# Patient Record
Sex: Male | Born: 1950 | Race: White | Hispanic: No | Marital: Married | Smoking: Former smoker
Health system: Southern US, Community
[De-identification: ages and names within clinical notes are randomized; demographics above are authoritative.]

## PROBLEM LIST (undated history)

## (undated) DIAGNOSIS — Z8739 Personal history of other diseases of the musculoskeletal system and connective tissue: Secondary | ICD-10-CM

## (undated) DIAGNOSIS — I739 Peripheral vascular disease, unspecified: Secondary | ICD-10-CM

## (undated) DIAGNOSIS — E785 Hyperlipidemia, unspecified: Secondary | ICD-10-CM

## (undated) DIAGNOSIS — I779 Disorder of arteries and arterioles, unspecified: Secondary | ICD-10-CM

## (undated) DIAGNOSIS — M4035 Flatback syndrome, thoracolumbar region: Secondary | ICD-10-CM

## (undated) DIAGNOSIS — I2510 Atherosclerotic heart disease of native coronary artery without angina pectoris: Secondary | ICD-10-CM

## (undated) DIAGNOSIS — Z8673 Personal history of transient ischemic attack (TIA), and cerebral infarction without residual deficits: Secondary | ICD-10-CM

## (undated) DIAGNOSIS — M48061 Spinal stenosis, lumbar region without neurogenic claudication: Secondary | ICD-10-CM

## (undated) DIAGNOSIS — I4892 Unspecified atrial flutter: Secondary | ICD-10-CM

## (undated) DIAGNOSIS — I480 Paroxysmal atrial fibrillation: Secondary | ICD-10-CM

## (undated) HISTORY — DX: Hyperlipidemia, unspecified: E78.5

## (undated) HISTORY — PX: CARDIAC CATHETERIZATION: SHX172

## (undated) HISTORY — DX: Peripheral vascular disease, unspecified: I73.9

## (undated) HISTORY — PX: BILATERAL CARPAL TUNNEL RELEASE: SHX6508

## (undated) HISTORY — DX: Atherosclerotic heart disease of native coronary artery without angina pectoris: I25.10

## (undated) HISTORY — DX: Unspecified atrial flutter: I48.92

## (undated) HISTORY — DX: Flatback syndrome, thoracolumbar region: M40.35

## (undated) HISTORY — DX: Spinal stenosis, lumbar region without neurogenic claudication: M48.061

## (undated) HISTORY — DX: Paroxysmal atrial fibrillation: I48.0

## (undated) HISTORY — DX: Personal history of other diseases of the musculoskeletal system and connective tissue: Z87.39

## (undated) HISTORY — DX: Personal history of transient ischemic attack (TIA), and cerebral infarction without residual deficits: Z86.73

## (undated) HISTORY — DX: Disorder of arteries and arterioles, unspecified: I77.9

## (undated) NOTE — *Deleted (*Deleted)
Patient case delayed due to emergent bring back per Dr. Zenaida Niece Trigt's order.

---

## 2008-06-11 ENCOUNTER — Ambulatory Visit: Payer: Self-pay | Admitting: Rheumatology

## 2009-01-19 ENCOUNTER — Inpatient Hospital Stay: Payer: Self-pay | Admitting: Internal Medicine

## 2009-11-29 DIAGNOSIS — I2510 Atherosclerotic heart disease of native coronary artery without angina pectoris: Secondary | ICD-10-CM

## 2009-11-29 HISTORY — PX: CORONARY ANGIOPLASTY WITH STENT PLACEMENT: SHX49

## 2009-11-29 HISTORY — DX: Atherosclerotic heart disease of native coronary artery without angina pectoris: I25.10

## 2010-08-07 ENCOUNTER — Inpatient Hospital Stay: Payer: Self-pay | Admitting: Internal Medicine

## 2010-08-11 ENCOUNTER — Inpatient Hospital Stay: Payer: Self-pay | Admitting: Internal Medicine

## 2010-09-21 ENCOUNTER — Ambulatory Visit: Payer: Self-pay | Admitting: Internal Medicine

## 2011-02-22 ENCOUNTER — Other Ambulatory Visit: Payer: Self-pay | Admitting: Rheumatology

## 2014-01-28 ENCOUNTER — Ambulatory Visit: Payer: Self-pay

## 2014-06-05 DIAGNOSIS — M48061 Spinal stenosis, lumbar region without neurogenic claudication: Secondary | ICD-10-CM | POA: Diagnosis present

## 2014-06-05 DIAGNOSIS — M4035 Flatback syndrome, thoracolumbar region: Secondary | ICD-10-CM | POA: Insufficient documentation

## 2015-02-25 ENCOUNTER — Ambulatory Visit: Payer: Self-pay | Admitting: Internal Medicine

## 2015-03-27 DIAGNOSIS — M1990 Unspecified osteoarthritis, unspecified site: Secondary | ICD-10-CM | POA: Diagnosis present

## 2015-03-27 DIAGNOSIS — Z8739 Personal history of other diseases of the musculoskeletal system and connective tissue: Secondary | ICD-10-CM | POA: Insufficient documentation

## 2015-03-27 DIAGNOSIS — I10 Essential (primary) hypertension: Secondary | ICD-10-CM | POA: Diagnosis present

## 2015-03-27 DIAGNOSIS — Z8673 Personal history of transient ischemic attack (TIA), and cerebral infarction without residual deficits: Secondary | ICD-10-CM | POA: Insufficient documentation

## 2015-04-01 ENCOUNTER — Ambulatory Visit (INDEPENDENT_AMBULATORY_CARE_PROVIDER_SITE_OTHER): Payer: BLUE CROSS/BLUE SHIELD | Admitting: Cardiovascular Disease

## 2015-04-01 ENCOUNTER — Encounter: Payer: Self-pay | Admitting: Cardiovascular Disease

## 2015-04-01 VITALS — BP 142/88 | HR 115 | Ht 71.0 in | Wt 244.0 lb

## 2015-04-01 DIAGNOSIS — I6523 Occlusion and stenosis of bilateral carotid arteries: Secondary | ICD-10-CM

## 2015-04-01 DIAGNOSIS — M5441 Lumbago with sciatica, right side: Secondary | ICD-10-CM | POA: Diagnosis not present

## 2015-04-01 DIAGNOSIS — M5442 Lumbago with sciatica, left side: Secondary | ICD-10-CM

## 2015-04-01 DIAGNOSIS — I6529 Occlusion and stenosis of unspecified carotid artery: Secondary | ICD-10-CM | POA: Insufficient documentation

## 2015-04-01 DIAGNOSIS — I482 Chronic atrial fibrillation, unspecified: Secondary | ICD-10-CM | POA: Insufficient documentation

## 2015-04-01 DIAGNOSIS — I2510 Atherosclerotic heart disease of native coronary artery without angina pectoris: Secondary | ICD-10-CM

## 2015-04-01 DIAGNOSIS — Z125 Encounter for screening for malignant neoplasm of prostate: Secondary | ICD-10-CM | POA: Diagnosis not present

## 2015-04-01 DIAGNOSIS — M545 Low back pain, unspecified: Secondary | ICD-10-CM | POA: Insufficient documentation

## 2015-04-01 DIAGNOSIS — I4891 Unspecified atrial fibrillation: Secondary | ICD-10-CM

## 2015-04-01 DIAGNOSIS — E785 Hyperlipidemia, unspecified: Secondary | ICD-10-CM | POA: Diagnosis not present

## 2015-04-01 MED ORDER — DILTIAZEM HCL 30 MG PO TABS: 30.0000 mg | ORAL_TABLET | Freq: Three times a day (TID) | ORAL | Status: DC

## 2015-04-01 NOTE — Assessment & Plan Note (Addendum)
Currently is relatively asymptomatic despite being in atrial fibrillation with rate over 100 bpm. Denies heart failure symptoms and appears relatively euvolemic on today's visit. We have suggested he try other medications for rate control before he consider ablation. He would likely be high risk of recurrent atrial fibrillation given dilated left atrium, moderate MR. Unable to exclude sleep apnea. We have recommended he start diltiazem 30 mg 3 times a day if tolerated. If tolerated, could change to extended release Cardizem 120 mg daily If unable to tolerate calcium channel blocker, could try digoxin. Recommended he stay on anticoagulation given prior history of TIA

## 2015-04-01 NOTE — Assessment & Plan Note (Signed)
He has follow-up at [facility] back clinic. Unclear. Would be a surgical candidate given general anesthesia could potentially lead to hypotension and given his bilateral carotid disease, increased risk of stroke or TIA. Unclear if epidural could be considered if surgery possible.

## 2015-04-01 NOTE — Progress Notes (Addendum)
Patient ID: Jeremy Reese, male    DOB: 02-06-1951, 64 y.o.   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 Lucky Cowboy, atrial fibrillation dating back to 2011 who presents for new patient evaluation for his atrial fibrillation. He reports having severe back pain and is not a surgical candidate per the surgeons given his severe bilateral carotid disease. He has a history of coronary artery disease, cardiac catheterization 01/20/2009 with 95% proximal circumflex, 60% mid LAD. PCI of the left circumflex vessel  In terms of his atrial fibrillation history, briefly, he reports having initial onset of atrial fibrillation in 2011.  He had cardioversion at that time which was relatively unsuccessful in maintaining normal sinus rhythm. He was started on tiksoyn and per his report, converted to normal sinus rhythm. He was maintained on this medication for many years but was found to be in atrial fibrillation in early 2016. He underwent cardioversion but converted back to atrial fibrillation shortly after. He was recommended he start metoprolol 25 mg twice a day.  He reports having significant fatigue, weakness, and shortness of breath and has since stopped all of his cardiac medications except for his anticoagulation and cholesterol medication.  In follow-up today, he reports that he feels well with no significant shortness of breath. In fact he feels the best that he has felt in a long time on little medication.  In the past he has tried higher dose pravastatin 80 mg and had side effects including fatigue, weakness, muscle ache. He is able to tolerate 40 mg daily.  In terms of his other medical issues, he has a "flatback" and per his report has spinal stenosis, scoliosis and chronic pain. He is scheduled to see [facility] back clinic. He reports tramadol helps to some  degree but still has significant breakthrough pain. He is very limited in his ability to exert himself or ambulate secondary to his back.  Carotid ultrasound reviewed with him from 01/14/2015 showing bilateral internal carotid occlusion, no significant change from ultrasound dated June 2015  Prior studies show cardioversion 02/25/2015 Transesophageal echo 08/11/2010 with PFO noted, ejection fraction 50%, left atrium moderately dilated, moderate MR, mild to moderate TR  EKG on today's visit shows atrial fibrillation with ventricular rate 115 bpm, no significant ST or T-wave changes  Allergies  Allergen Reactions  . Propafenone Anxiety  . Codeine Nausea Only  . Penicillins Rash     Current outpatient prescriptions:  .  apixaban (ELIQUIS) 5 MG TABS tablet, Take 5 mg by mouth 2 (two) times daily., Disp: , Rfl:  .  calcium gluconate 500 MG tablet, Take 1 tablet by mouth daily., Disp: , Rfl:  .  colchicine 0.6 MG tablet, Take 0.6 mg by mouth 2 (two) times daily as needed., Disp: , Rfl:  .  Flaxseed, Linseed, (FLAXSEED OIL) 1200 MG CAPS, Take 1,200 mg by mouth 3 (three) times daily., Disp: , Rfl:  .  gabapentin (NEURONTIN) 800 MG tablet, Take 800 mg by mouth 3 (three) times daily., Disp: , Rfl:  .  Magnesium Gluconate 250 MG TABS, Take 250 mg by mouth daily., Disp: , Rfl:  .  Melatonin 5 MG TABS, Take 5 mg by mouth daily., Disp: , Rfl:  .  pravastatin (PRAVACHOL) 40 MG tablet, Take 40 mg by mouth daily., Disp: , Rfl:  .  traMADol (ULTRAM) 50 MG tablet, Take 100  mg by mouth every 6 (six) hours as needed., Disp: , Rfl:  .  diltiazem (CARDIZEM) 30 MG tablet, Take 1 tablet (30 mg total) by mouth 3 (three) times daily., Disp: 90 tablet, Rfl: 6   Past Medical History  Diagnosis Date  . Paroxysmal a-fib   . Carotid artery disease without cerebral infarction   . Coronary artery disease 2011  . History of TIA (transient ischemic attack)   . Flatback syndrome of thoracolumbar region   . H/O  calcium pyrophosphate deposition disease (CPPD)   . Spinal stenosis of lumbar region without neurogenic claudication   . Hyperlipidemia   . Atrial flutter, paroxysmal     Past Surgical History  Procedure Laterality Date  . Cardiac catheterization    . Coronary angioplasty with stent placement  2011  . Bilateral carpal tunnel release    . Circumcision, non-newborn      Social History  reports that he has quit smoking. His smoking use included Cigarettes. He has a 10 pack-year smoking history. He does not have any smokeless tobacco history on file. He reports that he drinks alcohol. He reports that he does not use illicit drugs.  Family History family history includes Heart disease (age of onset: 45) in his mother; Hypertension in his father.  Review of Systems  Constitutional: Negative.   Respiratory: Negative.   Cardiovascular: Negative.   Gastrointestinal: Negative.   Musculoskeletal: Positive for back pain, arthralgias and gait problem.  Skin: Negative.   Neurological: Negative.   Hematological: Negative.   Psychiatric/Behavioral: Negative.   All other systems reviewed and are negative.   BP 142/88 mmHg  Pulse 115  Ht 5\' 11"  (1.803 m)  Wt 244 lb (110.678 kg)  BMI 34.05 kg/m2  Physical Exam  Constitutional: He is oriented to person, place, and time. He appears well-developed and well-nourished.  Difficulty ambulating chair to table secondary to back pain Obese  HENT:  Head: Normocephalic.  Nose: Nose normal.  Mouth/Throat: Oropharynx is clear and moist.  Eyes: Conjunctivae are normal. Pupils are equal, round, and reactive to light.  Neck: Normal range of motion. Neck supple. No JVD present.  Cardiovascular: Normal rate, regular rhythm, S1 normal, S2 normal, normal heart sounds and intact distal pulses.  Exam reveals no gallop and no friction rub.   No murmur heard. Pulmonary/Chest: Effort normal and breath sounds normal. No respiratory distress. He has no wheezes.  He has no rales. He exhibits no tenderness.  Abdominal: Soft. Bowel sounds are normal. He exhibits no distension. There is no tenderness.  Musculoskeletal: Normal range of motion. He exhibits no edema or tenderness.  Lymphadenopathy:    He has no cervical adenopathy.  Neurological: He is alert and oriented to person, place, and time. Coordination normal.  Skin: Skin is warm and dry. No rash noted. No erythema.  Psychiatric: He has a normal mood and affect. His behavior is normal. Judgment and thought content normal.      Assessment and Plan   Nursing note and vitals reviewed.

## 2015-04-01 NOTE — Assessment & Plan Note (Signed)
Currently with no symptoms of angina. No further workup at this time. Continue current medication regimen. 

## 2015-04-01 NOTE — Assessment & Plan Note (Addendum)
We will arrange cholesterol check at his convenience, fasting Goal LDL less than 70 If above goal, would add zetia were changed to alternate statin. Crestor will be going generic next month

## 2015-04-01 NOTE — Patient Instructions (Addendum)
You are doing well. Heart rate is mildly elevated  Please start diltiazem one pill three times a day for heart rate control  If tolerated, please call the office, We would start the extended release pill  If not tolerated, We could do a trial of digoxin  Please come in for lipids and PSA  (Fasting)  Please call us if you have new issues that need to be addressed before your next appt.  Your physician wants you to follow-up in: 3 months.  You will receive a reminder letter in the mail two months in advance. If you don't receive a letter, please call our office to schedule the follow-up appointment.

## 2015-04-01 NOTE — Assessment & Plan Note (Signed)
Followed by Dr. Lucky Cowboy. 100% occlusion of bilateral internal carotids

## 2015-04-02 ENCOUNTER — Other Ambulatory Visit (INDEPENDENT_AMBULATORY_CARE_PROVIDER_SITE_OTHER): Payer: BLUE CROSS/BLUE SHIELD | Admitting: *Deleted

## 2015-04-02 DIAGNOSIS — E785 Hyperlipidemia, unspecified: Secondary | ICD-10-CM | POA: Diagnosis not present

## 2015-04-02 DIAGNOSIS — Z125 Encounter for screening for malignant neoplasm of prostate: Secondary | ICD-10-CM

## 2015-04-03 LAB — PSA: PROSTATE SPECIFIC AG, SERUM: 1.1 ng/mL (ref 0.0–4.0)

## 2015-04-03 LAB — LIPID PANEL
CHOL/HDL RATIO: 3.9 ratio (ref 0.0–5.0)
Cholesterol, Total: 147 mg/dL (ref 100–199)
HDL: 38 mg/dL — ABNORMAL LOW (ref >39)
LDL Calculated: 95 mg/dL (ref 0–99)
Triglycerides: 70 mg/dL (ref 0–149)
VLDL Cholesterol Cal: 14 mg/dL (ref 5–40)

## 2015-04-14 ENCOUNTER — Telehealth: Payer: Self-pay | Admitting: *Deleted

## 2015-04-14 NOTE — Telephone Encounter (Signed)
Pt c/o medication issue:  1. Name of Medication: Diltiazem   2. How are you currently taking this medication (dosage and times per day)? 3 times a day 30 mg   3. Are you having a reaction (difficulty breathing--STAT)? Just tired and "SOB nothing really major it bothers him right when he goes to bed." " once in a while, when he watches tv" comes and goes.   4. What is your medication issue? Dr Rockey Situ suggested that pt may need to try another medication if this one does not work and or if he doesn't feel well.  He mention Digoxin. He would like to be able to get samples. If doctor says this is okay to try another medication.   Please advise

## 2015-04-16 NOTE — Telephone Encounter (Signed)
Would change diltiazem 30 mg pills to Cardizem ER 120 mg daily Digoxin is only needed if heart rate continues to run fast on the Cardizem We do not have samples and would likely not be able to control his heart rate by itself

## 2015-04-16 NOTE — Telephone Encounter (Signed)
Left message for pt to call back  °

## 2015-04-17 MED ORDER — DILTIAZEM HCL ER COATED BEADS 120 MG PO CP24: 120.0000 mg | ORAL_CAPSULE | Freq: Every day | ORAL | Status: DC

## 2015-04-17 NOTE — Telephone Encounter (Signed)
Spoke w/ pt.  Advised him of Dr. Gollan's recommendation.  He verbalizes understanding and will call back w/ any questions or concerns.  

## 2015-04-29 ENCOUNTER — Telehealth: Payer: Self-pay | Admitting: *Deleted

## 2015-04-29 ENCOUNTER — Encounter: Payer: Self-pay | Admitting: Cardiovascular Disease

## 2015-04-29 ENCOUNTER — Ambulatory Visit (INDEPENDENT_AMBULATORY_CARE_PROVIDER_SITE_OTHER): Payer: BLUE CROSS/BLUE SHIELD | Admitting: Cardiovascular Disease

## 2015-04-29 VITALS — BP 140/80 | HR 107 | Ht 70.5 in | Wt 246.5 lb

## 2015-04-29 DIAGNOSIS — I2510 Atherosclerotic heart disease of native coronary artery without angina pectoris: Secondary | ICD-10-CM

## 2015-04-29 DIAGNOSIS — R0602 Shortness of breath: Secondary | ICD-10-CM | POA: Diagnosis not present

## 2015-04-29 DIAGNOSIS — R5383 Other fatigue: Secondary | ICD-10-CM | POA: Diagnosis not present

## 2015-04-29 DIAGNOSIS — I5031 Acute diastolic (congestive) heart failure: Secondary | ICD-10-CM

## 2015-04-29 DIAGNOSIS — I4891 Unspecified atrial fibrillation: Secondary | ICD-10-CM

## 2015-04-29 DIAGNOSIS — I5032 Chronic diastolic (congestive) heart failure: Secondary | ICD-10-CM | POA: Insufficient documentation

## 2015-04-29 DIAGNOSIS — E785 Hyperlipidemia, unspecified: Secondary | ICD-10-CM

## 2015-04-29 MED ORDER — NITROGLYCERIN 0.4 MG SL SUBL: 0.4000 mg | SUBLINGUAL_TABLET | SUBLINGUAL | Status: DC | PRN

## 2015-04-29 MED ORDER — FUROSEMIDE 20 MG PO TABS: 20.0000 mg | ORAL_TABLET | Freq: Every day | ORAL | Status: DC | PRN

## 2015-04-29 MED ORDER — POTASSIUM CHLORIDE ER 10 MEQ PO TBCR: 10.0000 meq | EXTENDED_RELEASE_TABLET | Freq: Every day | ORAL | Status: DC | PRN

## 2015-04-29 NOTE — Assessment & Plan Note (Signed)
Heart rate continues to be elevated. He has side effect on the diltiazem with leg swelling We will stop the diltiazem, restart metoprolol 50 mg twice a day. He denies having fatigue on metoprolol If needed, metoprolol dose could be increased or digoxin added for rate control

## 2015-04-29 NOTE — Telephone Encounter (Signed)
Pt c/o medication issue:  1. Name of Medication: Diltiezem   2. How are you currently taking this medication (dosage and times per day)? Once a day  3. Are you having a reaction (difficulty breathing--STAT)?   4. What is your medication issue? Pt still having or fatigue and little bit of SOB and it is now giving pt skin rash Would like to know what can he take now to slow his heart rate

## 2015-04-29 NOTE — Progress Notes (Signed)
Patient ID: Jeremy Reese, male    DOB: [birth year], 64 y.o.   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 Lucky Cowboy, atrial fibrillation dating back to 2011 who presents for follow-up of his atrial fibrillation  severe back pain and is not a surgical candidate per the surgeons given his severe bilateral carotid disease. He has a history of coronary artery disease, cardiac catheterization 01/20/2009 with 95% proximal circumflex, 60% mid LAD. PCI of the left circumflex vessel  In follow-up today, he reports having worsening lower extremity edema Also waking up at nighttime with shortness of breath, has to sit in the recliner to sleep. Breathless after walking to the bathroom in the middle of the night. He is not monitoring his heart rate at home. Denies having any side effects of the diltiazem but does have new leg swelling bilaterally. Drinks lots of fluids including water, beer  He attributes previous fatigue to taking tikosyn and metoprolol together. No fatigue on metoprolol along  EKG on today's visit shows atrial fibrillation with ventricular rate 107 bpm Recent LDL 95, total cholesterol 148. This was discussed with him  In the past he has tried higher dose pravastatin 80 mg and had side effects including fatigue, weakness, muscle ache. He is able to tolerate 40 mg daily.  In terms of his other medical issues, he has a "flatback" and per his report has spinal stenosis, scoliosis and chronic pain. He is scheduled to see [facility] back clinic. He reports tramadol helps to some degree but still has significant breakthrough pain. He is very limited in his ability to exert himself or ambulate secondary to his back.  Carotid ultrasound reviewed with him from 01/14/2015 showing bilateral internal carotid occlusion, no significant change from ultrasound dated  June 2015  Prior studies show cardioversion 02/25/2015 Transesophageal echo 08/11/2010 with PFO noted, ejection fraction 50%, left atrium moderately dilated, moderate MR, mild to moderate TR   Allergies  Allergen Reactions  . Propafenone Anxiety  . Codeine Nausea Only  . Penicillins Rash     Current outpatient prescriptions:  .  apixaban (ELIQUIS) 5 MG TABS tablet, Take 5 mg by mouth 2 (two) times daily., Disp: , Rfl:  .  calcium gluconate 500 MG tablet, Take 1 tablet by mouth daily., Disp: , Rfl:  .  colchicine 0.6 MG tablet, Take 0.6 mg by mouth 2 (two) times daily as needed., Disp: , Rfl:  .  Flaxseed, Linseed, (FLAXSEED OIL) 1200 MG CAPS, Take 1,200 mg by mouth 3 (three) times daily., Disp: , Rfl:  .  gabapentin (NEURONTIN) 800 MG tablet, Take 800 mg by mouth 3 (three) times daily., Disp: , Rfl:  .  Magnesium Gluconate 250 MG TABS, Take 250 mg by mouth daily., Disp: , Rfl:  .  Melatonin 5 MG TABS, Take 5 mg by mouth daily., Disp: , Rfl:  .  pravastatin (PRAVACHOL) 40 MG tablet, Take 40 mg by mouth daily., Disp: , Rfl:  .  traMADol (ULTRAM) 50 MG tablet, Take 100 mg by mouth every 6 (six) hours as needed., Disp: , Rfl:  .  furosemide (LASIX) 20 MG tablet, Take 1 tablet (20 mg total) by mouth daily as needed., Disp: 90 tablet, Rfl: 3 .  metoprolol (LOPRESSOR) 50 MG tablet, Take 1 tablet (50 mg total) by mouth 2 (two) times daily., Disp: 180 tablet, Rfl: 3 .  nitroGLYCERIN (NITROSTAT) 0.4 MG SL tablet, Place 1 tablet (0.4 mg total) under the tongue every 5 (five) minutes as needed for chest pain., Disp: 25 tablet, Rfl: 3 .  potassium chloride (K-DUR) 10 MEQ tablet, Take 1 tablet (10 mEq total) by mouth daily as needed., Disp: 90 tablet, Rfl: 6   Past Medical History  Diagnosis Date  . Paroxysmal a-fib   . Carotid artery disease without cerebral infarction   . Coronary artery disease 2011  . History of TIA (transient ischemic attack)   . Flatback syndrome of thoracolumbar region    . H/O calcium pyrophosphate deposition disease (CPPD)   . Spinal stenosis of lumbar region without neurogenic claudication   . Hyperlipidemia   . Atrial flutter, paroxysmal     Past Surgical History  Procedure Laterality Date  . Cardiac catheterization    . Coronary angioplasty with stent placement  2011  . Bilateral carpal tunnel release    . Circumcision, non-newborn      Social History  reports that he has quit smoking. His smoking use included Cigarettes. He has a 10 pack-year smoking history. He does not have any smokeless tobacco history on file. He reports that he drinks alcohol. He reports that he does not use illicit drugs.  Family History family history includes Heart disease (age of onset: 88) in his mother; Hypertension in his father.  Review of Systems  Constitutional: Negative.   Respiratory: Positive for shortness of breath.   Cardiovascular: Positive for leg swelling.  Musculoskeletal: Positive for back pain, arthralgias and gait problem.  Skin: Negative.   Neurological: Negative.   Hematological: Negative.   Psychiatric/Behavioral: Negative.   All other systems reviewed and are negative.   BP 140/80 mmHg  Pulse 107  Ht 5' 10.5" (1.791 m)  Wt 246 lb 8 oz (111.812 kg)  BMI 34.86 kg/m2  Physical Exam  Constitutional: He is oriented to person, place, and time. He appears well-developed and well-nourished.  Difficulty ambulating chair to table secondary to back pain Obese  HENT:  Head: Normocephalic.  Nose: Nose normal.  Mouth/Throat: Oropharynx is clear and moist.  Eyes: Conjunctivae are normal. Pupils are equal, round, and reactive to light.  Neck: Normal range of motion. Neck supple. No JVD present.  Cardiovascular: Normal rate, regular rhythm, S1 normal, S2 normal, normal heart sounds and intact distal pulses.  Exam reveals no gallop and no friction rub.   No murmur heard. Trace edema to the mid shins, pitting  Pulmonary/Chest: Effort normal and  breath sounds normal. No respiratory distress. He has no wheezes. He has no rales. He exhibits no tenderness.  Abdominal: Soft. Bowel sounds are normal. He exhibits no distension. There is no tenderness.  Musculoskeletal: Normal range of motion. He exhibits edema. He exhibits no tenderness.  Lymphadenopathy:    He has no cervical adenopathy.  Neurological: He is alert and oriented to person, place, and time. Coordination normal.  Skin: Skin is warm and dry. No rash noted. No erythema.  Psychiatric: He has a normal mood and affect. His behavior is normal. Judgment and thought content normal.      Assessment and Plan   Nursing note and vitals reviewed.

## 2015-04-29 NOTE — Telephone Encounter (Signed)
Spoke w/ pt.  He reports continued weakness since med change and now has a rash on his legs.  He reports that no one has looked at his rash.  Pt sched to come in this afternoon @ 2:30.

## 2015-04-29 NOTE — Patient Instructions (Addendum)
You have leg swelling for the diltiazem Please stop the diltiazem  Restart metoprolol 50 mg twice a day Monitor your heart rate at home  Please take lasix as needed for shortness of breath Take with potassium  Call for any heart blockage symptoms Take nitro as needed If you take nitro frequently, Call the office  Please call us if you have new issues that need to be addressed before your next appt.  Your physician wants you to follow-up in: 3 months.  You will receive a reminder letter in the mail two months in advance. If you don't receive a letter, please call our office to schedule the follow-up appointment.

## 2015-04-29 NOTE — Assessment & Plan Note (Signed)
We will add Lasix with potassium as needed for leg edema, abdominal bloating, shortness of breath at nighttime.  Recommended he cut back on his fluid intake

## 2015-04-29 NOTE — Assessment & Plan Note (Signed)
LDL above goal. This was discussed with him. He does not want to change his statin. Will consider zetia when this goes generic rate of this year

## 2015-04-29 NOTE — Assessment & Plan Note (Signed)
Currently with symptoms of shortness of breath at nighttime concerning for acute diastolic CHF. If symptoms do not improve, should consider ischemia

## 2015-05-05 ENCOUNTER — Telehealth: Payer: Self-pay | Admitting: Cardiovascular Disease

## 2015-05-05 NOTE — Telephone Encounter (Signed)
Reviewed recommendation with patient. Pt states he will take 25mg  metoprolol BID with food and report to [place] if he still continues to feel sick

## 2015-05-05 NOTE — Telephone Encounter (Signed)
Decreased the dose of metoprolol to 25 mg twice daily and see if he is able to tolerate this dose. He takes few days to adjust a new medication.

## 2015-05-05 NOTE — Telephone Encounter (Signed)
°  Pt c/o medication issue:  1. Name of Medication: Metoprolol   2. How are you currently taking this medication (dosage and times per day)? 50 mg x twice dayily  3. Are you having a reaction (difficulty breathing--STAT)? SOB sick on stomach last Tuesday (d/c taking since then)  4. What is your medication issue? Patient thinks he had a reaction and cannot tolerate the metoprolol  Per Patient Rockey Situ said he may try Digoxin trial instead if Metoprolol not effective

## 2015-05-05 NOTE — Telephone Encounter (Signed)
S/w pt who states he started taking metoprolol last Tuesday per Dr. Rockey Situ. He took one dose and felt sick and short of breath.  Stated he was up all Tuesday night with symptoms Has not take metoprolol since 04/29/15 Would like to know if he can take something else as he feels he can't tolerate 50mg  metoprolol BID.  Forward to Dr. Fletcher Anon. Please advise.

## 2015-05-13 ENCOUNTER — Telehealth: Payer: Self-pay

## 2015-05-13 NOTE — Telephone Encounter (Signed)
Pt called states his metoprolol at 50 mg made him "violently ill", states he cannot tolerate the 25 mg. States it made him SOB. States last night. Pt also would like to check his BP machine with ours.  States last night his BP was 152/116, HR was 72. States his head felt like it had a lot of "pressure in his head". Please call.

## 2015-05-13 NOTE — Telephone Encounter (Signed)
Jeremy Reese at 05/13/2015 9:48 AM             Pt called states his metoprolol at 50 mg made him "violently ill", states he cannot tolerate the 25 mg. States it made him SOB. States last night. Pt also would like to check his BP machine with ours. States last night his BP was 152/116, HR was 72. States his head felt like it had a lot of "pressure in his head". Please call.

## 2015-05-13 NOTE — Telephone Encounter (Signed)
We could try an alternate b-blocker such as coreg 6.25 mg po BID Will slowly titrate upwards for heart rate control. Needs to track BP and heart rate at home Stop the metoprolol

## 2015-05-13 NOTE — Telephone Encounter (Signed)
Please see previous phone note.  

## 2015-05-14 ENCOUNTER — Ambulatory Visit (INDEPENDENT_AMBULATORY_CARE_PROVIDER_SITE_OTHER): Payer: BLUE CROSS/BLUE SHIELD

## 2015-05-14 VITALS — BP 182/104

## 2015-05-14 DIAGNOSIS — I10 Essential (primary) hypertension: Secondary | ICD-10-CM

## 2015-05-14 MED ORDER — CARVEDILOL 6.25 MG PO TABS: 6.2500 mg | ORAL_TABLET | Freq: Two times a day (BID) | ORAL | Status: DC

## 2015-05-14 NOTE — Telephone Encounter (Signed)
Spoke w/ pt.  Advised him of Dr. Donivan Scull recommendation.  He verbalizes understanding and is agreeable.  He reports that he has increased his lasix to BID on his own w/ good results. Reports that he slept well last night, which is the first night he has slept well in months.  Advised him to continue to monitor BP & HR and call w/ readings next week.

## 2015-05-14 NOTE — Patient Instructions (Signed)
Your BP is elevated today Please p/u new rx for Coreg and take as soon as you get home  Please try to get a new BP monitor, as your old one is inaccurate  Please monitor BP readings and call the office w/ readings

## 2015-05-14 NOTE — Progress Notes (Signed)
1.) Reason for visit: BP check  2.) Name of MD requesting visit: Gollan  3.) H&P: Pt's metoprolol was making him sick, has not taken any BP meds for the past week.  Coreg 6.25 mg sent in this am, but pt has not picked up yet.   4.) ROS related to problem: Manual BP 182/104, reading on pt's monitor 147/115.  Pt reports that his mother passed away in 2002-03-20 and the monitor belonged to her.  Advised him that the readings are drastically different and suggested he obtain a newer BP monitor and call the office w/ readings.  Advised him to p/u new rx for Coreg from his pharmacy and take as soon as he gets home, to weigh himself daily, monitor his BP & HR and call the office w/ readings.   5.) Assessment and plan per MD: Please see pt's previous phone note.

## 2015-06-17 ENCOUNTER — Other Ambulatory Visit: Payer: Self-pay | Admitting: Cardiovascular Disease

## 2015-06-17 ENCOUNTER — Other Ambulatory Visit: Payer: Self-pay | Admitting: *Deleted

## 2015-06-17 MED ORDER — FUROSEMIDE 20 MG PO TABS: ORAL_TABLET | ORAL | Status: DC

## 2015-07-30 ENCOUNTER — Encounter: Payer: Self-pay | Admitting: Cardiovascular Disease

## 2015-07-30 ENCOUNTER — Ambulatory Visit (INDEPENDENT_AMBULATORY_CARE_PROVIDER_SITE_OTHER): Payer: BLUE CROSS/BLUE SHIELD | Admitting: Cardiovascular Disease

## 2015-07-30 VITALS — BP 142/70 | HR 106 | Ht 70.5 in | Wt 223.5 lb

## 2015-07-30 DIAGNOSIS — I5031 Acute diastolic (congestive) heart failure: Secondary | ICD-10-CM | POA: Diagnosis not present

## 2015-07-30 DIAGNOSIS — E785 Hyperlipidemia, unspecified: Secondary | ICD-10-CM | POA: Diagnosis not present

## 2015-07-30 DIAGNOSIS — I4891 Unspecified atrial fibrillation: Secondary | ICD-10-CM

## 2015-07-30 DIAGNOSIS — I6523 Occlusion and stenosis of bilateral carotid arteries: Secondary | ICD-10-CM

## 2015-07-30 MED ORDER — DIGOXIN 250 MCG PO TABS: 0.2500 mg | ORAL_TABLET | Freq: Every day | ORAL | Status: DC

## 2015-07-30 MED ORDER — ROSUVASTATIN CALCIUM 40 MG PO TABS: 40.0000 mg | ORAL_TABLET | Freq: Every day | ORAL | Status: DC

## 2015-07-30 NOTE — Assessment & Plan Note (Signed)
He appears relatively euvolemic on today's visit He does report having fatigue on beta blocker Suggested he decrease the dose down to 6.25 mg twice a day

## 2015-07-30 NOTE — Patient Instructions (Signed)
You are doing well.  Please hold the pravastatin, Start 1/2 crestor titrating upwards to a full pill after one month  Start digoxin one a day for heart rate If heart rate ok, Ok to decrease coreg down to 1/2 pill twice a day  Consider decreasing the furosemide down to one a day But monitor your weight Higher weight, more lasix  LABS today, BMP  Please call us if you have new issues that need to be addressed before your next appt.  Your physician wants you to follow-up in: 6 months.  You will receive a reminder letter in the mail two months in advance. If you don't receive a letter, please call our office to schedule the follow-up appointment.

## 2015-07-30 NOTE — Assessment & Plan Note (Addendum)
Long discussion concerning his cholesterol. Recommended he change the pravastatin to Crestor 40 mg daily in an attempt to decrease the LDL to 70 or less

## 2015-07-30 NOTE — Assessment & Plan Note (Signed)
Bilateral occlusion of internal carotid arteries. Stressed the importance of aggressive cholesterol management

## 2015-07-30 NOTE — Progress Notes (Signed)
Patient ID: Jeremy Reese, male    DOB: Dec 10, 1950, 64 y.o.   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 He has a history of coronary artery disease, cardiac catheterization 01/20/2009 with 95% proximal circumflex, 60% mid LAD. PCI of the left circumflex vessel  In follow-up today, he reports that he has been taking carvedilol 12 .5 mg twice a day He did not tolerate metoprolol secondary to fatigue  Without diltiazem and on Lasix he has had a 20 pound weight loss since 2 months ago Overall feels better, no significant CHF symptoms, leg edema has resolved He does report continued fatigue on the beta blocker  EKG on today's visit shows atrial fibrillation with ventricular rate 10 6 bpm Recent LDL 95, total cholesterol 148. Results discussed with him, recommended LDL less than 70  Other past medical history In the past he has tried higher dose pravastatin 80 mg and had side effects including fatigue, weakness, muscle ache. He is able to tolerate 40 mg daily.  In terms of his other medical issues, he has a "flatback" and per his report has spinal stenosis, scoliosis and chronic pain. He is scheduled to see [facility] back clinic. He reports tramadol helps to some degree but still has significant breakthrough pain. He is very limited in his ability to exert himself or ambulate secondary to his back.  Carotid ultrasound reviewed with him from 01/14/2015 showing bilateral internal carotid occlusion, no significant change from ultrasound dated June 2015  Prior studies show cardioversion 02/25/2015 Transesophageal echo 08/11/2010 with PFO noted,  ejection fraction 50%, left atrium moderately dilated, moderate MR, mild to moderate TR   Allergies  Allergen Reactions  . Propafenone Anxiety  . Codeine Nausea Only  . Penicillins Rash     Current outpatient prescriptions:  .  apixaban (ELIQUIS) 5 MG TABS tablet, Take 5 mg by mouth 2 (two) times daily., Disp: , Rfl:  .  calcium gluconate 500 MG tablet, Take 1 tablet by mouth daily., Disp: , Rfl:  .  carvedilol (COREG) 6.25 MG tablet, Take 1 tablet (6.25 mg total) by mouth 2 (two) times daily. (Patient taking differently: Take 12.5 mg by mouth 2 (two) times daily. ), Disp: 60 tablet, Rfl: 6 .  colchicine 0.6 MG tablet, Take 0.6 mg by mouth 2 (two) times daily as needed., Disp: , Rfl:  .  Flaxseed, Linseed, (FLAXSEED OIL) 1200 MG CAPS, Take 1,200 mg by mouth 3 (three) times daily., Disp: , Rfl:  .  furosemide (LASIX) 20 MG tablet, TAKE TWO TABLET A DAY AS NEEDED, Disp: 180 tablet, Rfl: 3 .  gabapentin (NEURONTIN) 800 MG tablet, Take 800 mg by mouth 3 (three) times daily., Disp: , Rfl:  .  Magnesium Gluconate 250 MG TABS, Take 250 mg by mouth daily., Disp: , Rfl:  .  Melatonin 5 MG TABS, Take 5 mg by mouth daily., Disp: , Rfl:  .  nitroGLYCERIN (NITROSTAT) 0.4 MG SL tablet, Place 1 tablet (0.4 mg total) under the tongue every 5 (five) minutes as needed for chest pain., Disp: 25 tablet, Rfl: 3 .  potassium chloride (K-DUR) 10 MEQ tablet, Take 1 tablet (10 mEq total) by  mouth daily as needed., Disp: 90 tablet, Rfl: 6 .  traMADol (ULTRAM) 50 MG tablet, Take 100 mg by mouth every 6 (six) hours as needed., Disp: , Rfl:  .  digoxin (LANOXIN) 0.25 MG tablet, Take 1 tablet (0.25 mg total) by mouth daily., Disp: 30 tablet, Rfl: 11 .  rosuvastatin (CRESTOR) 40 MG tablet, Take 1 tablet (40 mg total) by mouth daily., Disp: 30 tablet, Rfl: 11   Past Medical History  Diagnosis Date  . Paroxysmal a-fib   . Carotid artery disease without cerebral infarction   . Coronary artery disease 2011  . History  of TIA (transient ischemic attack)   . Flatback syndrome of thoracolumbar region   . H/O calcium pyrophosphate deposition disease (CPPD)   . Spinal stenosis of lumbar region without neurogenic claudication   . Hyperlipidemia   . Atrial flutter, paroxysmal     Past Surgical History  Procedure Laterality Date  . Cardiac catheterization    . Coronary angioplasty with stent placement  2011  . Bilateral carpal tunnel release    . Circumcision, non-newborn      Social History  reports that he has quit smoking. His smoking use included Cigarettes. He has a 10 pack-year smoking history. He does not have any smokeless tobacco history on file. He reports that he drinks alcohol. He reports that he does not use illicit drugs.  Family History family history includes Heart disease (age of onset: 71) in his mother; Hypertension in his father.  Review of Systems  Constitutional: Negative.   Respiratory: Negative.   Cardiovascular: Negative.   Musculoskeletal: Positive for back pain, arthralgias and gait problem.  Skin: Negative.   Neurological: Negative.   Hematological: Negative.   Psychiatric/Behavioral: Negative.   All other systems reviewed and are negative.   BP 142/70 mmHg  Pulse 106  Ht 5' 10.5" (1.791 m)  Wt 223 lb 8 oz (101.379 kg)  BMI 31.61 kg/m2  Physical Exam  Constitutional: He is oriented to person, place, and time. He appears well-developed and well-nourished.  Difficulty ambulating chair to table secondary to back pain Obese  HENT:  Head: Normocephalic.  Nose: Nose normal.  Mouth/Throat: Oropharynx is clear and moist.  Eyes: Conjunctivae are normal. Pupils are equal, round, and reactive to light.  Neck: Normal range of motion. Neck supple. No JVD present.  Cardiovascular: S1 normal, S2 normal, normal heart sounds and intact distal pulses.  An irregularly irregular rhythm present. Tachycardia present.  Exam reveals no gallop and no friction rub.   No murmur  heard. Trace edema to the mid shins, pitting  Pulmonary/Chest: Effort normal and breath sounds normal. No respiratory distress. He has no wheezes. He has no rales. He exhibits no tenderness.  Abdominal: Soft. Bowel sounds are normal. He exhibits no distension. There is no tenderness.  Musculoskeletal: Normal range of motion. He exhibits no edema or tenderness.  Lymphadenopathy:    He has no cervical adenopathy.  Neurological: He is alert and oriented to person, place, and time. Coordination normal.  Skin: Skin is warm and dry. No rash noted. No erythema.  Psychiatric: He has a normal mood and affect. His behavior is normal. Judgment and thought content normal.      Assessment and Plan   Nursing note and vitals reviewed.

## 2015-07-30 NOTE — Assessment & Plan Note (Signed)
Heart rate continues to run high Recommended he start digoxin 0.25 mg daily, loading dose of 0.5 mg today Decreased beta blocker down to 6.25 mg twice a day only if heart rate stay within a reasonable range We did discuss retrying anti-rhythmic medications.

## 2015-07-31 LAB — BASIC METABOLIC PANEL
BUN / CREAT RATIO: 17 (ref 10–22)
BUN: 22 mg/dL (ref 8–27)
CHLORIDE: 99 mmol/L (ref 97–108)
CO2: 25 mmol/L (ref 18–29)
Calcium: 9.7 mg/dL (ref 8.6–10.2)
Creatinine, Ser: 1.3 mg/dL — ABNORMAL HIGH (ref 0.76–1.27)
GFR calc non Af Amer: 58 mL/min/{1.73_m2} — ABNORMAL LOW (ref >59)
GFR, EST AFRICAN AMERICAN: 67 mL/min/{1.73_m2} (ref >59)
GLUCOSE: 117 mg/dL — AB (ref 65–99)
POTASSIUM: 5.3 mmol/L — AB (ref 3.5–5.2)
Sodium: 140 mmol/L (ref 134–144)

## 2015-08-12 ENCOUNTER — Telehealth: Payer: Self-pay

## 2015-08-12 MED ORDER — PRAVASTATIN SODIUM 40 MG PO TABS: 40.0000 mg | ORAL_TABLET | Freq: Every evening | ORAL | Status: DC

## 2015-08-12 NOTE — Addendum Note (Signed)
Addended by: Dede Query R on: 08/12/2015 04:14 PM   Modules accepted: Orders, Medications

## 2015-08-12 NOTE — Telephone Encounter (Signed)
Significant carotid and coronary blockage Definitely needs to be on something for cholesterol Would for now go back on pravastatin dose he was on in the past, 40 mg

## 2015-08-12 NOTE — Telephone Encounter (Signed)
Spoke w/ pt. He reports that he tried to take Crestor, but developed such bad cramps that he thought he had the flu. He states that he stopped it and now feels better.  He would like to make Dr. Rockey Situ aware in case he would like to start him on something else, though he states he is "fine" not taking a statin.

## 2015-08-12 NOTE — Telephone Encounter (Signed)
Left message on pt's vm w/ Dr. Gollan's recommendation.  Asked him to call back w/ any questions or concerns.  

## 2016-01-28 ENCOUNTER — Encounter: Payer: Self-pay | Admitting: Cardiovascular Disease

## 2016-01-28 ENCOUNTER — Other Ambulatory Visit
Admission: RE | Admit: 2016-01-28 | Discharge: 2016-01-28 | Disposition: A | Discharge status: Home / Self Care | Payer: BLUE CROSS/BLUE SHIELD | Source: Ambulatory Visit | Attending: Cardiovascular Disease | Admitting: Cardiovascular Disease

## 2016-01-28 ENCOUNTER — Ambulatory Visit (INDEPENDENT_AMBULATORY_CARE_PROVIDER_SITE_OTHER): Payer: BLUE CROSS/BLUE SHIELD | Admitting: Cardiovascular Disease

## 2016-01-28 VITALS — BP 120/80 | HR 89 | Wt 245.5 lb

## 2016-01-28 DIAGNOSIS — Z79899 Other long term (current) drug therapy: Secondary | ICD-10-CM | POA: Insufficient documentation

## 2016-01-28 DIAGNOSIS — I4891 Unspecified atrial fibrillation: Secondary | ICD-10-CM

## 2016-01-28 DIAGNOSIS — I2510 Atherosclerotic heart disease of native coronary artery without angina pectoris: Secondary | ICD-10-CM | POA: Insufficient documentation

## 2016-01-28 DIAGNOSIS — E785 Hyperlipidemia, unspecified: Secondary | ICD-10-CM

## 2016-01-28 DIAGNOSIS — Z5181 Encounter for therapeutic drug level monitoring: Secondary | ICD-10-CM | POA: Diagnosis not present

## 2016-01-28 DIAGNOSIS — I5031 Acute diastolic (congestive) heart failure: Secondary | ICD-10-CM

## 2016-01-28 DIAGNOSIS — I6523 Occlusion and stenosis of bilateral carotid arteries: Secondary | ICD-10-CM

## 2016-01-28 LAB — DIGOXIN LEVEL: DIGOXIN LVL: 1 ng/mL (ref 0.8–2.0)

## 2016-01-28 MED ORDER — EZETIMIBE 10 MG PO TABS
10.0000 mg | ORAL_TABLET | Freq: Every day | ORAL | Status: DC
Start: 2016-01-28 — End: 2017-02-03

## 2016-01-28 MED ORDER — CARVEDILOL 6.25 MG PO TABS: 6.2500 mg | ORAL_TABLET | Freq: Two times a day (BID) | ORAL | Status: DC

## 2016-01-28 NOTE — Assessment & Plan Note (Signed)
Carotid occlusion bilaterally, monitored by Dr. Lucky Cowboy. We have stressed importance of aggressive cholesterol control

## 2016-01-28 NOTE — Patient Instructions (Signed)
You are doing well.  Please start zetia one a day for cholesterol when it goes generic  Take lasix as needed for shortness of breath, leg or stomach swelling  We will check a digoxin level today  Please call us if you have new issues that need to be addressed before your next appt.  Your physician wants you to follow-up in: 6 months.  You will receive a reminder letter in the mail two months in advance. If you don't receive a letter, please call our office to schedule the follow-up appointment.

## 2016-01-28 NOTE — Progress Notes (Signed)
Patient ID: Jeremy Reese, male    DOB: [birth year], 65 y.o.   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 Lucky Cowboy, atrial fibrillation dating back to 2011 who presents for follow-up of his atrial fibrillation  severe back pain and is not a surgical candidate per the surgeons given his severe bilateral carotid disease. He has a history of coronary artery disease, cardiac catheterization 01/20/2009 with 95% proximal circumflex, 60% mid LAD. PCI of the left circumflex vessel  In follow-up today, he reports that he is taking carvedilol 6.25 mg twice a day He continues to have mild fatigue No regular exercise program Stopped his Crestor, this was causing muscle ache  no significant CHF symptoms, leg edema has resolved Tolerating digoxin, denies any GI or visual side effects  EKG on today's visit shows atrial fibrillation with ventricular rate 90 bpm  Previous LDL 95, total cholesterol 148.   Other past medical history In the past he has tried higher dose pravastatin 80 mg and had side effects including fatigue, weakness, muscle ache. He is able to tolerate 40 mg daily.  In terms of his other medical issues, he has a "flatback" and per his report has spinal stenosis, scoliosis and chronic pain. He is scheduled to see [facility] back clinic. He reports tramadol helps to some degree but still has significant breakthrough pain. He is very limited in his ability to exert himself or ambulate secondary to his back.  Carotid ultrasound reviewed with him from 01/14/2015 showing bilateral internal carotid occlusion, no significant change from ultrasound dated June 2015  Prior studies show cardioversion 02/25/2015 Transesophageal echo 08/11/2010 with PFO noted, ejection fraction 50%, left atrium moderately dilated, moderate MR, mild to moderate TR   Allergies   Allergen Reactions  . Propafenone Anxiety  . Codeine Nausea Only  . Penicillins Rash     Current outpatient prescriptions:  .  apixaban (ELIQUIS) 5 MG TABS tablet, Take 5 mg by mouth 2 (two) times daily., Disp: , Rfl:  .  calcium gluconate 500 MG tablet, Take 1 tablet by mouth daily., Disp: , Rfl:  .  carvedilol (COREG) 6.25 MG tablet, Take 1 tablet (6.25 mg total) by mouth 2 (two) times daily., Disp: 180 tablet, Rfl: 4 .  colchicine 0.6 MG tablet, Take 0.6 mg by mouth 2 (two) times daily as needed., Disp: , Rfl:  .  digoxin (LANOXIN) 0.25 MG tablet, Take 1 tablet (0.25 mg total) by mouth daily., Disp: 30 tablet, Rfl: 11 .  Flaxseed, Linseed, (FLAXSEED OIL) 1200 MG CAPS, Take 1,200 mg by mouth 3 (three) times daily., Disp: , Rfl:  .  furosemide (LASIX) 20 MG tablet, TAKE TWO TABLET A DAY AS NEEDED, Disp: 180 tablet, Rfl: 3 .  gabapentin (NEURONTIN) 800 MG tablet, Take 800 mg by mouth 3 (three) times daily., Disp: , Rfl:  .  Magnesium Gluconate 250 MG TABS, Take 250 mg by mouth daily., Disp: , Rfl:  .  Melatonin 5 MG TABS, Take 5 mg by mouth daily., Disp: , Rfl:  .  nitroGLYCERIN (NITROSTAT) 0.4 MG SL tablet, Place 1 tablet (0.4 mg total) under the tongue every 5 (five) minutes as needed for chest pain., Disp: 25 tablet, Rfl: 3 .  potassium chloride (K-DUR) 10 MEQ tablet, Take 1 tablet (10 mEq total) by mouth daily as needed., Disp: 90 tablet, Rfl: 6 .  pravastatin (  PRAVACHOL) 40 MG tablet, Take 1 tablet (40 mg total) by mouth every evening., Disp: 90 tablet, Rfl: 3 .  traMADol (ULTRAM) 50 MG tablet, Take 100 mg by mouth every 6 (six) hours as needed., Disp: , Rfl:  .  ezetimibe (ZETIA) 10 MG tablet, Take 1 tablet (10 mg total) by mouth daily., Disp: 30 tablet, Rfl: 11   Past Medical History  Diagnosis Date  . Paroxysmal a-fib ([place])   . Carotid artery disease without cerebral infarction ([place])   . Coronary artery disease 2011  . History of TIA (transient ischemic attack)   . Flatback  syndrome of thoracolumbar region   . H/O calcium pyrophosphate deposition disease (CPPD)   . Spinal stenosis of lumbar region without neurogenic claudication   . Hyperlipidemia   . Atrial flutter, paroxysmal [facility])     Past Surgical History  Procedure Laterality Date  . Cardiac catheterization    . Coronary angioplasty with stent placement  2011  . Bilateral carpal tunnel release    . Circumcision, non-newborn      Social History  reports that he has quit smoking. His smoking use included Cigarettes. He has a 10 pack-year smoking history. He does not have any smokeless tobacco history on file. He reports that he drinks alcohol. He reports that he does not use illicit drugs.  Family History family history includes Heart disease (age of onset: 48) in his mother; Hypertension in his father.  Review of Systems  Constitutional: Negative.   Respiratory: Negative.   Cardiovascular: Negative.   Musculoskeletal: Positive for back pain, arthralgias and gait problem.  Skin: Negative.   Neurological: Negative.   Hematological: Negative.   Psychiatric/Behavioral: Negative.   All other systems reviewed and are negative.   BP 120/80 mmHg  Pulse 89  Wt 245 lb 8 oz (111.358 kg)  Physical Exam  Constitutional: He is oriented to person, place, and time. He appears well-developed and well-nourished.  Difficulty ambulating chair to table secondary to back pain Obese  HENT:  Head: Normocephalic.  Nose: Nose normal.  Mouth/Throat: Oropharynx is clear and moist.  Eyes: Conjunctivae are normal. Pupils are equal, round, and reactive to light.  Neck: Normal range of motion. Neck supple. No JVD present.  Cardiovascular: S1 normal, S2 normal, normal heart sounds and intact distal pulses.  An irregularly irregular rhythm present. Tachycardia present.  Exam reveals no gallop and no friction rub.   No murmur heard. Trace edema to the mid shins, pitting  Pulmonary/Chest: Effort normal and breath  sounds normal. No respiratory distress. He has no wheezes. He has no rales. He exhibits no tenderness.  Abdominal: Soft. Bowel sounds are normal. He exhibits no distension. There is no tenderness.  Musculoskeletal: Normal range of motion. He exhibits no edema or tenderness.  Lymphadenopathy:    He has no cervical adenopathy.  Neurological: He is alert and oriented to person, place, and time. Coordination normal.  Skin: Skin is warm and dry. No rash noted. No erythema.  Psychiatric: He has a normal mood and affect. His behavior is normal. Judgment and thought content normal.      Assessment and Plan   Nursing note and vitals reviewed.

## 2016-01-28 NOTE — Assessment & Plan Note (Signed)
Chronic atrial fibrillation, tolerating eliquis, able to purchase this for $10 Rate well controlled No medication changes made

## 2016-01-28 NOTE — Assessment & Plan Note (Signed)
He is taking Lasix as needed for leg swelling, not on a regular basis

## 2016-01-28 NOTE — Assessment & Plan Note (Signed)
Currently taking pravastatin Recommended that he add zetia 10 mg daily to achieve goal LDL less than 70

## 2016-02-11 ENCOUNTER — Other Ambulatory Visit: Payer: Self-pay | Admitting: *Deleted

## 2016-02-11 MED ORDER — APIXABAN 5 MG PO TABS: 5.0000 mg | ORAL_TABLET | Freq: Two times a day (BID) | ORAL | Status: DC

## 2016-02-11 NOTE — Telephone Encounter (Signed)
Requested Prescriptions   Signed Prescriptions Disp Refills  . apixaban (ELIQUIS) 5 MG TABS tablet 60 tablet 3    Sig: Take 1 tablet (5 mg total) by mouth 2 (two) times daily.    Authorizing Provider: GOLLAN, TIMOTHY J    Ordering User: LOPEZ, MARINA C    

## 2016-05-05 ENCOUNTER — Telehealth: Payer: Self-pay | Admitting: *Deleted

## 2016-05-05 NOTE — Telephone Encounter (Signed)
Asa Lente called requesting that Dena return @ [phone]. thanks

## 2016-05-05 NOTE — Telephone Encounter (Signed)
Spoke with Asa Lente from referring MD office.  States patient was referred in March and wanted to know if he had been here.  Spoke with Angie who states that the patient was left a message and never called to schedule appointment.  Nisha notified.

## 2016-05-18 ENCOUNTER — Other Ambulatory Visit: Payer: Self-pay | Admitting: *Deleted

## 2016-05-18 MED ORDER — APIXABAN 5 MG PO TABS
5.0000 mg | ORAL_TABLET | Freq: Two times a day (BID) | ORAL | Status: DC
Start: 2016-05-18 — End: 2016-11-01

## 2016-07-13 ENCOUNTER — Encounter: Payer: Self-pay | Admitting: *Deleted

## 2016-07-13 ENCOUNTER — Ambulatory Visit: Payer: BLUE CROSS/BLUE SHIELD | Admitting: Cardiovascular Disease

## 2016-07-13 DIAGNOSIS — R0989 Other specified symptoms and signs involving the circulatory and respiratory systems: Secondary | ICD-10-CM

## 2016-07-26 ENCOUNTER — Encounter (INDEPENDENT_AMBULATORY_CARE_PROVIDER_SITE_OTHER): Payer: Self-pay

## 2016-07-26 ENCOUNTER — Encounter: Payer: Self-pay | Admitting: Cardiovascular Disease

## 2016-07-26 ENCOUNTER — Ambulatory Visit (INDEPENDENT_AMBULATORY_CARE_PROVIDER_SITE_OTHER): Payer: BLUE CROSS/BLUE SHIELD | Admitting: Cardiovascular Disease

## 2016-07-26 ENCOUNTER — Ambulatory Visit: Payer: BLUE CROSS/BLUE SHIELD | Admitting: Cardiovascular Disease

## 2016-07-26 VITALS — BP 164/94 | HR 80 | Ht 71.0 in | Wt 239.2 lb

## 2016-07-26 DIAGNOSIS — I4891 Unspecified atrial fibrillation: Secondary | ICD-10-CM

## 2016-07-26 DIAGNOSIS — I5031 Acute diastolic (congestive) heart failure: Secondary | ICD-10-CM

## 2016-07-26 DIAGNOSIS — I6523 Occlusion and stenosis of bilateral carotid arteries: Secondary | ICD-10-CM

## 2016-07-26 DIAGNOSIS — I2510 Atherosclerotic heart disease of native coronary artery without angina pectoris: Secondary | ICD-10-CM

## 2016-07-26 DIAGNOSIS — E785 Hyperlipidemia, unspecified: Secondary | ICD-10-CM

## 2016-07-26 NOTE — Patient Instructions (Signed)
Medication Instructions:   Monitor blood pressure If it runs high, Try some lasix with potassium  If it continues to run high, We could increase the coreg up to 12.5 twice a day  Labwork:  No new labs needed  Testing/Procedures:  No further testing at this time   Follow-Up: It was a pleasure seeing you in the office today. Please call us if you have new issues that need to be addressed before your next appt.  [phone]  Your physician wants you to follow-up in: 6 months.  You will receive a reminder letter in the mail two months in advance. If you don't receive a letter, please call our office to schedule the follow-up appointment.  If you need a refill on your cardiac medications before your next appointment, please call your pharmacy.

## 2016-07-26 NOTE — Progress Notes (Signed)
Cardiology Office Note  Date:  07/26/2016   ID:  Jeremy Reese, DOB December 26, 1950, MRN [id number]  PCP:  Jeremy Ramsay, MD   Chief Complaint  Patient presents with  . Other    6 month f/u. Meds reviewed verbally with pt.    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 He has a history of coronary artery disease, cardiac catheterization 01/20/2009 with 95% proximal circumflex, 60% mid LAD. PCI of the left circumflex vessel Old CVA on CT scan head in 2015  In follow-up today, he reports that he is feeling well Denies any symptoms of chest pain No TIA or stroke symptoms No regular exercise program, weight continues to be an issue Blood pressure elevated today, he is not checking this at home Mild improvement on recheck down to [id number] systolic Tolerating pravastatin 40 mg daily with Zetia No recent lab work available, they have been requested from primary care  Does not take Lasix daily, only lasix PRN Denies having significant leg edema  EKG on today's visit shows atrial fibrillation with ventricular rate 80 bpm  Previous LDL 95, total cholesterol 148.   Other past medical history In the past he has tried higher dose pravastatin 80 mg and had side effects including fatigue, weakness, muscle ache. He is able to tolerate 40 mg daily.  In terms of his other medical issues, he has a "flatback" and per his report has spinal stenosis, scoliosis and chronic pain. He is scheduled to see [facility] back clinic. He reports tramadol helps to some degree but still has significant breakthrough pain. He is very limited in his ability to exert himself or ambulate  secondary to his back.  Carotid ultrasound reviewed with him from 01/14/2015 showing bilateral internal carotid occlusion, no significant change from ultrasound dated June 2015  Prior studies show cardioversion 02/25/2015 Transesophageal echo 08/11/2010 with PFO noted, ejection fraction 50%, left atrium moderately dilated, moderate MR, mild to moderate TR   PMH:   has a past medical history of Atrial flutter, paroxysmal ([place]); Carotid artery disease without cerebral infarction ([place]); Coronary artery disease (2011); Flatback syndrome of thoracolumbar region; H/O calcium pyrophosphate deposition disease (CPPD); History of TIA (transient ischemic attack); Hyperlipidemia; Paroxysmal a-fib ([place]); and Spinal stenosis of lumbar region without neurogenic claudication.  PSH:    Past Surgical History:  Procedure Laterality Date  . BILATERAL CARPAL TUNNEL RELEASE    . CARDIAC CATHETERIZATION    . CIRCUMCISION, NON-NEWBORN    . CORONARY ANGIOPLASTY WITH STENT PLACEMENT  2011    Current Outpatient Prescriptions  Medication Sig Dispense Refill  . apixaban (ELIQUIS) 5 MG TABS tablet Take 1 tablet (5 mg total) by mouth 2 (two) times daily. 60 tablet 3  . calcium gluconate 500 MG tablet Take 1 tablet by mouth daily.    . carvedilol (COREG) 6.25 MG tablet Take 1 tablet (6.25 mg total) by mouth 2 (two) times daily. 180 tablet 4  . colchicine 0.6 MG tablet Take 0.6 mg by mouth 2 (two) times daily as needed.    . digoxin (LANOXIN) 0.25 MG tablet Take 1 tablet (0.25 mg total) by mouth daily. 30 tablet 11  . ezetimibe (ZETIA) 10  MG tablet Take 1 tablet (10 mg total) by mouth daily. 30 tablet 11  . Flaxseed, Linseed, (FLAXSEED OIL) 1200 MG CAPS Take 1,200 mg by mouth 3 (three) times daily.    . furosemide (LASIX) 20 MG tablet TAKE TWO TABLET A DAY AS NEEDED 180 tablet 3  . gabapentin (NEURONTIN) 800 MG tablet Take 800 mg by mouth 3 (three) times daily.    . Magnesium Gluconate 250 MG TABS Take 250 mg by  mouth daily.    . Melatonin 5 MG TABS Take 5 mg by mouth daily.    . nitroGLYCERIN (NITROSTAT) 0.4 MG SL tablet Place 1 tablet (0.4 mg total) under the tongue every 5 (five) minutes as needed for chest pain. 25 tablet 3  . potassium chloride (K-DUR) 10 MEQ tablet Take 1 tablet (10 mEq total) by mouth daily as needed. 90 tablet 6  . pravastatin (PRAVACHOL) 40 MG tablet Take 1 tablet (40 mg total) by mouth every evening. 90 tablet 3  . traMADol (ULTRAM) 50 MG tablet Take 100 mg by mouth every 6 (six) hours as needed.     No current facility-administered medications for this visit.      Allergies:   Propafenone; Codeine; and Penicillins   Social History:  The patient  reports that he has quit smoking. His smoking use included Cigarettes. He has a 10.00 pack-year smoking history. He has never used smokeless tobacco. He reports that he drinks alcohol. He reports that he does not use drugs.   Family History:   family history includes Heart disease (age of onset: 85) in his mother; Hypertension in his father.    Review of Systems: Review of Systems  Constitutional: Negative.   Respiratory: Negative.   Cardiovascular: Negative.   Gastrointestinal: Negative.   Musculoskeletal: Negative.   Neurological: Negative.   Psychiatric/Behavioral: Negative.   All other systems reviewed and are negative.    PHYSICAL EXAM: VS:  BP (!) 164/94 (BP Location: Left Arm, Patient Position: Sitting, Cuff Size: Normal)   Pulse 80   Ht 5\' 11"  (1.803 m)   Wt 239 lb 4 oz (108.5 kg)   BMI 33.37 kg/m  , BMI Body mass index is 33.37 kg/m. GEN: Well nourished, well developed, in no acute distress, obese  HEENT: normal  Neck: no JVD, carotid bruits, or masses Cardiac: RRR; no murmurs, rubs, or gallops,no edema  Respiratory:  clear to auscultation bilaterally, normal work of breathing GI: soft, nontender, nondistended, + BS MS: no deformity or atrophy  Skin: warm and dry, no rash Neuro:  Strength and  sensation are intact Psych: euthymic mood, full affect    Recent Labs: 07/30/2015: BUN 22; Creatinine, Ser 1.30; Potassium 5.3; Sodium 140    Lipid Panel Lab Results  Component Value Date   CHOL 147 04/02/2015   HDL 38 (L) 04/02/2015   LDLCALC 95 04/02/2015   TRIG 70 04/02/2015      Wt Readings from Last 3 Encounters:  07/26/16 239 lb 4 oz (108.5 kg)  01/28/16 245 lb 8 oz (111.4 kg)  07/30/15 223 lb 8 oz (101.4 kg)       ASSESSMENT AND PLAN:  Atrial fibrillation, unspecified - Plan: EKG 12-Lead Rate controlled atrial fibrillation, tolerating anticoagulation No medication changes made  Hyperlipidemia Cholesterol is at goal on the current lipid regimen. No changes to the medications were made.  Carotid stenosis, bilateral Bilateral occluded carotid arteries Followed by Jeremy Reese Stressed importance of aggressive lipid control  Coronary artery disease involving native  coronary artery of native heart without angina pectoris Currently with no symptoms of angina. No further workup at this time. Continue current medication regimen. Long discussion concerning anginal symptoms  Acute diastolic CHF (congestive heart failure) ([place]) Appears relatively euvolemic on today's visit, Recommended Lasix for any ankle swelling or shortness of breath, weight gain   Total encounter time more than 25 minutes  Greater than 50% was spent in counseling and coordination of care with the patient   Disposition:   F/U  6 months   Orders Placed This Encounter  Procedures  . EKG 12-Lead     Signed, Esmond Plants, M.D., Ph.D. 07/26/2016  [place], [place]

## 2016-08-09 ENCOUNTER — Other Ambulatory Visit: Payer: Self-pay | Admitting: *Deleted

## 2016-08-09 MED ORDER — DIGOXIN 250 MCG PO TABS: 0.2500 mg | ORAL_TABLET | Freq: Every day | ORAL | 3 refills | Status: DC

## 2016-10-27 ENCOUNTER — Other Ambulatory Visit: Payer: Self-pay

## 2016-10-27 MED ORDER — PRAVASTATIN SODIUM 40 MG PO TABS: 40.0000 mg | ORAL_TABLET | Freq: Every evening | ORAL | 3 refills | Status: DC

## 2016-11-01 ENCOUNTER — Other Ambulatory Visit: Payer: Self-pay | Admitting: Cardiovascular Disease

## 2016-11-01 MED ORDER — APIXABAN 5 MG PO TABS: 5.0000 mg | ORAL_TABLET | Freq: Two times a day (BID) | ORAL | 3 refills | Status: DC

## 2016-12-08 ENCOUNTER — Ambulatory Visit: Payer: Self-pay | Admitting: Urology

## 2016-12-13 ENCOUNTER — Other Ambulatory Visit: Payer: Self-pay | Admitting: *Deleted

## 2016-12-13 MED ORDER — DIGOXIN 250 MCG PO TABS: 0.2500 mg | ORAL_TABLET | Freq: Every day | ORAL | 3 refills | Status: DC

## 2016-12-20 ENCOUNTER — Encounter: Payer: Self-pay | Admitting: Urology

## 2016-12-20 ENCOUNTER — Ambulatory Visit (INDEPENDENT_AMBULATORY_CARE_PROVIDER_SITE_OTHER): Payer: BLUE CROSS/BLUE SHIELD | Admitting: Urology

## 2016-12-20 VITALS — BP 159/94 | HR 64 | Ht 71.0 in | Wt 250.2 lb

## 2016-12-20 DIAGNOSIS — E291 Testicular hypofunction: Secondary | ICD-10-CM | POA: Diagnosis not present

## 2016-12-20 NOTE — Progress Notes (Signed)
12/20/2016 10:50 AM   [name] 1951-04-30 [id number]  Referring provider: Lorelee Market, MD [place], [place] [address]  Chief Complaint  Patient presents with  . Hypogonadism    HPI: 66 year old male who is seen today for further management and evaluation of low testosterone. The patient's testosterone drawn on 08/26/16 was 236. The patient states that his symptoms include decreased energy level and a decrease in libido. Surprisingly, the patient does obtain erections without the use of any medications. The patient's symptoms of present for some time. The patient states that he gets up between 1 and 6 times at night. He has relatively few voiding complaints otherwise. He snores, but does not have sleep apnea according to his wife.  The patient has a history significant for severe carotid artery stenosis bilaterally. He also has a history of coronary artery disease and atrial fibrillation. He also has a history of debilitating back pain.  The patient is married, retired, and also carries a diagnosis of seasonal affective disorder.     PMH: Past Medical History:  Diagnosis Date  . Atrial flutter, paroxysmal ([place])   . Carotid artery disease without cerebral infarction ([place])   . Coronary artery disease 2011  . Flatback syndrome of thoracolumbar region   . H/O calcium pyrophosphate deposition disease (CPPD)   . History of TIA (transient ischemic attack)   . Hyperlipidemia   . Paroxysmal a-fib ([place])   . Spinal stenosis of lumbar region without neurogenic claudication     Surgical History: Past Surgical History:  Procedure Laterality Date  . BILATERAL CARPAL TUNNEL RELEASE    . CARDIAC CATHETERIZATION    . CIRCUMCISION, NON-NEWBORN    . CORONARY ANGIOPLASTY WITH STENT PLACEMENT  2011    Home Medications:  Allergies as of 12/20/2016      Reactions   Propafenone Anxiety   Codeine Nausea Only   Penicillins Rash      Medication List       Accurate  as of 12/20/16 10:50 AM. Always use your most recent med list.          apixaban 5 MG Tabs tablet Commonly known as:  ELIQUIS Take 1 tablet (5 mg total) by mouth 2 (two) times daily.   calcium gluconate 500 MG tablet Take 1 tablet by mouth daily.   carvedilol 6.25 MG tablet Commonly known as:  COREG Take 1 tablet (6.25 mg total) by mouth 2 (two) times daily.   colchicine 0.6 MG tablet Take 0.6 mg by mouth 2 (two) times daily as needed.   digoxin 0.25 MG tablet Commonly known as:  LANOXIN Take 1 tablet (0.25 mg total) by mouth daily.   DOCOSAHEXAENOIC ACID PO Take by mouth.   ezetimibe 10 MG tablet Commonly known as:  ZETIA Take 1 tablet (10 mg total) by mouth daily.   Flaxseed Oil 1200 MG Caps Take 1,200 mg by mouth 3 (three) times daily.   furosemide 20 MG tablet Commonly known as:  LASIX TAKE TWO TABLET A DAY AS NEEDED   gabapentin 800 MG tablet Commonly known as:  NEURONTIN Take 800 mg by mouth 3 (three) times daily.   Magnesium Gluconate 250 MG Tabs Take 250 mg by mouth daily.   Melatonin 5 MG Tabs Take 5 mg by mouth daily.   nitroGLYCERIN 0.4 MG SL tablet Commonly known as:  NITROSTAT Place 1 tablet (0.4 mg total) under the tongue every 5 (five) minutes as needed for chest pain.   potassium chloride 10  MEQ tablet Commonly known as:  K-DUR Take 1 tablet (10 mEq total) by mouth daily as needed.   pravastatin 40 MG tablet Commonly known as:  PRAVACHOL Take 1 tablet (40 mg total) by mouth every evening.   traMADol 50 MG tablet Commonly known as:  ULTRAM Take 100 mg by mouth every 6 (six) hours as needed.       Allergies:  Allergies  Allergen Reactions  . Propafenone Anxiety  . Codeine Nausea Only  . Penicillins Rash    Family History: Family History  Problem Relation Age of Onset  . Heart disease Mother 34    CABG x 3   . Hypertension Father     Social History:  reports that he has quit smoking. His smoking use included Cigarettes. He  has a 10.00 pack-year smoking history. He has never used smokeless tobacco. He reports that he drinks alcohol. He reports that he does not use drugs.  ROS: UROLOGY Frequent Urination?: No Hard to postpone urination?: No Burning/pain with urination?: No Get up at night to urinate?: Yes Leakage of urine?: Yes Urine stream starts and stops?: No Trouble starting stream?: No Do you have to strain to urinate?: No Blood in urine?: No Urinary tract infection?: No Sexually transmitted disease?: No Injury to kidneys or bladder?: No Painful intercourse?: No Weak stream?: No Erection problems?: No Penile pain?: No  Gastrointestinal Nausea?: No Vomiting?: No Indigestion/heartburn?: No Diarrhea?: No Constipation?: No  Constitutional Fever: No Night sweats?: No Weight loss?: No Fatigue?: Yes  Skin Skin rash/lesions?: No Itching?: No  Eyes Blurred vision?: No Double vision?: No  Ears/Nose/Throat Sore throat?: No Sinus problems?: Yes  Hematologic/Lymphatic Swollen glands?: No Easy bruising?: No  Cardiovascular Leg swelling?: Yes Chest pain?: Yes  Respiratory Cough?: No Shortness of breath?: No  Endocrine Excessive thirst?: No  Musculoskeletal Back pain?: Yes Joint pain?: Yes  Neurological Headaches?: No Dizziness?: No  Psychologic Depression?: No Anxiety?: No  Physical Exam: BP (!) 159/94 (BP Location: Left Arm, Patient Position: Sitting, Cuff Size: Normal)   Pulse 64   Ht 5\' 11"  (1.803 m)   Wt 113.5 kg (250 lb 3.2 oz)   BMI 34.90 kg/m   Constitutional:  Alert and oriented, No acute distress. HEENT: [place] AT, moist mucus membranes.  Trachea midline, no masses. Cardiovascular: No clubbing, cyanosis, or edema. Respiratory: Normal respiratory effort, no increased work of breathing. GI: Abdomen is soft, nontender, nondistended, no abdominal masses GU: No CVA tenderness.  Skin: No rashes, bruises or suspicious lesions. Lymph: No cervical or inguinal  adenopathy. Neurologic: Grossly intact, no focal deficits, moving all 4 extremities. Psychiatric: Normal mood and affect.  Laboratory Data: No results found for: WBC, HGB, HCT, MCV, PLT  Lab Results  Component Value Date   CREATININE 1.30 (H) 07/30/2015    No results found for: PSA  No results found for: TESTOSTERONE  No results found for: HGBA1C  Urinalysis No results found for: COLORURINE, APPEARANCEUR, LABSPEC, PHURINE, GLUCOSEU, HGBUR, BILIRUBINUR, KETONESUR, PROTEINUR, UROBILINOGEN, NITRITE, LEUKOCYTESUR  Pertinent Imaging: None  Assessment & Plan:  The patient has low testosterone with symptoms including decreased libido and energy level. We discussed the potential etiology of his symptoms which may be from his low testosterone but could also be from his depression as well as his poor sleep hygiene and a natural function of aging. We talked about testosterone supplementation in significant detail and I explained to him the risks of it including increased risk of heart attack and stroke. We also discussed  the expense associated with testosterone replacement therapy as well as the intense monitoring and associated blood work required. Given the risks of increased stroke and heart attack along with the patient's associated comorbidities including severe carotids stenosis and coronary artery disease I have recommended that we avoid increasing the risks of stroke and heart attack by placing him on testosterone replacement therapy. We discussed alternative measures which would include weight loss, improved sleep hygiene, and refocusing his priorities and interests. We had a lengthy discussion and the patient was appreciative of the information that he received. Ultimately, the patient declined any additional therapy and will follow up with [place] on an as-needed basis.   CC: Lorriane Shire, NP, [facility]  There are no diagnoses linked to this encounter.  No Follow-up on  file.  Ardis Hughs, [facility] [address], [place], [place] [address] [phone]

## 2017-01-25 ENCOUNTER — Encounter: Payer: Self-pay | Admitting: Cardiovascular Disease

## 2017-01-25 ENCOUNTER — Ambulatory Visit (INDEPENDENT_AMBULATORY_CARE_PROVIDER_SITE_OTHER): Payer: BLUE CROSS/BLUE SHIELD | Admitting: Cardiovascular Disease

## 2017-01-25 VITALS — BP 140/82 | HR 75 | Ht 71.0 in | Wt 249.0 lb

## 2017-01-25 DIAGNOSIS — I2510 Atherosclerotic heart disease of native coronary artery without angina pectoris: Secondary | ICD-10-CM

## 2017-01-25 DIAGNOSIS — I6523 Occlusion and stenosis of bilateral carotid arteries: Secondary | ICD-10-CM

## 2017-01-25 DIAGNOSIS — I5031 Acute diastolic (congestive) heart failure: Secondary | ICD-10-CM | POA: Diagnosis not present

## 2017-01-25 DIAGNOSIS — I482 Chronic atrial fibrillation, unspecified: Secondary | ICD-10-CM

## 2017-01-25 DIAGNOSIS — E782 Mixed hyperlipidemia: Secondary | ICD-10-CM

## 2017-01-25 NOTE — Progress Notes (Signed)
Cardiology Office Note  Date:  01/25/2017   ID:  Jeremy Reese, DOB 1951-05-28, MRN [id number]  PCP:  Vista Mink, FNP   Chief Complaint  Patient presents with  . other    6 month follow up. "doing well." Meds reviewed by the pt. verbally.     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 Lucky Cowboy, atrial fibrillation dating back to 2011 who presents for follow-up of his atrial fibrillation History of severe back pain and is not a surgical candidate per the surgeons given his severe bilateral carotid disease.  history of coronary artery disease, cardiac catheterization 01/20/2009 with 95% proximal circumflex, 60% mid LAD. PCI of the left circumflex vessel Old CVA on CT scan head in 2015  In follow-up today, he reports that he is feeling well Denies any symptoms of chest pain, no TIA or stroke type symptoms  Weight is up 10 pounds over the past several months   No regular exercise program,  No recent lab work available, we have requested lipid panel from primary care (Labs arrived after he left the office, total cholesterol 195, LDL 121, hemoglobin A1c 5.8)  Tolerating pravastatin 40 mg daily with Zetia Had difficulty tolerating high-dose pravastatin, unable to tolerate other statins such as Lipitor secondary to myalgias  Does not take Lasix daily, only lasix PRN Denies having significant leg edema  EKG on today's visit shows atrial fibrillation with ventricular rate 80 bpm  Previous LDL 95, total cholesterol 148.   Other past medical history In the past he has tried higher dose pravastatin 80 mg and had side effects including fatigue, weakness, muscle ache. He is able to tolerate 40 mg daily.  In terms of his other medical issues, he has a "flatback" and per his report has spinal stenosis, scoliosis and chronic pain. He is scheduled to see  [facility] back clinic. He reports tramadol helps to some degree but still has significant breakthrough pain. He is very limited in his ability to exert himself or ambulate secondary to his back.  Carotid ultrasound reviewed with him from 01/14/2015 showing bilateral internal carotid occlusion, no significant change from ultrasound dated June 2015  Prior studies show cardioversion 02/25/2015 Transesophageal echo 08/11/2010 with PFO noted, ejection fraction 50%, left atrium moderately dilated, moderate MR, mild to moderate TR   PMH:   has a past medical history of Atrial flutter, paroxysmal ([place]); Carotid artery disease without cerebral infarction ([place]); Coronary artery disease (2011); Flatback syndrome of thoracolumbar region; H/O calcium pyrophosphate deposition disease (CPPD); History of TIA (transient ischemic attack); Hyperlipidemia; Paroxysmal a-fib ([place]); and Spinal stenosis of lumbar region without neurogenic claudication.  PSH:    Past Surgical History:  Procedure Laterality Date  . BILATERAL CARPAL TUNNEL RELEASE    . CARDIAC CATHETERIZATION    . CIRCUMCISION, NON-NEWBORN    . CORONARY ANGIOPLASTY WITH STENT PLACEMENT  2011    Current Outpatient Prescriptions  Medication Sig Dispense Refill  . apixaban (ELIQUIS) 5 MG TABS tablet Take 1 tablet (5 mg total) by mouth 2 (two) times daily. 60 tablet 3  . calcium gluconate 500 MG tablet Take 1 tablet by mouth daily.    . carvedilol (COREG) 6.25 MG tablet Take 1 tablet (6.25 mg total) by mouth 2 (two) times daily. 180 tablet 4  . colchicine 0.6 MG tablet Take 0.6 mg by mouth 2 (two) times daily as needed.    Marland Kitchen  digoxin (LANOXIN) 0.25 MG tablet Take 1 tablet (0.25 mg total) by mouth daily. 30 tablet 3  . DOCOSAHEXAENOIC ACID PO Take by mouth.    . ezetimibe (ZETIA) 10 MG tablet Take 1 tablet (10 mg total) by mouth daily. 30 tablet 11  . Flaxseed, Linseed, (FLAXSEED OIL) 1200 MG CAPS Take 1,200 mg by mouth 3 (three) times daily.    .  furosemide (LASIX) 20 MG tablet TAKE TWO TABLET A DAY AS NEEDED 180 tablet 3  . gabapentin (NEURONTIN) 800 MG tablet Take 800 mg by mouth 3 (three) times daily.    . Magnesium Gluconate 250 MG TABS Take 250 mg by mouth daily.    . Melatonin 5 MG TABS Take 5 mg by mouth daily.    . nitroGLYCERIN (NITROSTAT) 0.4 MG SL tablet Place 1 tablet (0.4 mg total) under the tongue every 5 (five) minutes as needed for chest pain. 25 tablet 3  . potassium chloride (K-DUR) 10 MEQ tablet Take 1 tablet (10 mEq total) by mouth daily as needed. 90 tablet 6  . pravastatin (PRAVACHOL) 40 MG tablet Take 1 tablet (40 mg total) by mouth every evening. 90 tablet 3  . traMADol (ULTRAM) 50 MG tablet Take 100 mg by mouth every 6 (six) hours as needed.     No current facility-administered medications for this visit.      Allergies:   Propafenone; Codeine; and Penicillins   Social History:  The patient  reports that he has quit smoking. His smoking use included Cigarettes. He has a 10.00 pack-year smoking history. He has never used smokeless tobacco. He reports that he drinks alcohol. He reports that he does not use drugs.   Family History:   family history includes Heart disease (age of onset: 85) in his mother; Hypertension in his father.    Review of Systems: Review of Systems  Constitutional: Negative.   Respiratory: Negative.   Cardiovascular: Negative.   Gastrointestinal: Negative.   Musculoskeletal: Positive for back pain.  Neurological: Negative.   Psychiatric/Behavioral: Negative.   All other systems reviewed and are negative.    PHYSICAL EXAM: VS:  BP 140/82 (BP Location: Left Arm, Patient Position: Sitting, Cuff Size: Normal)   Pulse 75   Ht 5\' 11"  (1.803 m)   Wt 249 lb (112.9 kg)   BMI 34.73 kg/m  , BMI Body mass index is 34.73 kg/m. GEN: Well nourished, well developed, in no acute distress, Walks in a kyphotic position secondary to back pain  HEENT: normal  Neck: no JVD, carotid bruits, or  masses Cardiac: RRR; no murmurs, rubs, or gallops,no edema  Respiratory:  clear to auscultation bilaterally, normal work of breathing GI: soft, nontender, nondistended, + BS MS:   Atrophy, kyphosis noted of his back  Skin: warm and dry, no rash Neuro:  Strength and sensation are intact Psych: euthymic mood, full affect    Recent Labs: No results found for requested labs within last 8760 hours.    Lipid Panel Lab Results  Component Value Date   CHOL 147 04/02/2015   HDL 38 (L) 04/02/2015   LDLCALC 95 04/02/2015   TRIG 70 04/02/2015      Wt Readings from Last 3 Encounters:  01/25/17 249 lb (112.9 kg)  12/20/16 250 lb 3.2 oz (113.5 kg)  07/26/16 239 lb 4 oz (108.5 kg)       ASSESSMENT AND PLAN:  Acute diastolic CHF (congestive heart failure) ([place]) - Plan: EKG 12-Lead Appears relatively euvolemic on today's visit, takes  Lasix as needed for leg swelling, weight gain, shortness of breath. Recent climb in weight suspect secondary to poor diet  Bilateral carotid artery stenosis - Plan: EKG 12-Lead Occluded carotids bilaterally Followed by Dr. Lucky Cowboy. Asymptomatic  Coronary artery disease involving native coronary artery of native heart without angina pectoris - Plan: EKG 12-Lead Currently with no symptoms of angina. No further workup at this time.  We will work on more aggressively managing his cholesterol  Chronic atrial fibrillation ([place]) - Plan: EKG 12-Lead Tolerating anticoagulation,  Rate well controlled With carvedilol, digoxin  Mixed hyperlipidemia - Plan: EKG 12-Lead Lab work reviewed after he has left the clinic LDL 121, goal LDL less than 70 preferably 60 or 50 We'll recommend he change to Crestor 40 mg daily with Zetia If unable to tolerate this, would recommend praluent or repatha  Morbid obesity We have encouraged continued exercise, careful diet management in an effort to lose weight.   Total encounter time more than 25 minutes  Greater than 50% was  spent in counseling and coordination of care with the patient   Disposition:   F/U  6 months   Orders Placed This Encounter  Procedures  . EKG 12-Lead     Signed, Esmond Plants, M.D., Ph.D. 01/25/2017  [place], [place]

## 2017-01-25 NOTE — Patient Instructions (Signed)
We will obtain your labs from primary care   Medication Instructions:   No medication changes made  Labwork:  No new labs needed  Testing/Procedures:  No further testing at this time   I recommend watching educational videos on topics of interest to you at:       [email or web address]  Enter code: [email or web address]    Follow-Up: It was a pleasure seeing you in the office today. Please call us if you have new issues that need to be addressed before your next appt.  [phone]  Your physician wants you to follow-up in: 6 months.  You will receive a reminder letter in the mail two months in advance. If you don't receive a letter, please call our office to schedule the follow-up appointment.  If you need a refill on your cardiac medications before your next appointment, please call your pharmacy.

## 2017-01-26 ENCOUNTER — Ambulatory Visit: Payer: BLUE CROSS/BLUE SHIELD | Admitting: Cardiovascular Disease

## 2017-01-27 ENCOUNTER — Telehealth: Payer: Self-pay

## 2017-01-27 NOTE — Telephone Encounter (Signed)
Left message for pt to call back  °

## 2017-01-27 NOTE — Telephone Encounter (Signed)
-----   Message from Minna Merritts, MD sent at 01/25/2017  7:12 PM EST ----- Cholesterol is well but goal, LDL 121 We need LDL less than 70 preferably 60 or 50 Would recommend he change pravastatin to Crestor 40 mg daily with Zetia Recheck lipids in 3 months thx TG

## 2017-01-31 ENCOUNTER — Other Ambulatory Visit: Payer: Self-pay | Admitting: Cardiovascular Disease

## 2017-02-03 ENCOUNTER — Other Ambulatory Visit: Payer: Self-pay | Admitting: *Deleted

## 2017-02-03 ENCOUNTER — Other Ambulatory Visit: Payer: Self-pay | Admitting: Cardiovascular Disease

## 2017-02-03 MED ORDER — EZETIMIBE 10 MG PO TABS: ORAL_TABLET | ORAL | 3 refills | Status: DC

## 2017-02-17 NOTE — Telephone Encounter (Signed)
Left message for pt to call back  °

## 2017-03-16 ENCOUNTER — Other Ambulatory Visit: Payer: Self-pay | Admitting: Cardiovascular Disease

## 2017-04-12 ENCOUNTER — Telehealth: Payer: Self-pay | Admitting: Cardiovascular Disease

## 2017-04-12 NOTE — Telephone Encounter (Signed)
Pt states he read in Consumer Reports ,June 2018 issue, that Digoxin increases the rate of fatality to 66% in 3 years. Pt states he is taking this medication and asks if he should be switched to something different. Please call and advise.

## 2017-04-13 NOTE — Telephone Encounter (Signed)
Spoke with patient and let him know that we have sent this to Dr. Rockey Situ for his review and will be in touch with his recommendations. Made him aware that it may be tomorrow since he is not in the office today. He verbalized understanding and had no further questions at this time.

## 2017-04-13 NOTE — Telephone Encounter (Signed)
Pt called back to see the status of his question yesterday. I did inform him that we have sent the question to Dr. Rockey Situ and are awaiting a response.

## 2017-04-14 NOTE — Telephone Encounter (Signed)
Reviewed Dr. Donivan Scull recommendations and he verbalized understanding. He is agreeable to stay on the digoxin and continue monitoring symptoms. Instructed him to call back if he should change his mind and he verbalized understanding with no further questions.

## 2017-04-14 NOTE — Telephone Encounter (Signed)
Digoxin for him used to slow his heart rate He is welcome to stop the medication if he would like We have many people on digoxin typically without complications Without digoxin he should probably increase his carvedilol up to 12.5 mg twice a day for heart rate control

## 2017-05-11 ENCOUNTER — Other Ambulatory Visit: Payer: Self-pay | Admitting: Cardiovascular Disease

## 2017-05-11 ENCOUNTER — Other Ambulatory Visit: Payer: Self-pay | Admitting: *Deleted

## 2017-05-11 ENCOUNTER — Other Ambulatory Visit: Payer: Self-pay

## 2017-05-11 MED ORDER — FUROSEMIDE 20 MG PO TABS: ORAL_TABLET | ORAL | 3 refills | Status: DC

## 2017-05-11 MED ORDER — CARVEDILOL 6.25 MG PO TABS: 6.2500 mg | ORAL_TABLET | Freq: Two times a day (BID) | ORAL | 3 refills | Status: DC

## 2017-06-07 ENCOUNTER — Ambulatory Visit: Payer: BLUE CROSS/BLUE SHIELD | Admitting: Family Medicine

## 2017-07-21 ENCOUNTER — Other Ambulatory Visit: Payer: Self-pay | Admitting: Cardiovascular Disease

## 2017-07-24 NOTE — Progress Notes (Signed)
Cardiology Office Note  Date:  07/25/2017   ID:  Jeremy Reese, Jeremy Reese 04/14/1951, MRN [id number]  PCP:  Remi Haggard, FNP   Chief Complaint  Patient presents with  . other    6 month f/u no complaints today. Meds reviewed verbally with pt.    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 Lucky Cowboy,  atrial fibrillation dating back to 2011  severe back pain not a surgical candidate per the surgeons given his severe bilateral carotid disease who presents for follow-up of his atrial fibrillation  history of coronary artery disease, cardiac catheterization 01/20/2009 with 95% proximal circumflex, 60% mid LAD. PCI of the left circumflex vessel Old CVA on CT scan head in 2015  In follow-up he reports doing okay, chronic pain Takes tramadol twice a day, would like to take this 3 times a day for breakthrough pain Weight continues to trend upwards Denies any symptoms of chest pain, no TIA or stroke type symptoms  Review of lab work from end of 2017 shows cholesterol continues to run high 195 LDL 120s  Reports having poor diet No regular exercise program,  Tolerating pravastatin 40 mg daily with Zetia Had difficulty tolerating high-dose pravastatin, unable to tolerate other statins such as Lipitor secondary to myalgias Uncertain if he has taken Crestor in the past  Does not take Lasix daily, only lasix PRN when he gets leg swelling currentlyDenies having significant leg edema  EKG on today's visit personally reviewed by myself shows atrial fibrillation with ventricular rate 77 bpm, PVC, nonspecific ST abnormality anterolateral leads V4 through V6, 1 and aVL  Other past medical history In the past he has tried higher dose pravastatin 80 mg and had side effects including fatigue, weakness, muscle ache. He is able to tolerate 40 mg  daily.  In terms of his other medical issues, he has a "flatback" and per his report has spinal stenosis, scoliosis and chronic pain. He is scheduled to see [facility] back clinic. He reports tramadol helps to some degree but still has significant breakthrough pain. He is very limited in his ability to exert himself or ambulate secondary to his back.  Carotid ultrasound reviewed with him from 01/14/2015 showing bilateral internal carotid occlusion, no significant change from ultrasound dated June 2015  Prior studies show cardioversion 02/25/2015 Transesophageal echo 08/11/2010 with PFO noted, ejection fraction 50%, left atrium moderately dilated, moderate MR, mild to moderate TR   PMH:   has a past medical history of Atrial flutter, paroxysmal ([place]); Carotid artery disease without cerebral infarction ([place]); Coronary artery disease (2011); Flatback syndrome of thoracolumbar region; H/O calcium pyrophosphate deposition disease (CPPD); History of TIA (transient ischemic attack); Hyperlipidemia; Paroxysmal A-fib ([place]); and Spinal stenosis of lumbar region without neurogenic claudication.  PSH:    Past Surgical History:  Procedure Laterality Date  . BILATERAL CARPAL TUNNEL RELEASE    . CARDIAC CATHETERIZATION    . CIRCUMCISION, NON-NEWBORN    . CORONARY ANGIOPLASTY WITH STENT PLACEMENT  2011    Current Outpatient Prescriptions  Medication Sig Dispense Refill  . calcium gluconate 500 MG tablet Take 1 tablet by mouth daily.    . carvedilol (COREG) 6.25 MG tablet Take 1 tablet (6.25 mg total) by mouth 2 (two) times daily. 180 tablet 3  . Coenzyme Q10 (CO Q 10) 100 MG CAPS Take by mouth daily.    Marland Kitchen  colchicine 0.6 MG tablet Take 0.6 mg by mouth 2 (two) times daily as needed.    . digoxin (LANOXIN) 0.25 MG tablet TAKE ONE (1) TABLET BY MOUTH EVERY DAY 30 tablet 3  . DOCOSAHEXAENOIC ACID PO Take by mouth.    [name] 5 MG TABS tablet TAKE ONE TABLET TWICE DAILY 60 tablet 0  . ezetimibe (ZETIA) 10  MG tablet TAKE ONE (1) TABLET EACH DAY 30 tablet 3  . furosemide (LASIX) 20 MG tablet TAKE TWO TABLET A DAY AS NEEDED (Patient taking differently: 20 mg. TAKE TWO TABLET A DAY AS NEEDED) 180 tablet 3  . gabapentin (NEURONTIN) 800 MG tablet Take 800 mg by mouth 3 (three) times daily.    . Magnesium Gluconate 250 MG TABS Take 250 mg by mouth daily.    . Melatonin 5 MG TABS Take 5 mg by mouth daily.    . nitroGLYCERIN (NITROSTAT) 0.4 MG SL tablet Place 1 tablet (0.4 mg total) under the tongue every 5 (five) minutes as needed for chest pain. 25 tablet 3  . potassium chloride (K-DUR) 10 MEQ tablet Take 1 tablet (10 mEq total) by mouth daily as needed. 90 tablet 6  . traMADol (ULTRAM) 50 MG tablet Take 100 mg by mouth every 6 (six) hours as needed.    . rosuvastatin (CRESTOR) 40 MG tablet Take 1 tablet (40 mg total) by mouth daily. 30 tablet 11   No current facility-administered medications for this visit.      Allergies:   Propafenone; Codeine; and Penicillins   Social History:  The patient  reports that he has quit smoking. His smoking use included Cigarettes. He has a 10.00 pack-year smoking history. He has never used smokeless tobacco. He reports that he drinks alcohol. He reports that he does not use drugs.   Family History:   family history includes Heart disease (age of onset: 48) in his mother; Hypertension in his father.    Review of Systems: Review of Systems  Constitutional: Negative.   Respiratory: Negative.   Cardiovascular: Negative.   Gastrointestinal: Negative.   Musculoskeletal: Positive for back pain.  Neurological: Negative.   Psychiatric/Behavioral: Negative.   All other systems reviewed and are negative.    PHYSICAL EXAM: VS:  BP 136/80 (BP Location: Left Arm, Patient Position: Sitting, Cuff Size: Normal)   Pulse 77   Ht 5\' 11"  (1.803 m)   Wt 249 lb 12 oz (113.3 kg)   BMI 34.83 kg/m  , BMI Body mass index is 34.83 kg/m. GEN: Well nourished, well developed, in  no acute distress, Walks in a kyphotic position secondary to back pain , obese HEENT: normal  Neck: no JVD, carotid bruits, or masses Cardiac: RRR; no murmurs, rubs, or gallops,no edema  Respiratory:  clear to auscultation bilaterally, normal work of breathing GI: soft, nontender, nondistended, + BS MS:   Atrophy, kyphosis noted of his back  Skin: warm and dry, no rash Neuro:  Strength and sensation are intact Psych: euthymic mood, full affect    Recent Labs: No results found for requested labs within last 8760 hours.    Lipid Panel Lab Results  Component Value Date   CHOL 147 04/02/2015   HDL 38 (L) 04/02/2015   LDLCALC 95 04/02/2015   TRIG 70 04/02/2015      Wt Readings from Last 3 Encounters:  07/25/17 249 lb 12 oz (113.3 kg)  01/25/17 249 lb (112.9 kg)  12/20/16 250 lb 3.2 oz (113.5 kg)  ASSESSMENT AND PLAN:   Acute diastolic CHF (congestive heart failure) ([place]) - Plan: EKG 12-Lead Appears relatively euvolemic on today's visit, takes Lasix as needed for leg swelling, weight gain, shortness of breath.   Bilateral carotid artery stenosis - Plan: EKG 12-Lead Occluded carotids bilaterally Followed by Dr. Lucky Cowboy. Asymptomatic  Coronary artery disease involving native coronary artery of native heart without angina pectoris - Plan: EKG 12-Lead Currently with no symptoms of angina. No further workup at this time.  We will work on more aggressively managing his cholesterol Cholesterol management as below  Chronic atrial fibrillation ([place]) - Plan: EKG 12-Lead On anticoagulation, rate well controlled  Mixed hyperlipidemia - Plan: EKG 12-Lead recommend he change to Crestor 40 mg daily with Zetia If unable to tolerate this, would recommend praluent or repatha  Morbid obesity We have encouraged continued exercise, careful diet management in an effort to lose weight.   Total encounter time more than 25 minutes  Greater than 50% was spent in counseling and  coordination of care with the patient   Disposition:   F/U  12 months   Orders Placed This Encounter  Procedures  . EKG 12-Lead     Signed, Esmond Plants, M.D., Ph.D. 07/25/2017  [place], [place]

## 2017-07-25 ENCOUNTER — Encounter: Payer: Self-pay | Admitting: Cardiovascular Disease

## 2017-07-25 ENCOUNTER — Ambulatory Visit (INDEPENDENT_AMBULATORY_CARE_PROVIDER_SITE_OTHER): Payer: Medicare HMO | Admitting: Cardiovascular Disease

## 2017-07-25 VITALS — BP 136/80 | HR 77 | Ht 71.0 in | Wt 249.8 lb

## 2017-07-25 DIAGNOSIS — E782 Mixed hyperlipidemia: Secondary | ICD-10-CM

## 2017-07-25 DIAGNOSIS — I5031 Acute diastolic (congestive) heart failure: Secondary | ICD-10-CM

## 2017-07-25 DIAGNOSIS — I6523 Occlusion and stenosis of bilateral carotid arteries: Secondary | ICD-10-CM

## 2017-07-25 DIAGNOSIS — I2510 Atherosclerotic heart disease of native coronary artery without angina pectoris: Secondary | ICD-10-CM | POA: Insufficient documentation

## 2017-07-25 DIAGNOSIS — I25118 Atherosclerotic heart disease of native coronary artery with other forms of angina pectoris: Secondary | ICD-10-CM | POA: Diagnosis not present

## 2017-07-25 DIAGNOSIS — I482 Chronic atrial fibrillation, unspecified: Secondary | ICD-10-CM

## 2017-07-25 MED ORDER — ROSUVASTATIN CALCIUM 40 MG PO TABS: 40.0000 mg | ORAL_TABLET | Freq: Every day | ORAL | 11 refills | Status: DC

## 2017-07-25 NOTE — Patient Instructions (Addendum)
Medication Instructions:   Please stop the pravstatin Start crestor 40 mg daily  Labwork:  No new labs needed  Testing/Procedures:  No further testing at this time   Follow-Up: It was a pleasure seeing you in the office today. Please call us if you have new issues that need to be addressed before your next appt.  [phone]  Your physician wants you to follow-up in: 12 months.  You will receive a reminder letter in the mail two months in advance. If you don't receive a letter, please call our office to schedule the follow-up appointment.  If you need a refill on your cardiac medications before your next appointment, please call your pharmacy.

## 2017-07-26 DIAGNOSIS — M150 Primary generalized (osteo)arthritis: Secondary | ICD-10-CM | POA: Diagnosis not present

## 2017-07-26 DIAGNOSIS — G8929 Other chronic pain: Secondary | ICD-10-CM | POA: Diagnosis not present

## 2017-07-26 DIAGNOSIS — M25512 Pain in left shoulder: Secondary | ICD-10-CM | POA: Diagnosis not present

## 2017-07-26 DIAGNOSIS — M65312 Trigger thumb, left thumb: Secondary | ICD-10-CM | POA: Diagnosis not present

## 2017-07-26 DIAGNOSIS — M48061 Spinal stenosis, lumbar region without neurogenic claudication: Secondary | ICD-10-CM | POA: Diagnosis not present

## 2017-08-11 ENCOUNTER — Other Ambulatory Visit: Payer: Self-pay | Admitting: Cardiovascular Disease

## 2017-08-19 ENCOUNTER — Other Ambulatory Visit: Payer: Self-pay | Admitting: Cardiovascular Disease

## 2017-08-24 DIAGNOSIS — E559 Vitamin D deficiency, unspecified: Secondary | ICD-10-CM | POA: Diagnosis not present

## 2017-08-24 DIAGNOSIS — E7800 Pure hypercholesterolemia, unspecified: Secondary | ICD-10-CM | POA: Diagnosis not present

## 2017-08-24 DIAGNOSIS — N400 Enlarged prostate without lower urinary tract symptoms: Secondary | ICD-10-CM | POA: Diagnosis not present

## 2017-08-29 DIAGNOSIS — R7301 Impaired fasting glucose: Secondary | ICD-10-CM | POA: Diagnosis not present

## 2017-08-29 DIAGNOSIS — Z79899 Other long term (current) drug therapy: Secondary | ICD-10-CM | POA: Diagnosis not present

## 2017-08-29 DIAGNOSIS — M519 Unspecified thoracic, thoracolumbar and lumbosacral intervertebral disc disorder: Secondary | ICD-10-CM | POA: Diagnosis not present

## 2017-08-29 DIAGNOSIS — Z2820 Immunization not carried out because of patient decision for unspecified reason: Secondary | ICD-10-CM | POA: Diagnosis not present

## 2017-08-29 DIAGNOSIS — M419 Scoliosis, unspecified: Secondary | ICD-10-CM | POA: Diagnosis not present

## 2017-08-29 DIAGNOSIS — I480 Paroxysmal atrial fibrillation: Secondary | ICD-10-CM | POA: Diagnosis not present

## 2017-08-29 DIAGNOSIS — M4800 Spinal stenosis, site unspecified: Secondary | ICD-10-CM | POA: Diagnosis not present

## 2017-09-06 DIAGNOSIS — H5213 Myopia, bilateral: Secondary | ICD-10-CM | POA: Diagnosis not present

## 2017-09-06 DIAGNOSIS — Z0100 Encounter for examination of eyes and vision without abnormal findings: Secondary | ICD-10-CM | POA: Diagnosis not present

## 2017-09-21 DIAGNOSIS — R000 Tachycardia, unspecified: Secondary | ICD-10-CM | POA: Diagnosis not present

## 2017-09-21 DIAGNOSIS — I739 Peripheral vascular disease, unspecified: Secondary | ICD-10-CM | POA: Diagnosis not present

## 2017-09-21 DIAGNOSIS — E1159 Type 2 diabetes mellitus with other circulatory complications: Secondary | ICD-10-CM | POA: Diagnosis not present

## 2017-09-21 DIAGNOSIS — Z13220 Encounter for screening for lipoid disorders: Secondary | ICD-10-CM | POA: Diagnosis not present

## 2017-09-21 DIAGNOSIS — E1142 Type 2 diabetes mellitus with diabetic polyneuropathy: Secondary | ICD-10-CM | POA: Diagnosis not present

## 2017-09-22 ENCOUNTER — Emergency Department
Admission: EM | Admit: 2017-09-22 | Discharge: 2017-09-22 | Discharge status: Against Medical Advice | Payer: Medicare HMO | Attending: Emergency Medicine | Admitting: Emergency Medicine

## 2017-09-22 ENCOUNTER — Encounter: Payer: Self-pay | Admitting: Emergency Medicine

## 2017-09-22 ENCOUNTER — Emergency Department: Payer: Medicare HMO

## 2017-09-22 ENCOUNTER — Other Ambulatory Visit: Payer: Self-pay

## 2017-09-22 DIAGNOSIS — Z5320 Procedure and treatment not carried out because of patient's decision for unspecified reasons: Secondary | ICD-10-CM | POA: Insufficient documentation

## 2017-09-22 DIAGNOSIS — Z87891 Personal history of nicotine dependence: Secondary | ICD-10-CM | POA: Diagnosis not present

## 2017-09-22 DIAGNOSIS — Y939 Activity, unspecified: Secondary | ICD-10-CM | POA: Insufficient documentation

## 2017-09-22 DIAGNOSIS — M542 Cervicalgia: Secondary | ICD-10-CM | POA: Diagnosis not present

## 2017-09-22 DIAGNOSIS — S0990XA Unspecified injury of head, initial encounter: Secondary | ICD-10-CM

## 2017-09-22 DIAGNOSIS — Z7901 Long term (current) use of anticoagulants: Secondary | ICD-10-CM | POA: Insufficient documentation

## 2017-09-22 DIAGNOSIS — Z79899 Other long term (current) drug therapy: Secondary | ICD-10-CM | POA: Insufficient documentation

## 2017-09-22 DIAGNOSIS — S06310A Contusion and laceration of right cerebrum without loss of consciousness, initial encounter: Secondary | ICD-10-CM

## 2017-09-22 DIAGNOSIS — Y929 Unspecified place or not applicable: Secondary | ICD-10-CM | POA: Diagnosis not present

## 2017-09-22 DIAGNOSIS — W1789XA Other fall from one level to another, initial encounter: Secondary | ICD-10-CM | POA: Diagnosis not present

## 2017-09-22 DIAGNOSIS — S0101XA Laceration without foreign body of scalp, initial encounter: Secondary | ICD-10-CM | POA: Diagnosis not present

## 2017-09-22 DIAGNOSIS — S199XXA Unspecified injury of neck, initial encounter: Secondary | ICD-10-CM | POA: Diagnosis not present

## 2017-09-22 DIAGNOSIS — I5032 Chronic diastolic (congestive) heart failure: Secondary | ICD-10-CM | POA: Insufficient documentation

## 2017-09-22 DIAGNOSIS — Y999 Unspecified external cause status: Secondary | ICD-10-CM | POA: Diagnosis not present

## 2017-09-22 DIAGNOSIS — I2510 Atherosclerotic heart disease of native coronary artery without angina pectoris: Secondary | ICD-10-CM | POA: Insufficient documentation

## 2017-09-22 MED ORDER — LIDOCAINE HCL (PF) 1 % IJ SOLN
INTRAMUSCULAR | Status: AC
  Administered 2017-09-22: 5 mL via INTRADERMAL
  Filled 2017-09-22: qty 5

## 2017-09-22 MED ORDER — LIDOCAINE HCL (PF) 1 % IJ SOLN
5.0000 mL | Freq: Once | INTRAMUSCULAR | Status: AC
  Administered 2017-09-22: 5 mL via INTRADERMAL

## 2017-09-22 NOTE — ED Notes (Signed)
Pt doesn't want blood work until the doctor sees him. Will hold off on IV stick until Dr. Joni Fears talks to pt.

## 2017-09-22 NOTE — ED Notes (Signed)
C-collar removed by Dr. Joni Fears at this time.

## 2017-09-22 NOTE — ED Provider Notes (Signed)
[facility] Emergency Department Provider Note  ____________________________________________  Time seen: Approximately 7:48 PM  I have reviewed the triage vital signs and the nursing notes.   HISTORY  Chief Complaint Head Injury    HPI Jeremy Reese is a 66 y.o. male brought to the ED from work after a fall. Patient states he was on a step stool, fell backward and hit the back of his head on a sharp edge of a machine. Denies loss of consciousness. The patient is on Eliquis for stroke prevention of A. fib.also has some left-sided neck pain worse with movement. No paresthesias or peripheral weakness per no vision changes. He has occipital scalp pain at the area of the laceration, nonradiating, mild, no aggravating or alleviating factors.     Past Medical History:  Diagnosis Date  . Atrial flutter, paroxysmal ([place])   . Carotid artery disease without cerebral infarction ([place])   . Coronary artery disease 2011  . Flatback syndrome of thoracolumbar region   . H/O calcium pyrophosphate deposition disease (CPPD)   . History of TIA (transient ischemic attack)   . Hyperlipidemia   . Paroxysmal A-fib ([place])   . Spinal stenosis of lumbar region without neurogenic claudication      Patient Active Problem List   Diagnosis Date Noted  . Coronary artery disease of native artery of native heart with stable angina pectoris ([place]) 07/25/2017  . Acute diastolic CHF (congestive heart failure) ([place]) 04/29/2015  . Hyperlipidemia 04/01/2015  . Lumbago 04/01/2015  . Carotid stenosis 04/01/2015  . Chronic atrial fibrillation ([place]) 04/01/2015     Past Surgical History:  Procedure Laterality Date  . BILATERAL CARPAL TUNNEL RELEASE    . CARDIAC CATHETERIZATION    . CIRCUMCISION, NON-NEWBORN    . CORONARY ANGIOPLASTY WITH STENT PLACEMENT  2011     Prior to Admission medications   Medication Sig Start Date End Date Taking? Authorizing Provider  calcium gluconate 500  MG tablet Take 1 tablet by mouth daily.    [provider]  carvedilol (COREG) 6.25 MG tablet Take 1 tablet (6.25 mg total) by mouth 2 (two) times daily. 05/11/17   Minna Merritts, MD  Coenzyme Q10 (CO Q 10) 100 MG CAPS Take by mouth daily.    [provider]  colchicine 0.6 MG tablet Take 0.6 mg by mouth 2 (two) times daily as needed.    [provider]  digoxin (LANOXIN) 0.25 MG tablet TAKE ONE (1) TABLET BY MOUTH EVERY DAY 08/11/17   Minna Merritts, MD  DOCOSAHEXAENOIC ACID PO Take by mouth.    [provider]  ELIQUIS 5 MG TABS tablet TAKE ONE TABLET TWICE DAILY 08/19/17   Minna Merritts, MD  ezetimibe (ZETIA) 10 MG tablet TAKE ONE (1) TABLET EACH DAY 02/03/17   Minna Merritts, MD  furosemide (LASIX) 20 MG tablet TAKE TWO TABLET A DAY AS NEEDED Patient taking differently: 20 mg. TAKE TWO TABLET A DAY AS NEEDED 05/11/17   Minna Merritts, MD  gabapentin (NEURONTIN) 800 MG tablet Take 800 mg by mouth 3 (three) times daily.    [provider]  Magnesium Gluconate 250 MG TABS Take 250 mg by mouth daily.    [provider]  Melatonin 5 MG TABS Take 5 mg by mouth daily.    [provider]  nitroGLYCERIN (NITROSTAT) 0.4 MG SL tablet Place 1 tablet (0.4 mg total) under the tongue every 5 (five) minutes as needed for chest pain. 04/29/15  Minna Merritts, MD  potassium chloride (K-DUR) 10 MEQ tablet Take 1 tablet (10 mEq total) by mouth daily as needed. 04/29/15   Minna Merritts, MD  rosuvastatin (CRESTOR) 40 MG tablet Take 1 tablet (40 mg total) by mouth daily. 07/25/17   Minna Merritts, MD  traMADol (ULTRAM) 50 MG tablet Take 100 mg by mouth every 6 (six) hours as needed.    [provider]     Allergies Propafenone; Codeine; and Penicillins   Family History  Problem Relation Age of Onset  . Heart disease Mother 26       CABG x 3   . Hypertension Father     Social History Social History  Substance Use  Topics  . Smoking status: Former Smoker    Packs/day: 0.25    Years: 40.00    Types: Cigarettes  . Smokeless tobacco: Never Used  . Alcohol use Yes     Comment: rare     Review of Systems  Constitutional:   No fever or chills.  ENT:   No sore throat. No rhinorrhea. Cardiovascular:   No chest pain or syncope. Respiratory:   No dyspnea or cough. Gastrointestinal:   Negative for abdominal pain, vomiting and diarrhea.  Musculoskeletal:   Negative for focal pain or swelling All other systems reviewed and are negative except as documented above in ROS and HPI.  ____________________________________________   PHYSICAL EXAM:  VITAL SIGNS: ED Triage Vitals  Enc Vitals Group     BP 09/22/17 1605 (!) 157/120     Pulse Rate 09/22/17 1605 79     Resp 09/22/17 1605 18     Temp 09/22/17 1605 98.2 F (36.8 C)     Temp Source 09/22/17 1605 Oral     SpO2 09/22/17 1605 97 %     Weight 09/22/17 1605 250 lb (113.4 kg)     Height 09/22/17 1605 5\' 10"  (1.778 m)     Head Circumference --      Peak Flow --      Pain Score 09/22/17 1610 5     Pain Loc --      Pain Edu? --      Excl. in [place]? --     Vital signs reviewed, nursing assessments reviewed.   Constitutional:   Alert and oriented. Well appearing and in no distress. Eyes:   No scleral icterus.  EOMI. No nystagmus. No conjunctival pallor. PERRL. ENT   Head:   Normocephalicwith 7 cm linear scalp laceration at the occiput.   Nose:   No congestion/rhinnorhea.    Mouth/Throat:   MMM, no pharyngeal erythema. No peritonsillar mass.    Neck:  .no midline tenderness. C-collar in place Hematological/Lymphatic/Immunilogical:   No cervical lymphadenopathy. Cardiovascular:   irregularly irregular rhythm, rate of 90. Symmetric bilateral radial and DP pulses.  No murmurs.  Respiratory:   Normal respiratory effort without tachypnea/retractions. Breath sounds are clear and equal bilaterally. No wheezes/rales/rhonchi. Gastrointestinal:    Soft and nontender. Non distended. There is no CVA tenderness.  No rebound, rigidity, or guarding. Genitourinary:   deferred Musculoskeletal:   Normal range of motion in all extremities. No joint effusions.  No lower extremity tenderness.  No edema. Neurologic:   Normal speech and language.  Motor grossly intact. normal gait  and cranial nerves III through XII intact Normal finger to nose No pronator drift Good conversation, pleasant interactions, linear and coherent thought process No gross focal neurologic deficits are appreciated.  Skin:  Skin is warm, dry and intact. No rash noted.  No petechiae, purpura, or bullae.  ____________________________________________    LABS (pertinent positives/negatives) (all labs ordered are listed, but only abnormal results are displayed) Labs Reviewed - No data to display ____________________________________________   EKG  interpreted by me Atrial fibrillation rate of 92, normal axis, normal QRS and ST segments. T wave inversions in inferior and lateral leads, unchanged from 07/25/2017.  ____________________________________________    RADIOLOGY  Ct Head Wo Contrast  Result Date: 09/22/2017 CLINICAL DATA:  Head trauma, fell and hit head. No loss of consciousness. Neck pain. Patient anticoagulation. EXAM: CT HEAD WITHOUT CONTRAST CT CERVICAL SPINE WITHOUT CONTRAST TECHNIQUE: Multidetector CT imaging of the head and cervical spine was performed following the standard protocol without intravenous contrast. Multiplanar CT image reconstructions of the cervical spine were also generated. COMPARISON:  01/28/2014. FINDINGS: CT HEAD FINDINGS Brain: There are two small cortical contusions, superior and anterior RIGHT frontal lobe, consistent with an acute closed contrecoup head injury. These are subcentimeter in size, largest dimension up to 6 mm. Reference axial series 2, image 20, coronal series 4, image 23, and sagittal series 5, image 20. No  significant mass effect, shift, or edema. No evidence for acute stroke, mass lesion, or hydrocephalus. Prominence of the extracerebral CSF spaces over the frontal and temporal lobes represent atrophy, stable from 2015. No evidence for subdural hematoma or hygroma. Vascular: Calcification of the cavernous internal carotid arteries consistent with cerebrovascular atherosclerotic disease. No signs of intracranial large vessel occlusion. Skull: Posterior scalp laceration and hematoma near the midline. No underlying skull fracture or diastasis. Sinuses/Orbits: Sinuses are clear.  No orbital findings. Other: Well-aerated mastoids. CT CERVICAL SPINE FINDINGS Alignment: Anatomic. Skull base and vertebrae: No acute fracture. No primary bone lesion or focal pathologic process. Soft tissues and spinal canal: No prevertebral fluid or swelling. No visible canal hematoma. Advanced atherosclerosis . Disc levels: Advanced disc space narrowing at C6-C7. Calcified annulus/OPLL in the midline, minor, at C4-C5. No traumatic disc herniation is evident. Upper chest: No pneumothorax or upper rib fracture. Other: None. IMPRESSION: Posterior scalp laceration with contrecoup injury, small shearing injuries to the RIGHT frontal lobe. No skull fracture. No cervical spine fracture or traumatic subluxation. Critical Value/emergent results were called by telephone at the time of interpretation on 09/22/2017 at 4:35 pm to Dr. Alfred Levins, who verbally acknowledged these results. Electronically Signed   By: Staci Righter M.D.   On: 09/22/2017 16:41   Ct Cervical Spine Wo Contrast  Result Date: 09/22/2017 CLINICAL DATA:  Head trauma, fell and hit head. No loss of consciousness. Neck pain. Patient anticoagulation. EXAM: CT HEAD WITHOUT CONTRAST CT CERVICAL SPINE WITHOUT CONTRAST TECHNIQUE: Multidetector CT imaging of the head and cervical spine was performed following the standard protocol without intravenous contrast. Multiplanar CT image  reconstructions of the cervical spine were also generated. COMPARISON:  01/28/2014. FINDINGS: CT HEAD FINDINGS Brain: There are two small cortical contusions, superior and anterior RIGHT frontal lobe, consistent with an acute closed contrecoup head injury. These are subcentimeter in size, largest dimension up to 6 mm. Reference axial series 2, image 20, coronal series 4, image 23, and sagittal series 5, image 20. No significant mass effect, shift, or edema. No evidence for acute stroke, mass lesion, or hydrocephalus. Prominence of the extracerebral CSF spaces over the frontal and temporal lobes represent atrophy, stable from 2015. No evidence for subdural hematoma or hygroma. Vascular: Calcification of the cavernous internal carotid arteries consistent with cerebrovascular atherosclerotic disease. No  signs of intracranial large vessel occlusion. Skull: Posterior scalp laceration and hematoma near the midline. No underlying skull fracture or diastasis. Sinuses/Orbits: Sinuses are clear.  No orbital findings. Other: Well-aerated mastoids. CT CERVICAL SPINE FINDINGS Alignment: Anatomic. Skull base and vertebrae: No acute fracture. No primary bone lesion or focal pathologic process. Soft tissues and spinal canal: No prevertebral fluid or swelling. No visible canal hematoma. Advanced atherosclerosis . Disc levels: Advanced disc space narrowing at C6-C7. Calcified annulus/OPLL in the midline, minor, at C4-C5. No traumatic disc herniation is evident. Upper chest: No pneumothorax or upper rib fracture. Other: None. IMPRESSION: Posterior scalp laceration with contrecoup injury, small shearing injuries to the RIGHT frontal lobe. No skull fracture. No cervical spine fracture or traumatic subluxation. Critical Value/emergent results were called by telephone at the time of interpretation on 09/22/2017 at 4:35 pm to Dr. Alfred Levins, who verbally acknowledged these results. Electronically Signed   By: Staci Righter M.D.   On:  09/22/2017 16:41    ____________________________________________   PROCEDURES Procedures LACERATION REPAIR Performed by: Carrie Mew Authorized by: Carrie Mew Consent: Verbal consent obtained. Risks and benefits: risks, benefits and alternatives were discussed Consent given by: patient Patient identity confirmed: provided demographic data Prepped and Draped in normal sterile fashion Wound explored  Laceration Location: Occipital scalp  Laceration Length: 7cm  No Foreign Bodies seen or palpated  Anesthesia: local infiltration  Local anesthetic: lidocaine 1% without epinephrine  Anesthetic total: 5 ml  Irrigation method: syringe Amount of cleaning: standard  Skin closure: 4-0 monocryl  Number of sutures: 9  Technique: running  Patient tolerance: Patient tolerated the procedure well with no immediate complications.  ____________________________________________   DIFFERENTIAL DIAGNOSIS  Cerebral contusion, TBI  CLINICAL IMPRESSION / ASSESSMENT AND PLAN / ED COURSE  Pertinent labs & imaging results that were available during my care of the patient were reviewed by me and considered in my medical decision making (see chart for details).   patient presents with occipital scalp laceration. He has a contrecoup injury with cerebral contusion in the right frontal lobe. Neurologically intact, no symptoms except for pain at the laceration site. We'll discuss with neurosurgery recommendations.after his negative CT, patient able to range the neck, C-spine is stable.  Clinical Course as of Sep 23 1947  Thu Sep 22, 2017  1842 D/w NSGY Dr. Izora Ribas, who recommends transfer and will coordinate care with [facility].  However, on discussing plan with patient, he refuses.  He has MDM capacity and is lucid, but refuses to be hospitalized overnight.   [PS]    Clinical Course User Index [PS] Carrie Mew, MD    ----------------------------------------- 7:50 PM on  09/22/2017 -----------------------------------------  Laceration repaired. Patient has a steady gait, still insists on going home [place]. Return precautions for any worsening of symptoms or condition. Advised to hold his Eliquis for 2 days.   ____________________________________________   FINAL CLINICAL IMPRESSION(S) / ED DIAGNOSES    Final diagnoses:  Contusion of right cerebral hemisphere, without loss of consciousness, initial encounter ([place])  Scalp laceration, initial encounter  Injury of head, initial encounter      New Prescriptions   No medications on file     Portions of this note were generated with dragon dictation software. Dictation errors may occur despite best attempts at proofreading.    Carrie Mew, MD 09/22/17 [phone]

## 2017-09-22 NOTE — ED Notes (Addendum)
Pt states he was on a step stool working on a machine. States he fell backward onto another piece of machine, it was metal. States tetanus within last 5 years. Pt is alert and oriented. Was ambulating. Arrives to room 7 in c-collar. States he landed on L side as well, denies injury to L side. 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 States L sided neck is sore. Has a 2 inch straight lac to back of head, has gauzed wrapped around head. Pt moving all extremities. States hx a fib, takes eliquis for it. Also has 1 stent placed. Denies LOC when he fell.   Pt was 85-88% on RA, placed on 2 L. Now at 99% on 2 L. Will continue to monitor oxygen.   Pt states no hx of HTN. Pt BP has increased since triage.

## 2017-09-22 NOTE — ED Triage Notes (Signed)
Patient presents to the ED with head trauma.  Patient states he was standing on a small ladder, approx. 2 feet off the ground and fell off and hit his head on a piece of machinery.  Patient states he did not lose consciousness.  Patient has bandage to the back of his head.  Patient is complaining of head and neck pain.  Patient takes eliquis.  Patient states he did not feel dizzy prior to fall but felt that his feet got caught up in the ladder.

## 2017-09-22 NOTE — ED Notes (Signed)
Pt ambulatory to toilet

## 2017-09-22 NOTE — ED Notes (Signed)
No blood work at this time per Dr. Stafford. 

## 2017-09-22 NOTE — ED Notes (Signed)
Dr. Joni Fears at bedside to suture up pt posterior head lac.

## 2017-09-22 NOTE — Discharge Instructions (Signed)
Your CT scan of the brain shows cerebral contusion, which is an area where the brain is bruised.  Follow up with your doctor tomorrow. The neurosurgeon recommended staying in the hospital overnight, but you chose to leave and go home tonight.  Return to the hospital immediately if you have any worsening of your symptoms or condition.

## 2017-09-22 NOTE — ED Notes (Signed)
CALLED  DR  Izora Ribas MD ([facility]) PER  DR  Joni Fears MD  INFORMED MD  OF  PT  LEAVING  AMA

## 2017-09-27 DIAGNOSIS — H5213 Myopia, bilateral: Secondary | ICD-10-CM | POA: Diagnosis not present

## 2017-09-27 DIAGNOSIS — H52223 Regular astigmatism, bilateral: Secondary | ICD-10-CM | POA: Diagnosis not present

## 2017-09-27 DIAGNOSIS — H43811 Vitreous degeneration, right eye: Secondary | ICD-10-CM | POA: Diagnosis not present

## 2017-10-18 DIAGNOSIS — M419 Scoliosis, unspecified: Secondary | ICD-10-CM | POA: Diagnosis not present

## 2017-10-18 DIAGNOSIS — M4800 Spinal stenosis, site unspecified: Secondary | ICD-10-CM | POA: Diagnosis not present

## 2017-10-18 DIAGNOSIS — R7303 Prediabetes: Secondary | ICD-10-CM | POA: Diagnosis not present

## 2017-10-18 DIAGNOSIS — M792 Neuralgia and neuritis, unspecified: Secondary | ICD-10-CM | POA: Diagnosis not present

## 2017-11-15 ENCOUNTER — Other Ambulatory Visit: Payer: Self-pay | Admitting: Cardiovascular Disease

## 2017-12-08 DIAGNOSIS — R7303 Prediabetes: Secondary | ICD-10-CM | POA: Diagnosis not present

## 2017-12-08 DIAGNOSIS — I749 Embolism and thrombosis of unspecified artery: Secondary | ICD-10-CM | POA: Diagnosis not present

## 2017-12-08 DIAGNOSIS — M419 Scoliosis, unspecified: Secondary | ICD-10-CM | POA: Diagnosis not present

## 2017-12-08 DIAGNOSIS — E559 Vitamin D deficiency, unspecified: Secondary | ICD-10-CM | POA: Diagnosis not present

## 2017-12-08 DIAGNOSIS — M109 Gout, unspecified: Secondary | ICD-10-CM | POA: Diagnosis not present

## 2017-12-08 DIAGNOSIS — E7800 Pure hypercholesterolemia, unspecified: Secondary | ICD-10-CM | POA: Diagnosis not present

## 2017-12-26 ENCOUNTER — Other Ambulatory Visit: Payer: Self-pay | Admitting: Cardiovascular Disease

## 2017-12-26 NOTE — Telephone Encounter (Signed)
Please review for refill, thanks ! 

## 2018-01-11 DIAGNOSIS — L570 Actinic keratosis: Secondary | ICD-10-CM | POA: Diagnosis not present

## 2018-01-11 DIAGNOSIS — L400 Psoriasis vulgaris: Secondary | ICD-10-CM | POA: Diagnosis not present

## 2018-01-11 DIAGNOSIS — D1800 Hemangioma unspecified site: Secondary | ICD-10-CM | POA: Diagnosis not present

## 2018-01-11 DIAGNOSIS — L578 Other skin changes due to chronic exposure to nonionizing radiation: Secondary | ICD-10-CM | POA: Diagnosis not present

## 2018-01-11 DIAGNOSIS — D692 Other nonthrombocytopenic purpura: Secondary | ICD-10-CM | POA: Diagnosis not present

## 2018-01-11 DIAGNOSIS — L820 Inflamed seborrheic keratosis: Secondary | ICD-10-CM | POA: Diagnosis not present

## 2018-01-11 DIAGNOSIS — D229 Melanocytic nevi, unspecified: Secondary | ICD-10-CM | POA: Diagnosis not present

## 2018-01-11 DIAGNOSIS — Z1283 Encounter for screening for malignant neoplasm of skin: Secondary | ICD-10-CM | POA: Diagnosis not present

## 2018-01-11 DIAGNOSIS — I8393 Asymptomatic varicose veins of bilateral lower extremities: Secondary | ICD-10-CM | POA: Diagnosis not present

## 2018-01-20 ENCOUNTER — Telehealth: Payer: Self-pay | Admitting: Cardiovascular Disease

## 2018-01-20 NOTE — Telephone Encounter (Signed)
Fax received from [employer] asking for [place] to do a prior authorization for Digoxin. I called the patient to confirm the dosage and asked if he has been getting his digoxin filled as brand or generic. The patient states it has been getting filled as brand name, but he was ok with generic. I advised him I will send his PA through as generic. He is agreeable. PA submitted- no longer required a PA. I called [facility] and inquired if they were filling this for the patient- they stated they filled this for him on 01/14/18 for generic. I advised I had spoken with the patient this morning and generic was ok with him and no longer required a PA.

## 2018-02-17 ENCOUNTER — Other Ambulatory Visit: Payer: Self-pay | Admitting: Cardiovascular Disease

## 2018-03-09 DIAGNOSIS — N390 Urinary tract infection, site not specified: Secondary | ICD-10-CM | POA: Diagnosis not present

## 2018-03-09 DIAGNOSIS — E559 Vitamin D deficiency, unspecified: Secondary | ICD-10-CM | POA: Diagnosis not present

## 2018-03-09 DIAGNOSIS — M419 Scoliosis, unspecified: Secondary | ICD-10-CM | POA: Diagnosis not present

## 2018-03-09 DIAGNOSIS — E1165 Type 2 diabetes mellitus with hyperglycemia: Secondary | ICD-10-CM | POA: Diagnosis not present

## 2018-03-09 DIAGNOSIS — R7989 Other specified abnormal findings of blood chemistry: Secondary | ICD-10-CM | POA: Diagnosis not present

## 2018-03-09 DIAGNOSIS — R300 Dysuria: Secondary | ICD-10-CM | POA: Diagnosis not present

## 2018-03-09 DIAGNOSIS — E785 Hyperlipidemia, unspecified: Secondary | ICD-10-CM | POA: Diagnosis not present

## 2018-03-09 DIAGNOSIS — Z79899 Other long term (current) drug therapy: Secondary | ICD-10-CM | POA: Diagnosis not present

## 2018-03-09 DIAGNOSIS — M109 Gout, unspecified: Secondary | ICD-10-CM | POA: Diagnosis not present

## 2018-03-09 DIAGNOSIS — E119 Type 2 diabetes mellitus without complications: Secondary | ICD-10-CM | POA: Diagnosis not present

## 2018-03-09 DIAGNOSIS — N400 Enlarged prostate without lower urinary tract symptoms: Secondary | ICD-10-CM | POA: Diagnosis not present

## 2018-03-09 DIAGNOSIS — R7303 Prediabetes: Secondary | ICD-10-CM | POA: Diagnosis not present

## 2018-03-09 DIAGNOSIS — I10 Essential (primary) hypertension: Secondary | ICD-10-CM | POA: Diagnosis not present

## 2018-03-09 DIAGNOSIS — R5382 Chronic fatigue, unspecified: Secondary | ICD-10-CM | POA: Diagnosis not present

## 2018-03-09 DIAGNOSIS — R7301 Impaired fasting glucose: Secondary | ICD-10-CM | POA: Diagnosis not present

## 2018-03-09 DIAGNOSIS — M519 Unspecified thoracic, thoracolumbar and lumbosacral intervertebral disc disorder: Secondary | ICD-10-CM | POA: Diagnosis not present

## 2018-03-09 DIAGNOSIS — Z0000 Encounter for general adult medical examination without abnormal findings: Secondary | ICD-10-CM | POA: Diagnosis not present

## 2018-03-09 DIAGNOSIS — G894 Chronic pain syndrome: Secondary | ICD-10-CM | POA: Diagnosis not present

## 2018-03-09 DIAGNOSIS — E7800 Pure hypercholesterolemia, unspecified: Secondary | ICD-10-CM | POA: Diagnosis not present

## 2018-04-12 ENCOUNTER — Other Ambulatory Visit: Payer: Self-pay | Admitting: Cardiovascular Disease

## 2018-04-29 ENCOUNTER — Other Ambulatory Visit: Payer: Self-pay | Admitting: Cardiovascular Disease

## 2018-05-01 NOTE — Telephone Encounter (Signed)
Refill Request.  

## 2018-05-25 ENCOUNTER — Other Ambulatory Visit: Payer: Self-pay | Admitting: Cardiovascular Disease

## 2018-06-12 DIAGNOSIS — M40209 Unspecified kyphosis, site unspecified: Secondary | ICD-10-CM | POA: Diagnosis not present

## 2018-06-12 DIAGNOSIS — R7989 Other specified abnormal findings of blood chemistry: Secondary | ICD-10-CM | POA: Diagnosis not present

## 2018-06-12 DIAGNOSIS — R739 Hyperglycemia, unspecified: Secondary | ICD-10-CM | POA: Diagnosis not present

## 2018-06-12 DIAGNOSIS — M4800 Spinal stenosis, site unspecified: Secondary | ICD-10-CM | POA: Diagnosis not present

## 2018-06-12 DIAGNOSIS — M419 Scoliosis, unspecified: Secondary | ICD-10-CM | POA: Diagnosis not present

## 2018-06-12 DIAGNOSIS — R5383 Other fatigue: Secondary | ICD-10-CM | POA: Diagnosis not present

## 2018-06-12 DIAGNOSIS — I749 Embolism and thrombosis of unspecified artery: Secondary | ICD-10-CM | POA: Diagnosis not present

## 2018-06-12 DIAGNOSIS — R7303 Prediabetes: Secondary | ICD-10-CM | POA: Diagnosis not present

## 2018-06-12 DIAGNOSIS — M519 Unspecified thoracic, thoracolumbar and lumbosacral intervertebral disc disorder: Secondary | ICD-10-CM | POA: Diagnosis not present

## 2018-06-12 DIAGNOSIS — Z79899 Other long term (current) drug therapy: Secondary | ICD-10-CM | POA: Diagnosis not present

## 2018-06-12 DIAGNOSIS — E7800 Pure hypercholesterolemia, unspecified: Secondary | ICD-10-CM | POA: Diagnosis not present

## 2018-06-12 DIAGNOSIS — E559 Vitamin D deficiency, unspecified: Secondary | ICD-10-CM | POA: Diagnosis not present

## 2018-06-12 DIAGNOSIS — I10 Essential (primary) hypertension: Secondary | ICD-10-CM | POA: Diagnosis not present

## 2018-06-21 DIAGNOSIS — M79661 Pain in right lower leg: Secondary | ICD-10-CM | POA: Diagnosis not present

## 2018-06-21 DIAGNOSIS — G603 Idiopathic progressive neuropathy: Secondary | ICD-10-CM | POA: Diagnosis not present

## 2018-06-21 DIAGNOSIS — G608 Other hereditary and idiopathic neuropathies: Secondary | ICD-10-CM | POA: Diagnosis not present

## 2018-07-13 DIAGNOSIS — M419 Scoliosis, unspecified: Secondary | ICD-10-CM | POA: Diagnosis not present

## 2018-07-13 DIAGNOSIS — G894 Chronic pain syndrome: Secondary | ICD-10-CM | POA: Diagnosis not present

## 2018-07-13 DIAGNOSIS — M4800 Spinal stenosis, site unspecified: Secondary | ICD-10-CM | POA: Diagnosis not present

## 2018-08-30 DIAGNOSIS — G253 Myoclonus: Secondary | ICD-10-CM | POA: Diagnosis not present

## 2018-08-30 DIAGNOSIS — Z6834 Body mass index (BMI) 34.0-34.9, adult: Secondary | ICD-10-CM | POA: Diagnosis not present

## 2018-08-30 DIAGNOSIS — D649 Anemia, unspecified: Secondary | ICD-10-CM | POA: Diagnosis not present

## 2018-09-20 DIAGNOSIS — R9401 Abnormal electroencephalogram [EEG]: Secondary | ICD-10-CM | POA: Diagnosis not present

## 2018-09-20 DIAGNOSIS — G253 Myoclonus: Secondary | ICD-10-CM | POA: Diagnosis not present

## 2018-09-20 DIAGNOSIS — R569 Unspecified convulsions: Secondary | ICD-10-CM | POA: Diagnosis not present

## 2018-10-19 ENCOUNTER — Other Ambulatory Visit: Payer: Self-pay | Admitting: Cardiovascular Disease

## 2018-12-03 NOTE — Progress Notes (Signed)
Cardiology Office Note  Date:  12/05/2018   ID:  Jeremy Reese, Jeremy Reese February 05, 1951, MRN [id number]  PCP:  Remi Haggard, FNP   Chief Complaint  Patient presents with  . other    12 mo follow up. Medication reviewed verbally.     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 Lucky Cowboy,  atrial fibrillation dating back to 2011  severe back pain not a surgical candidate per the surgeons given his severe bilateral carotid disease Old CVA on CT scan head in 2015 who presents for follow-up of his atrial fibrillation  No regular exercise program Weight continues to run high but he is watching his diet No chest pain on exertion Denies any significant shortness of breath "I have leaky gut", takes a supplement for this that he found Taking a keto pill  Lab work reviewed with him Total chol 120 LDL 54  Prior echocardiogram and carotid ultrasound done at outside facilities 2017, reviewed with him in detail  cardiac catheterization 01/20/2009 with 95% proximal circumflex, 60% mid LAD. PCI of the left circumflex vessel  continued chronic pain of his back Takes tramadol   Tolerating crestor 40 mg daily with Zetia Lipitor myalgias  currentlyDenies having significant leg edema Does not take Lasix  EKG personally reviewed by myself on todays visit  shows atrial fibrillation with ventricular rate 60 bpm, PVC, nonspecific ST abnormality anterolateral leads V3 through V6, 1 and aVL  Other past medical history In the past he has tried higher dose pravastatin 80 mg and had side effects including fatigue, weakness, muscle ache. He is able to tolerate 40 mg daily.  In terms of his other medical issues, he has a "flatback" and per his report has spinal stenosis, scoliosis and chronic pain. He is scheduled to see [facility]. He reports tramadol  helps to some degree but still has significant breakthrough pain. He is very limited in his ability to exert himself or ambulate secondary to his back.  Carotid ultrasound reviewed with him from 01/14/2015 showing bilateral internal carotid occlusion, no significant change from ultrasound dated June 2015  Prior studies show cardioversion 02/25/2015 Transesophageal echo 08/11/2010 with PFO noted, ejection fraction 50%, left atrium moderately dilated, moderate MR, mild to moderate TR   PMH:   has a past medical history of Atrial flutter, paroxysmal ([facility]remy Reese), Carotid artery disease without cerebral infarction [facility]), Coronary artery disease (2011), Flatback syndrome of thoracolumbar region, H/O calcium pyrophosphate deposition disease (CPPD), History of TIA (transient ischemic attack), Hyperlipidemia, Paroxysmal A-fib (Jeremy Reese), and Spinal stenosis of lumbar region without neurogenic claudication.  PSH:    Past Surgical History:  Procedure Laterality Date  . BILATERAL CARPAL TUNNEL RELEASE    . CARDIAC CATHETERIZATION    . CIRCUMCISION, NON-NEWBORN    . CORONARY ANGIOPLASTY WITH STENT PLACEMENT  2011    Current Outpatient Medications  Medication Sig Dispense Refill  . calcium gluconate 500 MG tablet Take 1 tablet by mouth daily.    . carvedilol (COREG) 6.25 MG tablet Take 1 tablet (6.25 mg total) by mouth 2 (two) times daily. 180 tablet 0  . Coenzyme Q10 (CO Q 10) 100 MG CAPS Take by mouth daily.    . colchicine 0.6 MG tablet Take 0.6 mg by mouth 2 (two) times daily as needed.    . digoxin (LANOXIN) 0.25 MG tablet TAKE  ONE (1) TABLET BY MOUTH EVERY DAY 30 tablet 0  . DOCOSAHEXAENOIC ACID PO Take by mouth.    [name] 5 MG TABS tablet TAKE ONE TABLET TWICE DAILY 60 tablet 1  . ezetimibe (ZETIA) 10 MG tablet TAKE ONE (1) TABLET EACH DAY 30 tablet 3  . furosemide (LASIX) 20 MG tablet TAKE TWO TABLET A DAY AS NEEDED (Patient taking differently: 20 mg. TAKE TWO TABLET A DAY AS NEEDED) 180 tablet 3   . gabapentin (NEURONTIN) 800 MG tablet Take 800 mg by mouth 3 (three) times daily.    . Magnesium Gluconate 250 MG TABS Take 250 mg by mouth daily.    . Melatonin 5 MG TABS Take 5 mg by mouth daily.    . nitroGLYCERIN (NITROSTAT) 0.4 MG SL tablet Place 1 tablet (0.4 mg total) under the tongue every 5 (five) minutes as needed for chest pain. 25 tablet 3  . potassium chloride (K-DUR) 10 MEQ tablet Take 1 tablet (10 mEq total) by mouth daily as needed. 90 tablet 6  . rosuvastatin (CRESTOR) 40 MG tablet Take 1 tablet (40 mg total) by mouth daily. 30 tablet 11  . traMADol (ULTRAM) 50 MG tablet Take 100 mg by mouth every 6 (six) hours as needed.    Marland Kitchen aspirin EC 81 MG tablet Take 1 tablet (81 mg total) by mouth daily. 90 tablet 3  . Omega-3 1000 MG CAPS Take by mouth.     No current facility-administered medications for this visit.      Allergies:   Propafenone; Codeine; and Penicillins   Social History:  The patient  reports that he has quit smoking. His smoking use included cigarettes. He has a 10.00 pack-year smoking history. He has never used smokeless tobacco. He reports current alcohol use. He reports that he does not use drugs.   Family History:   family history includes Heart disease (age of onset: 57) in his mother; Hypertension in his father.    Review of Systems: Review of Systems  Constitutional: Negative.   Respiratory: Negative.   Cardiovascular: Negative.   Gastrointestinal: Negative.   Musculoskeletal: Positive for back pain.  Neurological: Negative.   All other systems reviewed and are negative.    PHYSICAL EXAM: VS:  BP (!) 144/84 (BP Location: Left Arm, Patient Position: Sitting, Cuff Size: Normal)   Pulse 60   Ht 5\' 6"  (1.676 m)   Wt 243 lb (110.2 kg)   BMI 39.22 kg/m  , BMI Body mass index is 39.22 kg/m. Constitutional:  oriented to person, place, and time. No distress.  HENT:  Head: Grossly normal Eyes:  no discharge. No scleral icterus.  Neck: No JVD, no  carotid bruits  Cardiovascular: Regular rate and rhythm, no murmurs appreciated Pulmonary/Chest: Clear to auscultation bilaterally, no wheezes or rails Abdominal: Soft.  no distension.  no tenderness.  Musculoskeletal: Normal range of motion Neurological:  normal muscle tone. Coordination normal. No atrophy Skin: Skin warm and dry Psychiatric: normal affect, pleasant  Recent Labs: No results found for requested labs within last 8760 hours.    Lipid Panel Lab Results  Component Value Date   CHOL 147 04/02/2015   HDL 38 (L) 04/02/2015   LDLCALC 95 04/02/2015   TRIG 70 04/02/2015      Wt Readings from Last 3 Encounters:  12/05/18 243 lb (110.2 kg)  09/22/17 250 lb (113.4 kg)  07/25/17 249 lb 12 oz (113.3 kg)     ASSESSMENT AND PLAN:  Acute diastolic CHF (congestive  heart failure) ([place]) - Plan: EKG 12-Lead Euvolemic on today's visit No leg swelling, lungs clear weight stable if not down  Bilateral carotid artery stenosis - Plan: EKG 12-Lead Occluded carotids bilaterally Previously seen by Dr. Lucky Cowboy, surgery was not offered  Coronary artery disease involving native coronary artery of native heart without angina pectoris - Plan: EKG 12-Lead Denies any anginal symptoms Given prior stent 10 years ago probably should be on antiplatelet therapy, recommended he start aspirin 81 mg daily  Chronic atrial fibrillation ([place]) - Plan: EKG 12-Lead On anticoagulation, rate well controlled Tolerating Eliquis  Mixed hyperlipidemia - Plan: EKG 12-Lead  Crestor 40 mg daily with Zetia Numbers at goal  Morbid obesity We have encouraged continued exercise, careful diet management in an effort to lose weight.  Recommended low carbohydrates   Total encounter time more than 25 minutes  Greater than 50% was spent in counseling and coordination of care with the patient   Disposition:   F/U  12 months   Orders Placed This Encounter  Procedures  . EKG 12-Lead     Signed, Esmond Plants,  M.D., Ph.D. 12/05/2018  [place], [place]

## 2018-12-05 ENCOUNTER — Ambulatory Visit: Payer: Medicare HMO | Admitting: Cardiovascular Disease

## 2018-12-05 ENCOUNTER — Encounter: Payer: Self-pay | Admitting: Cardiovascular Disease

## 2018-12-05 VITALS — BP 144/84 | HR 60 | Ht 66.0 in | Wt 243.0 lb

## 2018-12-05 DIAGNOSIS — I482 Chronic atrial fibrillation, unspecified: Secondary | ICD-10-CM

## 2018-12-05 DIAGNOSIS — Z23 Encounter for immunization: Secondary | ICD-10-CM | POA: Diagnosis not present

## 2018-12-05 DIAGNOSIS — E782 Mixed hyperlipidemia: Secondary | ICD-10-CM

## 2018-12-05 DIAGNOSIS — I6523 Occlusion and stenosis of bilateral carotid arteries: Secondary | ICD-10-CM | POA: Diagnosis not present

## 2018-12-05 DIAGNOSIS — I5031 Acute diastolic (congestive) heart failure: Secondary | ICD-10-CM | POA: Diagnosis not present

## 2018-12-05 DIAGNOSIS — I25118 Atherosclerotic heart disease of native coronary artery with other forms of angina pectoris: Secondary | ICD-10-CM | POA: Diagnosis not present

## 2018-12-05 MED ORDER — ASPIRIN EC 81 MG PO TBEC: 81.0000 mg | DELAYED_RELEASE_TABLET | Freq: Every day | ORAL | 3 refills | Status: AC

## 2018-12-05 NOTE — Patient Instructions (Addendum)
For back pain [place]  [place],  [id number]  Office: [phone]   Medication Instructions:  Start asa 81 mg daily given old stent 2010  If you need a refill on your cardiac medications before your next appointment, please call your pharmacy.   Lab work: No new labs needed   If you have labs (blood work) drawn today and your tests are completely normal, you will receive your results only by: Marland Kitchen MyChart Message (if you have MyChart) OR . A paper copy in the mail If you have any lab test that is abnormal or we need to change your treatment, we will call you to review the results.   Testing/Procedures: No new testing needed   Follow-Up: At [facility], you and your health needs are our priority.  As part of our continuing mission to provide you with exceptional heart care, we have created designated Provider Care Teams.  These Care Teams include your primary Cardiologist (physician) and Advanced Practice Providers (APPs -  Physician Assistants and Nurse Practitioners) who all work together to provide you with the care you need, when you need it.  . You will need a follow up appointment in 12 months .   Please call our office 2 months in advance to schedule this appointment.    . Providers on your designated Care Team:   . Murray Hodgkins, NP . Christell Faith, PA-C . Marrianne Mood, PA-C  Any Other Special Instructions Will Be Listed Below (If Applicable).  For educational health videos Log in to : [email or web address] Or : [email or web address], password : [email or web address]

## 2018-12-21 ENCOUNTER — Other Ambulatory Visit: Payer: Self-pay | Admitting: Cardiovascular Disease

## 2019-01-03 DIAGNOSIS — Z6834 Body mass index (BMI) 34.0-34.9, adult: Secondary | ICD-10-CM | POA: Diagnosis not present

## 2019-01-03 DIAGNOSIS — G253 Myoclonus: Secondary | ICD-10-CM | POA: Diagnosis not present

## 2019-01-03 DIAGNOSIS — R9431 Abnormal electrocardiogram [ECG] [EKG]: Secondary | ICD-10-CM | POA: Diagnosis not present

## 2019-01-04 DIAGNOSIS — M109 Gout, unspecified: Secondary | ICD-10-CM | POA: Diagnosis not present

## 2019-01-04 DIAGNOSIS — M4800 Spinal stenosis, site unspecified: Secondary | ICD-10-CM | POA: Diagnosis not present

## 2019-01-04 DIAGNOSIS — M792 Neuralgia and neuritis, unspecified: Secondary | ICD-10-CM | POA: Diagnosis not present

## 2019-01-04 DIAGNOSIS — Q898 Other specified congenital malformations: Secondary | ICD-10-CM | POA: Diagnosis not present

## 2019-01-04 DIAGNOSIS — M419 Scoliosis, unspecified: Secondary | ICD-10-CM | POA: Diagnosis not present

## 2019-01-04 DIAGNOSIS — Z79899 Other long term (current) drug therapy: Secondary | ICD-10-CM | POA: Diagnosis not present

## 2019-01-04 DIAGNOSIS — G894 Chronic pain syndrome: Secondary | ICD-10-CM | POA: Diagnosis not present

## 2019-01-04 DIAGNOSIS — M519 Unspecified thoracic, thoracolumbar and lumbosacral intervertebral disc disorder: Secondary | ICD-10-CM | POA: Diagnosis not present

## 2019-01-17 ENCOUNTER — Other Ambulatory Visit: Payer: Self-pay | Admitting: Family Medicine

## 2019-01-17 DIAGNOSIS — R251 Tremor, unspecified: Secondary | ICD-10-CM

## 2019-01-17 DIAGNOSIS — M542 Cervicalgia: Secondary | ICD-10-CM

## 2019-01-27 ENCOUNTER — Ambulatory Visit
Admission: RE | Admit: 2019-01-27 | Discharge: 2019-01-27 | Disposition: A | Discharge status: Home / Self Care | Payer: Medicare HMO | Source: Ambulatory Visit | Attending: Family Medicine | Admitting: Family Medicine

## 2019-01-27 DIAGNOSIS — R251 Tremor, unspecified: Secondary | ICD-10-CM

## 2019-01-27 DIAGNOSIS — M542 Cervicalgia: Secondary | ICD-10-CM

## 2019-02-05 ENCOUNTER — Telehealth: Payer: Self-pay | Admitting: Cardiovascular Disease

## 2019-02-05 NOTE — Telephone Encounter (Signed)
Pt c/o medication issue:  1. Name of Medication: carvedilol   2. How are you currently taking this medication (dosage and times per day)? 6.25 MG 1 tablet 2 times daily   3. Are you having a reaction (difficulty breathing--STAT)? Chest discomfort, high BP  4. What is your medication issue? Patient calling, has been experiencing chest discomfort and elevated BP.  Patient would like to know if a change in medication may help.  Please call to discuss

## 2019-02-05 NOTE — Telephone Encounter (Signed)
Returned the pt call. Pt sts that a couple of days while loading wood into his wood burning stove he developed an episode of chest pressure/tightness. The episode lasted a couple of minutes and resolved with rest. He denies any associated symptoms. Sob , n/v, palpitations, pre-syncope or syncope.  He has not had any reoccurrence of chest pressure. He does check his BP regularly and notes that his BP has been elevated the last 2-3 days. His BP yesterday 181/100 his is unable to provide his HR but sts that it was fine. His BP this morning 176/93 (after medication) I asked him to recheck his BP while I held on the phone, he reported 184/101 76bpm.  He is currently asymptomatic. He did resume Asa 81mg  as advised by Dr.Gollan. He is taking his Carvedilol 6.25mg  bid as prescribed.  Adv pt that I will fwd an update to [place] and we will give him a call back with his recommendation. Pt is to seek emergency care if there is a reoccurrence of chest pressure that is prolonged and doesn't resolved with rest. Or if other cardiac syptoms develop.  Pt is agreeable with the plan and verbalized understanding.

## 2019-02-06 NOTE — Telephone Encounter (Signed)
He is welcome to come in and see one of the APPS,  Chest pain from lifting wood, muscle/ligament strain?  Need more bp and heart rate numbers Any changes? Stress, illness. On last visit was 600 systolic

## 2019-02-07 NOTE — Telephone Encounter (Signed)
Returned the call to the patient.  He stated that he did have some stress last week concerning his dog who is having heart problems. The dog had to have a pacemaker placed and this cost the paitent a lot of money.  This morning his blood pressure was 156/96 and heart rate was 75. He has been advised to take his blood pressure daily and keep a log of this. He will call back with the recordings (on Friday or Monday)  He stated that he has not had any pain since the original call. He will call back if he becomes symptomatic or his blood pressures elevates again. If he does experience the pain again, he will go to the ED for further assessment if the pain is not relieved with rest.

## 2019-02-20 DIAGNOSIS — K5909 Other constipation: Secondary | ICD-10-CM | POA: Diagnosis not present

## 2019-04-02 ENCOUNTER — Other Ambulatory Visit: Payer: Self-pay | Admitting: Cardiovascular Disease

## 2019-08-13 ENCOUNTER — Other Ambulatory Visit: Payer: Self-pay | Admitting: Cardiovascular Disease

## 2019-08-14 ENCOUNTER — Encounter: Payer: Self-pay | Admitting: Cardiovascular Disease

## 2019-10-11 ENCOUNTER — Telehealth: Payer: Self-pay | Admitting: Cardiovascular Disease

## 2019-10-11 NOTE — Telephone Encounter (Signed)
Spoke with patient and he reports that he has been having some tightness in his chest and twinges in left arm. He reports that this pain is more noticeable in the evening when he is taking his dog for his walk. Patient has not been checking his blood pressures but last week it was roughly 130/76. He states that it does ease after rest. Scheduled him to come in and see Dr. Rockey Situ next week and reviewed signs and symptoms to monitor for that would require immediate evaluation in the ED. He verbalized understanding of our conversation and had no further questions at this time. Confirmed appointment for next week with instructions to call back if symptoms persist or worsen.

## 2019-10-11 NOTE — Telephone Encounter (Signed)
Pt c/o of Chest Pain: STAT if CP now or developed within 24 hours  1. Are you having CP right now?  No   2. Are you experiencing any other symptoms (ex. SOB, nausea, vomiting, sweating)? Tightness in chest twindging pain   3. How long have you been experiencing CP? 2-3 months   4. Is your CP continuous or coming and going? Comes and goes  Getting more noticeable in the evening when walking dog   5. Have you taken Nitroglycerin? No but takes tramadol gabapentin and Ibuprofen prn  ?

## 2019-10-15 NOTE — Progress Notes (Signed)
Cardiology Office Note  Date:  10/17/2019   ID:  Lorry, Izquierdo 1951/02/12, MRN [id number]  PCP:  Remi Haggard, FNP   Chief Complaint  Patient presents with  . other    Pt. c/o chest pressure just like when he had his first stent placed. Meds reviewed by the pt. verbally.     HPI:  Mr. 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 Lucky Cowboy,  atrial fibrillation dating back to 2011  severe back pain not a surgical candidate per the surgeons given his severe bilateral carotid disease Old CVA on CT scan head in 2015 who presents for follow-up of his atrial fibrillation  Having a little pressure in chest,  Comes on with exertion, not on a regular basis No sob  No regular exercise program Weight higher Poor balance  Lab work reviewed with him Total chol 120 LDL 54  Prior echocardiogram and carotid ultrasound done at outside facilities 2017, reviewed with him  Chronic pain, takes tramadol  Tolerating crestor 40 mg daily with Zetia He had myalgias on Lipitor   No significant leg edema, not on Lasix  EKG personally reviewed by myself on todays visit  shows atrial fibrillation with ventricular rate 68 bpm, PVC, nonspecific ST abnormality anterolateral leads V3 through V6, 1 and aVL No change from 11/2018  Other past medical history cardiac catheterization 01/20/2009 with 95% proximal circumflex, 60% mid LAD. PCI of the left circumflex vessel  In the past he has tried higher dose pravastatin 80 mg and had side effects including fatigue, weakness, muscle ache. He is able to tolerate 40 mg daily.  In terms of his other medical issues, he has a "flatback" and per his report has spinal stenosis, scoliosis and chronic pain. He is scheduled to see [facility] back clinic. He reports tramadol helps to some degree but still has significant  breakthrough pain. He is very limited in his ability to exert himself or ambulate secondary to his back.  Carotid ultrasound reviewed with him from 01/14/2015 showing bilateral internal carotid occlusion, no significant change from ultrasound dated June 2015  Prior studies show cardioversion 02/25/2015 Transesophageal echo 08/11/2010 with PFO noted, ejection fraction 50%, left atrium moderately dilated, moderate MR, mild to moderate TR   PMH:   has a past medical history of Atrial flutter, paroxysmal ([place]), Carotid artery disease without cerebral infarction [facility]), Coronary artery disease (2011), Flatback syndrome of thoracolumbar region, H/O calcium pyrophosphate deposition disease (CPPD), History of TIA (transient ischemic attack), Hyperlipidemia, Paroxysmal A-fib ([place]), and Spinal stenosis of lumbar region without neurogenic claudication.  PSH:    Past Surgical History:  Procedure Laterality Date  . BILATERAL CARPAL TUNNEL RELEASE    . CARDIAC CATHETERIZATION    . CIRCUMCISION, NON-NEWBORN    . CORONARY ANGIOPLASTY WITH STENT PLACEMENT  2011    Current Outpatient Medications  Medication Sig Dispense Refill  . aspirin EC 81 MG tablet Take 1 tablet (81 mg total) by mouth daily. 90 tablet 3  . calcium gluconate 500 MG tablet Take 1 tablet by mouth daily.    . carvedilol (COREG) 6.25 MG tablet Take 1 tablet (6.25 mg total) by mouth 2 (two) times daily. 180 tablet 0  . Coenzyme Q10 (CO Q 10) 100 MG CAPS Take by mouth daily.    . colchicine 0.6 MG tablet Take 0.6 mg by mouth 2 (  two) times daily as needed.    . digoxin (LANOXIN) 0.25 MG tablet TAKE ONE TABLET EVERY DAY 30 tablet 3  . DOCOSAHEXAENOIC ACID PO Take by mouth.    [name] 5 MG TABS tablet TAKE ONE TABLET TWICE DAILY 60 tablet 1  . ezetimibe (ZETIA) 10 MG tablet TAKE ONE (1) TABLET EACH DAY 30 tablet 3  . furosemide (LASIX) 20 MG tablet TAKE TWO TABLET A DAY AS NEEDED (Patient taking differently: 20 mg. TAKE TWO TABLET A DAY  AS NEEDED) 180 tablet 3  . gabapentin (NEURONTIN) 800 MG tablet Take 800 mg by mouth 3 (three) times daily.    . Magnesium Gluconate 250 MG TABS Take 250 mg by mouth daily.    . Melatonin 5 MG TABS Take 5 mg by mouth daily.    . Melatonin-Pyridoxine (MELATONIN TR WITH VITAMIN B6) 3-10 MG TBCR Take by mouth.    . metFORMIN (GLUCOPHAGE) 500 MG tablet Take 500 mg by mouth 2 (two) times daily.    . nitroGLYCERIN (NITROSTAT) 0.4 MG SL tablet Place 1 tablet (0.4 mg total) under the tongue every 5 (five) minutes as needed for chest pain. 25 tablet 3  . Omega-3 1000 MG CAPS Take by mouth.    . potassium chloride (K-DUR) 10 MEQ tablet Take 1 tablet (10 mEq total) by mouth daily as needed. 90 tablet 6  . rosuvastatin (CRESTOR) 40 MG tablet Take 1 tablet (40 mg total) by mouth daily. 30 tablet 11  . traMADol (ULTRAM) 50 MG tablet Take 100 mg by mouth every 6 (six) hours as needed.     No current facility-administered medications for this visit.      Allergies:   Propafenone, Codeine, and Penicillins   Social History:  The patient  reports that he has quit smoking. His smoking use included cigarettes. He has a 10.00 pack-year smoking history. He has never used smokeless tobacco. He reports current alcohol use. He reports that he does not use drugs.   Family History:   family history includes Heart disease (age of onset: 53) in his mother; Hypertension in his father.    Review of Systems: Review of Systems  Constitutional: Negative.   HENT: Negative.   Respiratory: Negative.   Cardiovascular: Positive for chest pain.  Gastrointestinal: Negative.   Musculoskeletal: Positive for back pain.  Neurological: Negative.   All other systems reviewed and are negative.    PHYSICAL EXAM: VS:  BP 134/70 (BP Location: Left Arm, Patient Position: Sitting, Cuff Size: Normal)   Pulse 68   Ht 5\' 10"  (1.778 m)   Wt 239 lb (108.4 kg)   BMI 34.29 kg/m  , BMI Body mass index is 34.29 kg/m. Constitutional:   oriented to person, place, and time. No distress.  HENT:  Head: Grossly normal Eyes:  no discharge. No scleral icterus.  Neck: No JVD, no carotid bruits  Cardiovascular: Regular rate and rhythm, no murmurs appreciated Pulmonary/Chest: Clear to auscultation bilaterally, no wheezes or rails Abdominal: Soft.  no distension.  no tenderness.  Musculoskeletal: Normal range of motion Neurological:  normal muscle tone. Coordination normal. No atrophy Skin: Skin warm and dry Psychiatric: normal affect, pleasant   Recent Labs: No results found for requested labs within last 8760 hours.    Lipid Panel Lab Results  Component Value Date   CHOL 147 04/02/2015   HDL 38 (L) 04/02/2015   LDLCALC 95 04/02/2015   TRIG 70 04/02/2015      Wt Readings from Last 3  Encounters:  10/17/19 239 lb (108.4 kg)  12/05/18 243 lb (110.2 kg)  09/22/17 250 lb (113.4 kg)     ASSESSMENT AND PLAN:  Acute diastolic CHF (congestive heart failure) (HCC) - Euvolemic on today's visit Lungs clear, no leg edema, No changes to his medications Recommend importance of weight loss  Bilateral carotid artery stenosis - Plan: EKG 12-Lead Occluded carotids bilaterally Previously seen by Dr. Lucky Cowboy, surgery was not offered Stressed importance of aggressive lipid control We will request labs from primary care  Coronary artery disease involving native coronary artery of native heart without angina pectoris - Long discussion concerning his chest discomfort He does not want work-up at this time prefers to treat it medically We have suggested he add Imdur 30 mg daily with close monitoring of his blood pressure If symptoms do not improve we would recommend stress testing with pharmacologic Myoview.  The above discussed with him, he will call us if symptoms do not resolve  Chronic atrial fibrillation ([place]) - Plan: EKG 12-Lead On anticoagulation, rate well controlled Tolerating Eliquis.  No recent falls  Mixed  hyperlipidemia - Plan: EKG 12-Lead  Crestor 40 mg daily with Zetia Labs have been requested  Morbid obesity Recommended a regular exercise program for weight loss Weight has been trending upward   Total encounter time more than 25 minutes  Greater than 50% was spent in counseling and coordination of care with the patient   Disposition:   F/U  6 months   No orders of the defined types were placed in this encounter.    Signed, Esmond Plants, M.D., Ph.D. 10/17/2019  [place], [place]

## 2019-10-17 ENCOUNTER — Ambulatory Visit (INDEPENDENT_AMBULATORY_CARE_PROVIDER_SITE_OTHER): Payer: Medicare HMO | Admitting: Cardiovascular Disease

## 2019-10-17 ENCOUNTER — Encounter: Payer: Self-pay | Admitting: Cardiovascular Disease

## 2019-10-17 ENCOUNTER — Other Ambulatory Visit: Payer: Self-pay

## 2019-10-17 VITALS — BP 134/70 | HR 68 | Ht 70.0 in | Wt 239.0 lb

## 2019-10-17 DIAGNOSIS — I739 Peripheral vascular disease, unspecified: Secondary | ICD-10-CM | POA: Diagnosis not present

## 2019-10-17 DIAGNOSIS — I5032 Chronic diastolic (congestive) heart failure: Secondary | ICD-10-CM

## 2019-10-17 DIAGNOSIS — I25118 Atherosclerotic heart disease of native coronary artery with other forms of angina pectoris: Secondary | ICD-10-CM

## 2019-10-17 DIAGNOSIS — I482 Chronic atrial fibrillation, unspecified: Secondary | ICD-10-CM

## 2019-10-17 DIAGNOSIS — I6523 Occlusion and stenosis of bilateral carotid arteries: Secondary | ICD-10-CM

## 2019-10-17 DIAGNOSIS — E782 Mixed hyperlipidemia: Secondary | ICD-10-CM

## 2019-10-17 MED ORDER — ISOSORBIDE MONONITRATE ER 30 MG PO TB24: 30.0000 mg | ORAL_TABLET | Freq: Every day | ORAL | 3 refills | Status: DC

## 2019-10-17 NOTE — Patient Instructions (Addendum)
Monitor heart rate with exertion  Labs from PMD for our system  If chest  Symptoms get worse, call the office We would order a stress test  Medication Instructions:  Your physician has recommended you make the following change in your medication:  1. START Isosorbide mononitrate 30 mg once daily  If you need a refill on your cardiac medications before your next appointment, please call your pharmacy.   Lab work: Digoxin level today  If you have labs (blood work) drawn today and your tests are completely normal, you will receive your results only by: Marland Kitchen MyChart Message (if you have MyChart) OR . A paper copy in the mail If you have any lab test that is abnormal or we need to change your treatment, we will call you to review the results.   Testing/Procedures: No new testing needed   Follow-Up: At [facility], you and your health needs are our priority.  As part of our continuing mission to provide you with exceptional heart care, we have created designated Provider Care Teams.  These Care Teams include your primary Cardiologist (physician) and Advanced Practice Providers (APPs -  Physician Assistants and Nurse Practitioners) who all work together to provide you with the care you need, when you need it.  . You will need a follow up appointment in 6 months  . Providers on your designated Care Team:   . Murray Hodgkins, NP . Christell Faith, PA-C . Marrianne Mood, PA-C  Any Other Special Instructions Will Be Listed Below (If Applicable).  For educational health videos Log in to : [email or web address] Or : [email or web address], password : [email or web address]

## 2019-10-18 LAB — DIGOXIN LEVEL: Digoxin, Serum: 1.4 ng/mL — ABNORMAL HIGH (ref 0.5–0.9)

## 2019-10-22 ENCOUNTER — Telehealth: Payer: Self-pay | Admitting: *Deleted

## 2019-10-22 MED ORDER — CARVEDILOL 12.5 MG PO TABS: 12.5000 mg | ORAL_TABLET | Freq: Two times a day (BID) | ORAL | 3 refills | Status: DC

## 2019-10-22 NOTE — Telephone Encounter (Signed)
-----   Message from Minna Merritts, MD sent at 10/21/2019  3:22 PM EST ----- Digoxin level elevated Would recommend we stop the digoxin,  Increase carvedilol to keep heart rate under control, New dose 12.5 mg twice daily Recommend he monitor blood pressure at home and heart rate

## 2019-10-22 NOTE — Telephone Encounter (Signed)
Reviewed results and recommendations with patient and he verbalized understanding. Sent in updated prescription for carvedilol and he verbalized understanding to hold digoxin. He was appreciative for the call with no further questions at this time.

## 2019-12-03 ENCOUNTER — Telehealth: Payer: Self-pay | Admitting: Cardiovascular Disease

## 2019-12-03 NOTE — Telephone Encounter (Signed)
Patient would like advice on whether or not to get on COVID vaccine list  Patient was told to check with primary care but says his PCP is not doing anything with the COVID vaccine so he wants to check with [place] to see if there is anywhere we can refer  Please call to discuss

## 2019-12-03 NOTE — Telephone Encounter (Signed)
Call to patient to make him aware that Dr. Rockey Situ is recommending the Covid vaccine for his patients. We reported that he would call the health dept to see when he will be able to take it.   Advised pt to call for any further questions or concerns.

## 2020-03-31 ENCOUNTER — Other Ambulatory Visit: Payer: Self-pay

## 2020-03-31 MED ORDER — FUROSEMIDE 20 MG PO TABS: ORAL_TABLET | ORAL | 0 refills | Status: DC

## 2020-03-31 NOTE — Telephone Encounter (Signed)
*  STAT* If patient is at the pharmacy, call can be transferred to refill team.   1. Which medications need to be refilled? (please list name of each medication and dose if known) Lasix  2. Which pharmacy/location (including street and city if local pharmacy) is medication to be sent to? [facility]  3. Do they need a 30 day or 90 day supply? [place]

## 2020-04-15 ENCOUNTER — Other Ambulatory Visit: Payer: Self-pay

## 2020-04-15 ENCOUNTER — Encounter: Payer: Self-pay | Admitting: Cardiovascular Disease

## 2020-04-15 ENCOUNTER — Ambulatory Visit (INDEPENDENT_AMBULATORY_CARE_PROVIDER_SITE_OTHER): Payer: Medicare HMO | Admitting: Cardiovascular Disease

## 2020-04-15 VITALS — BP 126/70 | HR 83 | Ht 70.0 in | Wt 250.0 lb

## 2020-04-15 DIAGNOSIS — I482 Chronic atrial fibrillation, unspecified: Secondary | ICD-10-CM | POA: Diagnosis not present

## 2020-04-15 DIAGNOSIS — I25118 Atherosclerotic heart disease of native coronary artery with other forms of angina pectoris: Secondary | ICD-10-CM

## 2020-04-15 DIAGNOSIS — I5032 Chronic diastolic (congestive) heart failure: Secondary | ICD-10-CM

## 2020-04-15 DIAGNOSIS — I739 Peripheral vascular disease, unspecified: Secondary | ICD-10-CM

## 2020-04-15 DIAGNOSIS — I6523 Occlusion and stenosis of bilateral carotid arteries: Secondary | ICD-10-CM

## 2020-04-15 MED ORDER — ISOSORBIDE MONONITRATE ER 30 MG PO TB24: 30.0000 mg | ORAL_TABLET | Freq: Two times a day (BID) | ORAL | 3 refills | Status: DC

## 2020-04-15 NOTE — Patient Instructions (Addendum)
Medication Instructions:  Ok to take imdur twice a day  If you need a refill on your cardiac medications before your next appointment, please call your pharmacy.    Lab work: [employer] today   If you have labs (blood work) drawn today and your tests are completely normal, you will receive your results only by: Marland Kitchen MyChart Message (if you have MyChart) OR . A paper copy in the mail If you have any lab test that is abnormal or we need to change your treatment, we will call you to review the results.   Testing/Procedures: No new testing needed   Follow-Up: At [facility], you and your health needs are our priority.  As part of our continuing mission to provide you with exceptional heart care, we have created designated Provider Care Teams.  These Care Teams include your primary Cardiologist (physician) and Advanced Practice Providers (APPs -  Physician Assistants and Nurse Practitioners) who all work together to provide you with the care you need, when you need it.  . You will need a follow up appointment in 12 months   . Providers on your designated Care Team:   . Murray Hodgkins, NP . Christell Faith, PA-C . Marrianne Mood, PA-C  Any Other Special Instructions Will Be Listed Below (If Applicable).  For educational health videos Log in to : [email or web address] Or : [email or web address], password : [email or web address]

## 2020-04-15 NOTE — Progress Notes (Signed)
Cardiology Office Note  Date:  04/15/2020   ID:  Jeremy Reese, DOB 1951-04-26, MRN [id number]  PCP:  Remi Haggard, FNP   Chief Complaint  Patient presents with  . OTher    6 month follow up. Patient c/o having symptoms like before his last stent was placed. meds reviewed verbally with patient.     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 Lucky Cowboy,  atrial fibrillation dating back to 2011  severe back pain not a surgical candidate per the surgeons given his severe bilateral carotid disease Old CVA on CT scan head in 2015 who presents for follow-up of his atrial fibrillation,, PAD  On lasix daily, put himself back on Not on potassium No edema  No walking, sedentary Wife walks Knee and back pain, has a scooter  Having a little pressure in chest,  Comes on with exertion, not on a regular basis No sob, "good"  Lab work reviewed with him Total chol 94  Chronic pain, takes tramadol  Tolerating crestor 40 mg daily with Zetia  EKG personally reviewed by myself on todays visit  shows atrial fibrillation with ventricular rate 83 bpm,   Other past medical history cardiac catheterization 01/20/2009 with 95% proximal circumflex, 60% mid LAD. PCI of the left circumflex vessel  In the past he has tried higher dose pravastatin 80 mg and had side effects including fatigue, weakness, muscle ache. He is able to tolerate 40 mg daily.  In terms of his other medical issues, he has a "flatback" and per his report has spinal stenosis, scoliosis and chronic pain. He is scheduled to see [facility] back clinic. He reports tramadol helps to some degree but still has significant breakthrough pain. He is very limited in his ability to exert himself or ambulate secondary to his back.  Carotid ultrasound reviewed with him from 01/14/2015 showing  bilateral internal carotid occlusion, no significant change from ultrasound dated June 2015  Prior studies show cardioversion 02/25/2015 Transesophageal echo 08/11/2010 with PFO noted, ejection fraction 50%, left atrium moderately dilated, moderate MR, mild to moderate TR   PMH:   has a past medical history of Atrial flutter, paroxysmal ([place]), Carotid artery disease without cerebral infarction [facility]), Coronary artery disease (2011), Flatback syndrome of thoracolumbar region, H/O calcium pyrophosphate deposition disease (CPPD), History of TIA (transient ischemic attack), Hyperlipidemia, Paroxysmal A-fib ([place]), and Spinal stenosis of lumbar region without neurogenic claudication.  PSH:    Past Surgical History:  Procedure Laterality Date  . BILATERAL CARPAL TUNNEL RELEASE    . CARDIAC CATHETERIZATION    . CIRCUMCISION, NON-NEWBORN    . CORONARY ANGIOPLASTY WITH STENT PLACEMENT  2011    Current Outpatient Medications  Medication Sig Dispense Refill  . aspirin EC 81 MG tablet Take 1 tablet (81 mg total) by mouth daily. 90 tablet 3  . calcium gluconate 500 MG tablet Take 1 tablet by mouth daily.    . carvedilol (COREG) 12.5 MG tablet Take 1 tablet (12.5 mg total) by mouth 2 (two) times daily. 180 tablet 3  . Coenzyme Q10 (CO Q 10) 100 MG CAPS Take by mouth daily.    . colchicine 0.6 MG tablet Take 0.6 mg by mouth 2 (two) times daily as needed.    . DOCOSAHEXAENOIC ACID PO Take by mouth.    [name] 5 MG TABS tablet TAKE  ONE TABLET TWICE DAILY 60 tablet 1  . ezetimibe (ZETIA) 10 MG tablet TAKE ONE (1) TABLET EACH DAY 30 tablet 3  . furosemide (LASIX) 20 MG tablet TAKE TWO TABLET A DAY AS NEEDED 180 tablet 0  . gabapentin (NEURONTIN) 800 MG tablet Take 800 mg by mouth 3 (three) times daily.    . Magnesium Gluconate 250 MG TABS Take 250 mg by mouth daily.    . Melatonin 5 MG TABS Take 5 mg by mouth daily.    . Melatonin-Pyridoxine (MELATONIN TR WITH VITAMIN B6) 3-10 MG TBCR Take by mouth.     . metFORMIN (GLUCOPHAGE) 500 MG tablet Take 500 mg by mouth 2 (two) times daily.    . nitroGLYCERIN (NITROSTAT) 0.4 MG SL tablet Place 1 tablet (0.4 mg total) under the tongue every 5 (five) minutes as needed for chest pain. 25 tablet 3  . Omega-3 1000 MG CAPS Take by mouth.    . potassium chloride (K-DUR) 10 MEQ tablet Take 1 tablet (10 mEq total) by mouth daily as needed. 90 tablet 6  . rosuvastatin (CRESTOR) 40 MG tablet Take 1 tablet (40 mg total) by mouth daily. 30 tablet 11  . traMADol (ULTRAM) 50 MG tablet Take 100 mg by mouth every 6 (six) hours as needed.    . isosorbide mononitrate (IMDUR) 30 MG 24 hr tablet Take 1 tablet (30 mg total) by mouth daily. 90 tablet 3   No current facility-administered medications for this visit.     Allergies:   Propafenone, Codeine, and Penicillins   Social History:  The patient  reports that he has quit smoking. His smoking use included cigarettes. He has a 10.00 pack-year smoking history. He has never used smokeless tobacco. He reports current alcohol use. He reports that he does not use drugs.   Family History:   family history includes Heart disease (age of onset: 71) in his mother; Hypertension in his father.    Review of Systems: Review of Systems  Constitutional: Negative.   HENT: Negative.   Respiratory: Negative.   Cardiovascular: Positive for chest pain.  Gastrointestinal: Negative.   Musculoskeletal: Positive for back pain.  Neurological: Negative.   All other systems reviewed and are negative.    PHYSICAL EXAM: VS:  BP 126/70 (BP Location: Left Arm, Patient Position: Sitting, Cuff Size: Normal)   Pulse 83   Ht 5\' 10"  (1.778 m)   Wt 250 lb (113.4 kg)   SpO2 96%   BMI 35.87 kg/m  , BMI Body mass index is 35.87 kg/m. Constitutional:  oriented to person, place, and time. No distress.  HENT:  Head: Grossly normal Eyes:  no discharge. No scleral icterus.  Neck: No JVD, no carotid bruits  Cardiovascular: Regular rate and  rhythm, no murmurs appreciated Pulmonary/Chest: Clear to auscultation bilaterally, no wheezes or rails Abdominal: Soft.  no distension.  no tenderness.  Musculoskeletal: Normal range of motion Neurological:  normal muscle tone. Coordination normal. No atrophy Skin: Skin warm and dry Psychiatric: normal affect, pleasant   Recent Labs: No results found for requested labs within last 8760 hours.    Lipid Panel Lab Results  Component Value Date   CHOL 147 04/02/2015   HDL 38 (L) 04/02/2015   LDLCALC 95 04/02/2015   TRIG 70 04/02/2015      Wt Readings from Last 3 Encounters:  04/15/20 250 lb (113.4 kg)  10/17/19 239 lb (108.4 kg)  12/05/18 243 lb (110.2 kg)     ASSESSMENT AND PLAN:  Acute diastolic CHF (congestive heart failure) (HCC) - Put himself back on lasix, not on potassium BMP today  Bilateral carotid artery stenosis - Plan: EKG 12-Lead Occluded carotids bilaterally Previously seen by Dr. Lucky Cowboy, surgery was not offered Lipids at goal  Coronary artery disease involving native coronary artery of native heart without angina pectoris - .chronic stable angina Wants to increase imdur Declining stress test, no cath  Chronic atrial fibrillation ([place]) - Plan: EKG 12-Lead On anticoagulation, rate well controlled Tolerating Eliquis.   stable  Mixed hyperlipidemia - Plan: EKG 12-Lead  Crestor 40 mg daily with Zetia Lipids at goal  Morbid obesity We have encouraged continued exercise, careful diet management in an effort to lose weight.    Total encounter time more than 25 minutes  Greater than 50% was spent in counseling and coordination of care with the patient   Disposition:   F/U  6 months   Orders Placed This Encounter  Procedures  . EKG 12-Lead     Signed, Esmond Plants, M.D., Ph.D. 04/15/2020  [place], [place]

## 2020-04-16 LAB — BASIC METABOLIC PANEL
BUN/Creatinine Ratio: 16 (ref 10–24)
BUN: 15 mg/dL (ref 8–27)
CO2: 25 mmol/L (ref 20–29)
Calcium: 9.7 mg/dL (ref 8.6–10.2)
Chloride: 100 mmol/L (ref 96–106)
Creatinine, Ser: 0.95 mg/dL (ref 0.76–1.27)
GFR calc Af Amer: 95 mL/min/{1.73_m2} (ref >59)
GFR calc non Af Amer: 82 mL/min/{1.73_m2} (ref >59)
Glucose: 82 mg/dL (ref 65–99)
Potassium: 5.1 mmol/L (ref 3.5–5.2)
Sodium: 140 mmol/L (ref 134–144)

## 2020-09-01 ENCOUNTER — Other Ambulatory Visit: Payer: Self-pay | Admitting: Cardiovascular Disease

## 2020-09-01 NOTE — Telephone Encounter (Signed)
Rx request sent to pharmacy.  

## 2020-09-02 ENCOUNTER — Telehealth: Payer: Self-pay | Admitting: Cardiovascular Disease

## 2020-09-02 NOTE — Telephone Encounter (Signed)
Spoke with patient and he states that these symptoms were similar to when he previously had his stent placed. He doesn't have any shortness of breath much. Inquired if he has been lifting anything daily. He states that he has been cutting firewood with chain saw. He also uses a saw cutter as well. He did have stent put in about 10 years ago based on his report. Offered appointment with provider later this week and he was agreeable with that plan. I did review signs and symptoms which would require him to go to ED and he verbalized understanding with no further questions at this time.

## 2020-09-02 NOTE — Telephone Encounter (Signed)
Pt c/o of Chest Pain: STAT if CP now or developed within 24 hours  1. Are you having CP right now? No  2. Are you experiencing any other symptoms (ex. SOB, nausea, vomiting, sweating)? Yesterday morning, nausea, left arm in biceps area was painful  3. How long have you been experiencing CP? 1 week  4. Is your CP continuous or coming and going? Comes and goes  5. Have you taken Nitroglycerin? No, states he did increase his Isosorbide?

## 2020-09-05 ENCOUNTER — Other Ambulatory Visit: Payer: Self-pay

## 2020-09-05 ENCOUNTER — Ambulatory Visit (INDEPENDENT_AMBULATORY_CARE_PROVIDER_SITE_OTHER): Payer: Medicare HMO | Admitting: Family

## 2020-09-05 ENCOUNTER — Encounter: Payer: Self-pay | Admitting: Family

## 2020-09-05 VITALS — BP 130/86 | HR 82 | Ht 71.0 in | Wt 256.0 lb

## 2020-09-05 DIAGNOSIS — I5032 Chronic diastolic (congestive) heart failure: Secondary | ICD-10-CM | POA: Diagnosis not present

## 2020-09-05 DIAGNOSIS — E785 Hyperlipidemia, unspecified: Secondary | ICD-10-CM | POA: Diagnosis not present

## 2020-09-05 DIAGNOSIS — I25110 Atherosclerotic heart disease of native coronary artery with unstable angina pectoris: Secondary | ICD-10-CM | POA: Diagnosis not present

## 2020-09-05 DIAGNOSIS — Z6835 Body mass index (BMI) 35.0-35.9, adult: Secondary | ICD-10-CM

## 2020-09-05 DIAGNOSIS — I482 Chronic atrial fibrillation, unspecified: Secondary | ICD-10-CM

## 2020-09-05 DIAGNOSIS — Z7901 Long term (current) use of anticoagulants: Secondary | ICD-10-CM

## 2020-09-05 DIAGNOSIS — I6523 Occlusion and stenosis of bilateral carotid arteries: Secondary | ICD-10-CM

## 2020-09-05 MED ORDER — ISOSORBIDE MONONITRATE ER 60 MG PO TB24: 60.0000 mg | ORAL_TABLET | Freq: Two times a day (BID) | ORAL | 1 refills | Status: DC

## 2020-09-05 MED ORDER — NITROGLYCERIN 0.4 MG SL SUBL
0.4000 mg | SUBLINGUAL_TABLET | SUBLINGUAL | 3 refills | Status: AC | PRN
Start: 2020-09-05 — End: ?

## 2020-09-05 MED ORDER — ROSUVASTATIN CALCIUM 40 MG PO TABS: 40.0000 mg | ORAL_TABLET | Freq: Every day | ORAL | 3 refills | Status: DC

## 2020-09-05 NOTE — Patient Instructions (Addendum)
Medication Instructions:  Your physician has recommended you make the following change in your medication:   CHANGE Isosorbide Mononitrate (Imdur) to 1 tablet (60mg ) twice daily.  *If you need a refill on your cardiac medications before your next appointment, please call your pharmacy*   Lab Work: Your physician recommends that you return for lab work today: BMP, CBC, lipid panel  If you have labs (blood work) drawn today and your tests are completely normal, you will receive your results only by: Marland Kitchen MyChart Message (if you have MyChart) OR . A paper copy in the mail If you have any lab test that is abnormal or we need to change your treatment, we will call you to review the results.  Testing/Procedures: Your physician has requested that you have a cardiac catheterization. Cardiac catheterization is used to diagnose and/or treat various heart conditions.   [facility] Cardiac Cath Instructions   You are scheduled for a Cardiac Cath on:__10/15/2021__________  Please arrive at __7:30AM_____am on the day of your procedure  Please expect a call from our [place] to pre-register you  Do not eat/drink anything after midnight  Someone will need to drive you home  It is recommended someone be with you for the first 24 hours after your procedure  Wear clothes that are easy to get on/off and wear slip on shoes if possible   Medications bring a current list of all medications with you  1. HOLD Eliquis 2 days prior to your procedure 2. HOLD Lasix the morning of the procedure   Day of your procedure: Arrive at the [place] entrance.  Free valet service is available.  After entering the [place] please check-in at the registration desk (1st desk on your right) to receive your armband. After receiving your armband someone will escort you to the cardiac cath/special procedures waiting area.  The usual length of stay after your procedure is about 2 to 3 hours.  This  can vary.  If you have any questions, please call our office at [phone], or you may call the cardiac cath lab at [facility] directly at [phone]  CV19 Pre admit testing DRIVE THRU  Please report to the PAT testing site (medical arts building) on ___10/13/2021______ date ______8:00 am - 1:00PM_____ time for your DRIVE THRU covid testing that is required prior to your procedure.  Following covid testing, please remain in quarantine. If you must be around others, please wash hands, avoid touching face and wear your mask.     Follow-Up: At [facility], you and your health needs are our priority.  As part of our continuing mission to provide you with exceptional heart care, we have created designated Provider Care Teams.  These Care Teams include your primary Cardiologist (physician) and Advanced Practice Providers (APPs -  Physician Assistants and Nurse Practitioners) who all work together to provide you with the care you need, when you need it.  We recommend signing up for the patient portal called "MyChart".  Sign up information is provided on this After Visit Summary.  MyChart is used to connect with patients for Virtual Visits (Telemedicine).  Patients are able to view lab/test results, encounter notes, upcoming appointments, etc.  Non-urgent messages can be sent to your provider as well.   To learn more about what you can do with MyChart, go to [email or web address].    Your next appointment:   1-2 weeks after cardiac catheterization  The format for your next appointment:   In Person  Provider:  You may see Ida Rogue, MD or one of the following Advanced Practice Providers on your designated Care Team:    Murray Hodgkins, NP  Christell Faith, PA-C  Marrianne Mood, PA-C  Cadence Reydon, [place]  Laurann Montana, NP  Other Instructions

## 2020-09-05 NOTE — H&P (View-Only) (Signed)
Office Visit    Patient Name: Jeremy Reese Date of Encounter: 09/05/2020  Primary Care Provider:  Cipriano Mile, NP Primary Cardiologist:  Ida Rogue, MD Electrophysiologist:  None   Chief Complaint    Jeremy Reese is a 69 y.o. male with a hx of  CAD s/p PCI LCx 2010, TIA (2012), bilateral occlusion of ICA monitored by Dr. Lucky Cowboy, permanent atrial fibrillation, HLD, chronic back pain presents today for chest pain  Past Medical History    Past Medical History:  Diagnosis Date  . Atrial flutter, paroxysmal ([place])   . Carotid artery disease without cerebral infarction ([place])   . Coronary artery disease 2011  . Flatback syndrome of thoracolumbar region   . H/O calcium pyrophosphate deposition disease (CPPD)   . History of TIA (transient ischemic attack)   . Hyperlipidemia   . Paroxysmal A-fib ([place])   . Spinal stenosis of lumbar region without neurogenic claudication    Past Surgical History:  Procedure Laterality Date  . BILATERAL CARPAL TUNNEL RELEASE    . CARDIAC CATHETERIZATION    . CIRCUMCISION, NON-NEWBORN    . CORONARY ANGIOPLASTY WITH STENT PLACEMENT  2011   Allergies  Allergies  Allergen Reactions  . Propafenone Anxiety  . Codeine Nausea Only  . Penicillins Rash    History of Present Illness    Jeremy Reese is a 69 y.o. male with a hx of CAD s/p PCI LCx 2010, TIA, bilateral occlusion of ICA monitored by Dr. Lucky Cowboy, permanent atrial fibrillation, HLD, chronic back pain last seen 04/15/2020 by Dr. Rockey Situ.  Cardiac catheteterization in 2010 with PCI to left circumflex.  Per previous documentation cardiac cath at that time with 95% proximal circumflex, 60% mid LAD.  Per previous documentation transesophageal echo 08/11/2010 with PFO, EF 50%, LA moderately dilated, moderate MR, mild to moderate TR.  CT angio head neck 01/2014 with occlusion of both internal carotid arteries at origin with reconstitution intracranially via ECA collaterals and patent left  posterior communicating artery.  He had previous cardioversion 01/2015.  Notes chest pressure/tightness for about a month. Feels similar to previous anginal equivalent. Occurs at rest and with activity but worse with activity. Not associated with dyspnea nor diaphoresis. He has increased Imdur to 2 in the morning, 2 at lunchtime, and 2 at bedtime for the last three weeks. Tells me his pharmacist was concerned he was taking that much and has been taking 1 tablet three times per day for one week. Has not taken nitroglycerin. Tells me he notices more pain since reduced dose of Imdur.  Reports no shortness of breath nor dyspnea on exertion. No edema, orthopnea, PND. Reports no palpitations.   He questions whether he should go back on Tikosyn.  We discussed that Tikosyn would be for rhythm control.  As his atrial fibrillation is overall asymptomatic no indication for rhythm control at this time.  We did discuss that coronary disease can be contributory to some of his fatigue.  Long discussion regarding bilateral occlusion of his carotid arteries.  He wonders if the need to be fixed.  We discussed that they reconstituted the other vessels.  No amaurosis fugax.  No lightheadedness, dizziness, near-syncope, syncope.  EKGs/Labs/Other Studies Reviewed:   The following studies were reviewed today:  EKG:  EKG is ordered today.  The ekg ordered today demonstrates rate controlled atrial fibrillation 82 bpm.  No acute ST/T wave changes.  Recent Labs: 04/15/2020: BUN 15; Creatinine, Ser 0.95; Potassium 5.1; Sodium 140  Recent  Lipid Panel    Component Value Date/Time   CHOL 147 04/02/2015 0816   TRIG 70 04/02/2015 0816   HDL 38 (L) 04/02/2015 0816   CHOLHDL 3.9 04/02/2015 0816   LDLCALC 95 04/02/2015 0816    Home Medications   Current Meds  Medication Sig  . aspirin EC 81 MG tablet Take 1 tablet (81 mg total) by mouth daily. (Patient taking differently: Take 81 mg by mouth 3 (three) times a week. )  .  calcium gluconate 500 MG tablet Take 500 mg by mouth at bedtime.   . carvedilol (COREG) 12.5 MG tablet Take 1 tablet (12.5 mg total) by mouth 2 (two) times daily.  . colchicine 0.6 MG tablet Take 0.6 mg by mouth 2 (two) times daily as needed (gout).   . DOCOSAHEXAENOIC ACID PO Take by mouth.  [name] 5 MG TABS tablet TAKE ONE TABLET TWICE DAILY  . ezetimibe (ZETIA) 10 MG tablet TAKE ONE (1) TABLET EACH DAY  . furosemide (LASIX) 20 MG tablet TAKE TWO TABLET A DAY AS NEEDED  . gabapentin (NEURONTIN) 400 MG capsule Take 400 mg by mouth 3 (three) times daily.   . isosorbide mononitrate (IMDUR) 60 MG 24 hr tablet Take 1 tablet (60 mg total) by mouth in the morning and at bedtime.  . Magnesium Gluconate 250 MG TABS Take 250 mg by mouth daily.  . Melatonin 5 MG TABS Take 5 mg by mouth daily.  . Melatonin-Pyridoxine (MELATONIN TR WITH VITAMIN B6) 3-10 MG TBCR Take by mouth.  . nitroGLYCERIN (NITROSTAT) 0.4 MG SL tablet Place 1 tablet (0.4 mg total) under the tongue every 5 (five) minutes as needed for chest pain.  . potassium chloride (K-DUR) 10 MEQ tablet Take 1 tablet (10 mEq total) by mouth daily as needed.  . rosuvastatin (CRESTOR) 40 MG tablet Take 1 tablet (40 mg total) by mouth daily.  . traMADol (ULTRAM) 50 MG tablet Take 100 mg by mouth every 6 (six) hours as needed.  . [DISCONTINUED] isosorbide mononitrate (IMDUR) 30 MG 24 hr tablet Take 1 tablet (30 mg total) by mouth in the morning and at bedtime. (Patient taking differently: Take 2 tablets in the AM, 2 tablets at lunch and 2 tablets in the PM.)  . [DISCONTINUED] nitroGLYCERIN (NITROSTAT) 0.4 MG SL tablet Place 1 tablet (0.4 mg total) under the tongue every 5 (five) minutes as needed for chest pain.  . [DISCONTINUED] rosuvastatin (CRESTOR) 40 MG tablet Take 1 tablet (40 mg total) by mouth daily.    Review of Systems     Review of Systems  Constitutional: Positive for malaise/fatigue. Negative for chills and fever.  Cardiovascular:  Positive for chest pain and dyspnea on exertion. Negative for irregular heartbeat, leg swelling, near-syncope, orthopnea, palpitations and syncope.  Respiratory: Negative for cough, shortness of breath and wheezing.   Gastrointestinal: Negative for melena, nausea and vomiting.  Genitourinary: Negative for hematuria.  Neurological: Negative for dizziness, light-headedness and weakness.   All other systems reviewed and are otherwise negative except as noted above.  Physical Exam    VS:  BP 130/86 (BP Location: Left Arm, Patient Position: Sitting, Cuff Size: Normal)   Pulse 82   Ht 5\' 11"  (1.803 m)   Wt 256 lb (116.1 kg)   SpO2 96%   BMI 35.70 kg/m  , BMI Body mass index is 35.7 kg/m. GEN: Well nourished, overweight, well developed, in no acute distress. HEENT: normal. Neck: Supple, no JVD or masses. Cardiac: Irregularly irregular, no murmurs, rubs,  or gallops. No clubbing, cyanosis, edema.  Radials/DP/PT 2+ and equal bilaterally.  Respiratory:  Respirations regular and unlabored, clear to auscultation bilaterally. GI: Soft, nontender, nondistended, BS + x 4. MS: No deformity or atrophy. Skin: Warm and dry, no rash. Neuro:  Strength and sensation are intact. Psych: Normal affect.  Assessment & Plan    1. Diastolic heart failure -euvolemic and well compensated on exam today.  Discussed low-sodium, heart healthy diet.  Continue Lasix 40 mg as needed.  Continue Coreg 12.5 mg twice per day.  2. Bilateral carotid artery stenosis -CT 2015 with occlusion of left ICA at origin with reconstitution intracranially via ECA collaterals and patent left posterior communicating artery.  No near syncope, syncope, no amaurosis fugax.  Continue to follow with vascular surgery.  Encouraged to schedule follow-up with Dr. Lucky Cowboy.  3. CAD -Reports worsening symptoms of angina over the last month consistent with his anginal equivalent.  EKG today shows rate controlled atrial fibrillation with no acute ST/T wave  changes.  Plan for cardiac catheterization.  BMP, CBC today.  Previously on Imdur 30 mg twice daily.  He had self increased his Imdur to 60 mg 3 times daily but was cautioned by his pharmacist.  Change Imdur to 60 mg twice daily.  GDMT includes aspirin, beta-blocker, statin, Zetia, long-acting nitrate,PRN nitroglycerin. Refills provided today.   4. Chronic atrial fibrillation -rate controlled by EKG today and asymptomatic with no palpitations.  We discussed plan for rate control and he was agreeable.  Continue Coreg 12.5 mg twice daily.  Anticoagulated appropriately with Eliquis.  5. Chronic anticoagulation -secondary to PAF.  Denies bleeding complications.  Continue Eliquis 5 mg twice daily.  He will hold Eliquis 2 days prior to his planned cardiac catheterization.  6. HLD, LDL goal <70 -lipid panel today.  Continue rosuvastatin 40 mg daily, Zetia 10 mg daily.  7. Morbid obesity -weight loss by diet and exercise encouraged.  We discussed implications on his coronary artery disease.  Would recommend referral to cardiac rehab at follow-up.  Disposition: Labs today.  Cardiac catheterization next week due to angina.  Follow up 1-2 weeks after cardiac cath with Dr. Rockey Situ or APP   Loel Dubonnet, NP 09/05/2020, 4:34 PM

## 2020-09-05 NOTE — Progress Notes (Signed)
Office Visit    Patient Name: Jeremy Reese Date of Encounter: 09/05/2020  Primary Care Provider:  Cipriano Mile, NP Primary Cardiologist:  Ida Rogue, MD Electrophysiologist:  None   Chief Complaint    Jeremy Reese is a 69 y.o. male with a hx of  CAD s/p PCI LCx 2010, TIA (2012), bilateral occlusion of ICA monitored by Dr. Lucky Cowboy, permanent atrial fibrillation, HLD, chronic back pain presents today for chest pain  Past Medical History    Past Medical History:  Diagnosis Date  . Atrial flutter, paroxysmal ([place])   . Carotid artery disease without cerebral infarction ([place])   . Coronary artery disease 2011  . Flatback syndrome of thoracolumbar region   . H/O calcium pyrophosphate deposition disease (CPPD)   . History of TIA (transient ischemic attack)   . Hyperlipidemia   . Paroxysmal A-fib ([place])   . Spinal stenosis of lumbar region without neurogenic claudication    Past Surgical History:  Procedure Laterality Date  . BILATERAL CARPAL TUNNEL RELEASE    . CARDIAC CATHETERIZATION    . CIRCUMCISION, NON-NEWBORN    . CORONARY ANGIOPLASTY WITH STENT PLACEMENT  2011   Allergies  Allergies  Allergen Reactions  . Propafenone Anxiety  . Codeine Nausea Only  . Penicillins Rash    History of Present Illness    Jeremy Reese is a 69 y.o. male with a hx of CAD s/p PCI LCx 2010, TIA, bilateral occlusion of ICA monitored by Dr. Lucky Cowboy, permanent atrial fibrillation, HLD, chronic back pain last seen 04/15/2020 by Dr. Rockey Situ.  Cardiac catheteterization in 2010 with PCI to left circumflex.  Per previous documentation cardiac cath at that time with 95% proximal circumflex, 60% mid LAD.  Per previous documentation transesophageal echo 08/11/2010 with PFO, EF 50%, LA moderately dilated, moderate MR, mild to moderate TR.  CT angio head neck 01/2014 with occlusion of both internal carotid arteries at origin with reconstitution intracranially via ECA collaterals and patent left  posterior communicating artery.  He had previous cardioversion 01/2015.  Notes chest pressure/tightness for about a month. Feels similar to previous anginal equivalent. Occurs at rest and with activity but worse with activity. Not associated with dyspnea nor diaphoresis. He has increased Imdur to 2 in the morning, 2 at lunchtime, and 2 at bedtime for the last three weeks. Tells me his pharmacist was concerned he was taking that much and has been taking 1 tablet three times per day for one week. Has not taken nitroglycerin. Tells me he notices more pain since reduced dose of Imdur.  Reports no shortness of breath nor dyspnea on exertion. No edema, orthopnea, PND. Reports no palpitations.   He questions whether he should go back on Tikosyn.  We discussed that Tikosyn would be for rhythm control.  As his atrial fibrillation is overall asymptomatic no indication for rhythm control at this time.  We did discuss that coronary disease can be contributory to some of his fatigue.  Long discussion regarding bilateral occlusion of his carotid arteries.  He wonders if the need to be fixed.  We discussed that they reconstituted the other vessels.  No amaurosis fugax.  No lightheadedness, dizziness, near-syncope, syncope.  EKGs/Labs/Other Studies Reviewed:   The following studies were reviewed today:  EKG:  EKG is ordered today.  The ekg ordered today demonstrates rate controlled atrial fibrillation 82 bpm.  No acute ST/T wave changes.  Recent Labs: 04/15/2020: BUN 15; Creatinine, Ser 0.95; Potassium 5.1; Sodium 140  Recent  Lipid Panel    Component Value Date/Time   CHOL 147 04/02/2015 0816   TRIG 70 04/02/2015 0816   HDL 38 (L) 04/02/2015 0816   CHOLHDL 3.9 04/02/2015 0816   LDLCALC 95 04/02/2015 0816    Home Medications   Current Meds  Medication Sig  . aspirin EC 81 MG tablet Take 1 tablet (81 mg total) by mouth daily. (Patient taking differently: Take 81 mg by mouth 3 (three) times a week. )  .  calcium gluconate 500 MG tablet Take 500 mg by mouth at bedtime.   . carvedilol (COREG) 12.5 MG tablet Take 1 tablet (12.5 mg total) by mouth 2 (two) times daily.  . colchicine 0.6 MG tablet Take 0.6 mg by mouth 2 (two) times daily as needed (gout).   . DOCOSAHEXAENOIC ACID PO Take by mouth.  [name] 5 MG TABS tablet TAKE ONE TABLET TWICE DAILY  . ezetimibe (ZETIA) 10 MG tablet TAKE ONE (1) TABLET EACH DAY  . furosemide (LASIX) 20 MG tablet TAKE TWO TABLET A DAY AS NEEDED  . gabapentin (NEURONTIN) 400 MG capsule Take 400 mg by mouth 3 (three) times daily.   . isosorbide mononitrate (IMDUR) 60 MG 24 hr tablet Take 1 tablet (60 mg total) by mouth in the morning and at bedtime.  . Magnesium Gluconate 250 MG TABS Take 250 mg by mouth daily.  . Melatonin 5 MG TABS Take 5 mg by mouth daily.  . Melatonin-Pyridoxine (MELATONIN TR WITH VITAMIN B6) 3-10 MG TBCR Take by mouth.  . nitroGLYCERIN (NITROSTAT) 0.4 MG SL tablet Place 1 tablet (0.4 mg total) under the tongue every 5 (five) minutes as needed for chest pain.  . potassium chloride (K-DUR) 10 MEQ tablet Take 1 tablet (10 mEq total) by mouth daily as needed.  . rosuvastatin (CRESTOR) 40 MG tablet Take 1 tablet (40 mg total) by mouth daily.  . traMADol (ULTRAM) 50 MG tablet Take 100 mg by mouth every 6 (six) hours as needed.  . [DISCONTINUED] isosorbide mononitrate (IMDUR) 30 MG 24 hr tablet Take 1 tablet (30 mg total) by mouth in the morning and at bedtime. (Patient taking differently: Take 2 tablets in the AM, 2 tablets at lunch and 2 tablets in the PM.)  . [DISCONTINUED] nitroGLYCERIN (NITROSTAT) 0.4 MG SL tablet Place 1 tablet (0.4 mg total) under the tongue every 5 (five) minutes as needed for chest pain.  . [DISCONTINUED] rosuvastatin (CRESTOR) 40 MG tablet Take 1 tablet (40 mg total) by mouth daily.    Review of Systems     Review of Systems  Constitutional: Positive for malaise/fatigue. Negative for chills and fever.  Cardiovascular:  Positive for chest pain and dyspnea on exertion. Negative for irregular heartbeat, leg swelling, near-syncope, orthopnea, palpitations and syncope.  Respiratory: Negative for cough, shortness of breath and wheezing.   Gastrointestinal: Negative for melena, nausea and vomiting.  Genitourinary: Negative for hematuria.  Neurological: Negative for dizziness, light-headedness and weakness.   All other systems reviewed and are otherwise negative except as noted above.  Physical Exam    VS:  BP 130/86 (BP Location: Left Arm, Patient Position: Sitting, Cuff Size: Normal)   Pulse 82   Ht 5\' 11"  (1.803 m)   Wt 256 lb (116.1 kg)   SpO2 96%   BMI 35.70 kg/m  , BMI Body mass index is 35.7 kg/m. GEN: Well nourished, overweight, well developed, in no acute distress. HEENT: normal. Neck: Supple, no JVD or masses. Cardiac: Irregularly irregular, no murmurs, rubs,  or gallops. No clubbing, cyanosis, edema.  Radials/DP/PT 2+ and equal bilaterally.  Respiratory:  Respirations regular and unlabored, clear to auscultation bilaterally. GI: Soft, nontender, nondistended, BS + x 4. MS: No deformity or atrophy. Skin: Warm and dry, no rash. Neuro:  Strength and sensation are intact. Psych: Normal affect.  Assessment & Plan    1. Diastolic heart failure -euvolemic and well compensated on exam today.  Discussed low-sodium, heart healthy diet.  Continue Lasix 40 mg as needed.  Continue Coreg 12.5 mg twice per day.  2. Bilateral carotid artery stenosis -CT 2015 with occlusion of left ICA at origin with reconstitution intracranially via ECA collaterals and patent left posterior communicating artery.  No near syncope, syncope, no amaurosis fugax.  Continue to follow with vascular surgery.  Encouraged to schedule follow-up with Dr. Lucky Cowboy.  3. CAD -Reports worsening symptoms of angina over the last month consistent with his anginal equivalent.  EKG today shows rate controlled atrial fibrillation with no acute ST/T wave  changes.  Plan for cardiac catheterization.  BMP, CBC today.  Previously on Imdur 30 mg twice daily.  He had self increased his Imdur to 60 mg 3 times daily but was cautioned by his pharmacist.  Change Imdur to 60 mg twice daily.  GDMT includes aspirin, beta-blocker, statin, Zetia, long-acting nitrate,PRN nitroglycerin. Refills provided today.   4. Chronic atrial fibrillation -rate controlled by EKG today and asymptomatic with no palpitations.  We discussed plan for rate control and he was agreeable.  Continue Coreg 12.5 mg twice daily.  Anticoagulated appropriately with Eliquis.  5. Chronic anticoagulation -secondary to PAF.  Denies bleeding complications.  Continue Eliquis 5 mg twice daily.  He will hold Eliquis 2 days prior to his planned cardiac catheterization.  6. HLD, LDL goal <70 -lipid panel today.  Continue rosuvastatin 40 mg daily, Zetia 10 mg daily.  7. Morbid obesity -weight loss by diet and exercise encouraged.  We discussed implications on his coronary artery disease.  Would recommend referral to cardiac rehab at follow-up.  Disposition: Labs today.  Cardiac catheterization next week due to angina.  Follow up 1-2 weeks after cardiac cath with Dr. Rockey Situ or APP   Loel Dubonnet, NP 09/05/2020, 4:34 PM

## 2020-09-06 LAB — BASIC METABOLIC PANEL
BUN/Creatinine Ratio: 14 (ref 10–24)
BUN: 13 mg/dL (ref 8–27)
CO2: 24 mmol/L (ref 20–29)
Calcium: 9.3 mg/dL (ref 8.6–10.2)
Chloride: 101 mmol/L (ref 96–106)
Creatinine, Ser: 0.91 mg/dL (ref 0.76–1.27)
GFR calc Af Amer: 100 mL/min/{1.73_m2} (ref >59)
GFR calc non Af Amer: 86 mL/min/{1.73_m2} (ref >59)
Glucose: 98 mg/dL (ref 65–99)
Potassium: 5.1 mmol/L (ref 3.5–5.2)
Sodium: 140 mmol/L (ref 134–144)

## 2020-09-06 LAB — CBC
Hematocrit: 42 % (ref 37.5–51.0)
Hemoglobin: 13.9 g/dL (ref 13.0–17.7)
MCH: 31.9 pg (ref 26.6–33.0)
MCHC: 33.1 g/dL (ref 31.5–35.7)
MCV: 96 fL (ref 79–97)
Platelets: 165 10*3/uL (ref 150–450)
RBC: 4.36 x10E6/uL (ref 4.14–5.80)
RDW: 11.6 % (ref 11.6–15.4)
WBC: 7.3 10*3/uL (ref 3.4–10.8)

## 2020-09-06 LAB — LIPID PANEL
Chol/HDL Ratio: 2.5 ratio (ref 0.0–5.0)
Cholesterol, Total: 108 mg/dL (ref 100–199)
HDL: 44 mg/dL (ref >39)
LDL Chol Calc (NIH): 48 mg/dL (ref 0–99)
Triglycerides: 76 mg/dL (ref 0–149)
VLDL Cholesterol Cal: 16 mg/dL (ref 5–40)

## 2020-09-10 ENCOUNTER — Other Ambulatory Visit
Admission: RE | Admit: 2020-09-10 | Discharge: 2020-09-10 | Disposition: A | Discharge status: Home / Self Care | Payer: Medicare HMO | Source: Ambulatory Visit | Attending: Family | Admitting: Family

## 2020-09-10 ENCOUNTER — Other Ambulatory Visit: Payer: Self-pay

## 2020-09-10 DIAGNOSIS — Z01812 Encounter for preprocedural laboratory examination: Secondary | ICD-10-CM | POA: Diagnosis present

## 2020-09-10 DIAGNOSIS — Z20822 Contact with and (suspected) exposure to covid-19: Secondary | ICD-10-CM | POA: Insufficient documentation

## 2020-09-11 LAB — SARS CORONAVIRUS 2 (TAT 6-24 HRS): SARS Coronavirus 2: NEGATIVE

## 2020-09-12 ENCOUNTER — Inpatient Hospital Stay (HOSPITAL_COMMUNITY): Payer: Medicare HMO

## 2020-09-12 ENCOUNTER — Encounter
Admission: RE | Disposition: A | Discharge status: Other Acute Inpatient Hospital | Payer: Self-pay | Source: Home / Self Care | Attending: Cardiovascular Disease

## 2020-09-12 ENCOUNTER — Ambulatory Visit
Admission: RE | Admit: 2020-09-12 | Discharge: 2020-09-12 | Disposition: A | Discharge status: Other Acute Inpatient Hospital | Payer: Medicare HMO | Attending: Cardiovascular Disease | Admitting: Cardiovascular Disease

## 2020-09-12 ENCOUNTER — Encounter: Payer: Self-pay | Admitting: Cardiovascular Disease

## 2020-09-12 ENCOUNTER — Inpatient Hospital Stay (HOSPITAL_COMMUNITY)
Admission: AD | Admit: 2020-09-12 | Discharge: 2020-09-23 | DRG: 236 | Disposition: A | Discharge status: Home Health | Payer: Medicare HMO | Source: Other Acute Inpatient Hospital | Attending: Cardiothoracic Surgery | Admitting: Cardiothoracic Surgery

## 2020-09-12 DIAGNOSIS — I4821 Permanent atrial fibrillation: Secondary | ICD-10-CM | POA: Diagnosis present

## 2020-09-12 DIAGNOSIS — Z885 Allergy status to narcotic agent status: Secondary | ICD-10-CM | POA: Diagnosis not present

## 2020-09-12 DIAGNOSIS — Z7901 Long term (current) use of anticoagulants: Secondary | ICD-10-CM | POA: Diagnosis not present

## 2020-09-12 DIAGNOSIS — E875 Hyperkalemia: Secondary | ICD-10-CM | POA: Diagnosis not present

## 2020-09-12 DIAGNOSIS — Z955 Presence of coronary angioplasty implant and graft: Secondary | ICD-10-CM | POA: Diagnosis not present

## 2020-09-12 DIAGNOSIS — Z87891 Personal history of nicotine dependence: Secondary | ICD-10-CM

## 2020-09-12 DIAGNOSIS — I739 Peripheral vascular disease, unspecified: Secondary | ICD-10-CM | POA: Diagnosis present

## 2020-09-12 DIAGNOSIS — M549 Dorsalgia, unspecified: Secondary | ICD-10-CM | POA: Diagnosis present

## 2020-09-12 DIAGNOSIS — I110 Hypertensive heart disease with heart failure: Secondary | ICD-10-CM | POA: Diagnosis present

## 2020-09-12 DIAGNOSIS — Z6835 Body mass index (BMI) 35.0-35.9, adult: Secondary | ICD-10-CM | POA: Insufficient documentation

## 2020-09-12 DIAGNOSIS — I25110 Atherosclerotic heart disease of native coronary artery with unstable angina pectoris: Secondary | ICD-10-CM

## 2020-09-12 DIAGNOSIS — I482 Chronic atrial fibrillation, unspecified: Secondary | ICD-10-CM | POA: Diagnosis present

## 2020-09-12 DIAGNOSIS — T82855A Stenosis of coronary artery stent, initial encounter: Secondary | ICD-10-CM | POA: Diagnosis present

## 2020-09-12 DIAGNOSIS — I200 Unstable angina: Secondary | ICD-10-CM

## 2020-09-12 DIAGNOSIS — I5032 Chronic diastolic (congestive) heart failure: Secondary | ICD-10-CM | POA: Diagnosis present

## 2020-09-12 DIAGNOSIS — I6523 Occlusion and stenosis of bilateral carotid arteries: Secondary | ICD-10-CM

## 2020-09-12 DIAGNOSIS — I208 Other forms of angina pectoris: Secondary | ICD-10-CM | POA: Diagnosis not present

## 2020-09-12 DIAGNOSIS — Z7982 Long term (current) use of aspirin: Secondary | ICD-10-CM | POA: Insufficient documentation

## 2020-09-12 DIAGNOSIS — Z8673 Personal history of transient ischemic attack (TIA), and cerebral infarction without residual deficits: Secondary | ICD-10-CM | POA: Insufficient documentation

## 2020-09-12 DIAGNOSIS — I6529 Occlusion and stenosis of unspecified carotid artery: Secondary | ICD-10-CM | POA: Diagnosis present

## 2020-09-12 DIAGNOSIS — R079 Chest pain, unspecified: Secondary | ICD-10-CM

## 2020-09-12 DIAGNOSIS — I358 Other nonrheumatic aortic valve disorders: Secondary | ICD-10-CM | POA: Diagnosis present

## 2020-09-12 DIAGNOSIS — Z9889 Other specified postprocedural states: Secondary | ICD-10-CM

## 2020-09-12 DIAGNOSIS — E782 Mixed hyperlipidemia: Secondary | ICD-10-CM | POA: Diagnosis not present

## 2020-09-12 DIAGNOSIS — R739 Hyperglycemia, unspecified: Secondary | ICD-10-CM | POA: Diagnosis not present

## 2020-09-12 DIAGNOSIS — Z8249 Family history of ischemic heart disease and other diseases of the circulatory system: Secondary | ICD-10-CM

## 2020-09-12 DIAGNOSIS — G8929 Other chronic pain: Secondary | ICD-10-CM | POA: Diagnosis present

## 2020-09-12 DIAGNOSIS — Z951 Presence of aortocoronary bypass graft: Secondary | ICD-10-CM

## 2020-09-12 DIAGNOSIS — Z6834 Body mass index (BMI) 34.0-34.9, adult: Secondary | ICD-10-CM

## 2020-09-12 DIAGNOSIS — Z79899 Other long term (current) drug therapy: Secondary | ICD-10-CM | POA: Diagnosis not present

## 2020-09-12 DIAGNOSIS — Z880 Allergy status to penicillin: Secondary | ICD-10-CM | POA: Diagnosis not present

## 2020-09-12 DIAGNOSIS — I2510 Atherosclerotic heart disease of native coronary artery without angina pectoris: Secondary | ICD-10-CM | POA: Diagnosis not present

## 2020-09-12 DIAGNOSIS — I5030 Unspecified diastolic (congestive) heart failure: Secondary | ICD-10-CM | POA: Diagnosis not present

## 2020-09-12 DIAGNOSIS — Z01810 Encounter for preprocedural cardiovascular examination: Secondary | ICD-10-CM | POA: Diagnosis not present

## 2020-09-12 DIAGNOSIS — I700 Atherosclerosis of aorta: Secondary | ICD-10-CM | POA: Diagnosis present

## 2020-09-12 DIAGNOSIS — E785 Hyperlipidemia, unspecified: Secondary | ICD-10-CM | POA: Diagnosis present

## 2020-09-12 DIAGNOSIS — Y831 Surgical operation with implant of artificial internal device as the cause of abnormal reaction of the patient, or of later complication, without mention of misadventure at the time of the procedure: Secondary | ICD-10-CM | POA: Diagnosis present

## 2020-09-12 DIAGNOSIS — I5031 Acute diastolic (congestive) heart failure: Secondary | ICD-10-CM | POA: Diagnosis not present

## 2020-09-12 HISTORY — PX: LEFT HEART CATH AND CORONARY ANGIOGRAPHY: CATH118249

## 2020-09-12 SURGERY — LEFT HEART CATH AND CORONARY ANGIOGRAPHY
Anesthesia: Moderate Sedation | Laterality: Left

## 2020-09-12 MED ORDER — ONDANSETRON HCL 4 MG/2ML IJ SOLN: 4.0000 mg | Freq: Four times a day (QID) | INTRAMUSCULAR | Status: DC | PRN

## 2020-09-12 MED ORDER — ROSUVASTATIN CALCIUM 20 MG PO TABS
40.0000 mg | ORAL_TABLET | Freq: Every day | ORAL | Status: DC
  Administered 2020-09-12 – 2020-09-17 (×6): 40 mg via ORAL
  Filled 2020-09-12 (×6): qty 2

## 2020-09-12 MED ORDER — SODIUM CHLORIDE 0.9 % IV SOLN: 250.0000 mL | INTRAVENOUS | Status: DC | PRN

## 2020-09-12 MED ORDER — ASPIRIN 81 MG PO CHEW
CHEWABLE_TABLET | ORAL | Status: AC
  Filled 2020-09-12: qty 1

## 2020-09-12 MED ORDER — HYDRALAZINE HCL 20 MG/ML IJ SOLN: 10.0000 mg | Freq: Once | INTRAMUSCULAR | Status: AC

## 2020-09-12 MED ORDER — ASPIRIN 81 MG PO CHEW: 324.0000 mg | CHEWABLE_TABLET | ORAL | Status: DC

## 2020-09-12 MED ORDER — POTASSIUM CHLORIDE CRYS ER 10 MEQ PO TBCR: 10.0000 meq | EXTENDED_RELEASE_TABLET | Freq: Two times a day (BID) | ORAL | Status: DC | PRN

## 2020-09-12 MED ORDER — SODIUM CHLORIDE 0.9 % WEIGHT BASED INFUSION
3.0000 mL/kg/h | INTRAVENOUS | Status: AC
  Administered 2020-09-12: 3 mL/kg/h via INTRAVENOUS

## 2020-09-12 MED ORDER — ASPIRIN 81 MG PO CHEW
81.0000 mg | CHEWABLE_TABLET | ORAL | Status: AC
  Administered 2020-09-12: 81 mg via ORAL

## 2020-09-12 MED ORDER — ASPIRIN EC 81 MG PO TBEC
81.0000 mg | DELAYED_RELEASE_TABLET | Freq: Every day | ORAL | Status: DC
  Administered 2020-09-13 – 2020-09-17 (×5): 81 mg via ORAL
  Filled 2020-09-12 (×6): qty 1

## 2020-09-12 MED ORDER — SODIUM CHLORIDE 0.9% FLUSH: 3.0000 mL | INTRAVENOUS | Status: DC | PRN

## 2020-09-12 MED ORDER — HYDRALAZINE HCL 20 MG/ML IJ SOLN
INTRAMUSCULAR | Status: AC
  Administered 2020-09-12: 10 mg via INTRAVENOUS
  Filled 2020-09-12: qty 1

## 2020-09-12 MED ORDER — ACETAMINOPHEN 325 MG PO TABS
650.0000 mg | ORAL_TABLET | ORAL | Status: DC | PRN
  Administered 2020-09-13: 650 mg via ORAL
  Filled 2020-09-12: qty 2

## 2020-09-12 MED ORDER — HEPARIN (PORCINE) IN NACL 1000-0.9 UT/500ML-% IV SOLN
INTRAVENOUS | Status: DC | PRN
  Administered 2020-09-12: 1000 mL

## 2020-09-12 MED ORDER — IOHEXOL 300 MG/ML  SOLN
INTRAMUSCULAR | Status: DC | PRN
  Administered 2020-09-12: 120 mL

## 2020-09-12 MED ORDER — EZETIMIBE 10 MG PO TABS
10.0000 mg | ORAL_TABLET | Freq: Every day | ORAL | Status: DC
  Administered 2020-09-12 – 2020-09-17 (×6): 10 mg via ORAL
  Filled 2020-09-12 (×6): qty 1

## 2020-09-12 MED ORDER — GABAPENTIN 400 MG PO CAPS
400.0000 mg | ORAL_CAPSULE | Freq: Three times a day (TID) | ORAL | Status: DC
  Administered 2020-09-12 – 2020-09-17 (×17): 400 mg via ORAL
  Filled 2020-09-12 (×17): qty 1

## 2020-09-12 MED ORDER — FUROSEMIDE 20 MG PO TABS: 20.0000 mg | ORAL_TABLET | Freq: Two times a day (BID) | ORAL | Status: DC | PRN

## 2020-09-12 MED ORDER — CARVEDILOL 12.5 MG PO TABS
12.5000 mg | ORAL_TABLET | Freq: Two times a day (BID) | ORAL | Status: DC
  Administered 2020-09-12 – 2020-09-17 (×11): 12.5 mg via ORAL
  Filled 2020-09-12 (×11): qty 1

## 2020-09-12 MED ORDER — ISOSORBIDE MONONITRATE ER 60 MG PO TB24
60.0000 mg | ORAL_TABLET | Freq: Every day | ORAL | Status: DC
  Administered 2020-09-12 – 2020-09-17 (×6): 60 mg via ORAL
  Filled 2020-09-12 (×6): qty 1

## 2020-09-12 MED ORDER — MIDAZOLAM HCL 2 MG/2ML IJ SOLN
INTRAMUSCULAR | Status: AC
  Filled 2020-09-12: qty 2

## 2020-09-12 MED ORDER — ASPIRIN 300 MG RE SUPP: 300.0000 mg | RECTAL | Status: DC

## 2020-09-12 MED ORDER — FENTANYL CITRATE (PF) 100 MCG/2ML IJ SOLN
INTRAMUSCULAR | Status: DC | PRN
Start: 2020-09-12 — End: 2020-09-12
  Administered 2020-09-12 (×2): 50 ug via INTRAVENOUS

## 2020-09-12 MED ORDER — SODIUM CHLORIDE 0.9% FLUSH: 3.0000 mL | Freq: Two times a day (BID) | INTRAVENOUS | Status: DC

## 2020-09-12 MED ORDER — MIDAZOLAM HCL 2 MG/2ML IJ SOLN
INTRAMUSCULAR | Status: DC | PRN
  Administered 2020-09-12: 1 mg via INTRAVENOUS
  Administered 2020-09-12: 2 mg via INTRAVENOUS

## 2020-09-12 MED ORDER — ALPRAZOLAM 0.25 MG PO TABS
0.2500 mg | ORAL_TABLET | Freq: Every evening | ORAL | Status: DC | PRN
  Administered 2020-09-12 – 2020-09-14 (×2): 0.25 mg via ORAL
  Filled 2020-09-12 (×2): qty 1

## 2020-09-12 MED ORDER — SODIUM CHLORIDE 0.9 % WEIGHT BASED INFUSION: 1.0000 mL/kg/h | INTRAVENOUS | Status: DC

## 2020-09-12 MED ORDER — TRAMADOL HCL 50 MG PO TABS
50.0000 mg | ORAL_TABLET | Freq: Two times a day (BID) | ORAL | Status: DC | PRN
  Administered 2020-09-12 – 2020-09-13 (×2): 50 mg via ORAL
  Filled 2020-09-12 (×2): qty 1

## 2020-09-12 MED ORDER — HEPARIN (PORCINE) IN NACL 1000-0.9 UT/500ML-% IV SOLN
INTRAVENOUS | Status: AC
  Filled 2020-09-12: qty 1000

## 2020-09-12 MED ORDER — NITROGLYCERIN 0.4 MG SL SUBL: 0.4000 mg | SUBLINGUAL_TABLET | SUBLINGUAL | Status: DC | PRN

## 2020-09-12 MED ORDER — FENTANYL CITRATE (PF) 100 MCG/2ML IJ SOLN
INTRAMUSCULAR | Status: AC
  Filled 2020-09-12: qty 2

## 2020-09-12 MED ORDER — HEPARIN (PORCINE) 25000 UT/250ML-% IV SOLN
1400.0000 [IU]/h | INTRAVENOUS | Status: DC
  Administered 2020-09-12: 1200 [IU]/h via INTRAVENOUS
  Administered 2020-09-13 – 2020-09-18 (×7): 1400 [IU]/h via INTRAVENOUS
  Filled 2020-09-12 (×8): qty 250

## 2020-09-12 MED ORDER — GABAPENTIN 800 MG PO TABS
400.0000 mg | ORAL_TABLET | Freq: Three times a day (TID) | ORAL | Status: DC
  Filled 2020-09-12 (×2): qty 0.5

## 2020-09-12 SURGICAL SUPPLY — 10 items
CATH INFINITI 5FR JL4 (CATHETERS) ×2 IMPLANT
CATH INFINITI 5FR JL5 (CATHETERS) ×2 IMPLANT
CATH INFINITI JR4 5F (CATHETERS) ×2 IMPLANT
DEVICE CLOSURE MYNXGRIP 5F (Vascular Products) ×2 IMPLANT
KIT PV (KITS) ×2 IMPLANT
KIT RIGHT HEART (MISCELLANEOUS) ×2 IMPLANT
NEEDLE PERC 18GX7CM (NEEDLE) ×2 IMPLANT
PACK CARDIAC CATH (CUSTOM PROCEDURE TRAY) ×2 IMPLANT
SHEATH AVANTI 5FR X 11CM (SHEATH) ×2 IMPLANT
WIRE GUIDERIGHT .035X150 (WIRE) ×2 IMPLANT

## 2020-09-12 NOTE — H&P (Addendum)
Cardiology Admission History and Physical:   Patient ID: NUCHEM GRATTAN MRN: [id number]; DOB: 1951-11-14   Admission date: 09/12/2020  Primary Care Provider: Cipriano Mile, NP [facility] Cardiologist: Ida Rogue, MD  [place] Electrophysiologist:  None   Chief Complaint: Multivessel CAD, evaluation by CVTS for CABG  Patient Profile:   Jeremy Reese is a 69 y.o. male with history of CAD s/p PCI LCx 2010 s/p recent 09/12/2020 catheterization with multivessel CAD, TIA (2012), bilateral occlusion of the ICA monitored by Dr. Lucky Cowboy, permanent atrial fibrillation, hyperlipidemia, chronic back pain, and seen at [facility] with [place] as outlined below with multivessel CAD and transferred to [facility] for CVTS evaluation for CABG.   History of Present Illness:   Jeremy Reese is a 69 year old male with PMH as above and including CAD with recent 09/12/2020 cardiac catheterization showing multivessel CAD.  He underwent cardiac catheterization 2010 with PCI to the left circumflex.  Per previous documentation, TEE 08/11/2010 with PFO, EF 50%, moderate LAE, moderate MR, mild to moderate TR.   CTA head and neck 01/2014 showed occlusion of both internal carotid arteries at the origin with reconstitution intracranially via ECA collaterals and patent left posterior communicating artery.  He underwent cardioversion 01/2015.  He has a history of CAD with previous 2010 LHC and PCI to the left circumflex as above. Per previous documentation, cath at that time showed 95% proximal circumflex, 60% mid LAD.  On 09/05/2020, he presented to [facility] clinic in [place] and reported chest pressure/tightness for approximately 1 month. He stated this CP felt similar to previous anginal equivalent. CP was exertional and nonexertional; however, worse with activity. No associated dyspnea or diaphoresis. He had self increased Imdur to 2 in the morning and 2 at lunchtime, as well as 2 at bedtime for 3 weeks  during the weeks leading up to his appointment. His pharmacist was reportedly concerned that he was taking too much Imdur, so he was taking 1 tablet 3 times per day for 1 week. He had not used his sublingual nitroglycerin. He reported more pain since his Imdur dose has been reduced. He denied any active anginal symptoms of chest pain or shortness of breath during his clinic visit. Due to worsening symptoms over the last month that were consistent with his anginal equivalent, cardiac catheterization was scheduled for 09/12/2020. He was cautioned against taking Imdur multiple times per day and instructed to take Imdur 60 mg twice daily. He is continued on ASA, beta-blocker, statin, Zetia, Imdur, and as needed sublingual nitro.   On 09/12/2020, he presented for [facility] and coronary angiography by Dr. Rockey Situ. This showed ostial circumflex to proximal circumflex 95% stenosis, ostial LAD to pLAD lesion 70% stenosed, and mid LAD lesion 60% stenosed. It was noted that the left mainstem was difficult to engage, as it was a large vessel that bifurcated into the LAD and left circumflex. RCA was a right nondominant vessel with PL and PDA and no significant disease noted. LV gram was not performed given high contrast use. Images were concerning for critical ostial/proximal left circumflex disease, estimated at 95% or greater. In addition, LAD showed moderate to severe ostial/proximal disease, though difficult to visualize, with estimated 60% mid disease noted. Case was discussed with Dr. Fletcher Anon. Given his worsening anginal symptoms over the past several weeks with clear escalation of his Imdur to relieve his CP, as well as critical ostial left circumflex disease of the large vessel (stent stenosis, heavily calcified) nondominant right, and disease of the  LAD, it was thought he would be best served by transfer to [facility] for consideration of CABG. Patient and wife were notified of this plan with agreement. [facility] was  contacted and cardiology as well as CVTS notified of plan. [facility] contacted. Transfer orders placed and pended for release.  Past Medical History:  Diagnosis Date  . Atrial flutter, paroxysmal ([place])   . Carotid artery disease without cerebral infarction ([place])   . Coronary artery disease 2011  . Flatback syndrome of thoracolumbar region   . H/O calcium pyrophosphate deposition disease (CPPD)   . History of TIA (transient ischemic attack)   . Hyperlipidemia   . Paroxysmal A-fib ([place])   . Spinal stenosis of lumbar region without neurogenic claudication     Past Surgical History:  Procedure Laterality Date  . BILATERAL CARPAL TUNNEL RELEASE    . CARDIAC CATHETERIZATION    . CIRCUMCISION, NON-NEWBORN    . CORONARY ANGIOPLASTY WITH STENT PLACEMENT  2011     Medications Prior to Admission: Prior to Admission medications   Medication Sig Start Date End Date Taking? Authorizing Provider  aspirin EC 81 MG tablet Take 1 tablet (81 mg total) by mouth daily. Patient taking differently: Take 81 mg by mouth 3 (three) times a week.  12/05/18   Minna Merritts, MD  calcium gluconate 500 MG tablet Take 500 mg by mouth at bedtime.     [provider]  carvedilol (COREG) 12.5 MG tablet Take 1 tablet (12.5 mg total) by mouth 2 (two) times daily. 09/01/20   Minna Merritts, MD  colchicine 0.6 MG tablet Take 0.6 mg by mouth 2 (two) times daily as needed (gout).     [provider]  DOCOSAHEXAENOIC ACID PO Take by mouth.    [provider]  ELIQUIS 5 MG TABS tablet TAKE ONE TABLET TWICE DAILY 05/01/18   Minna Merritts, MD  ezetimibe (ZETIA) 10 MG tablet TAKE ONE (1) TABLET EACH DAY 02/17/18   Minna Merritts, MD  furosemide (LASIX) 20 MG tablet TAKE TWO TABLET A DAY AS NEEDED 03/31/20   Minna Merritts, MD  gabapentin (NEURONTIN) 400 MG capsule Take 400 mg by mouth 3 (three) times daily.     [provider]  isosorbide mononitrate (IMDUR) 60 MG 24 hr tablet Take 1  tablet (60 mg total) by mouth in the morning and at bedtime. 09/05/20   Loel Dubonnet, NP  Magnesium Gluconate 250 MG TABS Take 250 mg by mouth daily.    [provider]  Melatonin 5 MG TABS Take 5 mg by mouth daily.    [provider]  Melatonin-Pyridoxine (MELATONIN TR WITH VITAMIN B6) 3-10 MG TBCR Take by mouth.    [provider]  nitroGLYCERIN (NITROSTAT) 0.4 MG SL tablet Place 1 tablet (0.4 mg total) under the tongue every 5 (five) minutes as needed for chest pain. 09/05/20   Loel Dubonnet, NP  potassium chloride (K-DUR) 10 MEQ tablet Take 1 tablet (10 mEq total) by mouth daily as needed. 04/29/15   Minna Merritts, MD  rosuvastatin (CRESTOR) 40 MG tablet Take 1 tablet (40 mg total) by mouth daily. 09/05/20   Loel Dubonnet, NP  traMADol (ULTRAM) 50 MG tablet Take 100 mg by mouth every 6 (six) hours as needed.    [provider]     Allergies:    Allergies  Allergen Reactions  . Propafenone Anxiety  . Codeine Nausea Only  . Penicillins Rash  Social History:   Social History   Socioeconomic History  . Marital status: Married    Spouse name: Not on file  . Number of children: Not on file  . Years of education: Not on file  . Highest education level: Not on file  Occupational History  . Not on file  Tobacco Use  . Smoking status: Former Smoker    Packs/day: 0.25    Years: 40.00    Pack years: 10.00    Types: Cigarettes  . Smokeless tobacco: Never Used  Vaping Use  . Vaping Use: Never used  Substance and Sexual Activity  . Alcohol use: Yes    Comment: rare   . Drug use: No  . Sexual activity: Not on file  Other Topics Concern  . Not on file  Social History Narrative  . Not on file   Social Determinants of Health   Financial Resource Strain:   . Difficulty of Paying Living Expenses: Not on file  Food Insecurity:   . Worried About Charity fundraiser in the Last Year: Not on file  . Ran Out of Food in the Last Year:  Not on file  Transportation Needs:   . Lack of Transportation (Medical): Not on file  . Lack of Transportation (Non-Medical): Not on file  Physical Activity:   . Days of Exercise per Week: Not on file  . Minutes of Exercise per Session: Not on file  Stress:   . Feeling of Stress : Not on file  Social Connections:   . Frequency of Communication with Friends and Family: Not on file  . Frequency of Social Gatherings with Friends and Family: Not on file  . Attends Religious Services: Not on file  . Active Member of Clubs or Organizations: Not on file  . Attends Archivist Meetings: Not on file  . Marital Status: Not on file  Intimate Partner Violence:   . Fear of Current or Ex-Partner: Not on file  . Emotionally Abused: Not on file  . Physically Abused: Not on file  . Sexually Abused: Not on file    Family History:   The patient's family history includes Heart disease (age of onset: 99) in his mother; Hypertension in his father.    ROS:  Please see the history of present illness. Over the last month, he notes malaise/fatigue, chest pain, and dyspnea on exertion. On 09/12/2020, it was noted that he experienced chest pressure if he moved; however, no chest pressure if he was ill and did not move around. All other ROS reviewed and negative.     Physical Exam/Data:  There were no vitals filed for this visit. No intake or output data in the 24 hours ending 09/12/20 1522 Last 3 Weights 09/12/2020 09/05/2020 04/15/2020  Weight (lbs) 256 lb 256 lb 250 lb  Weight (kg) 116.121 kg 116.121 kg 113.399 kg     There is no height or weight on file to calculate BMI.  Defer to MD attestation/exam.   EKG:  The ECG that was done 09/05/20 was personally reviewed and demonstrates atrial fibrillation with ventricular rate 82bpm.  Relevant CV Studies: Left heart catheterization and coronary angiography 09/12/2020 Coronary angiography:  Coronary dominance: Left --Left mainstem:   Difficult  to engage , large vessel that bifurcates into the LAD and left circumflex, no significant disease noted --Left anterior descending (LAD):   Large vessel that extends to the apical region, diagonal branch 2 of moderate size, moderate to severe disease 70% estimated  ostial/proximal region, difficult to visualize, also with 60% mid LAD disease --Left circumflex (LCx):  Large vessel with OM branch 2, stent in the ostial to proximal left circumflex region, ostial to proximal region with heavily calcified region, likely 95% or higher stenosis, likely in-stent restenosis --Right coronary artery (RCA):  Right nondominant vessel with PL and PDA, no significant disease noted --Left ventriculography: LV gram not performed , given high contrast use Final Conclusions:   --Difficult to engage left main, --Images very concerning for critical ostial/proximal left circumflex disease estimated 95% or greater --Nondominant right --LAD with moderate to severe ostial/proximal disease though difficult to visualize, and 60% mid disease Recommendations:  --Case discussed with Dr. Fletcher Anon,  --Given his worsening anginal symptoms over the past several weeks with clear escalation of his isosorbide to relieve his pressure --Critical ostial left circumflex disease of a large vessel (stent stenosis, heavily calcified), nondominant right, also with disease of the LAD --He would be best served by consideration of CABG.  --We will discuss with cone, possible transfer today Ida Rogue 09/12/2020, 10:07 AM Coronary Findings Diagnostic Dominance: Right Left Anterior Descending  Ost LAD to Prox LAD lesion is 70% stenosed.  Mid LAD lesion is 60% stenosed.  Left Circumflex  Ost Cx to Prox Cx lesion is 95% stenosed. The lesion was previously treated.  Right Coronary Artery  Vessel is small. There is mild diffuse disease throughout the vessel.  Intervention  No interventions have been documented. Coronary  Diagrams  Diagnostic Dominance: Right    Laboratory Data:  High Sensitivity Troponin:  No results for input(s): TROPONINIHS in the last 720 hours.    ChemistryNo results for input(s): NA, K, CL, CO2, GLUCOSE, BUN, CREATININE, CALCIUM, GFRNONAA, GFRAA, ANIONGAP in the last 168 hours.  No results for input(s): PROT, ALBUMIN, AST, ALT, ALKPHOS, BILITOT in the last 168 hours. HematologyNo results for input(s): WBC, RBC, HGB, HCT, MCV, MCH, MCHC, RDW, PLT in the last 168 hours. BNPNo results for input(s): BNP, PROBNP in the last 168 hours.  DDimer No results for input(s): DDIMER in the last 168 hours.   Radiology/Studies:  CARDIAC CATHETERIZATION  Result Date: 09/12/2020  Ost Cx to Prox Cx lesion is 95% stenosed.  Ost LAD to Prox LAD lesion is 70% stenosed.  Mid LAD lesion is 60% stenosed.      Assessment and Plan:   1. Unstable angina with multivessel CAD and s/p PCI to LCx (2010)  Current chest pain if moving. Chest pressure resolves with staying still. Reports worsening anginal symptoms over the last month with self escalation of Imdur to taking 6 pills daily. Past 2010 LHC as above with PCI to LCx.  10/8: Seen clinic and set up for outpatient LHC on 10/15 with Dr. Rockey Situ. 10/15: LHC showed ostial circumflex to proximal circumflex s95%, ostial LAD to proximal LAD s70%, mid LAD s60%.  Case discussed with interventional team with recommendation for transfer to [facility] for CVTS evaluation for possible CABG. [facility] notified of transfer. EMTALA form completed and [facility] contacted. Orders placed for [facility] and pending under the orders tab.  Daily BMET.10/8 hyperkalemia and K 5.1. Renal function stable with creatinine 0.91 and BUN 13.  Daily CBC. 10/8 Hgb 13.9 with hematocrit 42.0.  Continue to hold anticoagulation /Eliquis 5 mg BID in preparation for possible CABG.  Restart IV heparin approximately 6 PM in 6 to 8 hours s/p sheath pull.  Continue Imdur, SL nitro, BB,  ASA, and statin.   N.p.o. after midnight in  preparation for possible CABG the following day.  Order echo as LV gram not performed during catheterization due to contrast exposure.  Per rounding cardiologist at [facility], note that intervention is high risk due to bilateral occluded carotid arteries and critical disease as outlined above.  Further recommendations per rounding team at [facility].  2. Hyperkalemia  Per 10/8 labs. Recheck BMET upon arrival to [facility].  3. Diastolic heart failure  Euvolemic and well compensated on prior examination.   Repeat echo pending given LV gram unable to be performed as outlined above.   Continue p.o. diuresis and KCl supplementation PRN and as tolerated by Cr given recent exposure to contrast.    Check BMET before restart of diuresis.  Discussed with rounding cardiologist with recommendation to possibly hold diuresis until 10/16 to protect kidney function.   Close monitoring of renal function and electrolytes, particularly in the setting of hyperkalemia as noted above.   Continue BB with Coreg 12.5mg  BID as tolerated by BP.   Monitor I/os, daily standing weights.   Further recommendations if indicated pending echo.  4. Permanent atrial fibrillation  Rate controlled. Continue BB and titrate as needed for goal ventricular rate 110 or lower. No signs or symptoms of bleeding reported. Daily CBC. Continue to hold anticoagulation with home Eliquis 5 mg twice daily over the weekend and in preparation for possible CABG. CHA2DS2VASc score of at least  5 as below (CHF, age, vascular, strokex2).  Restart  per CVTS.  5. Bilateral carotid artery stenosis  01/2014 CTA showed occlusion of both internal carotid arteries at their origins with reconstitution intracranially via ECA collaterals and a patent left posterior communicating artery.  Consider that this may complicate any intervention as noted above.  Defer to CVTS.  Recommend ongoing control of  cholesterol.  Continue statin and ASA.  6. HLD  Continue statin with rosuvastatin 40 mg daily and Zetia 10 mg daily.  7. Obesity  Lifestyle changes, including diet and exercise recommended.  Limited by back pain as above.      TIMI Risk Score for Unstable Angina or Non-ST Elevation MI:   The patient's TIMI risk score is 5, which indicates a 26% risk of all cause mortality, new or recurrent myocardial infarction or need for urgent revascularization in the next 14 days.  New York Heart Association (NYHA) Functional Class No sx   CHA2DS2-VASc Score = 5  This indicates a 7.2% annual risk of stroke. The patient's score is based upon: CHF History: 1 HTN History: 0 Diabetes History: 0 Stroke History: 2 Vascular Disease History: 1 Age Score: 1 Gender Score: 0    Severity of Illness: The appropriate patient status for this patient is INPATIENT. Inpatient status is judged to be reasonable and necessary in order to provide the required intensity of service to ensure the patient's safety. The patient's presenting symptoms, physical exam findings, and initial radiographic and laboratory data in the context of their chronic comorbidities is felt to place them at high risk for further clinical deterioration. Furthermore, it is not anticipated that the patient will be medically stable for discharge from the hospital within 2 midnights of admission. The following factors support the patient status of inpatient.   " The patient's presenting symptoms include Multivessel CAD. " The worrisome physical exam findings include Multivessel CAD. " The initial radiographic and laboratory data are worrisome because of Multivessel CAD. " The chronic co-morbidities include Multivessel CAD..   * I certify that at the point of admission it  is my clinical judgment that the patient will require inpatient hospital care spanning beyond 2 midnights from the point of admission due to high intensity of service, high  risk for further deterioration and high frequency of surveillance required.*    For questions or updates, please contact [place] Please consult [email or web address] for contact info under     Signed, Arvil Chaco, PA-C  09/12/2020 3:22 PM

## 2020-09-12 NOTE — Progress Notes (Signed)
Dr/ Rockey Situ spoke at length at bedside to patient and patient's wife.  Patient needs CABG, will transfer non-emergent to [facility] today, for possible intervention Monday/Tuesday. Patient and wife verbalize understanding.

## 2020-09-12 NOTE — Interval H&P Note (Signed)
History and Physical Interval Note:  09/12/2020 7:28 PM  [name]  has presented today for surgery, with the diagnosis of LT Cath   Unstable angina , CAD,  Previous stent 2010.  The various methods of treatment have been discussed with the patient and family. After consideration of risks, benefits and other options for treatment, the patient has consented to  Procedure(s): LEFT HEART CATH AND CORONARY ANGIOGRAPHY (Left) as a surgical intervention.  The patient's history has been reviewed, patient examined, no change in status, stable for surgery.  I have reviewed the patient's chart and labs.  Questions were answered to the patient's satisfaction.     Jeremy Reese

## 2020-09-12 NOTE — Progress Notes (Signed)
   [facility] and [facility] contacted for transfer to [employer] [facility].   Signed, Arvil Chaco, PA-C 09/12/2020, 10:39 AM

## 2020-09-12 NOTE — H&P (Signed)
H&P Addendum, precardiac catheterization  Patient was seen and evaluated prior to Cardiac catheterization procedure Symptoms, prior testing details again confirmed with the patient Patient examined, no significant change from prior exam Lab work reviewed in detail personally by myself Patient understands risk and benefit of the procedure, willing to proceed  Signed, Tim Gabryella Murfin, MD, Ph.D [facility]    

## 2020-09-12 NOTE — OR Nursing (Signed)
Recieving nurse Ena Dawley on [place]. Report called By Jayme Cloud

## 2020-09-12 NOTE — Progress Notes (Signed)
   09/12/20 1720  Assess: MEWS Score  Temp 98 F (36.7 C)  BP (!) 152/80  Pulse Rate 80  ECG Heart Rate 80  Resp 19  Level of Consciousness Alert  SpO2 92 %  O2 Device Room Air  Patient Activity (if Appropriate) In bed  Assess: MEWS Score  MEWS Temp 0  MEWS Systolic 0  MEWS Pulse 0  MEWS RR 0  MEWS LOC 0  MEWS Score 0  MEWS Score Color Green  Assess: if the MEWS score is Yellow or Red  Were vital signs taken at a resting state? Yes  Focused Assessment No change from prior assessment  Early Detection of Sepsis Score *See Row Information* Low  MEWS guidelines implemented *See Row Information* Yes  Treat  Pain Scale 0-10  Pain Score 0  Take Vital Signs  Increase Vital Sign Frequency  Yellow: Q 2hr X 2 then Q 4hr X 2, if remains yellow, continue Q 4hrs  Document  Patient Outcome Stabilized after interventions  Progress note created (see row info) Yes

## 2020-09-12 NOTE — Progress Notes (Signed)
°   09/12/20 1529  Assess: MEWS Score  Temp 98 F (36.7 C)  BP (!) 202/95  Pulse Rate 79  Resp 20  Level of Consciousness Alert  SpO2 96 %  O2 Device Room Air  Assess: MEWS Score  MEWS Temp 0  MEWS Systolic 2  MEWS Pulse 0  MEWS RR 0  MEWS LOC 0  MEWS Score 2  MEWS Score Color Yellow  Assess: if the MEWS score is Yellow or Red  Were vital signs taken at a resting state? Yes  Focused Assessment No change from prior assessment  Early Detection of Sepsis Score *See Row Information* Low  MEWS guidelines implemented *See Row Information* Yes  Treat  MEWS Interventions Escalated (See documentation below);Administered scheduled meds/treatments  Pain Scale 0-10  Pain Score 0  Take Vital Signs  Increase Vital Sign Frequency  Yellow: Q 2hr X 2 then Q 4hr X 2, if remains yellow, continue Q 4hrs  Escalate  MEWS: Escalate Yellow: discuss with charge nurse/RN and consider discussing with provider and RRT  Notify: Charge Nurse/RN  Name of Charge Nurse/RN Notified Christy, RN  Date Charge Nurse/RN Notified 09/12/20  Time Charge Nurse/RN Notified 1526  Notify: Provider  Provider Name/Title Baghat, PA  Date Provider Notified 09/12/20  Time Provider Notified 1530  Notification Type Page  Notification Reason  (BP 202/95)  Response See new orders  Date of Provider Response 09/12/20  Time of Provider Response 1535  Document  Patient Outcome Other (Comment) (stable in department)  Progress note created (see row info) Yes

## 2020-09-12 NOTE — Progress Notes (Signed)
Paged by nursing for a 2.12 sec pause as reported by telemetry. Pt asymptomatic. Rates generally in the 70-80s. Will continue to monitor.

## 2020-09-12 NOTE — Progress Notes (Signed)
ANTICOAGULATION CONSULT NOTE - Initial Consult  Pharmacy Consult for heparin Indication: chest pain/ACS  Allergies  Allergen Reactions  . Propafenone Anxiety  . Codeine Nausea Only  . Penicillins Rash    Patient Measurements:   Heparin Dosing Weight: 100kg  Vital Signs: Temp: 98 F (36.7 C) (10/15 1529) Temp Source: Oral (10/15 1529) BP: 202/95 (10/15 1529) Pulse Rate: 79 (10/15 1529)  Labs: No results for input(s): HGB, HCT, PLT, APTT, LABPROT, INR, HEPARINUNFRC, HEPRLOWMOCWT, CREATININE, CKTOTAL, CKMB, TROPONINIHS in the last 72 hours.  Estimated Creatinine Clearance: 100.7 mL/min (by C-G formula based on SCr of 0.91 mg/dL).   Medical History: Past Medical History:  Diagnosis Date  . Atrial flutter, paroxysmal ([place])   . Carotid artery disease without cerebral infarction ([place])   . Coronary artery disease 2011  . Flatback syndrome of thoracolumbar region   . H/O calcium pyrophosphate deposition disease (CPPD)   . History of TIA (transient ischemic attack)   . Hyperlipidemia   . Paroxysmal A-fib ([place])   . Spinal stenosis of lumbar region without neurogenic claudication      Assessment: 89 yoM s/p LHC with mvCAD to restart IV heparin 8h after sheath pull while awaiting CABG evaluation. Sheath pulled ~1000. Pt takes apixaban PTA, last dose unknown at this time.  Goal of Therapy:  Heparin level 0.3-0.7 units/ml  APTT 66-102s Monitor platelets by anticoagulation protocol: Yes   Plan:  -Heparin 1200 units/h no bolus at 1800 -Check 6h heparin level and aPTT  Arrie Senate, PharmD, BCPS Clinical Pharmacist [phone] Please check AMION for all [place] numbers 09/12/2020

## 2020-09-12 NOTE — Procedures (Signed)
Came bedside for echo, but patient prefers to finish dinner at this time and wants echo tomorrow

## 2020-09-13 ENCOUNTER — Inpatient Hospital Stay (HOSPITAL_COMMUNITY): Payer: Medicare HMO

## 2020-09-13 DIAGNOSIS — I25110 Atherosclerotic heart disease of native coronary artery with unstable angina pectoris: Secondary | ICD-10-CM

## 2020-09-13 DIAGNOSIS — I2510 Atherosclerotic heart disease of native coronary artery without angina pectoris: Secondary | ICD-10-CM

## 2020-09-13 LAB — BASIC METABOLIC PANEL
Anion gap: 11 (ref 5–15)
BUN: 13 mg/dL (ref 8–23)
CO2: 25 mmol/L (ref 22–32)
Calcium: 9.4 mg/dL (ref 8.9–10.3)
Chloride: 102 mmol/L (ref 98–111)
Creatinine, Ser: 0.88 mg/dL (ref 0.61–1.24)
GFR, Estimated: >60 mL/min (ref >60)
Glucose, Bld: 96 mg/dL (ref 70–99)
Potassium: 3.8 mmol/L (ref 3.5–5.1)
Sodium: 138 mmol/L (ref 135–145)

## 2020-09-13 LAB — HIV ANTIBODY (ROUTINE TESTING W REFLEX): HIV Screen 4th Generation wRfx: NONREACTIVE

## 2020-09-13 LAB — ECHOCARDIOGRAM COMPLETE
AR max vel: 1.83 cm2
AV Area VTI: 1.79 cm2
AV Area mean vel: 1.86 cm2
AV Mean grad: 9 mmHg
AV Peak grad: 14.9 mmHg
Ao pk vel: 1.93 m/s
Height: 71 in
S' Lateral: 3.6 cm
Weight: 3987.2 oz

## 2020-09-13 LAB — APTT
aPTT: 57 seconds — ABNORMAL HIGH (ref 24–36)
aPTT: 75 seconds — ABNORMAL HIGH (ref 24–36)
aPTT: 70 seconds — ABNORMAL HIGH (ref 24–36)
aPTT: 64 seconds — ABNORMAL HIGH (ref 24–36)

## 2020-09-13 LAB — HEPARIN LEVEL (UNFRACTIONATED)
Heparin Unfractionated: 0.37 IU/mL (ref 0.30–0.70)
Heparin Unfractionated: 0.48 IU/mL (ref 0.30–0.70)
Heparin Unfractionated: 0.46 IU/mL (ref 0.30–0.70)

## 2020-09-13 LAB — CBC
HCT: 43.9 % (ref 39.0–52.0)
Hemoglobin: 14.1 g/dL (ref 13.0–17.0)
MCH: 31.4 pg (ref 26.0–34.0)
MCHC: 32.1 g/dL (ref 30.0–36.0)
MCV: 97.8 fL (ref 80.0–100.0)
Platelets: 158 10*3/uL (ref 150–400)
RBC: 4.49 MIL/uL (ref 4.22–5.81)
RDW: 11.9 % (ref 11.5–15.5)
WBC: 8 10*3/uL (ref 4.0–10.5)
nRBC: 0 % (ref 0.0–0.2)

## 2020-09-13 LAB — PROTIME-INR
INR: 1.1 (ref 0.8–1.2)
Prothrombin Time: 13.5 seconds (ref 11.4–15.2)

## 2020-09-13 MED ORDER — TRAMADOL HCL 50 MG PO TABS
50.0000 mg | ORAL_TABLET | Freq: Once | ORAL | Status: AC
  Administered 2020-09-13: 50 mg via ORAL
  Filled 2020-09-13: qty 1

## 2020-09-13 MED ORDER — TRAMADOL HCL 50 MG PO TABS
100.0000 mg | ORAL_TABLET | Freq: Four times a day (QID) | ORAL | Status: DC | PRN
  Administered 2020-09-13 – 2020-09-17 (×11): 100 mg via ORAL
  Filled 2020-09-13 (×11): qty 2

## 2020-09-13 MED ORDER — LOSARTAN POTASSIUM 25 MG PO TABS
25.0000 mg | ORAL_TABLET | Freq: Every day | ORAL | Status: DC
  Filled 2020-09-13: qty 1

## 2020-09-13 MED ORDER — INFLUENZA VAC A&B SA ADJ QUAD 0.5 ML IM PRSY
0.5000 mL | PREFILLED_SYRINGE | INTRAMUSCULAR | Status: DC
  Filled 2020-09-13: qty 0.5

## 2020-09-13 MED ORDER — DOCUSATE SODIUM 100 MG PO CAPS
100.0000 mg | ORAL_CAPSULE | Freq: Every day | ORAL | Status: DC
  Administered 2020-09-13 – 2020-09-17 (×5): 100 mg via ORAL
  Filled 2020-09-13 (×5): qty 1

## 2020-09-13 MED ORDER — LOSARTAN POTASSIUM 50 MG PO TABS: 50.0000 mg | ORAL_TABLET | Freq: Every day | ORAL | Status: DC

## 2020-09-13 MED ORDER — PERFLUTREN LIPID MICROSPHERE
1.0000 mL | INTRAVENOUS | Status: AC | PRN
  Administered 2020-09-13: 3 mL via INTRAVENOUS
  Filled 2020-09-13: qty 10

## 2020-09-13 MED ORDER — LOSARTAN POTASSIUM 25 MG PO TABS
25.0000 mg | ORAL_TABLET | Freq: Every day | ORAL | Status: DC
  Administered 2020-09-13 – 2020-09-15 (×3): 25 mg via ORAL
  Filled 2020-09-13 (×2): qty 1

## 2020-09-13 NOTE — Consult Note (Signed)
[address]       [place] [address]             [id number]        Kadir W Scrivner [place] Medical Record #[id number] Date of Birth: [birth year]  Referring: No ref. provider found Primary Care: Cipriano Mile, NP Primary Cardiologist:Timothy Rockey Situ, MD  Chief Complaint:   No chief complaint on file.   History of Present Illness:     69 year old male transferred from [facility] after undergoing a left heart cath which showed two-vessel coronary artery disease.  He has a history of PCI over 10 years ago and has not had any issues until the last several weeks where he developed progressive exertional dyspnea.  He also admits to some anginal symptoms.  He also has a long history of atrial fibrillation and was treated with Tikosyn with good success but has been off of that for several years as well.     Past Medical and Surgical History: Previous Chest Surgery: No Previous Chest Radiation: No Diabetes Mellitus: No.  HbA1C pending Creatinine: 0.88  Past Medical History:  Diagnosis Date  . Atrial flutter, paroxysmal ([place])   . Carotid artery disease without cerebral infarction ([place])   . Coronary artery disease 2011  . Flatback syndrome of thoracolumbar region   . H/O calcium pyrophosphate deposition disease (CPPD)   . History of TIA (transient ischemic attack)   . Hyperlipidemia   . Paroxysmal A-fib ([place])   . Spinal stenosis of lumbar region without neurogenic claudication     Past Surgical History:  Procedure Laterality Date  . BILATERAL CARPAL TUNNEL RELEASE    . CARDIAC CATHETERIZATION    . CIRCUMCISION, NON-NEWBORN    . CORONARY ANGIOPLASTY WITH STENT PLACEMENT  2011     Social History   Tobacco Use  Smoking Status Former Smoker  . Packs/day: 0.25  . Years: 40.00  . Pack years: 10.00  . Types: Cigarettes  Smokeless Tobacco Never Used    Social History   Substance and Sexual Activity  Alcohol Use Yes    Comment: rare      Allergies  Allergen Reactions  . Propafenone Anxiety  . Codeine Nausea Only  . Penicillins Rash    Medications: Asprin: Yes Statin: Yes Beta Blocker: Yes Ace Inhibitor: No Anti-Coagulation: History of Eliquis last dose on 10/15  Current Facility-Administered Medications  Medication Dose Route Frequency Provider Last Rate Last Admin  . acetaminophen (TYLENOL) tablet 650 mg  650 mg Oral Q4H PRN Marrianne Mood D, PA-C   650 mg at 09/13/20 0617  . ALPRAZolam Duanne Moron) tablet 0.25 mg  0.25 mg Oral QHS PRN Bhagat, Bhavinkumar, PA   0.25 mg at 09/12/20 2147  . aspirin EC tablet 81 mg  81 mg Oral Daily Marrianne Mood D, PA-C   81 mg at 09/13/20 0946  . carvedilol (COREG) tablet 12.5 mg  12.5 mg Oral BID WC Visser, Jacquelyn D, PA-C   12.5 mg at 09/13/20 0837  . docusate sodium (COLACE) capsule 100 mg  100 mg Oral QHS Kroeger, Krista M., PA-C      . ezetimibe (ZETIA) tablet 10 mg  10 mg Oral Daily Marrianne Mood D, PA-C   10 mg at 09/13/20 7591  . furosemide (LASIX) tablet 20 mg  20 mg Oral BID PRN Marrianne Mood D, PA-C      . gabapentin (NEURONTIN) capsule 400 mg  400 mg Oral TID Einar Grad, [facility]  400 mg at 09/13/20 1321  . heparin ADULT infusion 100 units/mL (25000 units/248mL sodium chloride 0.45%)  1,400 Units/hr Intravenous Continuous Franky Macho, RPH 14 mL/hr at 09/13/20 1324 1,400 Units/hr at 09/13/20 1324  . [START ON 09/14/2020] influenza vaccine adjuvanted (FLUAD) injection 0.5 mL  0.5 mL Intramuscular Tomorrow-1000 Chandrasekhar, Mahesh A, MD      . isosorbide mononitrate (IMDUR) 24 hr tablet 60 mg  60 mg Oral Daily Marrianne Mood D, PA-C   60 mg at 09/13/20 0947  . losartan (COZAAR) tablet 25 mg  25 mg Oral Daily Chandrasekhar, Mahesh A, MD   25 mg at 09/13/20 1331  . nitroGLYCERIN (NITROSTAT) SL tablet 0.4 mg  0.4 mg Sublingual Q5 Min x 3 PRN Mickle Plumb, Jacquelyn D, PA-C      . ondansetron [facility]) injection 4 mg  4 mg Intravenous Q6H PRN  Mickle Plumb, Jacquelyn D, PA-C      . potassium chloride (KLOR-CON) CR tablet 10 mEq  10 mEq Oral BID PRN Mickle Plumb, Jacquelyn D, PA-C      . rosuvastatin (CRESTOR) tablet 40 mg  40 mg Oral Daily Marrianne Mood D, PA-C   40 mg at 09/13/20 0946  . traMADol (ULTRAM) tablet 100 mg  100 mg Oral Q6H PRN Tommye Standard, Krista M., PA-C        Medications Prior to Admission  Medication Sig Dispense Refill Last Dose  . aspirin EC 81 MG tablet Take 1 tablet (81 mg total) by mouth daily. (Patient taking differently: Take 81 mg by mouth 3 (three) times a week. ) 90 tablet 3 09/12/2020 at Unknown time  . calcium gluconate 500 MG tablet Take 500 mg by mouth at bedtime.    unknown at unknown  . carvedilol (COREG) 12.5 MG tablet Take 1 tablet (12.5 mg total) by mouth 2 (two) times daily. 180 tablet 1 09/12/2020 at 0700  . ELIQUIS 5 MG TABS tablet TAKE ONE TABLET TWICE DAILY (Patient taking differently: Take 5 mg by mouth 2 (two) times daily. ) 60 tablet 1 Past Week at 0700  . ezetimibe (ZETIA) 10 MG tablet TAKE ONE (1) TABLET EACH DAY (Patient taking differently: Take 10 mg by mouth daily. ) 30 tablet 3 09/11/2020 at Unknown time  . gabapentin (NEURONTIN) 400 MG capsule Take 400 mg by mouth 3 (three) times daily.    09/12/2020 at Unknown time  . isosorbide mononitrate (IMDUR) 60 MG 24 hr tablet Take 1 tablet (60 mg total) by mouth in the morning and at bedtime. 180 tablet 1 09/12/2020 at Unknown time  . Melatonin 5 MG TABS Take 5 mg by mouth at bedtime.    09/11/2020 at Unknown time  . nitroGLYCERIN (NITROSTAT) 0.4 MG SL tablet Place 1 tablet (0.4 mg total) under the tongue every 5 (five) minutes as needed for chest pain. 25 tablet 3 unknown at unknown  . potassium chloride (K-DUR) 10 MEQ tablet Take 1 tablet (10 mEq total) by mouth daily as needed. (Patient taking differently: Take 10 mEq by mouth at bedtime. ) 90 tablet 6 09/11/2020 at Unknown time  . rosuvastatin (CRESTOR) 40 MG tablet Take 1 tablet (40 mg total) by mouth  daily. (Patient taking differently: Take 40 mg by mouth every evening. ) 90 tablet 3 09/11/2020 at Unknown time  . traMADol (ULTRAM) 50 MG tablet Take 100 mg by mouth every 6 (six) hours as needed for moderate pain.    09/12/2020 at Unknown time  . furosemide (LASIX) 20 MG tablet TAKE TWO TABLET A DAY  AS NEEDED (Patient not taking: Reported on 09/12/2020) 180 tablet 0 Not Taking at Unknown time    Family History  Problem Relation Age of Onset  . Heart disease Mother 25       CABG x 3   . Hypertension Father      Review of Systems:   Review of Systems  Constitutional: Negative.   Respiratory: Positive for shortness of breath.   Cardiovascular: Positive for chest pain. Negative for orthopnea.  Gastrointestinal: Negative.   Neurological: Negative.       Physical Exam: BP 126/79   Pulse 83   Temp 97.8 F (36.6 C) (Oral)   Resp 20   Wt 113 kg   SpO2 94%   BMI 34.76 kg/m  Physical Exam Constitutional:      Appearance: Normal appearance. He is obese.  HENT:     Head: Normocephalic and atraumatic.  Eyes:     Extraocular Movements: Extraocular movements intact.     Conjunctiva/sclera: Conjunctivae normal.  Cardiovascular:     Rate and Rhythm: Normal rate. Rhythm irregular.  Pulmonary:     Effort: Pulmonary effort is normal. No respiratory distress.  Musculoskeletal:     Cervical back: Normal range of motion.  Neurological:     General: No focal deficit present.     Mental Status: He is alert and oriented to person, place, and time.       Diagnostic Studies & Laboratory data:    Left Heart Catherization: Left heart cath was reviewed.  He has good targets in the LAD and circumflex distribution.  There is no significant disease in the RCA.  The RCA is also a nondominant vessel.  Echo: Pending   I have independently reviewed the above radiologic studies and discussed with the patient   Recent Lab Findings: Lab Results  Component Value Date   WBC 8.0 09/13/2020    HGB 14.1 09/13/2020   HCT 43.9 09/13/2020   PLT 158 09/13/2020   GLUCOSE 96 09/13/2020   CHOL 108 09/05/2020   TRIG 76 09/05/2020   HDL 44 09/05/2020   LDLCALC 48 09/05/2020   NA 138 09/13/2020   K 3.8 09/13/2020   CL 102 09/13/2020   CREATININE 0.88 09/13/2020   BUN 13 09/13/2020   CO2 25 09/13/2020   INR 1.1 09/13/2020      Assessment / Plan:   69 year old male with history of coronary artery disease, and chronic atrial fibrillation.  He has requested to be evaluated by Dr. Prescott Gum for surgical bypass.  We discussed several options which include placement of an atrial clip, pulmonary vein isolation, and a Maze procedure in conjunction with a CABG.  Given his longstanding atrial fibrillation I am not sure that an ablation procedure will be successful but will wait to look at the dimensions of his left atrium on echocardiogram.  I think that he at least would be a good candidate for a CABG/atrial clip but will defer this decision to Dr. Prescott Gum.     I  spent 40 minutes counseling the patient face to face.   Lajuana Matte 09/13/2020 2:58 PM

## 2020-09-13 NOTE — Progress Notes (Signed)
[place] for heparin Indication: chest pain/ACS  Allergies  Allergen Reactions  . Propafenone Anxiety  . Codeine Nausea Only  . Penicillins Rash    Patient Measurements: Weight: 113 kg (249 lb 3.2 oz) Heparin Dosing Weight: 100kg  Vital Signs: BP: 126/79 (10/16 1227) Pulse Rate: 83 (10/16 0837)  Labs: Recent Labs    09/13/20 0124 09/13/20 0124 09/13/20 0813 09/13/20 0939 09/13/20 1611  HGB 14.1  --   --   --   --   HCT 43.9  --   --   --   --   PLT 158  --   --   --   --   APTT 57*   < > 75* 70* 64*  LABPROT 13.5  --   --   --   --   INR 1.1  --   --   --   --   HEPARINUNFRC 0.37  --   --  0.48 0.46  CREATININE 0.88  --   --   --   --    < > = values in this interval not displayed.    Estimated Creatinine Clearance: 102.7 mL/min (by C-G formula based on SCr of 0.88 mg/dL).   Assessment: 78 yoM s/p LHC with mvCAD to restart IV heparin 8h after sheath pull while awaiting CABG evaluation. Sheath pulled ~1000. Pt takes apixaban PTA, last dose unknown at this time.  Heparin level remains therapeutic and from last 3 levels appears to not be affected from last apixaban dose, will follow only heparin levels from now on  Goal of Therapy:  Heparin level 0.3-0.7 units/ml  Monitor platelets by anticoagulation protocol: Yes   Plan:  Continue heparin gtt at 1400 units/hr Daily heparin level, CBC, s/s bleeding  Bertis Ruddy, PharmD Clinical Pharmacist ED Pharmacist Phone # [phone] 09/13/2020 5:35 PM

## 2020-09-13 NOTE — Progress Notes (Signed)
  Echocardiogram 2D Echocardiogram has been performed.  Fidel Levy 09/13/2020, 11:13 AM

## 2020-09-13 NOTE — Progress Notes (Signed)
Progress Note  Patient Name: Jeremy Reese Date of Encounter: 09/13/2020  [facility] Cardiologist: Ida Rogue, MD   Subjective  Patient found to have multivessel CAD on cath yesterday prompting transfer to [facility] for surgical evaluation for possible ACB.  Patient states that he is doing well, but is hungry from being NPO. No chest pain/pressure at rest.  Inpatient Medications    Scheduled Meds: . aspirin EC  81 mg Oral Daily  . carvedilol  12.5 mg Oral BID WC  . docusate sodium  100 mg Oral QHS  . ezetimibe  10 mg Oral Daily  . gabapentin  400 mg Oral TID  . [START ON 09/14/2020] influenza vaccine adjuvanted  0.5 mL Intramuscular Tomorrow-1000  . isosorbide mononitrate  60 mg Oral Daily  . rosuvastatin  40 mg Oral Daily   Continuous Infusions: . heparin 1,400 Units/hr (09/13/20 0348)   PRN Meds: acetaminophen, ALPRAZolam, furosemide, nitroGLYCERIN, ondansetron (ZOFRAN) IV, potassium chloride, traMADol   Vital Signs    Vitals:   09/12/20 2354 09/13/20 0300 09/13/20 0354 09/13/20 0837  BP:  (!) 168/109 134/90 (!) 161/109  Pulse: 81 85  83  Resp: 20 20    Temp: 97.8 F (36.6 C) 97.8 F (36.6 C)    TempSrc: Oral Oral    SpO2: 97% 97% 94%   Weight:  113 kg      Intake/Output Summary (Last 24 hours) at 09/13/2020 1035 Last data filed at 09/13/2020 0845 Gross per 24 hour  Intake 300 ml  Output 1800 ml  Net -1500 ml   Last 3 Weights 09/13/2020 09/12/2020 09/05/2020  Weight (lbs) 249 lb 3.2 oz 256 lb 256 lb  Weight (kg) 113.036 kg 116.121 kg 116.121 kg      Telemetry    Afib - Personally Reviewed  ECG    Afib with early r-wave progression. No ischemia - Personally Reviewed  Physical Exam   GEN: No acute distress.   Neck: No JVD Cardiac: Irregular, 2/6 SEM, no rubs, or gallops.  Respiratory: Clear to auscultation bilaterally. GI: Soft, nontender, non-distended  MS: No edema; No deformity. Neuro:  Nonfocal  Psych: Normal affect   Labs     High Sensitivity Troponin:  No results for input(s): TROPONINIHS in the last 720 hours.    Chemistry Recent Labs  Lab 09/13/20 0124  NA 138  K 3.8  CL 102  CO2 25  GLUCOSE 96  BUN 13  CREATININE 0.88  CALCIUM 9.4  GFRNONAA >60  ANIONGAP 11     Hematology Recent Labs  Lab 09/13/20 0124  WBC 8.0  RBC 4.49  HGB 14.1  HCT 43.9  MCV 97.8  MCH 31.4  MCHC 32.1  RDW 11.9  PLT 158    BNPNo results for input(s): BNP, PROBNP in the last 168 hours.   DDimer No results for input(s): DDIMER in the last 168 hours.   Radiology    CARDIAC CATHETERIZATION  Result Date: 09/12/2020  Colon Flattery Cx to Prox Cx lesion is 95% stenosed.  Ost LAD to Prox LAD lesion is 70% stenosed.  Mid LAD lesion is 60% stenosed.     Cardiac Studies   LHC 09/12/20: Coronary angiography:  Coronary dominance: Left  Left mainstem:   Difficult to engage , large vessel that bifurcates into the LAD and left circumflex, no significant disease noted  Left anterior descending (LAD):   Large vessel that extends to the apical region, diagonal branch 2 of moderate size, moderate to severe disease 70% estimated  ostial/proximal region, difficult to visualize, also with 60% mid LAD disease  Left circumflex (LCx):  Large vessel with OM branch 2, stent in the ostial to proximal left circumflex region, ostial to proximal region with heavily calcified region, likely 95% or higher stenosis, likely in-stent restenosis  Right coronary artery (RCA):  Right nondominant vessel with PL and PDA, no significant disease noted  Left ventriculography: LV gram not performed , given high contrast use  Final Conclusions:   Difficult to engage left main, Images very concerning for critical ostial/proximal left circumflex disease estimated 95% or greater Nondominant right LAD with moderate to severe ostial/proximal disease though difficult to visualize, and 60% mid disease   Recommendations:  Case discussed with Dr. Fletcher Anon,   Given his worsening anginal symptoms over the past several weeks with clear escalation of his isosorbide to relieve his pressure Critical ostial left circumflex disease of a large vessel (stent stenosis, heavily calcified), nondominant right, also with disease of the LAD He would be best served by consideration of CABG.  We will discuss with cone, possible transfer today  TTE pending Patient Profile     69 y.o. male with a history of  CAD s/p PCI LCx 2010, TIA (2012), bilateral occlusion of ICA monitored by Dr. Lucky Cowboy, permanent atrial fibrillation, HLD, chronic back pain presented with chest pain found to have multivessel CAD on cath now awaiting surgery evaluation for possible ACB.  Assessment & Plan    #Multivessel CAD #Chest Pain: Patient with increasing angina fond to have multivessel CAD on cath prompting transfer for surgical evaluation for possible ACB. -LHC with 70% ostial LAD, 60% mid LAD, ostial LCx 95%, RCA without significant disease -CV surgery consulted for consideration of ACB -Continue heparin gtt -Continue ASA 81mg  daily -Continue crestor 40mg  daily -Coreg 12.5mg  BID -Continue home imdur -Blood pressure elevated, start losartan 50mg  daily -TTE pending  #HTN: Bps elevated in 160s/100s. -Continue home coreg -Continue home imdur -Start losartan 50mg  daily  #Bilateral carotid artery stenosis CT 2015 with occlusion of left ICA at origin. No stroke like symptoms. -Follow with vascular as scheduled -Continue ASA and stain as above  #Chronic Afib: -Well rate controlled -Continue coreg 12.5mg  daily -On heparin gtt for [facility] given plans for possible ACB  #HLD: -Crestor 40mg  -Zetia 10mg   #Morbid Obesity: -Consider follow-up with weight loss clinic -Plan for cardiac rehab  For questions or updates, please contact [facility] Please consult [email or web address] for contact info under        Signed, Freada Bergeron, MD  09/13/2020, 10:35 AM

## 2020-09-13 NOTE — Progress Notes (Signed)
[place] for heparin Indication: chest pain/ACS  Allergies  Allergen Reactions  . Propafenone Anxiety  . Codeine Nausea Only  . Penicillins Rash    Patient Measurements: Weight: 113 kg (249 lb 3.2 oz) Heparin Dosing Weight: 100kg  Vital Signs: Temp: 97.8 F (36.6 C) (10/16 0300) Temp Source: Oral (10/16 0300) BP: 161/109 (10/16 0837) Pulse Rate: 83 (10/16 0837)  Labs: Recent Labs    09/13/20 0124 09/13/20 0813 09/13/20 0939  HGB 14.1  --   --   HCT 43.9  --   --   PLT 158  --   --   APTT 57* 75* 70*  LABPROT 13.5  --   --   INR 1.1  --   --   HEPARINUNFRC 0.37  --  0.48  CREATININE 0.88  --   --     Estimated Creatinine Clearance: 102.7 mL/min (by C-G formula based on SCr of 0.88 mg/dL).   Assessment: 35 yoM s/p LHC with mvCAD to restart IV heparin 8h after sheath pull while awaiting CABG evaluation. Sheath pulled ~1000. Pt takes apixaban PTA, last dose unknown at this time.  Heparin level 0.48 and PTT 70 sec both therapeutic on gtt at 1400 units/hr. No issues with line or bleeding reported per RN.  Goal of Therapy:  Heparin level 0.3-0.7 units/ml  APTT 66-102 sec Monitor platelets by anticoagulation protocol: Yes   Plan:  Continue heparin 1400 units/hr Check 6 hr confirmatory heparin level and APTT, if correlating, can d/c APTT monitoring Daily heparin level and APTT Monitor for s/s of bleeding  Lorel Monaco, PharmD PGY2 Ambulatory Care Resident [place]

## 2020-09-13 NOTE — Progress Notes (Signed)
[place] for heparin Indication: chest pain/ACS  Allergies  Allergen Reactions  . Propafenone Anxiety  . Codeine Nausea Only  . Penicillins Rash    Patient Measurements:   Heparin Dosing Weight: 100kg  Vital Signs: Temp: 97.8 F (36.6 C) (10/15 2354) Temp Source: Oral (10/15 2354) BP: 152/80 (10/15 1720) Pulse Rate: 81 (10/15 2354)  Labs: Recent Labs    09/13/20 0124  HGB 14.1  HCT 43.9  PLT 158  APTT 57*  LABPROT 13.5  INR 1.1  HEPARINUNFRC 0.37    Estimated Creatinine Clearance: 100.7 mL/min (by C-G formula based on SCr of 0.91 mg/dL).   Assessment: 34 yoM s/p LHC with mvCAD to restart IV heparin 8h after sheath pull while awaiting CABG evaluation. Sheath pulled ~1000. Pt takes apixaban PTA, last dose unknown at this time.  Heparin level 0.37 (affected by apixaban), PTT 57 sec (subtherapeutic) on gtt at 1200 units/hr. No issues with line or bleeding reported per RN.  Goal of Therapy:  Heparin level 0.3-0.7 units/ml  APTT 66-102 sec Monitor platelets by anticoagulation protocol: Yes   Plan:  Increase heparin to 1400 units/hr Will f/u 6 hr PTT  Sherlon Handing, PharmD, BCPS Please see amion for complete clinical pharmacist phone list 09/13/2020 3:25 AM

## 2020-09-14 DIAGNOSIS — I2510 Atherosclerotic heart disease of native coronary artery without angina pectoris: Secondary | ICD-10-CM | POA: Diagnosis not present

## 2020-09-14 LAB — CBC
HCT: 45.7 % (ref 39.0–52.0)
Hemoglobin: 14.8 g/dL (ref 13.0–17.0)
MCH: 31.4 pg (ref 26.0–34.0)
MCHC: 32.4 g/dL (ref 30.0–36.0)
MCV: 96.8 fL (ref 80.0–100.0)
Platelets: 149 10*3/uL — ABNORMAL LOW (ref 150–400)
RBC: 4.72 MIL/uL (ref 4.22–5.81)
RDW: 12.1 % (ref 11.5–15.5)
WBC: 8.2 10*3/uL (ref 4.0–10.5)
nRBC: 0 % (ref 0.0–0.2)

## 2020-09-14 LAB — HEPARIN LEVEL (UNFRACTIONATED): Heparin Unfractionated: 0.55 IU/mL (ref 0.30–0.70)

## 2020-09-14 MED ORDER — ALPRAZOLAM 0.25 MG PO TABS
0.2500 mg | ORAL_TABLET | Freq: Once | ORAL | Status: AC
  Administered 2020-09-14: 0.25 mg via ORAL
  Filled 2020-09-14: qty 1

## 2020-09-14 NOTE — Progress Notes (Signed)
Patient had a 2.05 seconds pause at 0354. Patient was awake at the time and asymptomatic.  Will continue to monitor.

## 2020-09-14 NOTE — Progress Notes (Signed)
ANTICOAGULATION CONSULT NOTE  Pharmacy Consult for heparin Indication: chest pain/ACS  Allergies  Allergen Reactions   Propafenone Anxiety   Codeine Nausea Only   Penicillins Rash    Patient Measurements: Weight: 112.9 kg (248 lb 14.4 oz) Heparin Dosing Weight: 100kg  Vital Signs: Temp: 98 F (36.7 C) (10/17 0443) Temp Source: Oral (10/17 0443) BP: 160/96 (10/17 0443) Pulse Rate: 77 (10/17 0443)  Labs: Recent Labs    09/13/20 0124 09/13/20 0124 09/13/20 0813 09/13/20 0939 09/13/20 1611 09/14/20 0150  HGB 14.1  --   --   --   --  14.8  HCT 43.9  --   --   --   --  45.7  PLT 158  --   --   --   --  149*  APTT 57*   < > 75* 70* 64*  --   LABPROT 13.5  --   --   --   --   --   INR 1.1  --   --   --   --   --   HEPARINUNFRC 0.37   < >  --  0.48 0.46 0.55  CREATININE 0.88  --   --   --   --   --    < > = values in this interval not displayed.    Estimated Creatinine Clearance: 102.6 mL/min (by C-G formula based on SCr of 0.88 mg/dL).   Assessment: 42 yoM s/p LHC with mvCAD to restart IV heparin 8h after sheath pull while awaiting CABG evaluation. Sheath pulled ~1000. Pt takes apixaban PTA, last dose unknown at this time.  Heparin level remains therapeutic aat 0.55, CBC stable.  Goal of Therapy:  Heparin level 0.3-0.7 units/ml  Monitor platelets by anticoagulation protocol: Yes   Plan:  Continue heparin gtt at 1400 units/hr Daily heparin level, CBC, s/s bleeding  Arrie Senate, PharmD, BCPS Clinical Pharmacist [phone] Please check AMION for all [place] numbers 09/14/2020

## 2020-09-14 NOTE — Progress Notes (Signed)
Progress Note  Patient Name: Jeremy Reese Date of Encounter: 09/14/2020  [facility] Cardiologist: Jeremy Rogue, MD   Subjective  Feels well. No chest pain or SOB at rest. Remains on heparin gtt.  Seen by CV surgery with plans for CABG with possible atrial clip.TTE with normal BiV function, severely enlarged LA, aortic valve calcification/sclerosis without stenosis BP significantly improved to 110-120s this morning  Inpatient Medications    Scheduled Meds: . aspirin EC  81 mg Oral Daily  . carvedilol  12.5 mg Oral BID WC  . docusate sodium  100 mg Oral QHS  . ezetimibe  10 mg Oral Daily  . gabapentin  400 mg Oral TID  . influenza vaccine adjuvanted  0.5 mL Intramuscular Tomorrow-1000  . isosorbide mononitrate  60 mg Oral Daily  . losartan  25 mg Oral Daily  . rosuvastatin  40 mg Oral Daily   Continuous Infusions: . heparin 1,400 Units/hr (09/14/20 0707)   PRN Meds: acetaminophen, ALPRAZolam, furosemide, nitroGLYCERIN, ondansetron (ZOFRAN) IV, potassium chloride, traMADol   Vital Signs    Vitals:   09/13/20 2050 09/14/20 0443 09/14/20 0840 09/14/20 1036  BP: 101/69 (!) 160/96 123/75 111/84  Pulse: 86 77 78   Resp: 20 18    Temp: 99.1 F (37.3 C) 98 F (36.7 C)    TempSrc: Oral Oral    SpO2:      Weight:  112.9 kg      Intake/Output Summary (Last 24 hours) at 09/14/2020 1107 Last data filed at 09/14/2020 0859 Gross per 24 hour  Intake 420 ml  Output 750 ml  Net -330 ml   Last 3 Weights 09/14/2020 09/13/2020 09/12/2020  Weight (lbs) 248 lb 14.4 oz 249 lb 3.2 oz 256 lb  Weight (kg) 112.9 kg 113.036 kg 116.121 kg      Telemetry    Afib - Personally Reviewed  ECG    Afib with early r-wave progression. No ischemia - Personally Reviewed  Physical Exam   GEN: No acute distress.   Neck: No JVD Cardiac: Irregular, 2/6 SEM, no rubs, or gallops.  Respiratory: Clear to auscultation bilaterally. GI: Soft, nontender, non-distended  MS: No edema;  No deformity. Neuro:  Nonfocal  Psych: Normal affect   Labs    High Sensitivity Troponin:  No results for input(s): TROPONINIHS in the last 720 hours.    Chemistry Recent Labs  Lab 09/13/20 0124  NA 138  K 3.8  CL 102  CO2 25  GLUCOSE 96  BUN 13  CREATININE 0.88  CALCIUM 9.4  GFRNONAA >60  ANIONGAP 11     Hematology Recent Labs  Lab 09/13/20 0124 09/14/20 0150  WBC 8.0 8.2  RBC 4.49 4.72  HGB 14.1 14.8  HCT 43.9 45.7  MCV 97.8 96.8  MCH 31.4 31.4  MCHC 32.1 32.4  RDW 11.9 12.1  PLT 158 149*    BNPNo results for input(s): BNP, PROBNP in the last 168 hours.   DDimer No results for input(s): DDIMER in the last 168 hours.   Radiology    ECHOCARDIOGRAM COMPLETE  Result Date: 09/13/2020    ECHOCARDIOGRAM REPORT   Patient Name:   Jeremy Reese Date of Exam: 09/13/2020 Medical Rec #:  [id number]         Height:       71.0 in Accession #:    [id number]        Weight:       249.2 lb Date of Birth:  [birth year]  BSA:          2.315 m Patient Age:    69 years          BP:           134/90 mmHg Patient Gender: M                 HR:           79 bpm. Exam Location:  Inpatient Procedure: 2D Echo, Cardiac Doppler, Color Doppler and Intracardiac            Opacification Agent Indications:    CAD Native Vessel 414.01 / I25.10  History:        Patient has no prior history of Echocardiogram examinations.                 CAD, TIA, Arrythmias:Atrial Fibrillation and Atrial Flutter;                 Risk Factors:Dyslipidemia.  Sonographer:    Bernadene Person RDCS Referring Phys: [id number] [place]  1. Left ventricular ejection fraction, by estimation, is 60 to 65%. The left ventricle has normal function. The left ventricle has no regional wall motion abnormalities. There is moderate left ventricular hypertrophy. Left ventricular diastolic parameters are indeterminate.  2. Right ventricular systolic function is normal. The right ventricular size is normal.  3. Left  atrial size was severely dilated.  4. Right atrial size was mildly dilated.  5. The mitral valve is grossly normal. Mild mitral valve regurgitation. No evidence of mitral stenosis. Moderate mitral annular calcification.  6. The aortic valve is abnormal. There is severe calcifcation of the aortic valve. Aortic valve regurgitation is not visualized. Aortic valve sclerosis/calcification is present, without any evidence of aortic stenosis. Mean systolic gradient 9 mmHg. FINDINGS  Left Ventricle: Left ventricular ejection fraction, by estimation, is 60 to 65%. The left ventricle has normal function. The left ventricle has no regional wall motion abnormalities. Definity contrast agent was given IV to delineate the left ventricular  endocardial borders. The left ventricular internal cavity size was normal in size. There is moderate left ventricular hypertrophy. Left ventricular diastolic parameters are indeterminate. Right Ventricle: The right ventricular size is normal. No increase in right ventricular wall thickness. Right ventricular systolic function is normal. Left Atrium: Left atrial size was severely dilated. Right Atrium: Right atrial size was mildly dilated. Pericardium: There is no evidence of pericardial effusion. Mitral Valve: The mitral valve is grossly normal. Moderate mitral annular calcification. Mild mitral valve regurgitation. No evidence of mitral valve stenosis. Tricuspid Valve: The tricuspid valve is normal in structure. Tricuspid valve regurgitation is trivial. No evidence of tricuspid stenosis. Aortic Valve: The aortic valve is abnormal. There is severe calcifcation of the aortic valve. Aortic valve regurgitation is not visualized. Aortic valve sclerosis/calcification is present, without any evidence of aortic stenosis. Aortic valve mean gradient measures 9.0 mmHg. Aortic valve peak gradient measures 14.9 mmHg. Aortic valve area, by VTI measures 1.79 cm. Pulmonic Valve: The pulmonic valve was normal  in structure. Pulmonic valve regurgitation is trivial. No evidence of pulmonic stenosis. Aorta: The aortic root is normal in size and structure. Venous: The inferior vena cava was not well visualized. IAS/Shunts: The interatrial septum was not well visualized.  LEFT VENTRICLE PLAX 2D LVIDd:         5.20 cm LVIDs:         3.60 cm LV PW:         1.35  cm LV IVS:        1.37 cm LVOT diam:     2.20 cm LV SV:         69 LV SV Index:   30 LVOT Area:     3.80 cm  RIGHT VENTRICLE TAPSE (M-mode): 1.6 cm LEFT ATRIUM              Index       RIGHT ATRIUM           Index LA diam:        5.90 cm  2.55 cm/m  RA Area:     19.10 cm LA Vol (A2C):   149.0 ml 64.36 ml/m RA Volume:   50.10 ml  21.64 ml/m LA Vol (A4C):   175.0 ml 75.59 ml/m LA Biplane Vol: 165.0 ml 71.27 ml/m  AORTIC VALVE AV Area (Vmax):    1.83 cm AV Area (Vmean):   1.86 cm AV Area (VTI):     1.79 cm AV Vmax:           193.00 cm/s AV Vmean:          139.000 cm/s AV VTI:            0.383 m AV Peak Grad:      14.9 mmHg AV Mean Grad:      9.0 mmHg LVOT Vmax:         92.78 cm/s LVOT Vmean:        68.050 cm/s LVOT VTI:          0.180 m LVOT/AV VTI ratio: 0.47  AORTA Ao Root diam: 3.50 cm Ao Asc diam:  2.80 cm TRICUSPID VALVE TR Peak grad:   28.5 mmHg TR Vmax:        267.00 cm/s  SHUNTS Systemic VTI:  0.18 m Systemic Diam: 2.20 cm Cherlynn Kaiser MD Electronically signed by Cherlynn Kaiser MD Signature Date/Time: 09/13/2020/4:13:32 PM    Final     Cardiac Studies  TTE 09/13/20: IMPRESSIONS  1. Left ventricular ejection fraction, by estimation, is 60 to 65%. The  left ventricle has normal function. The left ventricle has no regional  wall motion abnormalities. There is moderate left ventricular hypertrophy.  Left ventricular diastolic  parameters are indeterminate.  2. Right ventricular systolic function is normal. The right ventricular  size is normal.  3. Left atrial size was severely dilated.  4. Right atrial size was mildly dilated.  5. The  mitral valve is grossly normal. Mild mitral valve regurgitation.  No evidence of mitral stenosis. Moderate mitral annular calcification.  6. The aortic valve is abnormal. There is severe calcifcation of the  aortic valve. Aortic valve regurgitation is not visualized. Aortic valve  sclerosis/calcification is present, without any evidence of aortic  stenosis. Mean systolic gradient 9 mmHg.    LHC 09/12/20: Coronary angiography:  Coronary dominance: Left  Left mainstem:   Difficult to engage , large vessel that bifurcates into the LAD and left circumflex, no significant disease noted  Left anterior descending (LAD):   Large vessel that extends to the apical region, diagonal branch 2 of moderate size, moderate to severe disease 70% estimated ostial/proximal region, difficult to visualize, also with 60% mid LAD disease  Left circumflex (LCx):  Large vessel with OM branch 2, stent in the ostial to proximal left circumflex region, ostial to proximal region with heavily calcified region, likely 95% or higher stenosis, likely in-stent restenosis  Right coronary artery (RCA):  Right nondominant vessel with PL  and PDA, no significant disease noted  Left ventriculography: LV gram not performed , given high contrast use  Final Conclusions:   Difficult to engage left main, Images very concerning for critical ostial/proximal left circumflex disease estimated 95% or greater Nondominant right LAD with moderate to severe ostial/proximal disease though difficult to visualize, and 60% mid disease   Recommendations:  Case discussed with Dr. Fletcher Anon,  Given his worsening anginal symptoms over the past several weeks with clear escalation of his isosorbide to relieve his pressure Critical ostial left circumflex disease of a large vessel (stent stenosis, heavily calcified), nondominant right, also with disease of the LAD He would be best served by consideration of CABG.  We will discuss with cone, possible  transfer today  Patient Profile     69 y.o. male with a history of  CAD s/p PCI LCx 2010, TIA (2012), bilateral occlusion of ICA monitored by Dr. Lucky Cowboy, permanent atrial fibrillation, HLD, chronic back pain presented with chest pain found to have multivessel CAD on cath now awaiting surgery evaluation for possible ACB.  Assessment & Plan    #Multivessel CAD #Chest Pain: Patient with increasing angina fond to have multivessel CAD on cath prompting transfer for surgical evaluation for possible ACB. -LHC with 70% ostial LAD, 60% mid LAD, ostial LCx 95%, RCA without significant disease -TTE with normal BiV function, severe LAE, aortic sclerosis without stenosis -CV surgery consulted with plans for possible ACB+/- atrial clip -Continue heparin gtt -Continue ASA 81mg  daily -Continue crestor 40mg  daily -Coreg 12.5mg  BID -Continue home imdur -Continue losartan 50mg  daily  #HTN: Bps elevated in 160s/100s. -Continue home coreg -Continue home imdur -Continue losartan 50mg  daily  #Bilateral carotid artery stenosis CT 2015 with occlusion of left ICA at origin. No stroke like symptoms. -Follow with vascular as scheduled -Continue ASA and stain as above  #Chronic Afib: -Well rate controlled -Continue coreg 12.5mg  daily -On heparin gtt for [facility] given plans for possible ACB  #HLD: -Crestor 40mg  -Zetia 10mg   #Obesity: -Consider follow-up with weight loss clinic -Plan for cardiac rehab  For questions or updates, please contact [facility] Please consult [email or web address] for contact info under        Signed, Freada Bergeron, MD  09/14/2020, 11:07 AM

## 2020-09-15 ENCOUNTER — Other Ambulatory Visit: Payer: Self-pay | Admitting: *Deleted

## 2020-09-15 ENCOUNTER — Inpatient Hospital Stay (HOSPITAL_COMMUNITY): Payer: Medicare HMO

## 2020-09-15 ENCOUNTER — Encounter: Payer: Self-pay | Admitting: Cardiovascular Disease

## 2020-09-15 ENCOUNTER — Other Ambulatory Visit: Payer: Self-pay

## 2020-09-15 DIAGNOSIS — I2510 Atherosclerotic heart disease of native coronary artery without angina pectoris: Secondary | ICD-10-CM

## 2020-09-15 DIAGNOSIS — I482 Chronic atrial fibrillation, unspecified: Secondary | ICD-10-CM | POA: Diagnosis not present

## 2020-09-15 DIAGNOSIS — I6523 Occlusion and stenosis of bilateral carotid arteries: Secondary | ICD-10-CM | POA: Diagnosis not present

## 2020-09-15 DIAGNOSIS — I739 Peripheral vascular disease, unspecified: Secondary | ICD-10-CM | POA: Diagnosis not present

## 2020-09-15 LAB — URINALYSIS, ROUTINE W REFLEX MICROSCOPIC
Bilirubin Urine: NEGATIVE
Glucose, UA: NEGATIVE mg/dL
Hgb urine dipstick: NEGATIVE
Ketones, ur: NEGATIVE mg/dL
Leukocytes,Ua: NEGATIVE
Nitrite: NEGATIVE
Protein, ur: NEGATIVE mg/dL
Specific Gravity, Urine: 1.011 (ref 1.005–1.030)
pH: 6 (ref 5.0–8.0)

## 2020-09-15 LAB — BLOOD GAS, ARTERIAL
Acid-Base Excess: 2.3 mmol/L — ABNORMAL HIGH (ref 0.0–2.0)
Bicarbonate: 26.7 mmol/L (ref 20.0–28.0)
Drawn by: 275531
FIO2: 21
O2 Saturation: 93.1 %
Patient temperature: 37
pCO2 arterial: 43.6 mmHg (ref 32.0–48.0)
pH, Arterial: 7.404 (ref 7.350–7.450)
pO2, Arterial: 68.3 mmHg — ABNORMAL LOW (ref 83.0–108.0)

## 2020-09-15 LAB — PULMONARY FUNCTION TEST
FEF 25-75 Pre: 1.18 L/sec
FEF2575-%Pred-Pre: 44 %
FEV1-%Pred-Pre: 55 %
FEV1-Pre: 1.92 L
FEV1FVC-%Pred-Pre: 91 %
FEV6-%Pred-Pre: 63 %
FEV6-Pre: 2.8 L
FEV6FVC-%Pred-Pre: 104 %
FVC-%Pred-Pre: 60 %
FVC-Pre: 2.83 L
Pre FEV1/FVC ratio: 68 %
Pre FEV6/FVC Ratio: 99 %

## 2020-09-15 LAB — CBC
HCT: 44 % (ref 39.0–52.0)
Hemoglobin: 13.9 g/dL (ref 13.0–17.0)
MCH: 31.4 pg (ref 26.0–34.0)
MCHC: 31.6 g/dL (ref 30.0–36.0)
MCV: 99.3 fL (ref 80.0–100.0)
Platelets: 139 10*3/uL — ABNORMAL LOW (ref 150–400)
RBC: 4.43 MIL/uL (ref 4.22–5.81)
RDW: 12.1 % (ref 11.5–15.5)
WBC: 9.2 10*3/uL (ref 4.0–10.5)
nRBC: 0 % (ref 0.0–0.2)

## 2020-09-15 LAB — SURGICAL PCR SCREEN
MRSA, PCR: NEGATIVE
Staphylococcus aureus: NEGATIVE

## 2020-09-15 LAB — HEPARIN LEVEL (UNFRACTIONATED): Heparin Unfractionated: 0.44 IU/mL (ref 0.30–0.70)

## 2020-09-15 MED ORDER — ALPRAZOLAM 0.5 MG PO TABS
0.5000 mg | ORAL_TABLET | Freq: Two times a day (BID) | ORAL | Status: DC | PRN
  Administered 2020-09-15 – 2020-09-17 (×4): 0.5 mg via ORAL
  Filled 2020-09-15 (×4): qty 1

## 2020-09-15 MED ORDER — IOHEXOL 350 MG/ML SOLN
75.0000 mL | Freq: Once | INTRAVENOUS | Status: AC | PRN
  Administered 2020-09-15: 75 mL via INTRAVENOUS

## 2020-09-15 MED ORDER — LOSARTAN POTASSIUM 50 MG PO TABS
50.0000 mg | ORAL_TABLET | Freq: Every day | ORAL | Status: DC
  Administered 2020-09-16 – 2020-09-17 (×2): 50 mg via ORAL
  Filled 2020-09-15 (×2): qty 1

## 2020-09-15 NOTE — Progress Notes (Addendum)
Procedure(s) (LRB): CORONARY ARTERY BYPASS GRAFTING (CABG) (N/A) CLIPPING OF ATRIAL APPENDAGE (N/A) TRANSESOPHAGEAL ECHOCARDIOGRAM (TEE) (N/A) Subjective: Patient examined and images of coronary arteriogram and echocardiogram and chest CT scan personally reviewed. 69 year old male with history of PCI over 10 years ago now admitted with unstable angina.  He has severe in-stent stenosis of the proximal circumflex in a left dominant coronary circulation.  The LAD also has significant proximal disease.  EF is fairly well-preserved  The patient has significant peripheral vascular disease with bilateral carotid artery occlusion at origin of the left and right internal carotid vessels.  No symptoms of TIA or stroke  The patient has chronic atrial fibrillation, longstanding over 10 years on Eliquis.  He is currently on heparin as a bridge for Eliquis washout prior to CABG which we scheduled for Thursday, October 21.  I discussed the procedure of CABG with left atrial clip with the patient and he is in agreement with the plan.  Objective: Vital signs in last 24 hours: Temp:  [97.6 F (36.4 C)-99 F (37.2 C)] 98.4 F (36.9 C) (10/18 1527) Pulse Rate:  [76-97] 82 (10/18 1527) Cardiac Rhythm: Atrial fibrillation (10/17 2000) Resp:  [16-19] 17 (10/18 1527) BP: (104-172)/(68-103) 155/103 (10/18 1527) SpO2:  [98 %-99 %] 99 % (10/18 0626) Weight:  [113.3 kg] 113.3 kg (10/18 1111)  Hemodynamic parameters for last 24 hours:    Intake/Output from previous day: 10/17 0701 - 10/18 0700 In: 1506.7 [P.O.:780; I.V.:726.7] Out: 975 [Urine:975] Intake/Output this shift: Total I/O In: 390 [P.O.:390] Out: -   Exam Lungs clear Irregular heart rhythm No murmur or gallop Chronic venous insufficiency skin changes right greater than left leg Neuro intact  Lab Results: Recent Labs    09/14/20 0150 09/15/20 0324  WBC 8.2 9.2  HGB 14.8 13.9  HCT 45.7 44.0  PLT 149* 139*   BMET:  Recent Labs     09/13/20 0124  NA 138  K 3.8  CL 102  CO2 25  GLUCOSE 96  BUN 13  CREATININE 0.88  CALCIUM 9.4    PT/INR:  Recent Labs    09/13/20 0124  LABPROT 13.5  INR 1.1   ABG    Component Value Date/Time   PHART 7.404 09/15/2020 1002   HCO3 26.7 09/15/2020 1002   O2SAT 93.1 09/15/2020 1002   CBG (last 3)  No results for input(s): GLUCAP in the last 72 hours.  Assessment/Plan: S/P Procedure(s) (LRB): CORONARY ARTERY BYPASS GRAFTING (CABG) (N/A) CLIPPING OF ATRIAL APPENDAGE (N/A) TRANSESOPHAGEAL ECHOCARDIOGRAM (TEE) (N/A) Continue IV heparin for acute coronary syndrome and bridge for surgery while Eliquis washout in progress  Vein mapping has been ordered to assess saphenous vein conduit in the legs.  Pre-CABG Dopplers are pending.   LOS: 3 days    Tharon Aquas Trigt III 09/15/2020

## 2020-09-15 NOTE — Progress Notes (Signed)
[place] for heparin Indication: chest pain/ACS  Allergies  Allergen Reactions  . Propafenone Anxiety  . Codeine Nausea Only  . Penicillins Rash    Patient Measurements: Weight: 113.3 kg (249 lb 12.8 oz) Heparin Dosing Weight: 100kg  Vital Signs: Temp: 97.9 F (36.6 C) (10/18 0626) Temp Source: Oral (10/18 0626) BP: 172/93 (10/18 0626) Pulse Rate: 76 (10/18 0626)  Labs: Recent Labs    09/13/20 0124 09/13/20 0124 09/13/20 0813 09/13/20 0939 09/13/20 0939 09/13/20 1611 09/14/20 0150 09/15/20 0324  HGB 14.1   < >  --   --   --   --  14.8 13.9  HCT 43.9  --   --   --   --   --  45.7 44.0  PLT 158  --   --   --   --   --  149* 139*  APTT 57*   < > 75* 70*  --  64*  --   --   LABPROT 13.5  --   --   --   --   --   --   --   INR 1.1  --   --   --   --   --   --   --   HEPARINUNFRC 0.37   < >  --  0.48   < > 0.46 0.55 0.44  CREATININE 0.88  --   --   --   --   --   --   --    < > = values in this interval not displayed.    Estimated Creatinine Clearance: 102.8 mL/min (by C-G formula based on SCr of 0.88 mg/dL).   Assessment: 11 yoM s/p LHC with mvCAD to restart IV heparin 8h after sheath pull while awaiting CABG evaluation. Sheath pulled ~1000. Pt takes apixaban PTA, last dose unknown.  Heparin level remains therapeutic aat 0.44, CBC stable.  Goal of Therapy:  Heparin level 0.3-0.7 units/ml  Monitor platelets by anticoagulation protocol: Yes   Plan:  Continue heparin gtt at 1400 units/hr Daily heparin level, CBC, s/s bleeding  Hildred Laser, PharmD Clinical Pharmacist **Pharmacist phone directory can now be found on [email or web address] ([email or web address]).  Listed under [place].

## 2020-09-15 NOTE — Progress Notes (Addendum)
The patient has been seen in conjunction with Vin Bhagat, PAC. All aspects of care have been considered and discussed. The patient has been personally interviewed, examined, and all clinical data has been reviewed.   Class III-IV angina despite up titration of medical therapy, due to ostial to proximal circumflex ISR.  Moderate LAD disease.  Awaiting CABG for ISR of ostial circumflex.  Moderate ostial proximal LAD disease.  Has documented bilateral carotid occlusion, chronic atrial fibrillation on anticoagulation therapy chronically, PAD, and chronic diastolic heart failure.   Progress Note  Patient Name: Jeremy Reese Date of Encounter: 09/15/2020  [facility] Cardiologist: Ida Rogue, MD   Subjective   Feeling well. No chest pain, sob or palpitations.   Inpatient Medications    Scheduled Meds: . aspirin EC  81 mg Oral Daily  . carvedilol  12.5 mg Oral BID WC  . docusate sodium  100 mg Oral QHS  . ezetimibe  10 mg Oral Daily  . gabapentin  400 mg Oral TID  . influenza vaccine adjuvanted  0.5 mL Intramuscular Tomorrow-1000  . isosorbide mononitrate  60 mg Oral Daily  . losartan  25 mg Oral Daily  . rosuvastatin  40 mg Oral Daily   Continuous Infusions: . heparin 1,400 Units/hr (09/15/20 0049)   PRN Meds: acetaminophen, ALPRAZolam, furosemide, nitroGLYCERIN, ondansetron (ZOFRAN) IV, potassium chloride, traMADol   Vital Signs    Vitals:   09/14/20 1459 09/14/20 1635 09/14/20 2010 09/15/20 0626  BP: 99/77 (!) 140/104 104/68 (!) 172/93  Pulse: 87  94 76  Resp: 18  16 19   Temp: 98.7 F (37.1 C)  97.6 F (36.4 C) 97.9 F (36.6 C)  TempSrc: Oral  Oral Oral  SpO2: 97%  98% 99%  Weight:    113.3 kg    Intake/Output Summary (Last 24 hours) at 09/15/2020 0818 Last data filed at 09/15/2020 0650 Gross per 24 hour  Intake 1506.7 ml  Output 975 ml  Net 531.7 ml   Last 3 Weights 09/15/2020 09/14/2020 09/13/2020  Weight (lbs) 249 lb 12.8 oz 248 lb 14.4  oz 249 lb 3.2 oz  Weight (kg) 113.309 kg 112.9 kg 113.036 kg      Telemetry    Atrial fibrillation with intermittent pause, longest 2.2 sec - Personally Reviewed  ECG    N/a  Physical Exam   GEN: No acute distress.   Neck: No JVD Cardiac: Ir IR , no murmurs, rubs, or gallops.  Respiratory: Clear to auscultation bilaterally. GI: Soft, nontender, non-distended  MS: No edema; No deformity. Neuro:  Nonfocal  Psych: Normal affect   Labs     Chemistry Recent Labs  Lab 09/13/20 0124  NA 138  K 3.8  CL 102  CO2 25  GLUCOSE 96  BUN 13  CREATININE 0.88  CALCIUM 9.4  GFRNONAA >60  ANIONGAP 11     Hematology Recent Labs  Lab 09/13/20 0124 09/14/20 0150 09/15/20 0324  WBC 8.0 8.2 9.2  RBC 4.49 4.72 4.43  HGB 14.1 14.8 13.9  HCT 43.9 45.7 44.0  MCV 97.8 96.8 99.3  MCH 31.4 31.4 31.4  MCHC 32.1 32.4 31.6  RDW 11.9 12.1 12.1  PLT 158 149* 139*     Radiology    ECHOCARDIOGRAM COMPLETE  Result Date: 09/13/2020    ECHOCARDIOGRAM REPORT   Patient Name:   Jeremy Reese Date of Exam: 09/13/2020 Medical Rec #:  [id number]         Height:  71.0 in Accession #:    [id number]        Weight:       249.2 lb Date of Birth:  [birth year]         BSA:          2.315 m Patient Age:    69 years          BP:           134/90 mmHg Patient Gender: M                 HR:           79 bpm. Exam Location:  Inpatient Procedure: 2D Echo, Cardiac Doppler, Color Doppler and Intracardiac            Opacification Agent Indications:    CAD Native Vessel 414.01 / I25.10  History:        Patient has no prior history of Echocardiogram examinations.                 CAD, TIA, Arrythmias:Atrial Fibrillation and Atrial Flutter;                 Risk Factors:Dyslipidemia.  Sonographer:    Bernadene Person RDCS Referring Phys: [id number] [place]  1. Left ventricular ejection fraction, by estimation, is 60 to 65%. The left ventricle has normal function. The left ventricle has no  regional wall motion abnormalities. There is moderate left ventricular hypertrophy. Left ventricular diastolic parameters are indeterminate.  2. Right ventricular systolic function is normal. The right ventricular size is normal.  3. Left atrial size was severely dilated.  4. Right atrial size was mildly dilated.  5. The mitral valve is grossly normal. Mild mitral valve regurgitation. No evidence of mitral stenosis. Moderate mitral annular calcification.  6. The aortic valve is abnormal. There is severe calcifcation of the aortic valve. Aortic valve regurgitation is not visualized. Aortic valve sclerosis/calcification is present, without any evidence of aortic stenosis. Mean systolic gradient 9 mmHg. FINDINGS  Left Ventricle: Left ventricular ejection fraction, by estimation, is 60 to 65%. The left ventricle has normal function. The left ventricle has no regional wall motion abnormalities. Definity contrast agent was given IV to delineate the left ventricular  endocardial borders. The left ventricular internal cavity size was normal in size. There is moderate left ventricular hypertrophy. Left ventricular diastolic parameters are indeterminate. Right Ventricle: The right ventricular size is normal. No increase in right ventricular wall thickness. Right ventricular systolic function is normal. Left Atrium: Left atrial size was severely dilated. Right Atrium: Right atrial size was mildly dilated. Pericardium: There is no evidence of pericardial effusion. Mitral Valve: The mitral valve is grossly normal. Moderate mitral annular calcification. Mild mitral valve regurgitation. No evidence of mitral valve stenosis. Tricuspid Valve: The tricuspid valve is normal in structure. Tricuspid valve regurgitation is trivial. No evidence of tricuspid stenosis. Aortic Valve: The aortic valve is abnormal. There is severe calcifcation of the aortic valve. Aortic valve regurgitation is not visualized. Aortic valve  sclerosis/calcification is present, without any evidence of aortic stenosis. Aortic valve mean gradient measures 9.0 mmHg. Aortic valve peak gradient measures 14.9 mmHg. Aortic valve area, by VTI measures 1.79 cm. Pulmonic Valve: The pulmonic valve was normal in structure. Pulmonic valve regurgitation is trivial. No evidence of pulmonic stenosis. Aorta: The aortic root is normal in size and structure. Venous: The inferior vena cava was not well visualized. IAS/Shunts: The interatrial septum was not well visualized.  LEFT VENTRICLE PLAX 2D LVIDd:         5.20 cm LVIDs:         3.60 cm LV PW:         1.35 cm LV IVS:        1.37 cm LVOT diam:     2.20 cm LV SV:         69 LV SV Index:   30 LVOT Area:     3.80 cm  RIGHT VENTRICLE TAPSE (M-mode): 1.6 cm LEFT ATRIUM              Index       RIGHT ATRIUM           Index LA diam:        5.90 cm  2.55 cm/m  RA Area:     19.10 cm LA Vol (A2C):   149.0 ml 64.36 ml/m RA Volume:   50.10 ml  21.64 ml/m LA Vol (A4C):   175.0 ml 75.59 ml/m LA Biplane Vol: 165.0 ml 71.27 ml/m  AORTIC VALVE AV Area (Vmax):    1.83 cm AV Area (Vmean):   1.86 cm AV Area (VTI):     1.79 cm AV Vmax:           193.00 cm/s AV Vmean:          139.000 cm/s AV VTI:            0.383 m AV Peak Grad:      14.9 mmHg AV Mean Grad:      9.0 mmHg LVOT Vmax:         92.78 cm/s LVOT Vmean:        68.050 cm/s LVOT VTI:          0.180 m LVOT/AV VTI ratio: 0.47  AORTA Ao Root diam: 3.50 cm Ao Asc diam:  2.80 cm TRICUSPID VALVE TR Peak grad:   28.5 mmHg TR Vmax:        267.00 cm/s  SHUNTS Systemic VTI:  0.18 m Systemic Diam: 2.20 cm Cherlynn Kaiser MD Electronically signed by Cherlynn Kaiser MD Signature Date/Time: 09/13/2020/4:13:32 PM    Final     Cardiac Studies   Echo 09/13/2020 1. Left ventricular ejection fraction, by estimation, is 60 to 65%. The  left ventricle has normal function. The left ventricle has no regional  wall motion abnormalities. There is moderate left ventricular hypertrophy.   Left ventricular diastolic  parameters are indeterminate.  2. Right ventricular systolic function is normal. The right ventricular  size is normal.  3. Left atrial size was severely dilated.  4. Right atrial size was mildly dilated.  5. The mitral valve is grossly normal. Mild mitral valve regurgitation.  No evidence of mitral stenosis. Moderate mitral annular calcification.  6. The aortic valve is abnormal. There is severe calcifcation of the  aortic valve. Aortic valve regurgitation is not visualized. Aortic valve  sclerosis/calcification is present, without any evidence of aortic  stenosis. Mean systolic gradient 9 mmHg.   LEFT HEART CATH AND CORONARY ANGIOGRAPHY 09/12/2020  Conclusion   Ost Cx to Prox Cx lesion is 95% stenosed.  Ost LAD to Prox LAD lesion is 70% stenosed.  Mid LAD lesion is 60% stenosed.  Diagnostic Dominance: Right   Patient Profile     69 y.o. male with a history of CAD s/p PCI LCx 2010, TIA (2012), bilateral occlusion of ICAmonitored by Dr. Lucky Cowboy, permanent atrial fibrillation, HLD,chronic back painpresented with chest pain found to  have multivessel CAD on cath who transferred from [facility] for surgery evaluation.   Assessment & Plan    1. Multivessel CAD - LHC with 70% ostial LAD, 60% mid LAD, ostial LCx 95%, RCA without significant disease. Seen by Dr. Kipp Brood, felt good candidate for CABG + Atrial clip however patient requested to be seen by Dr. Prescott Gum (pending evaluation).  - continue medical therapy  2. Permanent atrial fibrillation  - Rate relatively stable with intermittent pause - Continue Coreg at current dose - on heparin for anticoagulation - Possible atrial clip during surgery  3. Carotid artery disease - CT of neck in 2015: Occlusion of both internal carotid arteries at their origins with  reconstitution intracranially via ECA collaterals and a patent left  posterior communicating artery.  - Continue ASA and statin   4.  HTN - Intermittently elevated - Continue current medical therapy  For questions or updates, please contact [place] Please consult [email or web address] for contact info under        SignedLeanor Kail, PA  09/15/2020, 8:18 AM

## 2020-09-16 ENCOUNTER — Inpatient Hospital Stay (HOSPITAL_COMMUNITY): Payer: Medicare HMO

## 2020-09-16 ENCOUNTER — Encounter (HOSPITAL_COMMUNITY): Payer: Medicare HMO

## 2020-09-16 ENCOUNTER — Other Ambulatory Visit (HOSPITAL_COMMUNITY): Payer: Medicare HMO

## 2020-09-16 DIAGNOSIS — I208 Other forms of angina pectoris: Secondary | ICD-10-CM

## 2020-09-16 DIAGNOSIS — Z01810 Encounter for preprocedural cardiovascular examination: Secondary | ICD-10-CM

## 2020-09-16 DIAGNOSIS — I2510 Atherosclerotic heart disease of native coronary artery without angina pectoris: Secondary | ICD-10-CM | POA: Diagnosis not present

## 2020-09-16 DIAGNOSIS — R079 Chest pain, unspecified: Secondary | ICD-10-CM

## 2020-09-16 DIAGNOSIS — I482 Chronic atrial fibrillation, unspecified: Secondary | ICD-10-CM | POA: Diagnosis not present

## 2020-09-16 LAB — BASIC METABOLIC PANEL
Anion gap: 10 (ref 5–15)
BUN: 16 mg/dL (ref 8–23)
CO2: 24 mmol/L (ref 22–32)
Calcium: 9.5 mg/dL (ref 8.9–10.3)
Chloride: 103 mmol/L (ref 98–111)
Creatinine, Ser: 0.97 mg/dL (ref 0.61–1.24)
GFR, Estimated: >60 mL/min (ref >60)
Glucose, Bld: 114 mg/dL — ABNORMAL HIGH (ref 70–99)
Potassium: 4.1 mmol/L (ref 3.5–5.1)
Sodium: 137 mmol/L (ref 135–145)

## 2020-09-16 LAB — CBC
HCT: 45.9 % (ref 39.0–52.0)
Hemoglobin: 15 g/dL (ref 13.0–17.0)
MCH: 31.8 pg (ref 26.0–34.0)
MCHC: 32.7 g/dL (ref 30.0–36.0)
MCV: 97.2 fL (ref 80.0–100.0)
Platelets: 149 10*3/uL — ABNORMAL LOW (ref 150–400)
RBC: 4.72 MIL/uL (ref 4.22–5.81)
RDW: 12.2 % (ref 11.5–15.5)
WBC: 7 10*3/uL (ref 4.0–10.5)
nRBC: 0 % (ref 0.0–0.2)

## 2020-09-16 LAB — PROTIME-INR
INR: 1 (ref 0.8–1.2)
Prothrombin Time: 13.1 seconds (ref 11.4–15.2)

## 2020-09-16 LAB — TSH: TSH: 2.608 u[IU]/mL (ref 0.350–4.500)

## 2020-09-16 LAB — HEPATIC FUNCTION PANEL
ALT: 26 U/L (ref 0–44)
AST: 24 U/L (ref 15–41)
Albumin: 3.9 g/dL (ref 3.5–5.0)
Alkaline Phosphatase: 52 U/L (ref 38–126)
Bilirubin, Direct: 0.1 mg/dL (ref 0.0–0.2)
Indirect Bilirubin: 0.8 mg/dL (ref 0.3–0.9)
Total Bilirubin: 0.9 mg/dL (ref 0.3–1.2)
Total Protein: 6.7 g/dL (ref 6.5–8.1)

## 2020-09-16 LAB — HEPARIN LEVEL (UNFRACTIONATED): Heparin Unfractionated: 0.43 IU/mL (ref 0.30–0.70)

## 2020-09-16 NOTE — Progress Notes (Signed)
Pre-CABG Dopplers and lower extremity vein mapping completed. Refer to "CV Proc" under chart review to view preliminary results.  09/16/2020 3:51 PM Kelby Aline., MHA, RVT, RDCS, RDMS

## 2020-09-16 NOTE — Progress Notes (Signed)
Simultaneous blood pressures taken in bilateral upper extremities.  Blood pressure in right arm 170/89 and blood pressure in left arm 175/110.

## 2020-09-16 NOTE — Progress Notes (Addendum)
The patient has been seen in conjunction with Vin Bhagat, PAC. All aspects of care have been considered and discussed. The patient has been personally interviewed, examined, and all clinical data has been reviewed.   He denies chest discomfort/pressure.  Plan is for coronary bypass grafting tomorrow.   Progress Note  Patient Name: Jeremy Reese Date of Encounter: 09/16/2020  [facility] Cardiologist: Ida Rogue, MD   Subjective   Feeling well. No chest pain, sob or palpitations.   Inpatient Medications    Scheduled Meds: . aspirin EC  81 mg Oral Daily  . carvedilol  12.5 mg Oral BID WC  . docusate sodium  100 mg Oral QHS  . ezetimibe  10 mg Oral Daily  . gabapentin  400 mg Oral TID  . influenza vaccine adjuvanted  0.5 mL Intramuscular Tomorrow-1000  . isosorbide mononitrate  60 mg Oral Daily  . losartan  50 mg Oral Daily  . rosuvastatin  40 mg Oral Daily   Continuous Infusions: . heparin 1,400 Units/hr (09/15/20 1724)   PRN Meds: acetaminophen, ALPRAZolam, furosemide, nitroGLYCERIN, ondansetron (ZOFRAN) IV, potassium chloride, traMADol   Vital Signs    Vitals:   09/15/20 1527 09/15/20 1937 09/16/20 0500 09/16/20 0739  BP: (!) 155/103 102/67 (!) 154/91 (!) 159/87  Pulse: 82 78 65 92  Resp: 17 19 18 16   Temp: 98.4 F (36.9 C) (!) 97.5 F (36.4 C) 98 F (36.7 C) 98.4 F (36.9 C)  TempSrc: Oral Oral Oral Oral  SpO2:  95% 97% 99%  Weight:   113.7 kg   Height:        Intake/Output Summary (Last 24 hours) at 09/16/2020 0757 Last data filed at 09/16/2020 0500 Gross per 24 hour  Intake 687 ml  Output 725 ml  Net -38 ml   Last 3 Weights 09/16/2020 09/15/2020 09/15/2020  Weight (lbs) 250 lb 11.2 oz 249 lb 12.5 oz 249 lb 12.8 oz  Weight (kg) 113.717 kg 113.3 kg 113.309 kg      Telemetry    Atrial fibrillation at controlled rate- Personally Reviewed  ECG    N/A  Physical Exam   GEN: No acute distress.   Neck: No JVD Cardiac: Irregular,  no murmurs, rubs, or gallops.  Respiratory: Clear to auscultation bilaterally. GI: Soft, nontender, non-distended  MS: No edema; No deformity. Neuro:  Nonfocal  Psych: Normal affect   Labs   Chemistry Recent Labs  Lab 09/13/20 0124 09/16/20 0603  NA 138 137  K 3.8 4.1  CL 102 103  CO2 25 24  GLUCOSE 96 114*  BUN 13 16  CREATININE 0.88 0.97  CALCIUM 9.4 9.5  PROT  --  6.7  ALBUMIN  --  3.9  AST  --  24  ALT  --  26  ALKPHOS  --  52  BILITOT  --  0.9  GFRNONAA >60 >60  ANIONGAP 11 10     Hematology Recent Labs  Lab 09/14/20 0150 09/15/20 0324 09/16/20 0603  WBC 8.2 9.2 7.0  RBC 4.72 4.43 4.72  HGB 14.8 13.9 15.0  HCT 45.7 44.0 45.9  MCV 96.8 99.3 97.2  MCH 31.4 31.4 31.8  MCHC 32.4 31.6 32.7  RDW 12.1 12.1 12.2  PLT 149* 139* 149*    Radiology    DG Chest 2 View  Result Date: 09/16/2020 CLINICAL DATA:  Chest pain. EXAM: CHEST - 2 VIEW COMPARISON:  Chest CT 09/15/2020.  Chest x-ray 08/10/2010. FINDINGS: Mediastinum and hilar structures normal. Heart size  stable. No pulmonary venous congestion. Low lung volumes. Very mild bilateral interstitial prominence cannot be excluded. Mild pneumonitis/interstitial edema cannot be excluded. No pleural effusion or pneumothorax. Diffuse thoracic spine osteopenia degenerative change. Kyphosis of the thoracic spine noted. IMPRESSION: Very mild bilateral interstitial prominence cannot be excluded. Mild pneumonitis/interstitial edema cannot be excluded. Electronically Signed   By: Marcello Moores  Register   On: 09/16/2020 06:30   CT ANGIO CHEST AORTA W/CM & OR WO/CM  Result Date: 09/15/2020 CLINICAL DATA:  Coronary artery disease. Preoperative planning prior to CABG EXAM: CT ANGIOGRAPHY CHEST WITH CONTRAST TECHNIQUE: Multidetector CT imaging of the chest was performed using the standard protocol during bolus administration of intravenous contrast. Multiplanar CT image reconstructions and MIPs were obtained to evaluate the vascular  anatomy. CONTRAST:  85mL OMNIPAQUE IOHEXOL 350 MG/ML SOLN COMPARISON:  Chest x-ray 08/10/2010 FINDINGS: Cardiovascular: There is moderate dilation of left atrium and mild dilation of the right atrium. Overall heart size is upper limits of normal. No pericardial effusion. Thoracic aorta is normal in course and caliber without aneurysm or dissection. Three vessel arch without evidence of stenosis. There are scattered atherosclerotic calcifications involving the aorta, aortic valve, branch vessels, and coronary arteries. Central pulmonary vasculature is well opacified without filling defect to suggest pulmonary embolism. Mediastinum/Nodes: No enlarged mediastinal, hilar, or axillary lymph nodes. Thyroid gland, trachea, and esophagus demonstrate no significant findings. Lungs/Pleura: Lungs are clear. No pleural effusion or pneumothorax. Upper Abdomen: Reflux of contrast into the IVC and hepatic veins. No acute findings within the visualized upper abdomen. Musculoskeletal: No chest wall abnormality. No acute or significant osseous findings. Review of the MIP images confirms the above findings. IMPRESSION: 1. Negative for thoracic aortic aneurysm or dissection. 2. Findings suggestive of right heart dysfunction. 3. Aortic and coronary artery atherosclerosis. (ICD10-I70.0). 4. Lungs are clear. Electronically Signed   By: Davina Poke D.O.   On: 09/15/2020 16:42    Cardiac Studies   Echo 09/13/2020 1. Left ventricular ejection fraction, by estimation, is 60 to 65%. The  left ventricle has normal function. The left ventricle has no regional  wall motion abnormalities. There is moderate left ventricular hypertrophy.  Left ventricular diastolic  parameters are indeterminate.  2. Right ventricular systolic function is normal. The right ventricular  size is normal.  3. Left atrial size was severely dilated.  4. Right atrial size was mildly dilated.  5. The mitral valve is grossly normal. Mild mitral valve  regurgitation.  No evidence of mitral stenosis. Moderate mitral annular calcification.  6. The aortic valve is abnormal. There is severe calcifcation of the  aortic valve. Aortic valve regurgitation is not visualized. Aortic valve  sclerosis/calcification is present, without any evidence of aortic  stenosis. Mean systolic gradient 9 mmHg.   LEFT HEART CATH AND CORONARY ANGIOGRAPHY 09/12/2020  Conclusion   Ost Cx to Prox Cx lesion is 95% stenosed.  Ost LAD to Prox LAD lesion is 70% stenosed.  Mid LAD lesion is 60% stenosed.  Diagnostic Dominance: Right     Patient Profile     69 y.o. male with a history of CAD s/p PCI LCx 2010, TIA (2012), bilateral occlusion of ICAmonitored by Dr. Lucky Cowboy, permanent atrial fibrillation, HLD,chronic back painpresented with chest pain found to have multivessel CAD on cath who transferred from [facility] for surgery evaluation.   Assessment & Plan    1. Multivessel CAD - LHC with 70% ostial LAD, 60% mid LAD, ostial LCx 95%, RCA without significant disease. Seen by surgery and plan for  CABG + Atrial clip 10/21. - continue medical therapy  2. Permanent atrial fibrillation  - Rate relatively stable with intermittent pause - Continue Coreg at current dose - on heparin for anticoagulation. Last dose of Eliquis 10/13. - for Atrial clip during surgery  3. Carotid artery disease - pending doppler this admission  - Continue ASA and statin   4. HTN - Intermittently elevated - Continue Coreg 12.5mg  BID - Increase losartan to 50mg  qd  5. HLD - 09/05/2020: Cholesterol, Total 108; HDL 44; LDL Chol Calc (NIH) 48; Triglycerides 76  - Continue Crestor and Zetia For questions or updates, please contact [place] Please consult [email or web address] for contact info under        SignedLeanor Kail, PA  09/16/2020, 7:57 AM

## 2020-09-16 NOTE — Progress Notes (Signed)
[place] for heparin Indication: chest pain/ACS  Allergies  Allergen Reactions  . Propafenone Anxiety  . Codeine Nausea Only  . Penicillins Rash    Patient Measurements: Height: 5\' 11"  (180.3 cm) Weight: 113.7 kg (250 lb 11.2 oz) IBW/kg (Calculated) : 75.3 Heparin Dosing Weight: 100kg  Vital Signs: Temp: 98.4 F (36.9 C) (10/19 0739) Temp Source: Oral (10/19 0739) BP: 159/87 (10/19 0739) Pulse Rate: 92 (10/19 0739)  Labs: Recent Labs    09/13/20 0813 09/13/20 5537 09/13/20 0939 09/13/20 1611 09/13/20 1611 09/14/20 0150 09/14/20 0150 09/15/20 0324 09/16/20 0603  HGB  --   --   --   --   --  14.8   < > 13.9 15.0  HCT  --   --   --   --   --  45.7  --  44.0 45.9  PLT  --   --   --   --   --  149*  --  139* 149*  APTT 75* 70*  --  64*  --   --   --   --   --   LABPROT  --   --   --   --   --   --   --   --  13.1  INR  --   --   --   --   --   --   --   --  1.0  HEPARINUNFRC  --  0.48   < > 0.46   < > 0.55  --  0.44 0.43  CREATININE  --   --   --   --   --   --   --   --  0.97   < > = values in this interval not displayed.    Estimated Creatinine Clearance: 93.5 mL/min (by C-G formula based on SCr of 0.97 mg/dL).   Assessment: 64 yoM s/p LHC with mvCAD to restart IV heparin 8h after sheath pull while awaiting CABG evaluation. Sheath pulled ~1000. Pt takes apixaban PTA, last dose was taken 10/13. CABG planned 10/21  Heparin level remains therapeutic aat 0.43, CBC stable.  Goal of Therapy:  Heparin level 0.3-0.7 units/ml  Monitor platelets by anticoagulation protocol: Yes   Plan:  Continue heparin gtt at 1400 units/hr Daily heparin level, CBC, s/s bleeding  Hildred Laser, PharmD Clinical Pharmacist **Pharmacist phone directory can now be found on [email or web address] ([email or web address]).  Listed under [place].

## 2020-09-16 NOTE — Progress Notes (Signed)
Procedure(s) (LRB): CORONARY ARTERY BYPASS GRAFTING (CABG) (N/A) CLIPPING OF ATRIAL APPENDAGE (N/A) TRANSESOPHAGEAL ECHOCARDIOGRAM (TEE) (N/A) Subjective: Stable on iv heparin Vein mapping- saph vein adequate in R leg PFTs - adequate for sternotomy Dopplers- bilateral internal carotid artery chronic occlusion,      Subclavian flow normal each arm Objective: Vital signs in last 24 hours: Temp:  [97.5 F (36.4 C)-98.4 F (36.9 C)] 98.3 F (36.8 C) (10/19 1549) Pulse Rate:  [64-92] 80 (10/19 1549) Cardiac Rhythm: Atrial fibrillation (10/19 0815) Resp:  [16-19] 19 (10/19 1549) BP: (102-159)/(60-91) 130/60 (10/19 1549) SpO2:  [95 %-99 %] 98 % (10/19 1549) Weight:  [113.7 kg] 113.7 kg (10/19 0500)  Hemodynamic parameters for last 24 hours:    Intake/Output from previous day: 10/18 0701 - 10/19 0700 In: 687 [P.O.:687] Out: 725 [Urine:725] Intake/Output this shift: No intake/output data recorded.    Lab Results: Recent Labs    09/15/20 0324 09/16/20 0603  WBC 9.2 7.0  HGB 13.9 15.0  HCT 44.0 45.9  PLT 139* 149*   BMET:  Recent Labs    09/16/20 0603  NA 137  K 4.1  CL 103  CO2 24  GLUCOSE 114*  BUN 16  CREATININE 0.97  CALCIUM 9.5    PT/INR:  Recent Labs    09/16/20 0603  LABPROT 13.1  INR 1.0   ABG    Component Value Date/Time   PHART 7.404 09/15/2020 1002   HCO3 26.7 09/15/2020 1002   O2SAT 93.1 09/15/2020 1002   CBG (last 3)  No results for input(s): GLUCAP in the last 72 hours.  Assessment/Plan: S/P Procedure(s) (LRB): CORONARY ARTERY BYPASS GRAFTING (CABG) (N/A) CLIPPING OF ATRIAL APPENDAGE (N/A) TRANSESOPHAGEAL ECHOCARDIOGRAM (TEE) (N/A)  plan Muti vessel CABG  With Left atrial clip Thursday am   LOS: 4 days    Tharon Aquas Trigt III 09/16/2020

## 2020-09-17 DIAGNOSIS — I6523 Occlusion and stenosis of bilateral carotid arteries: Secondary | ICD-10-CM | POA: Diagnosis not present

## 2020-09-17 DIAGNOSIS — I5032 Chronic diastolic (congestive) heart failure: Secondary | ICD-10-CM | POA: Diagnosis not present

## 2020-09-17 DIAGNOSIS — E782 Mixed hyperlipidemia: Secondary | ICD-10-CM | POA: Diagnosis not present

## 2020-09-17 DIAGNOSIS — I2510 Atherosclerotic heart disease of native coronary artery without angina pectoris: Secondary | ICD-10-CM | POA: Diagnosis not present

## 2020-09-17 LAB — HEMOGLOBIN A1C
Hgb A1c MFr Bld: 6.4 % — ABNORMAL HIGH (ref 4.8–5.6)
Mean Plasma Glucose: 137 mg/dL

## 2020-09-17 LAB — HEPARIN LEVEL (UNFRACTIONATED): Heparin Unfractionated: 0.55 IU/mL (ref 0.30–0.70)

## 2020-09-17 LAB — ABO/RH: ABO/RH(D): O NEG

## 2020-09-17 LAB — PREPARE RBC (CROSSMATCH)

## 2020-09-17 MED ORDER — SODIUM CHLORIDE 0.9 % IV SOLN
1.5000 g | INTRAVENOUS | Status: AC
  Administered 2020-09-18: 1.5 g via INTRAVENOUS
  Filled 2020-09-17: qty 1.5

## 2020-09-17 MED ORDER — DEXMEDETOMIDINE HCL IN NACL 400 MCG/100ML IV SOLN
0.1000 ug/kg/h | INTRAVENOUS | Status: AC
  Administered 2020-09-18: .5 ug/kg/h via INTRAVENOUS
  Filled 2020-09-17: qty 100

## 2020-09-17 MED ORDER — CHLORHEXIDINE GLUCONATE 0.12 % MT SOLN
15.0000 mL | Freq: Once | OROMUCOSAL | Status: AC
  Administered 2020-09-18: 15 mL via OROMUCOSAL
  Filled 2020-09-17: qty 15

## 2020-09-17 MED ORDER — EPINEPHRINE HCL 5 MG/250ML IV SOLN IN NS
0.0000 ug/min | INTRAVENOUS | Status: DC
  Filled 2020-09-17: qty 250

## 2020-09-17 MED ORDER — BISACODYL 5 MG PO TBEC
5.0000 mg | DELAYED_RELEASE_TABLET | Freq: Once | ORAL | Status: AC
  Administered 2020-09-17: 5 mg via ORAL
  Filled 2020-09-17: qty 1

## 2020-09-17 MED ORDER — TRANEXAMIC ACID (OHS) PUMP PRIME SOLUTION
2.0000 mg/kg | INTRAVENOUS | Status: DC
  Filled 2020-09-17: qty 2.29

## 2020-09-17 MED ORDER — TRANEXAMIC ACID (OHS) BOLUS VIA INFUSION
15.0000 mg/kg | INTRAVENOUS | Status: AC
  Administered 2020-09-18: 1716 mg via INTRAVENOUS
  Filled 2020-09-17: qty 1716

## 2020-09-17 MED ORDER — MILRINONE LACTATE IN DEXTROSE 20-5 MG/100ML-% IV SOLN
0.3000 ug/kg/min | INTRAVENOUS | Status: AC
  Administered 2020-09-18: .25 ug/kg/min via INTRAVENOUS
  Filled 2020-09-17: qty 100

## 2020-09-17 MED ORDER — METOPROLOL TARTRATE 12.5 MG HALF TABLET
12.5000 mg | ORAL_TABLET | Freq: Once | ORAL | Status: AC
  Administered 2020-09-18: 12.5 mg via ORAL
  Filled 2020-09-17: qty 1

## 2020-09-17 MED ORDER — INSULIN REGULAR(HUMAN) IN NACL 100-0.9 UT/100ML-% IV SOLN
INTRAVENOUS | Status: AC
  Administered 2020-09-18: .8 [IU]/h via INTRAVENOUS
  Filled 2020-09-17: qty 100

## 2020-09-17 MED ORDER — NOREPINEPHRINE 4 MG/250ML-% IV SOLN
0.0000 ug/min | INTRAVENOUS | Status: DC
  Filled 2020-09-17: qty 250

## 2020-09-17 MED ORDER — PLASMA-LYTE 148 IV SOLN
INTRAVENOUS | Status: DC
  Filled 2020-09-17 (×2): qty 2.5

## 2020-09-17 MED ORDER — PHENYLEPHRINE HCL-NACL 20-0.9 MG/250ML-% IV SOLN
30.0000 ug/min | INTRAVENOUS | Status: AC
  Administered 2020-09-18: 20 ug/min via INTRAVENOUS
  Filled 2020-09-17: qty 250

## 2020-09-17 MED ORDER — TEMAZEPAM 15 MG PO CAPS: 15.0000 mg | ORAL_CAPSULE | Freq: Once | ORAL | Status: DC | PRN

## 2020-09-17 MED ORDER — CHLORHEXIDINE GLUCONATE 4 % EX LIQD
60.0000 mL | Freq: Once | CUTANEOUS | Status: AC
  Administered 2020-09-17: 4 via TOPICAL
  Filled 2020-09-17: qty 60

## 2020-09-17 MED ORDER — MAGNESIUM SULFATE 50 % IJ SOLN
40.0000 meq | INTRAMUSCULAR | Status: DC
  Filled 2020-09-17: qty 9.85

## 2020-09-17 MED ORDER — DIAZEPAM 5 MG PO TABS
5.0000 mg | ORAL_TABLET | Freq: Once | ORAL | Status: AC
  Administered 2020-09-18: 5 mg via ORAL
  Filled 2020-09-17: qty 1

## 2020-09-17 MED ORDER — SODIUM CHLORIDE 0.9 % IV SOLN
INTRAVENOUS | Status: DC
  Filled 2020-09-17: qty 30

## 2020-09-17 MED ORDER — SODIUM CHLORIDE 0.9 % IV SOLN
750.0000 mg | INTRAVENOUS | Status: AC
  Administered 2020-09-18: 750 mg via INTRAVENOUS
  Filled 2020-09-17: qty 750

## 2020-09-17 MED ORDER — NITROGLYCERIN IN D5W 200-5 MCG/ML-% IV SOLN
2.0000 ug/min | INTRAVENOUS | Status: AC
  Administered 2020-09-18: 5 ug/min via INTRAVENOUS
  Filled 2020-09-17: qty 250

## 2020-09-17 MED ORDER — CHLORHEXIDINE GLUCONATE 4 % EX LIQD
60.0000 mL | Freq: Once | CUTANEOUS | Status: AC
  Administered 2020-09-18: 4 via TOPICAL
  Filled 2020-09-17: qty 60

## 2020-09-17 MED ORDER — VANCOMYCIN HCL 1500 MG/300ML IV SOLN
1500.0000 mg | INTRAVENOUS | Status: AC
  Administered 2020-09-18: 1500 mg via INTRAVENOUS
  Filled 2020-09-17: qty 300

## 2020-09-17 MED ORDER — POTASSIUM CHLORIDE 2 MEQ/ML IV SOLN
80.0000 meq | INTRAVENOUS | Status: DC
  Filled 2020-09-17: qty 40

## 2020-09-17 MED ORDER — TRANEXAMIC ACID 1000 MG/10ML IV SOLN
1.5000 mg/kg/h | INTRAVENOUS | Status: AC
  Administered 2020-09-18: 1.5 mg/kg/h via INTRAVENOUS
  Filled 2020-09-17: qty 25

## 2020-09-17 NOTE — Care Management Important Message (Signed)
Important Message  Patient Details  Name: Jeremy Reese MRN: [id number] Date of Birth: [birth year]   Medicare Important Message Given:  Yes     Shelda Altes 09/17/2020, 10:27 AM

## 2020-09-17 NOTE — Progress Notes (Addendum)
The patient has been seen in conjunction with Reino Bellis, NP. All aspects of care have been considered and discussed. The patient has been personally interviewed, examined, and all clinical data has been reviewed.   Chronic atrial fibrillation with good rate control.  He is on IV heparin.  No angina.  Awaiting CABG tomorrow a.m.   Progress Note  Patient Name: Jeremy Reese Date of Encounter: 09/17/2020  [facility] Cardiologist: Ida Rogue, MD   Subjective   Feeling well this morning.   Inpatient Medications    Scheduled Meds: . aspirin EC  81 mg Oral Daily  . carvedilol  12.5 mg Oral BID WC  . docusate sodium  100 mg Oral QHS  . [START ON 09/18/2020] epinephrine  0-10 mcg/min Intravenous To OR  . ezetimibe  10 mg Oral Daily  . gabapentin  400 mg Oral TID  . [START ON 09/18/2020] heparin-papaverine-plasmalyte irrigation   Irrigation To OR  . influenza vaccine adjuvanted  0.5 mL Intramuscular Tomorrow-1000  . [START ON 09/18/2020] insulin   Intravenous To OR  . isosorbide mononitrate  60 mg Oral Daily  . losartan  50 mg Oral Daily  . [START ON 09/18/2020] magnesium sulfate  40 mEq Other To OR  . [START ON 09/18/2020] phenylephrine  30-200 mcg/min Intravenous To OR  . [START ON 09/18/2020] potassium chloride  80 mEq Other To OR  . rosuvastatin  40 mg Oral Daily  . [START ON 09/18/2020] tranexamic acid  15 mg/kg Intravenous To OR  . [START ON 09/18/2020] tranexamic acid  2 mg/kg Intracatheter To OR   Continuous Infusions: . [START ON 09/18/2020] cefUROXime (ZINACEF)  IV    . [START ON 09/18/2020] cefUROXime (ZINACEF)  IV    . [START ON 09/18/2020] dexmedetomidine    . [START ON 09/18/2020] heparin 30,000 units/NS 1000 mL solution for CELLSAVER    . heparin 1,400 Units/hr (09/17/20 2979)  . [START ON 09/18/2020] milrinone    . [START ON 09/18/2020] nitroGLYCERIN    . [START ON 09/18/2020] norepinephrine    . [START ON 09/18/2020] tranexamic acid  (CYKLOKAPRON) infusion ([facility])    . [START ON 09/18/2020] vancomycin     PRN Meds: acetaminophen, ALPRAZolam, furosemide, nitroGLYCERIN, ondansetron (ZOFRAN) IV, potassium chloride, traMADol   Vital Signs    Vitals:   09/16/20 1109 09/16/20 1549 09/16/20 2100 09/17/20 0500  BP: 108/62 130/60 114/68 (!) 159/85  Pulse: 64 80 (!) 55 76  Resp: 18 19 18 18   Temp: 97.9 F (36.6 C) 98.3 F (36.8 C) 97.9 F (36.6 C) 97.7 F (36.5 C)  TempSrc: Oral Oral Oral Oral  SpO2: 99% 98% 98% 93%  Weight:    114.4 kg  Height:        Intake/Output Summary (Last 24 hours) at 09/17/2020 0919 Last data filed at 09/17/2020 0500 Gross per 24 hour  Intake 701.2 ml  Output 775 ml  Net -73.8 ml   Last 3 Weights 09/17/2020 09/16/2020 09/15/2020  Weight (lbs) 252 lb 3.3 oz 250 lb 11.2 oz 249 lb 12.5 oz  Weight (kg) 114.4 kg 113.717 kg 113.3 kg      Telemetry    Afib rate controlled - Personally Reviewed  ECG    No new tracing this morning  Physical Exam  Pleasant older WM, sitting up in chair GEN: No acute distress.   Neck: No JVD Cardiac: RRR, no murmurs, rubs, or gallops.  Respiratory: Clear to auscultation bilaterally. GI: Soft, nontender, non-distended  MS: No edema; No  deformity. Neuro:  Nonfocal  Psych: Normal affect   Labs    High Sensitivity Troponin:  No results for input(s): TROPONINIHS in the last 720 hours.    Chemistry Recent Labs  Lab 09/13/20 0124 09/16/20 0603  NA 138 137  K 3.8 4.1  CL 102 103  CO2 25 24  GLUCOSE 96 114*  BUN 13 16  CREATININE 0.88 0.97  CALCIUM 9.4 9.5  PROT  --  6.7  ALBUMIN  --  3.9  AST  --  24  ALT  --  26  ALKPHOS  --  52  BILITOT  --  0.9  GFRNONAA >60 >60  ANIONGAP 11 10     Hematology Recent Labs  Lab 09/14/20 0150 09/15/20 0324 09/16/20 0603  WBC 8.2 9.2 7.0  RBC 4.72 4.43 4.72  HGB 14.8 13.9 15.0  HCT 45.7 44.0 45.9  MCV 96.8 99.3 97.2  MCH 31.4 31.4 31.8  MCHC 32.4 31.6 32.7  RDW 12.1 12.1 12.2  PLT 149* 139*  149*    BNPNo results for input(s): BNP, PROBNP in the last 168 hours.   DDimer No results for input(s): DDIMER in the last 168 hours.   Radiology    DG Chest 2 View  Result Date: 09/16/2020 CLINICAL DATA:  Chest pain. EXAM: CHEST - 2 VIEW COMPARISON:  Chest CT 09/15/2020.  Chest x-ray 08/10/2010. FINDINGS: Mediastinum and hilar structures normal. Heart size stable. No pulmonary venous congestion. Low lung volumes. Very mild bilateral interstitial prominence cannot be excluded. Mild pneumonitis/interstitial edema cannot be excluded. No pleural effusion or pneumothorax. Diffuse thoracic spine osteopenia degenerative change. Kyphosis of the thoracic spine noted. IMPRESSION: Very mild bilateral interstitial prominence cannot be excluded. Mild pneumonitis/interstitial edema cannot be excluded. Electronically Signed   By: Marcello Moores  Register   On: 09/16/2020 06:30   CT ANGIO CHEST AORTA W/CM & OR WO/CM  Result Date: 09/15/2020 CLINICAL DATA:  Coronary artery disease. Preoperative planning prior to CABG EXAM: CT ANGIOGRAPHY CHEST WITH CONTRAST TECHNIQUE: Multidetector CT imaging of the chest was performed using the standard protocol during bolus administration of intravenous contrast. Multiplanar CT image reconstructions and MIPs were obtained to evaluate the vascular anatomy. CONTRAST:  63mL OMNIPAQUE IOHEXOL 350 MG/ML SOLN COMPARISON:  Chest x-ray 08/10/2010 FINDINGS: Cardiovascular: There is moderate dilation of left atrium and mild dilation of the right atrium. Overall heart size is upper limits of normal. No pericardial effusion. Thoracic aorta is normal in course and caliber without aneurysm or dissection. Three vessel arch without evidence of stenosis. There are scattered atherosclerotic calcifications involving the aorta, aortic valve, branch vessels, and coronary arteries. Central pulmonary vasculature is well opacified without filling defect to suggest pulmonary embolism. Mediastinum/Nodes: No  enlarged mediastinal, hilar, or axillary lymph nodes. Thyroid gland, trachea, and esophagus demonstrate no significant findings. Lungs/Pleura: Lungs are clear. No pleural effusion or pneumothorax. Upper Abdomen: Reflux of contrast into the IVC and hepatic veins. No acute findings within the visualized upper abdomen. Musculoskeletal: No chest wall abnormality. No acute or significant osseous findings. Review of the MIP images confirms the above findings. IMPRESSION: 1. Negative for thoracic aortic aneurysm or dissection. 2. Findings suggestive of right heart dysfunction. 3. Aortic and coronary artery atherosclerosis. (ICD10-I70.0). 4. Lungs are clear. Electronically Signed   By: Davina Poke D.O.   On: 09/15/2020 16:42   VAS [place] LOWER EXTREMITY SAPHENOUS VEIN MAPPING  Result Date: 09/16/2020 LOWER EXTREMITY VEIN MAPPING Indications:  Pre-op Risk Factors: Coronary artery disease.  Comparison Study: No prior  study Performing Technologist: Maudry Mayhew MHA, RDMS, RVT, RDCS  Examination Guidelines: A complete evaluation includes B-mode imaging, spectral Doppler, color Doppler, and power Doppler as needed of all accessible portions of each vessel. Bilateral testing is considered an integral part of a complete examination. Limited examinations for reoccurring indications may be performed as noted. +---------------+-----------+----------------------+---------------+-----------+   RT Diameter  RT Findings         GSV            LT Diameter  LT Findings      (cm)                                            (cm)                  +---------------+-----------+----------------------+---------------+-----------+      0.72                     Saphenofemoral         0.66                                                   Junction                                  +---------------+-----------+----------------------+---------------+-----------+      0.48       branching     Proximal thigh          0.61       branching  +---------------+-----------+----------------------+---------------+-----------+      0.41                       Mid thigh            0.31                  +---------------+-----------+----------------------+---------------+-----------+      0.43                      Distal thigh          0.32                  +---------------+-----------+----------------------+---------------+-----------+      0.44       branching          Knee              0.31                  +---------------+-----------+----------------------+---------------+-----------+      0.40       branching       Prox calf            0.27                  +---------------+-----------+----------------------+---------------+-----------+      0.37       branching        Mid calf            0.32       branching  +---------------+-----------+----------------------+---------------+-----------+      0.37  Distal calf           0.34                  +---------------+-----------+----------------------+---------------+-----------+      0.32       branching         Ankle              0.31                  +---------------+-----------+----------------------+---------------+-----------+ +----------------+-----------+---------------+----------------+--------------+ RT diameter (cm)RT Findings      SSV      LT Diameter (cm) LT Findings   +----------------+-----------+---------------+----------------+--------------+       0.37                 Popliteal fossa                not visualized +----------------+-----------+---------------+----------------+--------------+       0.27                  Proximal calf                 not visualized +----------------+-----------+---------------+----------------+--------------+       0.22                    Mid calf                    not visualized +----------------+-----------+---------------+----------------+--------------+        0.12                   Distal calf                  not visualized +----------------+-----------+---------------+----------------+--------------+ Diagnosing physician: Curt Jews MD Electronically signed by Curt Jews MD on 09/16/2020 at 4:20:56 PM.    Final    VAS US DOPPLER PRE CABG  Result Date: 09/16/2020 PREOPERATIVE VASCULAR EVALUATION  Indications:      Pre-CABG. Risk Factors:     Hypertension, hyperlipidemia, coronary artery disease. Other Factors:    History of known bilateral ICA occlusion. Limitations:      A-fib- limiting diagnostic capability of waveforms Comparison Study: No prior study Performing Technologist: Maudry Mayhew MHA, RVT, RDCS, RDMS  Examination Guidelines: A complete evaluation includes B-mode imaging, spectral Doppler, color Doppler, and power Doppler as needed of all accessible portions of each vessel. Bilateral testing is considered an integral part of a complete examination. Limited examinations for reoccurring indications may be performed as noted.  Right Carotid Findings: +----------+--------+-------+--------+--------------------------------+--------+           PSV cm/sEDV    StenosisDescribe                        Comments                   cm/s                                                    +----------+--------+-------+--------+--------------------------------+--------+ CCA Prox  64      11             heterogenous and calcific                +----------+--------+-------+--------+--------------------------------+--------+ CCA Distal87      13  smooth, heterogenous and                                                  calcific                                 +----------+--------+-------+--------+--------------------------------+--------+ ICA Prox                 Occluded                                         +----------+--------+-------+--------+--------------------------------+--------+ ECA        333     28                                                      +----------+--------+-------+--------+--------------------------------+--------+ Portions of this table do not appear on this page. +----------+--------+-------+----------------+------------+           PSV cm/sEDV cmsDescribe        Arm Pressure +----------+--------+-------+----------------+------------+ Subclavian169            Multiphasic, WNL             +----------+--------+-------+----------------+------------+ +---------+--------+--+--------+--+----------+ VertebralPSV cm/s40EDV cm/s21Retrograde +---------+--------+--+--------+--+----------+ Left Carotid Findings: +----------+--------+--------+--------+-----------------------+--------+           PSV cm/sEDV cm/sStenosisDescribe               Comments +----------+--------+--------+--------+-----------------------+--------+ CCA Prox  72      11                                              +----------+--------+--------+--------+-----------------------+--------+ CCA Distal85      16              heterogenous and smooth         +----------+--------+--------+--------+-----------------------+--------+ ICA Prox                  Occluded                                +----------+--------+--------+--------+-----------------------+--------+ ECA       154     22                                              +----------+--------+--------+--------+-----------------------+--------+ +----------+--------+--------+----------------+------------+ SubclavianPSV cm/sEDV cm/sDescribe        Arm Pressure +----------+--------+--------+----------------+------------+           354             Multiphasic, WNL             +----------+--------+--------+----------------+------------+ +---------+--------+--+--------+--+---------+ VertebralPSV cm/s90EDV cm/s37Antegrade +---------+--------+--+--------+--+---------+  ABI Findings:  +--------+------------------+-----+----------+--------+ Right   Rt Pressure (mmHg)IndexWaveform  Comment  +--------+------------------+-----+----------+--------+ [id number]  triphasic          +--------+------------------+-----+----------+--------+ PTA     103               0.72 monophasic         +--------+------------------+-----+----------+--------+ DP      51                0.36 monophasic         +--------+------------------+-----+----------+--------+ +--------+------------------+-----+----------+---------------------------------+ Left    Lt Pressure (mmHg)IndexWaveform  Comment                           +--------+------------------+-----+----------+---------------------------------+ Brachial                                 Unable to evaluate due to IV                                               location and bandaging            +--------+------------------+-----+----------+---------------------------------+ PTA     106               0.74 monophasic                                  +--------+------------------+-----+----------+---------------------------------+ DP      84                0.59 monophasic                                  +--------+------------------+-----+----------+---------------------------------+ +-------+---------------+----------------+ ABI/TBIToday's ABI/TBIPrevious ABI/TBI +-------+---------------+----------------+ Right  0.72                            +-------+---------------+----------------+ Left   0.74                            +-------+---------------+----------------+  Right Doppler Findings: +-----------+--------+-----+---------+-----------------------------------------+ Site       PressureIndexDoppler  Comments                                  +-----------+--------+-----+---------+-----------------------------------------+ Brachial   143          triphasic                                           +-----------+--------+-----+---------+-----------------------------------------+ Radial                  triphasic                                          +-----------+--------+-----+---------+-----------------------------------------+ Ulnar                   triphasic                                          +-----------+--------+-----+---------+-----------------------------------------+  Palmar Arch                      Signal is unaffected with radial                                           compression, obliterates with ulnar                                        compression.                              +-----------+--------+-----+---------+-----------------------------------------+  Left Doppler Findings: +-----------+--------+-----+---------+-----------------------------------------+ Site       PressureIndexDoppler  Comments                                  +-----------+--------+-----+---------+-----------------------------------------+ Brachial                         Unable to evaluate due to IV location and                                  bandaging                                 +-----------+--------+-----+---------+-----------------------------------------+ Radial                  triphasic                                          +-----------+--------+-----+---------+-----------------------------------------+ Ulnar                   triphasic                                          +-----------+--------+-----+---------+-----------------------------------------+ Palmar Arch                      Non-diagnostic due to a-fib               +-----------+--------+-----+---------+-----------------------------------------+  Summary: Right Carotid: Evidence consistent with a total occlusion of the right ICA. Left Carotid: Evidence consistent with a total occlusion of the left ICA. Vertebrals:  Bilateral vertebral arteries  demonstrate antegrade flow. Subclavians: Normal flow hemodynamics were seen in bilateral subclavian              arteries. Right ABI: Resting right ankle-brachial index indicates moderate right lower extremity arterial disease. Left ABI: Resting left ankle-brachial index indicates moderate left lower extremity arterial disease.     Preliminary     Cardiac Studies   Echo 09/13/2020 1. Left ventricular ejection fraction, by estimation, is 60 to 65%. The  left ventricle has normal function. The left ventricle has no regional  wall motion abnormalities. There is moderate left ventricular hypertrophy.  Left ventricular diastolic  parameters are indeterminate.  2. Right ventricular systolic function is normal. The right ventricular  size is normal.  3. Left atrial size was severely dilated.  4. Right atrial size was mildly dilated.  5. The mitral valve is grossly normal. Mild mitral valve regurgitation.  No evidence of mitral stenosis. Moderate mitral annular calcification.  6. The aortic valve is abnormal. There is severe calcifcation of the  aortic valve. Aortic valve regurgitation is not visualized. Aortic valve  sclerosis/calcification is present, without any evidence of aortic  stenosis. Mean systolic gradient 9 mmHg.   LEFT HEART CATH AND CORONARY ANGIOGRAPHY10/15/2021  Conclusion   Ost Cx to Prox Cx lesion is 95% stenosed.  Ost LAD to Prox LAD lesion is 70% stenosed.  Mid LAD lesion is 60% stenosed.  Diagnostic Dominance: Right    Patient Profile     69 y.o. male with a history of CAD s/p PCI LCx 2010, TIA (2012), bilateral occlusion of ICAmonitored by Dr. Lucky Cowboy, permanent atrial fibrillation, HLD,chronic back painpresented with chest pain found to have multivessel CAD on [facility]who [facility].  Assessment & Plan    1. Multivessel CAD: LHC with 70% ostial LAD, 60% mid LAD, ostial LCx 95%, RCA without significant disease. Seen by  surgery and plan for CABG + Atrial clip 10/21. No chest pain overnight, seen by CR this morning. -- continue medical therapy  2. Permanent atrial fibrillation: rate is controlled.  -- Continue Coreg at current dose -- on heparin for anticoagulation. Last dose of Eliquis 10/13. -- planned for Atrial clip during surgery  3. 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 HTN: intermittently elevated at times -- Continue Coreg 12.5mg  BID and losartan to 50mg  qd  5. HLD: 09/05/2020: Cholesterol, Total 108; HDL 44; LDL Chol Calc (NIH) 48; Triglycerides 76  -- Continue Crestor and Zetia  For questions or updates, please contact [place] Please consult [email or web address] for contact info under        Signed, Reino Bellis, NP  09/17/2020, 9:19 AM

## 2020-09-17 NOTE — Progress Notes (Signed)
Procedure(s) (LRB): CORONARY ARTERY BYPASS GRAFTING (CABG) (N/A) CLIPPING OF ATRIAL APPENDAGE (N/A) TRANSESOPHAGEAL ECHOCARDIOGRAM (TEE) (N/A) Subjective: Stable on IV heparin without angina Preoperative studies have been completed-saphenous vein mapping shows probable adequate conduit.  Carotid Dopplers show chronic stable occlusion of both internal carotid arteries at the origin with collateralization providing reconstitution of distal vessels.  He remains without neurologic symptoms of TIA. Upper extremity Doppler show both subclavian arteries to be without flow-limiting disease. Plan multivessel CABG with left IMA and saphenous vein tomorrow with left atrial clip for chronic persistent atrial fibrillation.  Discussed the risks of the surgery, especially the risk of stroke since his cerebrovascular circulation is chronically significantly diseased although with collateralization he has remained asymptomatic for several years.  He also understands the risks of bleeding even though we have held his Eliquis for several days.  Objective: Vital signs in last 24 hours: Temp:  [97.7 F (36.5 C)-98.3 F (36.8 C)] 98.2 F (36.8 C) (10/20 0922) Pulse Rate:  [55-80] 76 (10/20 0922) Cardiac Rhythm: Atrial fibrillation (10/20 0800) Resp:  [16-19] 16 (10/20 0922) BP: (114-159)/(60-85) 141/85 (10/20 0922) SpO2:  [93 %-98 %] 98 % (10/20 0922) Weight:  [114.4 kg] 114.4 kg (10/20 0500)  Hemodynamic parameters for last 24 hours:    Intake/Output from previous day: 10/19 0701 - 10/20 0700 In: 819.2 [P.O.:358; I.V.:461.2] Out: 775 [Urine:775] Intake/Output this shift: Total I/O In: 222 [P.O.:222] Out: 500 [Urine:500]  Alert and comfortable Heart rate regular, chronic atrial fib No murmur   Lab Results: Recent Labs    09/15/20 0324 09/16/20 0603  WBC 9.2 7.0  HGB 13.9 15.0  HCT 44.0 45.9  PLT 139* 149*   BMET:  Recent Labs    09/16/20 0603  NA 137  K 4.1  CL 103  CO2 24   GLUCOSE 114*  BUN 16  CREATININE 0.97  CALCIUM 9.5    PT/INR:  Recent Labs    09/16/20 0603  LABPROT 13.1  INR 1.0   ABG    Component Value Date/Time   PHART 7.404 09/15/2020 1002   HCO3 26.7 09/15/2020 1002   O2SAT 93.1 09/15/2020 1002   CBG (last 3)  No results for input(s): GLUCAP in the last 72 hours.  Assessment/Plan: S/P Procedure(s) (LRB): CORONARY ARTERY BYPASS GRAFTING (CABG) (N/A) CLIPPING OF ATRIAL APPENDAGE (N/A) TRANSESOPHAGEAL ECHOCARDIOGRAM (TEE) (N/A) Plan multivessel CABG with left atrial clip in a.m.   LOS: 5 days    Jeremy Reese 09/17/2020

## 2020-09-17 NOTE — Progress Notes (Signed)
[place] for heparin Indication: chest pain/ACS  Allergies  Allergen Reactions  . Propafenone Anxiety  . Codeine Nausea Only  . Penicillins Rash    Patient Measurements: Height: 5\' 11"  (180.3 cm) Weight: 114.4 kg (252 lb 3.3 oz) IBW/kg (Calculated) : 75.3 Heparin Dosing Weight: 100kg  Vital Signs: Temp: 97.7 F (36.5 C) (10/20 0500) Temp Source: Oral (10/20 0500) BP: 159/85 (10/20 0500) Pulse Rate: 76 (10/20 0500)  Labs: Recent Labs    09/15/20 0324 09/16/20 0603 09/17/20 0305  HGB 13.9 15.0  --   HCT 44.0 45.9  --   PLT 139* 149*  --   LABPROT  --  13.1  --   INR  --  1.0  --   HEPARINUNFRC 0.44 0.43 0.55  CREATININE  --  0.97  --     Estimated Creatinine Clearance: 93.7 mL/min (by C-G formula based on SCr of 0.97 mg/dL).   Assessment: 16 yoM s/p LHC with mvCAD to restart IV heparin 8h after sheath pull while awaiting CABG evaluation. Sheath pulled ~1000. Pt takes apixaban PTA, last dose was taken 10/13. CABG planned 10/21  Heparin level remains therapeutic aat 0.55  Goal of Therapy:  Heparin level 0.3-0.7 units/ml  Monitor platelets by anticoagulation protocol: Yes   Plan:  Continue heparin gtt at 1400 units/hr Daily heparin level, CBC, s/s bleeding  Hildred Laser, PharmD Clinical Pharmacist **Pharmacist phone directory can now be found on [email or web address] ([email or web address]).  Listed under [place].

## 2020-09-17 NOTE — Progress Notes (Signed)
Discussed sternal precautions, IS (1750 mL), mobility post op and d/c planning with pt. Pt receptive, admittedly anxious. Concerned regarding pain management post op and wishes there was another option besides surgery. Left materials to read/view. Wife will take time off to be with him at d/c. He was able to stand from recliner following sternal precautions but will need help post op. [place] CES, ACSM 9:29 AM 09/17/2020

## 2020-09-18 ENCOUNTER — Inpatient Hospital Stay (HOSPITAL_COMMUNITY): Payer: Medicare HMO | Admitting: Certified Registered"

## 2020-09-18 ENCOUNTER — Inpatient Hospital Stay (HOSPITAL_COMMUNITY): Payer: Medicare HMO

## 2020-09-18 ENCOUNTER — Inpatient Hospital Stay (HOSPITAL_COMMUNITY)
Admission: AD | Disposition: A | Discharge status: Home Health | Payer: Self-pay | Source: Other Acute Inpatient Hospital | Attending: Internal Medicine

## 2020-09-18 DIAGNOSIS — Z951 Presence of aortocoronary bypass graft: Secondary | ICD-10-CM

## 2020-09-18 HISTORY — PX: TEE WITHOUT CARDIOVERSION: SHX5443

## 2020-09-18 HISTORY — PX: CORONARY ARTERY BYPASS GRAFT: SHX141

## 2020-09-18 HISTORY — PX: CLIPPING OF ATRIAL APPENDAGE: SHX5773

## 2020-09-18 HISTORY — PX: ENDOVEIN HARVEST OF GREATER SAPHENOUS VEIN: SHX5059

## 2020-09-18 LAB — POCT I-STAT 7, (LYTES, BLD GAS, ICA,H+H)
Acid-Base Excess: 5 mmol/L — ABNORMAL HIGH (ref 0.0–2.0)
Acid-Base Excess: 5 mmol/L — ABNORMAL HIGH (ref 0.0–2.0)
Acid-Base Excess: 6 mmol/L — ABNORMAL HIGH (ref 0.0–2.0)
Acid-Base Excess: 3 mmol/L — ABNORMAL HIGH (ref 0.0–2.0)
Acid-Base Excess: 0 mmol/L (ref 0.0–2.0)
Acid-Base Excess: 1 mmol/L (ref 0.0–2.0)
Bicarbonate: 31.2 mmol/L — ABNORMAL HIGH (ref 20.0–28.0)
Bicarbonate: 29.3 mmol/L — ABNORMAL HIGH (ref 20.0–28.0)
Bicarbonate: 29.7 mmol/L — ABNORMAL HIGH (ref 20.0–28.0)
Bicarbonate: 29.4 mmol/L — ABNORMAL HIGH (ref 20.0–28.0)
Bicarbonate: 26.9 mmol/L (ref 20.0–28.0)
Bicarbonate: 26.5 mmol/L (ref 20.0–28.0)
Calcium, Ion: 1.05 mmol/L — ABNORMAL LOW (ref 1.15–1.40)
Calcium, Ion: 0.98 mmol/L — ABNORMAL LOW (ref 1.15–1.40)
Calcium, Ion: 0.99 mmol/L — ABNORMAL LOW (ref 1.15–1.40)
Calcium, Ion: 1.23 mmol/L (ref 1.15–1.40)
Calcium, Ion: 1.21 mmol/L (ref 1.15–1.40)
Calcium, Ion: 1.17 mmol/L (ref 1.15–1.40)
HCT: 31 % — ABNORMAL LOW (ref 39.0–52.0)
HCT: 33 % — ABNORMAL LOW (ref 39.0–52.0)
HCT: 34 % — ABNORMAL LOW (ref 39.0–52.0)
HCT: 32 % — ABNORMAL LOW (ref 39.0–52.0)
HCT: 37 % — ABNORMAL LOW (ref 39.0–52.0)
HCT: 38 % — ABNORMAL LOW (ref 39.0–52.0)
Hemoglobin: 10.5 g/dL — ABNORMAL LOW (ref 13.0–17.0)
Hemoglobin: 11.2 g/dL — ABNORMAL LOW (ref 13.0–17.0)
Hemoglobin: 11.6 g/dL — ABNORMAL LOW (ref 13.0–17.0)
Hemoglobin: 10.9 g/dL — ABNORMAL LOW (ref 13.0–17.0)
Hemoglobin: 12.6 g/dL — ABNORMAL LOW (ref 13.0–17.0)
Hemoglobin: 12.9 g/dL — ABNORMAL LOW (ref 13.0–17.0)
O2 Saturation: 100 %
O2 Saturation: 100 %
O2 Saturation: 100 %
O2 Saturation: 100 %
O2 Saturation: 94 %
O2 Saturation: 96 %
Patient temperature: 35.8
Patient temperature: 36
Potassium: 4.8 mmol/L (ref 3.5–5.1)
Potassium: 5.7 mmol/L — ABNORMAL HIGH (ref 3.5–5.1)
Potassium: 6 mmol/L — ABNORMAL HIGH (ref 3.5–5.1)
Potassium: 4.9 mmol/L (ref 3.5–5.1)
Potassium: 4.3 mmol/L (ref 3.5–5.1)
Potassium: 4.4 mmol/L (ref 3.5–5.1)
Sodium: 137 mmol/L (ref 135–145)
Sodium: 135 mmol/L (ref 135–145)
Sodium: 136 mmol/L (ref 135–145)
Sodium: 137 mmol/L (ref 135–145)
Sodium: 139 mmol/L (ref 135–145)
Sodium: 139 mmol/L (ref 135–145)
TCO2: 33 mmol/L — ABNORMAL HIGH (ref 22–32)
TCO2: 31 mmol/L (ref 22–32)
TCO2: 31 mmol/L (ref 22–32)
TCO2: 31 mmol/L (ref 22–32)
TCO2: 29 mmol/L (ref 22–32)
TCO2: 28 mmol/L (ref 22–32)
pCO2 arterial: 56.7 mmHg — ABNORMAL HIGH (ref 32.0–48.0)
pCO2 arterial: 40 mmHg (ref 32.0–48.0)
pCO2 arterial: 40.1 mmHg (ref 32.0–48.0)
pCO2 arterial: 56.1 mmHg — ABNORMAL HIGH (ref 32.0–48.0)
pCO2 arterial: 51.6 mmHg — ABNORMAL HIGH (ref 32.0–48.0)
pCO2 arterial: 40.6 mmHg (ref 32.0–48.0)
pH, Arterial: 7.348 — ABNORMAL LOW (ref 7.350–7.450)
pH, Arterial: 7.473 — ABNORMAL HIGH (ref 7.350–7.450)
pH, Arterial: 7.479 — ABNORMAL HIGH (ref 7.350–7.450)
pH, Arterial: 7.328 — ABNORMAL LOW (ref 7.350–7.450)
pH, Arterial: 7.319 — ABNORMAL LOW (ref 7.350–7.450)
pH, Arterial: 7.418 (ref 7.350–7.450)
pO2, Arterial: 322 mmHg — ABNORMAL HIGH (ref 83.0–108.0)
pO2, Arterial: 355 mmHg — ABNORMAL HIGH (ref 83.0–108.0)
pO2, Arterial: 378 mmHg — ABNORMAL HIGH (ref 83.0–108.0)
pO2, Arterial: 201 mmHg — ABNORMAL HIGH (ref 83.0–108.0)
pO2, Arterial: 72 mmHg — ABNORMAL LOW (ref 83.0–108.0)
pO2, Arterial: 78 mmHg — ABNORMAL LOW (ref 83.0–108.0)

## 2020-09-18 LAB — POCT I-STAT, CHEM 8
BUN: 16 mg/dL (ref 8–23)
BUN: 15 mg/dL (ref 8–23)
BUN: 15 mg/dL (ref 8–23)
BUN: 13 mg/dL (ref 8–23)
BUN: 14 mg/dL (ref 8–23)
Calcium, Ion: 1.33 mmol/L (ref 1.15–1.40)
Calcium, Ion: 1.28 mmol/L (ref 1.15–1.40)
Calcium, Ion: 1.12 mmol/L — ABNORMAL LOW (ref 1.15–1.40)
Calcium, Ion: 1.04 mmol/L — ABNORMAL LOW (ref 1.15–1.40)
Calcium, Ion: 1.24 mmol/L (ref 1.15–1.40)
Chloride: 100 mmol/L (ref 98–111)
Chloride: 99 mmol/L (ref 98–111)
Chloride: 98 mmol/L (ref 98–111)
Chloride: 98 mmol/L (ref 98–111)
Chloride: 98 mmol/L (ref 98–111)
Creatinine, Ser: 0.9 mg/dL (ref 0.61–1.24)
Creatinine, Ser: 0.8 mg/dL (ref 0.61–1.24)
Creatinine, Ser: 0.8 mg/dL (ref 0.61–1.24)
Creatinine, Ser: 0.7 mg/dL (ref 0.61–1.24)
Creatinine, Ser: 0.6 mg/dL — ABNORMAL LOW (ref 0.61–1.24)
Glucose, Bld: 99 mg/dL (ref 70–99)
Glucose, Bld: 110 mg/dL — ABNORMAL HIGH (ref 70–99)
Glucose, Bld: 122 mg/dL — ABNORMAL HIGH (ref 70–99)
Glucose, Bld: 138 mg/dL — ABNORMAL HIGH (ref 70–99)
Glucose, Bld: 125 mg/dL — ABNORMAL HIGH (ref 70–99)
HCT: 41 % (ref 39.0–52.0)
HCT: 38 % — ABNORMAL LOW (ref 39.0–52.0)
HCT: 35 % — ABNORMAL LOW (ref 39.0–52.0)
HCT: 35 % — ABNORMAL LOW (ref 39.0–52.0)
HCT: 33 % — ABNORMAL LOW (ref 39.0–52.0)
Hemoglobin: 13.9 g/dL (ref 13.0–17.0)
Hemoglobin: 12.9 g/dL — ABNORMAL LOW (ref 13.0–17.0)
Hemoglobin: 11.9 g/dL — ABNORMAL LOW (ref 13.0–17.0)
Hemoglobin: 11.9 g/dL — ABNORMAL LOW (ref 13.0–17.0)
Hemoglobin: 11.2 g/dL — ABNORMAL LOW (ref 13.0–17.0)
Potassium: 4.5 mmol/L (ref 3.5–5.1)
Potassium: 4.7 mmol/L (ref 3.5–5.1)
Potassium: 5.9 mmol/L — ABNORMAL HIGH (ref 3.5–5.1)
Potassium: 6.2 mmol/L — ABNORMAL HIGH (ref 3.5–5.1)
Potassium: 4.9 mmol/L (ref 3.5–5.1)
Sodium: 138 mmol/L (ref 135–145)
Sodium: 137 mmol/L (ref 135–145)
Sodium: 136 mmol/L (ref 135–145)
Sodium: 134 mmol/L — ABNORMAL LOW (ref 135–145)
Sodium: 138 mmol/L (ref 135–145)
TCO2: 30 mmol/L (ref 22–32)
TCO2: 28 mmol/L (ref 22–32)
TCO2: 30 mmol/L (ref 22–32)
TCO2: 28 mmol/L (ref 22–32)
TCO2: 28 mmol/L (ref 22–32)

## 2020-09-18 LAB — PROTIME-INR
INR: 1.4 — ABNORMAL HIGH (ref 0.8–1.2)
Prothrombin Time: 16.6 seconds — ABNORMAL HIGH (ref 11.4–15.2)

## 2020-09-18 LAB — CBC
HCT: 41.7 % (ref 39.0–52.0)
HCT: 37.3 % — ABNORMAL LOW (ref 39.0–52.0)
Hemoglobin: 13.3 g/dL (ref 13.0–17.0)
Hemoglobin: 12.2 g/dL — ABNORMAL LOW (ref 13.0–17.0)
MCH: 31.7 pg (ref 26.0–34.0)
MCH: 32.4 pg (ref 26.0–34.0)
MCHC: 31.9 g/dL (ref 30.0–36.0)
MCHC: 32.7 g/dL (ref 30.0–36.0)
MCV: 99.3 fL (ref 80.0–100.0)
MCV: 98.9 fL (ref 80.0–100.0)
Platelets: 151 10*3/uL (ref 150–400)
Platelets: 95 10*3/uL — ABNORMAL LOW (ref 150–400)
RBC: 4.2 MIL/uL — ABNORMAL LOW (ref 4.22–5.81)
RBC: 3.77 MIL/uL — ABNORMAL LOW (ref 4.22–5.81)
RDW: 12.3 % (ref 11.5–15.5)
RDW: 12.1 % (ref 11.5–15.5)
WBC: 7.8 10*3/uL (ref 4.0–10.5)
WBC: 10 10*3/uL (ref 4.0–10.5)
nRBC: 0 % (ref 0.0–0.2)
nRBC: 0 % (ref 0.0–0.2)

## 2020-09-18 LAB — BASIC METABOLIC PANEL
Anion gap: 8 (ref 5–15)
BUN: 21 mg/dL (ref 8–23)
CO2: 28 mmol/L (ref 22–32)
Calcium: 9.3 mg/dL (ref 8.9–10.3)
Chloride: 100 mmol/L (ref 98–111)
Creatinine, Ser: 1.27 mg/dL — ABNORMAL HIGH (ref 0.61–1.24)
GFR, Estimated: 58 mL/min — ABNORMAL LOW (ref >60)
Glucose, Bld: 96 mg/dL (ref 70–99)
Potassium: 5 mmol/L (ref 3.5–5.1)
Sodium: 136 mmol/L (ref 135–145)

## 2020-09-18 LAB — POCT I-STAT EG7
Acid-Base Excess: 7 mmol/L — ABNORMAL HIGH (ref 0.0–2.0)
Bicarbonate: 33.6 mmol/L — ABNORMAL HIGH (ref 20.0–28.0)
Calcium, Ion: 1.11 mmol/L — ABNORMAL LOW (ref 1.15–1.40)
HCT: 31 % — ABNORMAL LOW (ref 39.0–52.0)
Hemoglobin: 10.5 g/dL — ABNORMAL LOW (ref 13.0–17.0)
O2 Saturation: 82 %
Potassium: 4.5 mmol/L (ref 3.5–5.1)
Sodium: 141 mmol/L (ref 135–145)
TCO2: 35 mmol/L — ABNORMAL HIGH (ref 22–32)
pCO2, Ven: 58.7 mmHg (ref 44.0–60.0)
pH, Ven: 7.366 (ref 7.250–7.430)
pO2, Ven: 49 mmHg — ABNORMAL HIGH (ref 32.0–45.0)

## 2020-09-18 LAB — HEMOGLOBIN AND HEMATOCRIT, BLOOD
HCT: 36.7 % — ABNORMAL LOW (ref 39.0–52.0)
Hemoglobin: 12 g/dL — ABNORMAL LOW (ref 13.0–17.0)

## 2020-09-18 LAB — ECHO INTRAOPERATIVE TEE
Height: 71 in
Weight: 4010.61 oz

## 2020-09-18 LAB — GLUCOSE, CAPILLARY
Glucose-Capillary: 155 mg/dL — ABNORMAL HIGH (ref 70–99)
Glucose-Capillary: 154 mg/dL — ABNORMAL HIGH (ref 70–99)
Glucose-Capillary: 161 mg/dL — ABNORMAL HIGH (ref 70–99)

## 2020-09-18 LAB — PLATELET COUNT: Platelets: 101 10*3/uL — ABNORMAL LOW (ref 150–400)

## 2020-09-18 LAB — HEPARIN LEVEL (UNFRACTIONATED): Heparin Unfractionated: 0.4 IU/mL (ref 0.30–0.70)

## 2020-09-18 LAB — APTT: aPTT: 32 seconds (ref 24–36)

## 2020-09-18 SURGERY — CORONARY ARTERY BYPASS GRAFTING (CABG)
Anesthesia: General | Site: Chest | Laterality: Right

## 2020-09-18 MED ORDER — MIDAZOLAM HCL 2 MG/2ML IJ SOLN
2.0000 mg | INTRAMUSCULAR | Status: DC | PRN
  Administered 2020-09-18 – 2020-09-19 (×4): 2 mg via INTRAVENOUS
  Filled 2020-09-18 (×4): qty 2

## 2020-09-18 MED ORDER — ASPIRIN 81 MG PO CHEW: 324.0000 mg | CHEWABLE_TABLET | Freq: Every day | ORAL | Status: DC

## 2020-09-18 MED ORDER — MIDAZOLAM HCL 5 MG/5ML IJ SOLN
INTRAMUSCULAR | Status: DC | PRN
  Administered 2020-09-18 (×2): 2 mg via INTRAVENOUS
  Administered 2020-09-18 (×2): 1 mg via INTRAVENOUS
  Administered 2020-09-18 (×2): 2 mg via INTRAVENOUS

## 2020-09-18 MED ORDER — MIDAZOLAM HCL (PF) 10 MG/2ML IJ SOLN
INTRAMUSCULAR | Status: AC
  Filled 2020-09-18: qty 2

## 2020-09-18 MED ORDER — POTASSIUM CHLORIDE 10 MEQ/50ML IV SOLN: 10.0000 meq | INTRAVENOUS | Status: AC

## 2020-09-18 MED ORDER — ROCURONIUM BROMIDE 100 MG/10ML IV SOLN: INTRAVENOUS | Status: DC | PRN

## 2020-09-18 MED ORDER — FENTANYL CITRATE (PF) 100 MCG/2ML IJ SOLN
25.0000 ug | INTRAMUSCULAR | Status: DC | PRN
  Administered 2020-09-18 – 2020-09-20 (×6): 25 ug via INTRAVENOUS
  Filled 2020-09-18 (×5): qty 2

## 2020-09-18 MED ORDER — BISACODYL 5 MG PO TBEC
10.0000 mg | DELAYED_RELEASE_TABLET | Freq: Every day | ORAL | Status: DC
  Administered 2020-09-19 – 2020-09-21 (×3): 10 mg via ORAL
  Filled 2020-09-18 (×3): qty 2

## 2020-09-18 MED ORDER — METOPROLOL TARTRATE 12.5 MG HALF TABLET
12.5000 mg | ORAL_TABLET | Freq: Two times a day (BID) | ORAL | Status: DC
  Administered 2020-09-19 – 2020-09-20 (×3): 12.5 mg via ORAL
  Filled 2020-09-18 (×3): qty 1

## 2020-09-18 MED ORDER — ACETAMINOPHEN 160 MG/5ML PO SOLN
1000.0000 mg | Freq: Four times a day (QID) | ORAL | Status: DC
  Administered 2020-09-18 – 2020-09-19 (×2): 1000 mg
  Filled 2020-09-18 (×2): qty 40.6

## 2020-09-18 MED ORDER — SODIUM CHLORIDE 0.9% FLUSH: 3.0000 mL | INTRAVENOUS | Status: DC | PRN

## 2020-09-18 MED ORDER — METOPROLOL TARTRATE 25 MG/10 ML ORAL SUSPENSION: 12.5000 mg | Freq: Two times a day (BID) | ORAL | Status: DC

## 2020-09-18 MED ORDER — PROPOFOL 10 MG/ML IV BOLUS
INTRAVENOUS | Status: DC | PRN
  Administered 2020-09-18 (×2): 50 mg via INTRAVENOUS

## 2020-09-18 MED ORDER — LEVOFLOXACIN IN D5W 750 MG/150ML IV SOLN
750.0000 mg | INTRAVENOUS | Status: AC
  Administered 2020-09-19: 750 mg via INTRAVENOUS
  Filled 2020-09-18: qty 150

## 2020-09-18 MED ORDER — FENTANYL CITRATE (PF) 250 MCG/5ML IJ SOLN
INTRAMUSCULAR | Status: DC | PRN
  Administered 2020-09-18 (×2): 200 ug via INTRAVENOUS
  Administered 2020-09-18 (×3): 50 ug via INTRAVENOUS
  Administered 2020-09-18: 200 ug via INTRAVENOUS
  Administered 2020-09-18 (×2): 100 ug via INTRAVENOUS
  Administered 2020-09-18: 150 ug via INTRAVENOUS
  Administered 2020-09-18: 100 ug via INTRAVENOUS
  Administered 2020-09-18: 50 ug via INTRAVENOUS

## 2020-09-18 MED ORDER — MIDAZOLAM HCL 2 MG/2ML IJ SOLN: 1.0000 mg | Freq: Once | INTRAMUSCULAR | Status: AC

## 2020-09-18 MED ORDER — MIDAZOLAM HCL 2 MG/2ML IJ SOLN
INTRAMUSCULAR | Status: AC
  Administered 2020-09-18: 1 mg via INTRAVENOUS
  Filled 2020-09-18: qty 2

## 2020-09-18 MED ORDER — DEXMEDETOMIDINE HCL IN NACL 400 MCG/100ML IV SOLN
0.0000 ug/kg/h | INTRAVENOUS | Status: DC
  Administered 2020-09-18 – 2020-09-19 (×2): 0.7 ug/kg/h via INTRAVENOUS
  Filled 2020-09-18 (×2): qty 100

## 2020-09-18 MED ORDER — LACTATED RINGERS IV SOLN: 500.0000 mL | Freq: Once | INTRAVENOUS | Status: DC | PRN

## 2020-09-18 MED ORDER — SODIUM CHLORIDE 0.45 % IV SOLN: INTRAVENOUS | Status: DC | PRN

## 2020-09-18 MED ORDER — METOPROLOL TARTRATE 5 MG/5ML IV SOLN
2.5000 mg | INTRAVENOUS | Status: DC | PRN
  Administered 2020-09-18: 5 mg via INTRAVENOUS
  Administered 2020-09-19: 3 mg via INTRAVENOUS
  Administered 2020-09-20 – 2020-09-21 (×5): 5 mg via INTRAVENOUS
  Filled 2020-09-18 (×7): qty 5

## 2020-09-18 MED ORDER — CHLORHEXIDINE GLUCONATE CLOTH 2 % EX PADS
6.0000 | MEDICATED_PAD | Freq: Every day | CUTANEOUS | Status: DC
  Administered 2020-09-19 – 2020-09-21 (×3): 6 via TOPICAL

## 2020-09-18 MED ORDER — DOBUTAMINE IN D5W 4-5 MG/ML-% IV SOLN
0.0000 ug/kg/min | INTRAVENOUS | Status: DC
  Administered 2020-09-18: 2.5 ug/kg/min via INTRAVENOUS

## 2020-09-18 MED ORDER — MILRINONE LACTATE IN DEXTROSE 20-5 MG/100ML-% IV SOLN: 0.3500 mg/kg/min | INTRAVENOUS | Status: DC

## 2020-09-18 MED ORDER — NITROGLYCERIN IN D5W 200-5 MCG/ML-% IV SOLN: 0.0000 ug/min | INTRAVENOUS | Status: DC

## 2020-09-18 MED ORDER — ROSUVASTATIN CALCIUM 20 MG PO TABS
40.0000 mg | ORAL_TABLET | Freq: Every day | ORAL | Status: DC
  Administered 2020-09-19 – 2020-09-23 (×5): 40 mg via ORAL
  Filled 2020-09-18 (×5): qty 2

## 2020-09-18 MED ORDER — LACTATED RINGERS IV SOLN: INTRAVENOUS | Status: DC

## 2020-09-18 MED ORDER — ARTIFICIAL TEARS OPHTHALMIC OINT
TOPICAL_OINTMENT | OPHTHALMIC | Status: DC | PRN
  Administered 2020-09-18: 1 via OPHTHALMIC

## 2020-09-18 MED ORDER — SODIUM CHLORIDE 0.9% FLUSH: 10.0000 mL | INTRAVENOUS | Status: DC | PRN

## 2020-09-18 MED ORDER — ALBUMIN HUMAN 5 % IV SOLN
250.0000 mL | INTRAVENOUS | Status: AC | PRN
  Administered 2020-09-18 – 2020-09-19 (×3): 12.5 g via INTRAVENOUS
  Filled 2020-09-18: qty 250

## 2020-09-18 MED ORDER — VANCOMYCIN HCL IN DEXTROSE 1-5 GM/200ML-% IV SOLN
1000.0000 mg | Freq: Once | INTRAVENOUS | Status: AC
  Administered 2020-09-19: 1000 mg via INTRAVENOUS
  Filled 2020-09-18: qty 200

## 2020-09-18 MED ORDER — MILRINONE LACTATE IN DEXTROSE 20-5 MG/100ML-% IV SOLN
0.2500 ug/kg/min | INTRAVENOUS | Status: DC
  Administered 2020-09-19 – 2020-09-20 (×4): 0.25 ug/kg/min via INTRAVENOUS
  Filled 2020-09-18 (×4): qty 100

## 2020-09-18 MED ORDER — FAMOTIDINE IN NACL 20-0.9 MG/50ML-% IV SOLN
20.0000 mg | Freq: Two times a day (BID) | INTRAVENOUS | Status: AC
  Administered 2020-09-18 – 2020-09-19 (×2): 20 mg via INTRAVENOUS
  Filled 2020-09-18 (×2): qty 50

## 2020-09-18 MED ORDER — SODIUM CHLORIDE 0.9 % IV SOLN: 250.0000 mL | INTRAVENOUS | Status: DC

## 2020-09-18 MED ORDER — PROTAMINE SULFATE 10 MG/ML IV SOLN
INTRAVENOUS | Status: DC | PRN
  Administered 2020-09-18: 320 mg via INTRAVENOUS
  Administered 2020-09-18: 10 mg via INTRAVENOUS

## 2020-09-18 MED ORDER — EZETIMIBE 10 MG PO TABS
10.0000 mg | ORAL_TABLET | Freq: Every day | ORAL | Status: DC
  Administered 2020-09-19 – 2020-09-23 (×5): 10 mg via ORAL
  Filled 2020-09-18 (×5): qty 1

## 2020-09-18 MED ORDER — NITROGLYCERIN 0.2 MG/ML ON CALL CATH LAB
INTRAVENOUS | Status: DC | PRN
  Administered 2020-09-18 (×4): 20 ug via INTRAVENOUS

## 2020-09-18 MED ORDER — ONDANSETRON HCL 4 MG/2ML IJ SOLN: 4.0000 mg | Freq: Four times a day (QID) | INTRAMUSCULAR | Status: DC | PRN

## 2020-09-18 MED ORDER — DOPAMINE-DEXTROSE 3.2-5 MG/ML-% IV SOLN
0.0000 ug/kg/min | Freq: Once | INTRAVENOUS | Status: DC
  Filled 2020-09-18: qty 250

## 2020-09-18 MED ORDER — PANTOPRAZOLE SODIUM 40 MG PO TBEC
40.0000 mg | DELAYED_RELEASE_TABLET | Freq: Every day | ORAL | Status: DC
  Administered 2020-09-20 – 2020-09-21 (×2): 40 mg via ORAL
  Filled 2020-09-18 (×2): qty 1

## 2020-09-18 MED ORDER — LACTATED RINGERS IV SOLN: INTRAVENOUS | Status: DC | PRN

## 2020-09-18 MED ORDER — HYDRALAZINE HCL 20 MG/ML IJ SOLN
20.0000 mg | Freq: Once | INTRAMUSCULAR | Status: AC
  Administered 2020-09-18: 20 mg via INTRAVENOUS

## 2020-09-18 MED ORDER — ALBUMIN HUMAN 5 % IV SOLN: INTRAVENOUS | Status: DC | PRN

## 2020-09-18 MED ORDER — ROCURONIUM BROMIDE 10 MG/ML (PF) SYRINGE
PREFILLED_SYRINGE | INTRAVENOUS | Status: DC | PRN
  Administered 2020-09-18 (×3): 50 mg via INTRAVENOUS
  Administered 2020-09-18: 100 mg via INTRAVENOUS
  Administered 2020-09-18: 50 mg via INTRAVENOUS

## 2020-09-18 MED ORDER — ACETAMINOPHEN 500 MG PO TABS
1000.0000 mg | ORAL_TABLET | Freq: Four times a day (QID) | ORAL | Status: DC
  Administered 2020-09-19 – 2020-09-21 (×9): 1000 mg via ORAL
  Filled 2020-09-18 (×9): qty 2

## 2020-09-18 MED ORDER — SODIUM CHLORIDE 0.9% FLUSH
10.0000 mL | Freq: Two times a day (BID) | INTRAVENOUS | Status: DC
  Administered 2020-09-18: 10 mL
  Administered 2020-09-19: 20 mL
  Administered 2020-09-19 – 2020-09-20 (×3): 10 mL

## 2020-09-18 MED ORDER — DEXMEDETOMIDINE HCL IN NACL 200 MCG/50ML IV SOLN
INTRAVENOUS | Status: AC
  Filled 2020-09-18: qty 50

## 2020-09-18 MED ORDER — FENTANYL CITRATE (PF) 250 MCG/5ML IJ SOLN
INTRAMUSCULAR | Status: AC
  Filled 2020-09-18: qty 25

## 2020-09-18 MED ORDER — NICARDIPINE HCL IN NACL 20-0.86 MG/200ML-% IV SOLN
5.0000 mg/h | INTRAVENOUS | Status: DC
  Administered 2020-09-19: 5 mg/h via INTRAVENOUS
  Filled 2020-09-18 (×2): qty 200

## 2020-09-18 MED ORDER — ASPIRIN EC 325 MG PO TBEC
325.0000 mg | DELAYED_RELEASE_TABLET | Freq: Every day | ORAL | Status: DC
  Administered 2020-09-19 – 2020-09-21 (×3): 325 mg via ORAL
  Filled 2020-09-18 (×3): qty 1

## 2020-09-18 MED ORDER — PROPOFOL 10 MG/ML IV BOLUS
INTRAVENOUS | Status: AC
  Filled 2020-09-18: qty 20

## 2020-09-18 MED ORDER — CHLORHEXIDINE GLUCONATE 0.12 % MT SOLN
15.0000 mL | OROMUCOSAL | Status: AC
  Administered 2020-09-18: 15 mL via OROMUCOSAL
  Filled 2020-09-18: qty 15

## 2020-09-18 MED ORDER — DEXTROSE 50 % IV SOLN: 0.0000 mL | INTRAVENOUS | Status: DC | PRN

## 2020-09-18 MED ORDER — PHENYLEPHRINE HCL-NACL 20-0.9 MG/250ML-% IV SOLN: 0.0000 ug/min | INTRAVENOUS | Status: DC

## 2020-09-18 MED ORDER — TRAMADOL HCL 50 MG PO TABS
50.0000 mg | ORAL_TABLET | ORAL | Status: DC | PRN
  Administered 2020-09-19 – 2020-09-20 (×4): 100 mg via ORAL
  Administered 2020-09-20: 50 mg via ORAL
  Administered 2020-09-21: 100 mg via ORAL
  Filled 2020-09-18 (×6): qty 2

## 2020-09-18 MED ORDER — ACETAMINOPHEN 650 MG RE SUPP: 650.0000 mg | Freq: Once | RECTAL | Status: DC

## 2020-09-18 MED ORDER — PHENYLEPHRINE 40 MCG/ML (10ML) SYRINGE FOR IV PUSH (FOR BLOOD PRESSURE SUPPORT)
PREFILLED_SYRINGE | INTRAVENOUS | Status: DC | PRN
  Administered 2020-09-18 (×3): 80 ug via INTRAVENOUS

## 2020-09-18 MED ORDER — BISACODYL 10 MG RE SUPP: 10.0000 mg | Freq: Every day | RECTAL | Status: DC

## 2020-09-18 MED ORDER — SODIUM CHLORIDE (PF) 0.9 % IJ SOLN
OROMUCOSAL | Status: DC | PRN
  Administered 2020-09-18 (×3): 4 mL via TOPICAL

## 2020-09-18 MED ORDER — DOCUSATE SODIUM 100 MG PO CAPS
200.0000 mg | ORAL_CAPSULE | Freq: Every day | ORAL | Status: DC
  Administered 2020-09-19 – 2020-09-21 (×3): 200 mg via ORAL
  Filled 2020-09-18 (×3): qty 2

## 2020-09-18 MED ORDER — HYDRALAZINE HCL 20 MG/ML IJ SOLN
INTRAMUSCULAR | Status: AC
  Filled 2020-09-18: qty 1

## 2020-09-18 MED ORDER — PLASMA-LYTE 148 IV SOLN
INTRAVENOUS | Status: DC | PRN
  Administered 2020-09-18: 500 mL via INTRAVASCULAR

## 2020-09-18 MED ORDER — MILRINONE LACTATE IN DEXTROSE 20-5 MG/100ML-% IV SOLN: 0.2500 ug/kg/min | INTRAVENOUS | Status: DC

## 2020-09-18 MED ORDER — MAGNESIUM SULFATE 4 GM/100ML IV SOLN
4.0000 g | Freq: Once | INTRAVENOUS | Status: AC
  Administered 2020-09-18: 4 g via INTRAVENOUS
  Filled 2020-09-18: qty 100

## 2020-09-18 MED ORDER — INSULIN REGULAR(HUMAN) IN NACL 100-0.9 UT/100ML-% IV SOLN: INTRAVENOUS | Status: DC

## 2020-09-18 MED ORDER — HEPARIN SODIUM (PORCINE) 1000 UNIT/ML IJ SOLN
INTRAMUSCULAR | Status: DC | PRN
  Administered 2020-09-18: 2000 [IU] via INTRAVENOUS
  Administered 2020-09-18: 33000 [IU] via INTRAVENOUS

## 2020-09-18 MED ORDER — LABETALOL HCL 5 MG/ML IV SOLN
20.0000 mg | INTRAVENOUS | Status: AC | PRN
  Administered 2020-09-18 – 2020-09-19 (×3): 20 mg via INTRAVENOUS
  Filled 2020-09-18 (×3): qty 4

## 2020-09-18 MED ORDER — SODIUM CHLORIDE 0.9% FLUSH
3.0000 mL | Freq: Two times a day (BID) | INTRAVENOUS | Status: DC
  Administered 2020-09-19 – 2020-09-21 (×5): 3 mL via INTRAVENOUS

## 2020-09-18 MED ORDER — SODIUM CHLORIDE 0.9 % IV SOLN: INTRAVENOUS | Status: DC

## 2020-09-18 MED ORDER — HEMOSTATIC AGENTS (NO CHARGE) OPTIME
TOPICAL | Status: DC | PRN
  Administered 2020-09-18 (×3): 1 via TOPICAL

## 2020-09-18 MED ORDER — SODIUM CHLORIDE 0.9 % IV SOLN: INTRAVENOUS | Status: DC | PRN

## 2020-09-18 MED ORDER — ACETAMINOPHEN 160 MG/5ML PO SOLN: 650.0000 mg | Freq: Once | ORAL | Status: DC

## 2020-09-18 MED ORDER — 0.9 % SODIUM CHLORIDE (POUR BTL) OPTIME
TOPICAL | Status: DC | PRN
  Administered 2020-09-18: 5000 mL

## 2020-09-18 MED FILL — Heparin Sodium (Porcine) Inj 1000 Unit/ML: INTRAMUSCULAR | Qty: 30 | Status: AC

## 2020-09-18 MED FILL — Heparin Sodium (Porcine) Inj 1000 Unit/ML: INTRAMUSCULAR | Qty: 2500 | Status: AC

## 2020-09-18 MED FILL — Magnesium Sulfate Inj 50%: INTRAMUSCULAR | Qty: 10 | Status: AC

## 2020-09-18 MED FILL — Potassium Chloride Inj 2 mEq/ML: INTRAVENOUS | Qty: 40 | Status: AC

## 2020-09-18 SURGICAL SUPPLY — 108 items
ADAPTER CARDIO PERF ANTE/RETRO (ADAPTER) ×5 IMPLANT
ATRICLIP EXCLUSION VLAA 45 (Miscellaneous) ×4 IMPLANT
ATRICLIP EXCLUSION VLAA 45MM (Miscellaneous) ×1 IMPLANT
BAG DECANTER FOR FLEXI CONT (MISCELLANEOUS) ×5 IMPLANT
BENZOIN TINCTURE PRP APPL 2/3 (GAUZE/BANDAGES/DRESSINGS) ×10 IMPLANT
BLADE CLIPPER SURG (BLADE) ×5 IMPLANT
BLADE MINI RND TIP GREEN BEAV (BLADE) ×5 IMPLANT
BLADE STERNUM SYSTEM 6 (BLADE) ×5 IMPLANT
BLADE SURG 11 STRL SS (BLADE) ×5 IMPLANT
BLADE SURG 12 STRL SS (BLADE) ×5 IMPLANT
BNDG ELASTIC 4X5.8 VLCR STR LF (GAUZE/BANDAGES/DRESSINGS) ×5 IMPLANT
BNDG ELASTIC 6X5.8 VLCR STR LF (GAUZE/BANDAGES/DRESSINGS) ×5 IMPLANT
BNDG GAUZE ELAST 4 BULKY (GAUZE/BANDAGES/DRESSINGS) ×5 IMPLANT
CANISTER SUCT 3000ML PPV (MISCELLANEOUS) ×5 IMPLANT
CANISTER WOUNDNEG PRESSURE 500 (CANNISTER) ×5 IMPLANT
CANNULA ARTERIAL NVNT 3/8 22FR (MISCELLANEOUS) ×5 IMPLANT
CANNULA GUNDRY RCSP 15FR (MISCELLANEOUS) ×5 IMPLANT
CATH CPB KIT VANTRIGT (MISCELLANEOUS) ×5 IMPLANT
CATH ROBINSON RED A/P 18FR (CATHETERS) ×25 IMPLANT
CATH THORACIC 28FR RT ANG (CATHETERS) ×10 IMPLANT
CLIP VESOCCLUDE SM WIDE 24/CT (CLIP) ×5 IMPLANT
CONN 3/8X3/8 GISH STERILE (MISCELLANEOUS) ×5 IMPLANT
DEFOGGER ANTIFOG KIT (MISCELLANEOUS) ×5 IMPLANT
DEVICE EXCLUSIN ATRCLP VLAA 45 (Miscellaneous) ×3 IMPLANT
DRAIN CHANNEL 32F RND 10.7 FF (WOUND CARE) ×10 IMPLANT
DRAPE CARDIOVASCULAR INCISE (DRAPES) ×2
DRAPE SLUSH/WARMER DISC (DRAPES) ×5 IMPLANT
DRAPE SRG 135X102X78XABS (DRAPES) ×3 IMPLANT
DRSG AQUACEL AG ADV 3.5X14 (GAUZE/BANDAGES/DRESSINGS) ×5 IMPLANT
ELECT BLADE 4.0 EZ CLEAN MEGAD (MISCELLANEOUS) ×5
ELECT BLADE 6.5 EXT (BLADE) ×5 IMPLANT
ELECT CAUTERY BLADE 6.4 (BLADE) ×5 IMPLANT
ELECT REM PT RETURN 9FT ADLT (ELECTROSURGICAL) ×10
ELECTRODE BLDE 4.0 EZ CLN MEGD (MISCELLANEOUS) ×3 IMPLANT
ELECTRODE REM PT RTRN 9FT ADLT (ELECTROSURGICAL) ×6 IMPLANT
FELT TEFLON 1X6 (MISCELLANEOUS) ×5 IMPLANT
GAUZE SPONGE 4X4 12PLY STRL (GAUZE/BANDAGES/DRESSINGS) ×10 IMPLANT
GAUZE XEROFORM 1X8 LF (GAUZE/BANDAGES/DRESSINGS) ×5 IMPLANT
GLOVE BIO SURGEON STRL SZ 6 (GLOVE) ×5 IMPLANT
GLOVE BIO SURGEON STRL SZ 6.5 (GLOVE) ×12 IMPLANT
GLOVE BIO SURGEON STRL SZ7.5 (GLOVE) ×15 IMPLANT
GLOVE BIO SURGEONS STRL SZ 6.5 (GLOVE) ×3
GLOVE BIOGEL PI IND STRL 6.5 (GLOVE) ×15 IMPLANT
GLOVE BIOGEL PI INDICATOR 6.5 (GLOVE) ×10
GLOVE SURG SS PI 6.0 STRL IVOR (GLOVE) ×15 IMPLANT
GOWN STRL REUS W/ TWL LRG LVL3 (GOWN DISPOSABLE) ×21 IMPLANT
GOWN STRL REUS W/TWL LRG LVL3 (GOWN DISPOSABLE) ×14
HEMOSTAT POWDER SURGIFOAM 1G (HEMOSTASIS) ×15 IMPLANT
HEMOSTAT SURGICEL 2X14 (HEMOSTASIS) ×5 IMPLANT
INSERT FOGARTY XLG (MISCELLANEOUS) IMPLANT
IV ADAPTER SYR DOUBLE MALE LL (MISCELLANEOUS) ×5 IMPLANT
KIT BASIN OR (CUSTOM PROCEDURE TRAY) ×5 IMPLANT
KIT PREVENA INCISION MGT20CM45 (CANNISTER) ×5 IMPLANT
KIT SUCTION CATH 14FR (SUCTIONS) ×5 IMPLANT
KIT TURNOVER KIT B (KITS) ×5 IMPLANT
KIT VASOVIEW HEMOPRO 2 VH 4000 (KITS) ×5 IMPLANT
LEAD PACING MYOCARDI (MISCELLANEOUS) ×5 IMPLANT
MARKER GRAFT CORONARY BYPASS (MISCELLANEOUS) ×15 IMPLANT
NS IRRIG 1000ML POUR BTL (IV SOLUTION) ×25 IMPLANT
PACK E OPEN HEART (SUTURE) ×5 IMPLANT
PACK OPEN HEART (CUSTOM PROCEDURE TRAY) ×5 IMPLANT
PAD ARMBOARD 7.5X6 YLW CONV (MISCELLANEOUS) ×10 IMPLANT
PAD ELECT DEFIB RADIOL ZOLL (MISCELLANEOUS) ×5 IMPLANT
PENCIL BUTTON HOLSTER BLD 10FT (ELECTRODE) ×5 IMPLANT
POSITIONER HEAD DONUT 9IN (MISCELLANEOUS) ×5 IMPLANT
POWDER SURGICEL 3.0 GRAM (HEMOSTASIS) ×5 IMPLANT
PUNCH AORTIC ROTATE 4.5MM 8IN (MISCELLANEOUS) ×5 IMPLANT
SET CARDIOPLEGIA MPS 5001102 (MISCELLANEOUS) ×5 IMPLANT
STOPCOCK 4 WAY LG BORE MALE ST (IV SETS) ×5 IMPLANT
SUPPORT HEART JANKE-BARRON (MISCELLANEOUS) ×5 IMPLANT
SURGIFLO W/THROMBIN 8M KIT (HEMOSTASIS) ×5 IMPLANT
SUT BONE WAX W31G (SUTURE) ×5 IMPLANT
SUT MNCRL AB 4-0 PS2 18 (SUTURE) ×10 IMPLANT
SUT PROLENE 4 0 RB 1 (SUTURE) ×10
SUT PROLENE 4 0 SH DA (SUTURE) ×10 IMPLANT
SUT PROLENE 4-0 RB1 .5 CRCL 36 (SUTURE) ×15 IMPLANT
SUT PROLENE 5 0 C 1 36 (SUTURE) ×15 IMPLANT
SUT PROLENE 6 0 C 1 30 (SUTURE) ×10 IMPLANT
SUT PROLENE 6 0 CC (SUTURE) ×15 IMPLANT
SUT PROLENE 7 0 BV1 MDA (SUTURE) IMPLANT
SUT PROLENE 8 0 BV175 6 (SUTURE) ×5 IMPLANT
SUT PROLENE BLUE 7 0 (SUTURE) ×10 IMPLANT
SUT PROLENE POLY MONO (SUTURE) ×10 IMPLANT
SUT SILK  1 MH (SUTURE)
SUT SILK 1 MH (SUTURE) IMPLANT
SUT SILK 2 0 SH CR/8 (SUTURE) ×5 IMPLANT
SUT SILK 3 0 SH CR/8 (SUTURE) IMPLANT
SUT STEEL 6MS V (SUTURE) ×5 IMPLANT
SUT STEEL STERNAL CCS#1 18IN (SUTURE) ×5 IMPLANT
SUT STEEL SZ 6 DBL 3X14 BALL (SUTURE) ×10 IMPLANT
SUT VIC AB 1 CTX 36 (SUTURE) ×6
SUT VIC AB 1 CTX36XBRD ANBCTR (SUTURE) ×9 IMPLANT
SUT VIC AB 2-0 CT1 27 (SUTURE) ×6
SUT VIC AB 2-0 CT1 TAPERPNT 27 (SUTURE) ×9 IMPLANT
SUT VIC AB 2-0 CTX 36 (SUTURE) ×10 IMPLANT
SYSTEM SAHARA CHEST DRAIN ATS (WOUND CARE) ×5 IMPLANT
TAPE CLOTH SOFT 2X10 (GAUZE/BANDAGES/DRESSINGS) ×5 IMPLANT
TAPE CLOTH SURG 4X10 WHT LF (GAUZE/BANDAGES/DRESSINGS) ×5 IMPLANT
TOWEL GREEN STERILE (TOWEL DISPOSABLE) ×5 IMPLANT
TOWEL GREEN STERILE FF (TOWEL DISPOSABLE) ×5 IMPLANT
TRAY CATH LUMEN 1 20CM STRL (SET/KITS/TRAYS/PACK) ×5 IMPLANT
TRAY FOLEY SLVR 16FR TEMP STAT (SET/KITS/TRAYS/PACK) ×5 IMPLANT
TUBE CONNECTING 12'X1/4 (SUCTIONS) ×1
TUBE CONNECTING 12X1/4 (SUCTIONS) ×4 IMPLANT
TUBING ART PRESS 48 MALE/FEM (TUBING) ×10 IMPLANT
TUBING LAP HI FLOW INSUFFLATIO (TUBING) ×5 IMPLANT
UNDERPAD 30X36 HEAVY ABSORB (UNDERPADS AND DIAPERS) ×5 IMPLANT
WATER STERILE IRR 1000ML POUR (IV SOLUTION) ×10 IMPLANT

## 2020-09-18 NOTE — OR Nursing (Signed)
RADIOLOGY IN TO PERFORM CHEST X-RAY TO R/O PNEUMO. DR. VAN TRIGT IN AND REVIEWED RESULTS.

## 2020-09-18 NOTE — Transfer of Care (Signed)
Immediate Anesthesia Transfer of Care Note  Patient: Jeremy Reese  Procedure(s) Performed: CORONARY ARTERY BYPASS GRAFTING (CABG)X 3, ON PUMP, USING LEFT INTERAL MAMMARY ARTERY AND ENDOSCOPICALLY HARVESTED RIGHT GREATER SAPHENOUS VEIN. LIMA TO LAD, SVG TO OM, SVG TO PD (N/A Chest) CLIPPING OF ATRIAL APPENDAGE USING 80 ATRICURE LAA  EXCLUSION SYSTEM (N/A ) TRANSESOPHAGEAL ECHOCARDIOGRAM (TEE) (N/A ) ENDOVEIN HARVEST OF GREATER SAPHENOUS VEIN (Right )  Patient Location: ICU  Anesthesia Type:General  Level of Consciousness: Patient remains intubated per anesthesia plan  Airway & Oxygen Therapy: Patient remains intubated per anesthesia plan and Patient placed on Ventilator (see vital sign flow sheet for setting)  Post-op Assessment: Report given to RN and Post -op Vital signs reviewed and stable  Post vital signs: Reviewed and stable  Last Vitals:  Vitals Value Taken Time  BP    Temp 35.8 C 09/18/20 1957  Pulse 88 09/18/20 1957  Resp 12 09/18/20 1957  SpO2 94 % 09/18/20 1957  Vitals shown include unvalidated device data.  Last Pain:  Vitals:   09/18/20 0427  TempSrc: Oral  PainSc:       Patients Stated Pain Goal: 0 (34/28/76 8115)  Complications: No complications documented.

## 2020-09-18 NOTE — Progress Notes (Signed)
Patient and wife made aware of delay. Patient resting comfortably.

## 2020-09-18 NOTE — Progress Notes (Signed)
      [address]       [place] [address]             [phone]      S/p CABG  Intubated, sedated  BP (!) 134/103 (BP Location: Left Arm)   Pulse 71   Temp (!) 97.4 F (36.3 C) (Oral)   Resp 14   Ht 5\' 11"  (1.803 m)   Wt 113.7 kg   SpO2 95%   BMI 34.96 kg/m  IMV 12/50%/5 PEEP 7.33/56/201 Milrinone 0.35, insulin 0.9 U   Intake/Output Summary (Last 24 hours) at 09/18/2020 2005 Last data filed at 09/18/2020 1900 Gross per 24 hour  Intake 5191.05 ml  Output 5957 ml  Net -765.95 ml   K= 4.9, creatinine 0.6 Hct= 33  Just back from OR, stable early postop  Jeremy Reese C. Roxan Hockey, MD [facility] [phone]

## 2020-09-18 NOTE — Anesthesia Preprocedure Evaluation (Addendum)
Anesthesia Evaluation  Patient identified by MRN, date of birth, ID band Patient awake    Reviewed: Allergy & Precautions, NPO status , Patient's Chart, lab work & pertinent test results, reviewed documented beta blocker date and time   Airway Mallampati: II  TM Distance: >3 FB Neck ROM: Full    Dental  (+) Teeth Intact   Pulmonary former smoker,    Pulmonary exam normal breath sounds clear to auscultation       Cardiovascular hypertension, Pt. on home beta blockers and Pt. on medications + angina + CAD, + Peripheral Vascular Disease and +CHF  + dysrhythmias Atrial Fibrillation  Rhythm:Irregular Rate:Abnormal     Neuro/Psych TIA   GI/Hepatic negative GI ROS, Neg liver ROS,   Endo/Other  negative endocrine ROSObesity   Renal/GU negative Renal ROS     Musculoskeletal  (+) Arthritis ,   Abdominal   Peds  Hematology  (+) Blood dyscrasia (Eliquis), ,   Anesthesia Other Findings Day of surgery medications reviewed with the patient.  Reproductive/Obstetrics                            Anesthesia Physical Anesthesia Plan  ASA: IV  Anesthesia Plan: General   Post-op Pain Management:    Induction: Intravenous  PONV Risk Score and Plan: 2 and Treatment may vary due to age or medical condition and Midazolam  Airway Management Planned: Oral ETT  Additional Equipment: Arterial line, PA Cath, CVP, TEE and Ultrasound Guidance Line Placement  Intra-op Plan:   Post-operative Plan: Post-operative intubation/ventilation  Informed Consent: I have reviewed the patients History and Physical, chart, labs and discussed the procedure including the risks, benefits and alternatives for the proposed anesthesia with the patient or authorized representative who has indicated his/her understanding and acceptance.       Plan Discussed with: CRNA  Anesthesia Plan Comments:         Anesthesia Quick  Evaluation

## 2020-09-18 NOTE — Anesthesia Procedure Notes (Addendum)
Central Venous Catheter Insertion Performed by: Belinda Block, MD, anesthesiologist Start/End10/21/2021 6:40 AM, 09/18/2020 6:55 AM Patient location: Pre-op. Preanesthetic checklist: patient identified, IV checked, site marked, risks and benefits discussed, surgical consent, monitors and equipment checked, pre-op evaluation, timeout performed and anesthesia consent Lidocaine 1% used for infiltration and patient sedated Hand hygiene performed  and maximum sterile barriers used  Catheter size: 8.5 Fr Sheath introducer Procedure performed using ultrasound guided technique. Ultrasound Notes:anatomy identified, needle tip was noted to be adjacent to the nerve/plexus identified, no ultrasound evidence of intravascular and/or intraneural injection and image(s) printed for medical record Attempts: 1 Following insertion, line sutured and dressing applied. Post procedure assessment: blood return through all ports, free fluid flow and no air  Patient tolerated the procedure well with no immediate complications.

## 2020-09-18 NOTE — Anesthesia Procedure Notes (Signed)
Arterial Line Insertion Start/End10/21/2021 7:05 AM, 09/18/2020 7:10 AM Performed by: Catalina Gravel, MD, Griffin Dakin, CRNA, CRNA  Patient location: Pre-op. Preanesthetic checklist: patient identified, IV checked, site marked, risks and benefits discussed, surgical consent, monitors and equipment checked, pre-op evaluation, timeout performed and anesthesia consent Lidocaine 1% used for infiltration Right, radial was placed Catheter size: 20 G Hand hygiene performed  and maximum sterile barriers used   Attempts: 1 Procedure performed without using ultrasound guided technique. Following insertion, dressing applied. Post procedure assessment: normal and unchanged  Patient tolerated the procedure well with no immediate complications.

## 2020-09-18 NOTE — Anesthesia Procedure Notes (Signed)
Procedure Name: Intubation Date/Time: 09/18/2020 12:54 PM Performed by: Griffin Dakin, CRNA Pre-anesthesia Checklist: Patient identified, Emergency Drugs available, Suction available and Patient being monitored Patient Re-evaluated:Patient Re-evaluated prior to induction Oxygen Delivery Method: Circle system utilized Preoxygenation: Pre-oxygenation with 100% oxygen Induction Type: IV induction Ventilation: Mask ventilation without difficulty and Oral airway inserted - appropriate to patient size Laryngoscope Size: Mac and 4 Grade View: Grade II Tube type: Oral Tube size: 8.0 mm Number of attempts: 1 Airway Equipment and Method: Stylet and Oral airway Placement Confirmation: ETT inserted through vocal cords under direct vision,  positive ETCO2 and breath sounds checked- equal and bilateral Secured at: 23 cm Tube secured with: Tape Dental Injury: Teeth and Oropharynx as per pre-operative assessment

## 2020-09-18 NOTE — Anesthesia Procedure Notes (Addendum)
Central Venous Catheter Insertion Performed by: Belinda Block, MD, anesthesiologist Start/End10/21/2021 6:40 AM, 09/18/2020 6:55 AM Patient location: Pre-op. Preanesthetic checklist: patient identified, IV checked, site marked, risks and benefits discussed, surgical consent, monitors and equipment checked, pre-op evaluation, timeout performed and anesthesia consent Position: Trendelenburg Lidocaine 1% used for infiltration and patient sedated Hand hygiene performed , maximum sterile barriers used  and Seldinger technique used Catheter size: 8.5 Fr PA cath was placed.Sheath introducer Swan type:thermodilution Procedure performed without using ultrasound guided technique. Ultrasound Notes:anatomy identified, needle tip was noted to be adjacent to the nerve/plexus identified, no ultrasound evidence of intravascular and/or intraneural injection and image(s) printed for medical record Attempts: 1 Following insertion, line sutured. Post procedure assessment: blood return through all ports  Patient tolerated the procedure well with no immediate complications.

## 2020-09-18 NOTE — Anesthesia Postprocedure Evaluation (Signed)
Anesthesia Post Note  Patient: Jeremy Reese  Procedure(s) Performed: CORONARY ARTERY BYPASS GRAFTING (CABG)X 3, ON PUMP, USING LEFT INTERAL MAMMARY ARTERY AND ENDOSCOPICALLY HARVESTED RIGHT GREATER SAPHENOUS VEIN. LIMA TO LAD, SVG TO OM, SVG TO PD (N/A Chest) CLIPPING OF ATRIAL APPENDAGE USING 75 ATRICURE LAA  EXCLUSION SYSTEM (N/A ) TRANSESOPHAGEAL ECHOCARDIOGRAM (TEE) (N/A ) ENDOVEIN HARVEST OF GREATER SAPHENOUS VEIN (Right )     Patient location during evaluation: SICU Anesthesia Type: General Level of consciousness: sedated Pain management: pain level controlled Vital Signs Assessment: post-procedure vital signs reviewed and stable Respiratory status: patient remains intubated per anesthesia plan Cardiovascular status: stable Postop Assessment: no apparent nausea or vomiting Anesthetic complications: no   No complications documented.  Last Vitals:  Vitals:   09/18/20 0427 09/18/20 1948  BP: (!) 134/103   Pulse: 71   Resp: 14   Temp: (!) 36.3 C   SpO2: 95% 95%    Last Pain:  Vitals:   09/18/20 0427  TempSrc: Oral  PainSc:                  Catalina Gravel

## 2020-09-18 NOTE — Brief Op Note (Signed)
09/12/2020 - 09/18/2020  8:54 AM  PATIENT:  Jeremy Reese  69 y.o. male  PRE-OPERATIVE DIAGNOSIS:  CAD  POST-OPERATIVE DIAGNOSIS:  CAD  PROCEDURE:  Procedure(s): CORONARY ARTERY BYPASS GRAFTING (CABG)X 3, ON PUMP, USING LEFT INTERAL MAMMARY ARTERY AND ENDOSCOPICALLY HARVESTED RIGHT GREATER SAPHENOUS VEIN. LIMA TO LAD, SVG TO OM, SVG TO PD (N/A) CLIPPING OF ATRIAL APPENDAGE USING 75 ATRICURE LAA  EXCLUSION SYSTEM (N/A) TRANSESOPHAGEAL ECHOCARDIOGRAM (TEE) (N/A) ENDOVEIN HARVEST OF GREATER SAPHENOUS VEIN (Right)   LIMA to LAD SVG to OM1 SVG to PDA  Right SVG time: 54minutes Prep: 10 minutes  SURGEON:  Surgeon(s) and Role:    Ivin Poot, MD - Primary  PHYSICIAN ASSISTANT:  Nicholes Rough, PA-C   ANESTHESIA:   general  EBL:  607 mL   BLOOD ADMINISTERED:none  DRAINS:  routine    LOCAL MEDICATIONS USED:  NONE  SPECIMEN:  No Specimen  DISPOSITION OF SPECIMEN:  N/A  COUNTS:  YES  TOURNIQUET:  * No tourniquets in log *  DICTATION: .Dragon Dictation  PLAN OF CARE: Admit to inpatient   PATIENT DISPOSITION:  ICU - intubated and hemodynamically stable.   Delay start of Pharmacological VTE agent (>24hrs) due to surgical blood loss or risk of bleeding: yes

## 2020-09-18 NOTE — Progress Notes (Signed)
Pre Procedure note for inpatients:   Jeremy Reese has been scheduled for Procedure(s): CORONARY ARTERY BYPASS GRAFTING (CABG) (N/A) CLIPPING OF ATRIAL APPENDAGE (N/A) TRANSESOPHAGEAL ECHOCARDIOGRAM (TEE) (N/A) today. The various methods of treatment have been discussed with the patient. After consideration of the risks, benefits and treatment options the patient has consented to the planned procedure.   The patient has been seen and labs reviewed. There are no changes in the patient's condition to prevent proceeding with the planned procedure today.  Recent labs:  Lab Results  Component Value Date   WBC 7.8 09/18/2020   HGB 13.3 09/18/2020   HCT 41.7 09/18/2020   PLT 151 09/18/2020   GLUCOSE 96 09/18/2020   CHOL 108 09/05/2020   TRIG 76 09/05/2020   HDL 44 09/05/2020   LDLCALC 48 09/05/2020   ALT 26 09/16/2020   AST 24 09/16/2020   NA 136 09/18/2020   K 5.0 09/18/2020   CL 100 09/18/2020   CREATININE 1.27 (H) 09/18/2020   BUN 21 09/18/2020   CO2 28 09/18/2020   TSH 2.608 09/16/2020   INR 1.0 09/16/2020   HGBA1C 6.4 (H) 09/16/2020    Len Childs, MD 09/18/2020 7:32 AM

## 2020-09-19 ENCOUNTER — Inpatient Hospital Stay (HOSPITAL_COMMUNITY): Payer: Medicare HMO

## 2020-09-19 ENCOUNTER — Ambulatory Visit: Payer: Medicare HMO | Admitting: Family

## 2020-09-19 ENCOUNTER — Encounter (HOSPITAL_COMMUNITY): Payer: Self-pay | Admitting: Cardiothoracic Surgery

## 2020-09-19 LAB — BPAM FFP
Blood Product Expiration Date: 202110232359
Blood Product Expiration Date: 202110262359
ISSUE DATE / TIME: 202110211846
ISSUE DATE / TIME: 202110211846
Unit Type and Rh: 5100
Unit Type and Rh: 6200

## 2020-09-19 LAB — MAGNESIUM
Magnesium: 2.3 mg/dL (ref 1.7–2.4)
Magnesium: 1.8 mg/dL (ref 1.7–2.4)

## 2020-09-19 LAB — POCT I-STAT 7, (LYTES, BLD GAS, ICA,H+H)
Acid-Base Excess: 2 mmol/L (ref 0.0–2.0)
Acid-Base Excess: 3 mmol/L — ABNORMAL HIGH (ref 0.0–2.0)
Acid-Base Excess: 0 mmol/L (ref 0.0–2.0)
Acid-Base Excess: 3 mmol/L — ABNORMAL HIGH (ref 0.0–2.0)
Bicarbonate: 25.7 mmol/L (ref 20.0–28.0)
Bicarbonate: 26.6 mmol/L (ref 20.0–28.0)
Bicarbonate: 25.1 mmol/L (ref 20.0–28.0)
Bicarbonate: 27.4 mmol/L (ref 20.0–28.0)
Calcium, Ion: 1.23 mmol/L (ref 1.15–1.40)
Calcium, Ion: 1.23 mmol/L (ref 1.15–1.40)
Calcium, Ion: 1.28 mmol/L (ref 1.15–1.40)
Calcium, Ion: 1.23 mmol/L (ref 1.15–1.40)
HCT: 32 % — ABNORMAL LOW (ref 39.0–52.0)
HCT: 30 % — ABNORMAL LOW (ref 39.0–52.0)
HCT: 32 % — ABNORMAL LOW (ref 39.0–52.0)
HCT: 33 % — ABNORMAL LOW (ref 39.0–52.0)
Hemoglobin: 10.9 g/dL — ABNORMAL LOW (ref 13.0–17.0)
Hemoglobin: 10.2 g/dL — ABNORMAL LOW (ref 13.0–17.0)
Hemoglobin: 10.9 g/dL — ABNORMAL LOW (ref 13.0–17.0)
Hemoglobin: 11.2 g/dL — ABNORMAL LOW (ref 13.0–17.0)
O2 Saturation: 99 %
O2 Saturation: 99 %
O2 Saturation: 97 %
O2 Saturation: 97 %
Patient temperature: 36.1
Patient temperature: 36.2
Patient temperature: 36.6
Patient temperature: 37
Potassium: 3.6 mmol/L (ref 3.5–5.1)
Potassium: 4 mmol/L (ref 3.5–5.1)
Potassium: 4.1 mmol/L (ref 3.5–5.1)
Potassium: 4 mmol/L (ref 3.5–5.1)
Sodium: 140 mmol/L (ref 135–145)
Sodium: 139 mmol/L (ref 135–145)
Sodium: 140 mmol/L (ref 135–145)
Sodium: 140 mmol/L (ref 135–145)
TCO2: 27 mmol/L (ref 22–32)
TCO2: 28 mmol/L (ref 22–32)
TCO2: 26 mmol/L (ref 22–32)
TCO2: 29 mmol/L (ref 22–32)
pCO2 arterial: 36.1 mmHg (ref 32.0–48.0)
pCO2 arterial: 36.9 mmHg (ref 32.0–48.0)
pCO2 arterial: 39.2 mmHg (ref 32.0–48.0)
pCO2 arterial: 41.4 mmHg (ref 32.0–48.0)
pH, Arterial: 7.456 — ABNORMAL HIGH (ref 7.350–7.450)
pH, Arterial: 7.464 — ABNORMAL HIGH (ref 7.350–7.450)
pH, Arterial: 7.414 (ref 7.350–7.450)
pH, Arterial: 7.429 (ref 7.350–7.450)
pO2, Arterial: 106 mmHg (ref 83.0–108.0)
pO2, Arterial: 114 mmHg — ABNORMAL HIGH (ref 83.0–108.0)
pO2, Arterial: 89 mmHg (ref 83.0–108.0)
pO2, Arterial: 85 mmHg (ref 83.0–108.0)

## 2020-09-19 LAB — CBC
HCT: 31.9 % — ABNORMAL LOW (ref 39.0–52.0)
HCT: 34.4 % — ABNORMAL LOW (ref 39.0–52.0)
Hemoglobin: 10.5 g/dL — ABNORMAL LOW (ref 13.0–17.0)
Hemoglobin: 11.1 g/dL — ABNORMAL LOW (ref 13.0–17.0)
MCH: 32 pg (ref 26.0–34.0)
MCH: 31.6 pg (ref 26.0–34.0)
MCHC: 32.9 g/dL (ref 30.0–36.0)
MCHC: 32.3 g/dL (ref 30.0–36.0)
MCV: 97.3 fL (ref 80.0–100.0)
MCV: 98 fL (ref 80.0–100.0)
Platelets: 94 10*3/uL — ABNORMAL LOW (ref 150–400)
Platelets: 116 10*3/uL — ABNORMAL LOW (ref 150–400)
RBC: 3.28 MIL/uL — ABNORMAL LOW (ref 4.22–5.81)
RBC: 3.51 MIL/uL — ABNORMAL LOW (ref 4.22–5.81)
RDW: 12.2 % (ref 11.5–15.5)
RDW: 12.7 % (ref 11.5–15.5)
WBC: 8.2 10*3/uL (ref 4.0–10.5)
WBC: 16.9 10*3/uL — ABNORMAL HIGH (ref 4.0–10.5)
nRBC: 0 % (ref 0.0–0.2)
nRBC: 0 % (ref 0.0–0.2)

## 2020-09-19 LAB — GLUCOSE, CAPILLARY
Glucose-Capillary: 135 mg/dL — ABNORMAL HIGH (ref 70–99)
Glucose-Capillary: 143 mg/dL — ABNORMAL HIGH (ref 70–99)
Glucose-Capillary: 148 mg/dL — ABNORMAL HIGH (ref 70–99)
Glucose-Capillary: 150 mg/dL — ABNORMAL HIGH (ref 70–99)
Glucose-Capillary: 151 mg/dL — ABNORMAL HIGH (ref 70–99)
Glucose-Capillary: 154 mg/dL — ABNORMAL HIGH (ref 70–99)
Glucose-Capillary: 154 mg/dL — ABNORMAL HIGH (ref 70–99)
Glucose-Capillary: 157 mg/dL — ABNORMAL HIGH (ref 70–99)
Glucose-Capillary: 162 mg/dL — ABNORMAL HIGH (ref 70–99)
Glucose-Capillary: 163 mg/dL — ABNORMAL HIGH (ref 70–99)
Glucose-Capillary: 164 mg/dL — ABNORMAL HIGH (ref 70–99)
Glucose-Capillary: 164 mg/dL — ABNORMAL HIGH (ref 70–99)
Glucose-Capillary: 166 mg/dL — ABNORMAL HIGH (ref 70–99)
Glucose-Capillary: 174 mg/dL — ABNORMAL HIGH (ref 70–99)
Glucose-Capillary: 177 mg/dL — ABNORMAL HIGH (ref 70–99)
Glucose-Capillary: 178 mg/dL — ABNORMAL HIGH (ref 70–99)
Glucose-Capillary: 180 mg/dL — ABNORMAL HIGH (ref 70–99)

## 2020-09-19 LAB — BASIC METABOLIC PANEL
Anion gap: 7 (ref 5–15)
Anion gap: 9 (ref 5–15)
BUN: 14 mg/dL (ref 8–23)
BUN: 15 mg/dL (ref 8–23)
CO2: 24 mmol/L (ref 22–32)
CO2: 25 mmol/L (ref 22–32)
Calcium: 8.9 mg/dL (ref 8.9–10.3)
Calcium: 9.1 mg/dL (ref 8.9–10.3)
Chloride: 105 mmol/L (ref 98–111)
Chloride: 100 mmol/L (ref 98–111)
Creatinine, Ser: 0.86 mg/dL (ref 0.61–1.24)
Creatinine, Ser: 1.08 mg/dL (ref 0.61–1.24)
GFR, Estimated: >60 mL/min (ref >60)
GFR, Estimated: >60 mL/min (ref >60)
Glucose, Bld: 164 mg/dL — ABNORMAL HIGH (ref 70–99)
Glucose, Bld: 187 mg/dL — ABNORMAL HIGH (ref 70–99)
Potassium: 3.6 mmol/L (ref 3.5–5.1)
Potassium: 3.6 mmol/L (ref 3.5–5.1)
Sodium: 136 mmol/L (ref 135–145)
Sodium: 134 mmol/L — ABNORMAL LOW (ref 135–145)

## 2020-09-19 LAB — PREPARE FRESH FROZEN PLASMA: Unit division: 0

## 2020-09-19 LAB — COOXEMETRY PANEL
Carboxyhemoglobin: 1.3 % (ref 0.5–1.5)
Carboxyhemoglobin: 1.1 % (ref 0.5–1.5)
Carboxyhemoglobin: 1.2 % (ref 0.5–1.5)
Methemoglobin: 0.9 % (ref 0.0–1.5)
Methemoglobin: 0.9 % (ref 0.0–1.5)
Methemoglobin: 1 % (ref 0.0–1.5)
O2 Saturation: 69.7 %
O2 Saturation: 80.2 %
O2 Saturation: 75.4 %
Total hemoglobin: 10.3 g/dL — ABNORMAL LOW (ref 12.0–16.0)
Total hemoglobin: 8.1 g/dL — ABNORMAL LOW (ref 12.0–16.0)
Total hemoglobin: 10.9 g/dL — ABNORMAL LOW (ref 12.0–16.0)

## 2020-09-19 MED ORDER — FUROSEMIDE 10 MG/ML IJ SOLN
20.0000 mg | Freq: Two times a day (BID) | INTRAMUSCULAR | Status: DC
  Administered 2020-09-19 – 2020-09-21 (×4): 20 mg via INTRAVENOUS
  Filled 2020-09-19 (×4): qty 2

## 2020-09-19 MED ORDER — HYDROMORPHONE HCL 2 MG PO TABS
2.0000 mg | ORAL_TABLET | ORAL | Status: DC | PRN
  Administered 2020-09-19 – 2020-09-22 (×12): 2 mg via ORAL
  Filled 2020-09-19 (×12): qty 1

## 2020-09-19 MED ORDER — POTASSIUM CHLORIDE 10 MEQ/50ML IV SOLN
10.0000 meq | INTRAVENOUS | Status: AC
  Administered 2020-09-19 (×3): 10 meq via INTRAVENOUS
  Filled 2020-09-19 (×3): qty 50

## 2020-09-19 MED ORDER — INSULIN ASPART 100 UNIT/ML ~~LOC~~ SOLN
0.0000 [IU] | SUBCUTANEOUS | Status: DC
  Administered 2020-09-19: 2 [IU] via SUBCUTANEOUS

## 2020-09-19 MED ORDER — INSULIN DETEMIR 100 UNIT/ML ~~LOC~~ SOLN
15.0000 [IU] | Freq: Two times a day (BID) | SUBCUTANEOUS | Status: DC
  Administered 2020-09-19 – 2020-09-20 (×3): 15 [IU] via SUBCUTANEOUS
  Filled 2020-09-19 (×5): qty 0.15

## 2020-09-19 MED ORDER — INSULIN ASPART 100 UNIT/ML ~~LOC~~ SOLN
0.0000 [IU] | SUBCUTANEOUS | Status: DC
  Administered 2020-09-19 – 2020-09-20 (×3): 4 [IU] via SUBCUTANEOUS
  Administered 2020-09-20: 2 [IU] via SUBCUTANEOUS
  Administered 2020-09-20: 4 [IU] via SUBCUTANEOUS

## 2020-09-19 MED ORDER — POTASSIUM CHLORIDE CRYS ER 20 MEQ PO TBCR
30.0000 meq | EXTENDED_RELEASE_TABLET | Freq: Once | ORAL | Status: AC
  Administered 2020-09-19: 30 meq via ORAL
  Filled 2020-09-19: qty 1

## 2020-09-19 MED ORDER — HYDRALAZINE HCL 20 MG/ML IJ SOLN
20.0000 mg | INTRAMUSCULAR | Status: DC | PRN
  Administered 2020-09-19 – 2020-09-21 (×5): 20 mg via INTRAVENOUS
  Filled 2020-09-19 (×4): qty 1

## 2020-09-19 MED ORDER — ORAL CARE MOUTH RINSE
15.0000 mL | OROMUCOSAL | Status: DC
  Administered 2020-09-19 (×5): 15 mL via OROMUCOSAL

## 2020-09-19 MED ORDER — FUROSEMIDE 10 MG/ML IJ SOLN
40.0000 mg | Freq: Two times a day (BID) | INTRAMUSCULAR | Status: DC
  Administered 2020-09-19: 40 mg via INTRAVENOUS
  Filled 2020-09-19: qty 4

## 2020-09-19 MED FILL — Sodium Bicarbonate IV Soln 8.4%: INTRAVENOUS | Qty: 50 | Status: AC

## 2020-09-19 MED FILL — Calcium Chloride Inj 10%: INTRAVENOUS | Qty: 10 | Status: AC

## 2020-09-19 MED FILL — Lidocaine HCl Local Soln Prefilled Syringe 100 MG/5ML (2%): INTRAMUSCULAR | Qty: 5 | Status: AC

## 2020-09-19 MED FILL — Mannitol IV Soln 20%: INTRAVENOUS | Qty: 500 | Status: AC

## 2020-09-19 MED FILL — Heparin Sodium (Porcine) Inj 1000 Unit/ML: INTRAMUSCULAR | Qty: 30 | Status: AC

## 2020-09-19 MED FILL — Electrolyte-R (PH 7.4) Solution: INTRAVENOUS | Qty: 6000 | Status: AC

## 2020-09-19 MED FILL — Heparin Sodium (Porcine) Inj 1000 Unit/ML: INTRAMUSCULAR | Qty: 20 | Status: AC

## 2020-09-19 MED FILL — Albumin, Human Inj 5%: INTRAVENOUS | Qty: 250 | Status: AC

## 2020-09-19 MED FILL — Sodium Chloride IV Soln 0.9%: INTRAVENOUS | Qty: 3000 | Status: AC

## 2020-09-19 NOTE — Discharge Instructions (Signed)

## 2020-09-19 NOTE — Plan of Care (Signed)
°  Problem: Activity: °Goal: Risk for activity intolerance will decrease °Outcome: Progressing °  °Problem: Elimination: °Goal: Will not experience complications related to urinary retention °Outcome: Progressing °  °Problem: Pain Managment: °Goal: General experience of comfort will improve °Outcome: Progressing °  °Problem: Safety: °Goal: Ability to remain free from injury will improve °Outcome: Progressing °  °

## 2020-09-19 NOTE — Progress Notes (Signed)
   This a.m., ECG reveals A. fib without acute ST-T wave change.  He is extubated and looking good so far.

## 2020-09-19 NOTE — Progress Notes (Signed)
Patient ID: Jeremy Reese, male   DOB: December 12, 1950, 69 y.o.   MRN: [id number] TCTS Evening Rounds:  Hemodynamically stable in chronic a fib. Milrinone 0.25  Good diuresis.  BMET    Component Value Date/Time   NA 134 (L) 09/19/2020 1807   NA 140 09/05/2020 0950   K 3.6 09/19/2020 1807   CL 100 09/19/2020 1807   CO2 25 09/19/2020 1807   GLUCOSE 187 (H) 09/19/2020 1807   BUN 15 09/19/2020 1807   BUN 13 09/05/2020 0950   CREATININE 1.08 09/19/2020 1807   CALCIUM 9.1 09/19/2020 1807   GFRNONAA >60 09/19/2020 1807   GFRAA 100 09/05/2020 0950   CBC    Component Value Date/Time   WBC 16.9 (H) 09/19/2020 1807   RBC 3.51 (L) 09/19/2020 1807   HGB 11.1 (L) 09/19/2020 1807   HGB 13.9 09/05/2020 0950   HCT 34.4 (L) 09/19/2020 1807   HCT 42.0 09/05/2020 0950   PLT 116 (L) 09/19/2020 1807   PLT 165 09/05/2020 0950   MCV 98.0 09/19/2020 1807   MCV 96 09/05/2020 0950   MCH 31.6 09/19/2020 1807   MCHC 32.3 09/19/2020 1807   RDW 12.7 09/19/2020 1807   RDW 11.6 09/05/2020 0950

## 2020-09-19 NOTE — Procedures (Signed)
Extubation Procedure Note  Patient Details:   Name: Jeremy Reese DOB: 1951/06/28 MRN: [id number]   Airway Documentation:    Vent end date: 09/19/20 Vent end time: 0900   Evaluation  O2 sats: stable throughout Complications: No apparent complications Patient did tolerate procedure well. Bilateral Breath Sounds: Clear, Diminished   Yes   Patient extubated per order/protocol to 4L [place] with no apparent complications. Positive cuff leak was noted prior to extubation. Patient achieved NIF of -25 and VC of 1.L with good effort. Patient is alert and is able to speak. Vitals are stable. RT will continue to monitor.   Zhyon Antenucci Clyda Greener 09/19/2020, 9:12 AM

## 2020-09-19 NOTE — Progress Notes (Signed)
   09/19/20 1939  Clinical Encounter Type  Visited With Patient  Visit Type Spiritual support  Referral From Nurse  Consult/Referral To Chaplain   Chaplain responded. The patient requests the Lord's Prayer to be prayed. Patient stated he had nightmare last night that he was falling into hell. Patient stated he had a tough time waking up from the dream. Patient stated he is religious although he does not attend church. The patient also asked to hear the song, "Jesus loves the little children". Chaplain played a song from [employer]. Patient also stated he is having difficulty sleeping at night and not sure if nurse has given him anything to assist. Patient stated machine beeping also keeps him awake. Chaplain asked nurse to assist. This note was prepared by Jeanine Luz, M.Div..  For questions please contact by phone [phone].

## 2020-09-19 NOTE — Hospital Course (Addendum)
History of Present Illness:  Mr. Whilden is a 69 yo male with known history of Hyperlipidemia, Carotid Stenosis, Chronic Atrial Fibrillation, and CAD.  His CAD dates back over 10 years ago when he underwent PCI to Left Cirumflex vessel.  The patient had been in his usual state of health until recently.  He presented to [facility] heart care in [place] with complaints of chest /tightness for approximately 1 month. He stated the episodes of chest pain,  felt similar to previous anginal equivalent. The pain was exertional and nonexertional; however, worse with activity. No associated dyspnea or diaphoresis. He had self increased Imdur to 2 in the morning and 2 at lunchtime, as well as 2 at bedtime for 3 weeks during the weeks leading up to his appointment. His pharmacist was reportedly concerned that he was taking too much Imdur, so he was taking 1 tablet 3 times per day for 1 week. He had not used his sublingual nitroglycerin. He reported more pain since his Imdur dose has been reduced.  It was felt the patient should undergo cardiac catheterization.  This was performed by Dr. Rockey Situ on 09/12/2020 and showed multivessel CAD.  It was felt coronary bypass grafting would be indicated and he was transferred to [facility] for evaluation.  Hospital Course:   The patient remained chest pain free during admission.   He was evaluated by Dr. Kipp Brood who felt the patient would benefit from coronary bypass procedure.  However due to scheduling the surgery would be performed by Dr. Prescott Gum.  The risks and benefits of the procedure were explained to the patient and he was agreeable to proceed.  Mr. Bonneau was taken to the operating room on 09/18/2020.  He underwent CABG x 3 utilizing LIMA to LAD, SVG to OM1, and SVG to PDA.  He had clipping of his LA appendage with a 45 mm Atricure Clip. He also underwent endoscopic harvest of greater saphenous vein from his right leg.  He tolerated the procedure without difficulty and  was taken to the SICU in stable condition.  During his stay in the SICU the patient was weaned and extubated on POD #1.  The patients chest tubes and arterial lines were removed without difficulty.  He has chronic atrial fibrillation and will be restarted on home Eliquis prior to discharge.  He was weaned off Milrinone on 09/20/2020.  He was started on diuretics for volume overload state.  He was hypertensive and his Coreg dose was titrated to 25 mg BID.  He remained in rate controlled Atrial Fibrillation.  He was medically stable for transfer to the progressive care unit.  Patient had issues with pain control.  He uses pain medication chronically prior to admission and his home regimen was restarted.  His pacing wires were removed without difficulty.  He is ambulating independently.  His incisions are healing without evidence of infection.  He is medically stable for discharge home today.

## 2020-09-19 NOTE — Plan of Care (Signed)
°  Problem: Clinical Measurements: Goal: Respiratory complications will improve Outcome: Progressing Goal: Cardiovascular complication will be avoided Outcome: Progressing   Problem: Elimination: Goal: Will not experience complications related to bowel motility Outcome: Progressing   

## 2020-09-19 NOTE — Progress Notes (Signed)
This note also relates to the following rows which could not be included: SpO2 - Cannot attach notes to unvalidated device data  RT NOTE: Rapid weaning protocol

## 2020-09-19 NOTE — Progress Notes (Signed)
1 Day Post-Op Procedure(s) (LRB): CORONARY ARTERY BYPASS GRAFTING (CABG)X 3, ON PUMP, USING LEFT INTERAL MAMMARY ARTERY AND ENDOSCOPICALLY HARVESTED RIGHT GREATER SAPHENOUS VEIN. LIMA TO LAD, SVG TO OM, SVG TO PD (N/A) CLIPPING OF ATRIAL APPENDAGE USING 45 ATRICURE LAA  EXCLUSION SYSTEM (N/A) TRANSESOPHAGEAL ECHOCARDIOGRAM (TEE) (N/A) ENDOVEIN HARVEST OF GREATER SAPHENOUS VEIN (Right) Subjective: Being weaned from vent chronic afib- resume eliquis later Hemodynamics stable  Objective: Vital signs in last 24 hours: Temp:  [96.4 F (35.8 C)-97.5 F (36.4 C)] 97.5 F (36.4 C) (10/22 0756) Pulse Rate:  [81-152] 98 (10/22 0756) Cardiac Rhythm: A-V Sequential paced (10/22 0000) Resp:  [12-18] 18 (10/22 0756) BP: (97-147)/(63-74) 147/74 (10/22 0756) SpO2:  [92 %-100 %] 98 % (10/22 0826) Arterial Line BP: (102-209)/(55-113) 158/76 (10/22 0700) FiO2 (%):  [40 %-50 %] 40 % (10/22 0826)  Hemodynamic parameters for last 24 hours: PAP: (20-33)/(13-22) 32/22 CO:  [3.6 L/min-5.2 L/min] 5.2 L/min CI:  [1.6 L/min/m2-2.2 L/min/m2] 2.2 L/min/m2  Intake/Output from previous day: 10/21 0701 - 10/22 0700 In: 6774.5 [I.V.:4855; Blood:912; IV Piggyback:1007.6] Out: 6932 [Urine:5805; Blood:607; Chest Tube:520] Intake/Output this shift: No intake/output data recorded.       Exam    General- alert and comfortable    Neck- no JVD, no cervical adenopathy palpable, no carotid bruit   Lungs- clear without rales, wheezes   Cor- regular rate and rhythm, no murmur , gallop   Abdomen- soft, non-tender   Extremities - warm, non-tender, minimal edema   Neuro- oriented, appropriate, no focal weakness   Lab Results: Recent Labs    09/18/20 2001 09/18/20 2135 09/19/20 0413 09/19/20 0610  WBC 10.0  --  8.2  --   HGB 12.2*   < > 10.5* 10.2*  HCT 37.3*   < > 31.9* 30.0*  PLT 95*  --  94*  --    < > = values in this interval not displayed.   BMET:  Recent Labs    09/18/20 0115 09/18/20 1315  09/18/20 1810 09/18/20 2000 09/19/20 0413 09/19/20 0610  NA 136   < > 138   < > 136 139  K 5.0   < > 4.9   < > 3.6 4.0  CL 100   < > 98  --  105  --   CO2 28  --   --   --  24  --   GLUCOSE 96   < > 125*  --  164*  --   BUN 21   < > 14  --  14  --   CREATININE 1.27*   < > 0.60*  --  0.86  --   CALCIUM 9.3  --   --   --  8.9  --    < > = values in this interval not displayed.    PT/INR:  Recent Labs    09/18/20 2001  LABPROT 16.6*  INR 1.4*   ABG    Component Value Date/Time   PHART 7.464 (H) 09/19/2020 0610   HCO3 26.6 09/19/2020 0610   TCO2 28 09/19/2020 0610   O2SAT 99.0 09/19/2020 0610   CBG (last 3)  Recent Labs    09/19/20 0608 09/19/20 0657 09/19/20 0805  GLUCAP 178* 162* 135*    Assessment/Plan: S/P Procedure(s) (LRB): CORONARY ARTERY BYPASS GRAFTING (CABG)X 3, ON PUMP, USING LEFT INTERAL MAMMARY ARTERY AND ENDOSCOPICALLY HARVESTED RIGHT GREATER SAPHENOUS VEIN. LIMA TO LAD, SVG TO OM, SVG TO PD (N/A) CLIPPING OF ATRIAL APPENDAGE USING 45 ATRICURE  LAA  EXCLUSION SYSTEM (N/A) TRANSESOPHAGEAL ECHOCARDIOGRAM (TEE) (N/A) ENDOVEIN HARVEST OF GREATER SAPHENOUS VEIN (Right) Mobilize Diuresis Diabetes control See progression orders   LOS: 7 days    Jeremy Reese 09/19/2020

## 2020-09-20 ENCOUNTER — Inpatient Hospital Stay (HOSPITAL_COMMUNITY): Payer: Medicare HMO

## 2020-09-20 LAB — BPAM RBC
Blood Product Expiration Date: 202111022359
Blood Product Expiration Date: 202111052359
Blood Product Expiration Date: 202111052359
Blood Product Expiration Date: 202111102359
ISSUE DATE / TIME: 202110180951
ISSUE DATE / TIME: 202110180951
Unit Type and Rh: 9500
Unit Type and Rh: 9500
Unit Type and Rh: 9500
Unit Type and Rh: 9500

## 2020-09-20 LAB — CBC
HCT: 34.8 % — ABNORMAL LOW (ref 39.0–52.0)
Hemoglobin: 11.5 g/dL — ABNORMAL LOW (ref 13.0–17.0)
MCH: 32.7 pg (ref 26.0–34.0)
MCHC: 33 g/dL (ref 30.0–36.0)
MCV: 98.9 fL (ref 80.0–100.0)
Platelets: 123 10*3/uL — ABNORMAL LOW (ref 150–400)
RBC: 3.52 MIL/uL — ABNORMAL LOW (ref 4.22–5.81)
RDW: 13.1 % (ref 11.5–15.5)
WBC: 19.1 10*3/uL — ABNORMAL HIGH (ref 4.0–10.5)
nRBC: 0 % (ref 0.0–0.2)

## 2020-09-20 LAB — TYPE AND SCREEN
ABO/RH(D): O NEG
Antibody Screen: NEGATIVE
Unit division: 0
Unit division: 0
Unit division: 0
Unit division: 0

## 2020-09-20 LAB — BASIC METABOLIC PANEL
Anion gap: 10 (ref 5–15)
BUN: 18 mg/dL (ref 8–23)
CO2: 24 mmol/L (ref 22–32)
Calcium: 9.7 mg/dL (ref 8.9–10.3)
Chloride: 100 mmol/L (ref 98–111)
Creatinine, Ser: 0.96 mg/dL (ref 0.61–1.24)
GFR, Estimated: >60 mL/min (ref >60)
Glucose, Bld: 243 mg/dL — ABNORMAL HIGH (ref 70–99)
Potassium: 3.9 mmol/L (ref 3.5–5.1)
Sodium: 134 mmol/L — ABNORMAL LOW (ref 135–145)

## 2020-09-20 LAB — GLUCOSE, CAPILLARY
Glucose-Capillary: 110 mg/dL — ABNORMAL HIGH (ref 70–99)
Glucose-Capillary: 119 mg/dL — ABNORMAL HIGH (ref 70–99)
Glucose-Capillary: 122 mg/dL — ABNORMAL HIGH (ref 70–99)
Glucose-Capillary: 135 mg/dL — ABNORMAL HIGH (ref 70–99)
Glucose-Capillary: 161 mg/dL — ABNORMAL HIGH (ref 70–99)
Glucose-Capillary: 178 mg/dL — ABNORMAL HIGH (ref 70–99)
Glucose-Capillary: 239 mg/dL — ABNORMAL HIGH (ref 70–99)

## 2020-09-20 LAB — COOXEMETRY PANEL
Carboxyhemoglobin: 1.1 % (ref 0.5–1.5)
Methemoglobin: 0.6 % (ref 0.0–1.5)
O2 Saturation: 74.3 %
Total hemoglobin: 11.6 g/dL — ABNORMAL LOW (ref 12.0–16.0)

## 2020-09-20 MED ORDER — INSULIN DETEMIR 100 UNIT/ML ~~LOC~~ SOLN
20.0000 [IU] | Freq: Two times a day (BID) | SUBCUTANEOUS | Status: DC
  Administered 2020-09-20 – 2020-09-21 (×2): 20 [IU] via SUBCUTANEOUS
  Filled 2020-09-20 (×5): qty 0.2

## 2020-09-20 MED ORDER — CARVEDILOL 12.5 MG PO TABS
12.5000 mg | ORAL_TABLET | Freq: Two times a day (BID) | ORAL | Status: DC
  Administered 2020-09-20 – 2020-09-21 (×2): 12.5 mg via ORAL
  Filled 2020-09-20 (×2): qty 1

## 2020-09-20 MED ORDER — POTASSIUM CHLORIDE CRYS ER 20 MEQ PO TBCR
20.0000 meq | EXTENDED_RELEASE_TABLET | Freq: Two times a day (BID) | ORAL | Status: DC
  Administered 2020-09-20 – 2020-09-23 (×7): 20 meq via ORAL
  Filled 2020-09-20 (×7): qty 1

## 2020-09-20 NOTE — Progress Notes (Signed)
2 Days Post-Op Procedure(s) (LRB): CORONARY ARTERY BYPASS GRAFTING (CABG)X 3, ON PUMP, USING LEFT INTERAL MAMMARY ARTERY AND ENDOSCOPICALLY HARVESTED RIGHT GREATER SAPHENOUS VEIN. LIMA TO LAD, SVG TO OM, SVG TO PD (N/A) CLIPPING OF ATRIAL APPENDAGE USING 45 ATRICURE LAA  EXCLUSION SYSTEM (N/A) TRANSESOPHAGEAL ECHOCARDIOGRAM (TEE) (N/A) ENDOVEIN HARVEST OF GREATER SAPHENOUS VEIN (Right) Subjective: No complaints  Objective: Vital signs in last 24 hours: Temp:  [97.9 F (36.6 C)-99.1 F (37.3 C)] 98.3 F (36.8 C) (10/23 1200) Pulse Rate:  [50-149] 149 (10/23 1400) Cardiac Rhythm: Atrial fibrillation (10/23 1200) Resp:  [16-26] 26 (10/23 1400) BP: (125-187)/(63-112) 159/106 (10/23 1300) SpO2:  [92 %-97 %] 95 % (10/23 1400) Arterial Line BP: (190)/(82) 190/82 (10/22 1500) Weight:  [116.8 kg] 116.8 kg (10/23 0630)  Hemodynamic parameters for last 24 hours:    Intake/Output from previous day: 10/22 0701 - 10/23 0700 In: 752.4 [I.V.:476; IV Piggyback:276.4] Out: 2995 [Urine:2515; Chest Tube:480] Intake/Output this shift: Total I/O In: 59.4 [I.V.:59.4] Out: 645 [Urine:575; Chest Tube:70]  General appearance: alert and cooperative Neurologic: intact Heart: regular rate and rhythm, S1, S2 normal, no murmur Lungs: clear to auscultation bilaterally Extremities: edema mild Wound: dressing dry  Lab Results: Recent Labs    09/19/20 1807 09/20/20 0350  WBC 16.9* 19.1*  HGB 11.1* 11.5*  HCT 34.4* 34.8*  PLT 116* 123*   BMET:  Recent Labs    09/19/20 1807 09/20/20 0350  NA 134* 134*  K 3.6 3.9  CL 100 100  CO2 25 24  GLUCOSE 187* 243*  BUN 15 18  CREATININE 1.08 0.96  CALCIUM 9.1 9.7    PT/INR:  Recent Labs    09/18/20 2001  LABPROT 16.6*  INR 1.4*   ABG    Component Value Date/Time   PHART 7.429 09/19/2020 0959   HCO3 27.4 09/19/2020 0959   TCO2 29 09/19/2020 0959   O2SAT 74.3 09/20/2020 0350   CBG (last 3)  Recent Labs    09/20/20 0409 09/20/20 0654  09/20/20 1118  GLUCAP 161* 178* 135*   CXR: ok  Assessment/Plan: S/P Procedure(s) (LRB): CORONARY ARTERY BYPASS GRAFTING (CABG)X 3, ON PUMP, USING LEFT INTERAL MAMMARY ARTERY AND ENDOSCOPICALLY HARVESTED RIGHT GREATER SAPHENOUS VEIN. LIMA TO LAD, SVG TO OM, SVG TO PD (N/A) CLIPPING OF ATRIAL APPENDAGE USING 45 ATRICURE LAA  EXCLUSION SYSTEM (N/A) TRANSESOPHAGEAL ECHOCARDIOGRAM (TEE) (N/A) ENDOVEIN HARVEST OF GREATER SAPHENOUS VEIN (Right)  POD 2 Hemodynamically stable in atrial fib low 100's. Co-ox is 74%. DC milrinone. Start Coreg.  Volume excess: wt is 7 lbs over preop. Continue diuresis and KCL  DM: glucose still a little high. Increase Levemir.  DC chest tubes.  IS, ambulation  Plan to resume Eliquis for chronic atrial fib once pacing wires removed.   LOS: 8 days    Gaye Pollack 09/20/2020

## 2020-09-20 NOTE — Progress Notes (Signed)
Patient ID: Jeremy Reese, male   DOB: Apr 08, 1951, 69 y.o.   MRN: [id number] TCTS Evening Rounds:  Hemodynamically stable off milrinone  Urine output good.  sats 97% on RA.

## 2020-09-21 LAB — CBC
HCT: 37.1 % — ABNORMAL LOW (ref 39.0–52.0)
Hemoglobin: 11.9 g/dL — ABNORMAL LOW (ref 13.0–17.0)
MCH: 31.8 pg (ref 26.0–34.0)
MCHC: 32.1 g/dL (ref 30.0–36.0)
MCV: 99.2 fL (ref 80.0–100.0)
Platelets: 143 10*3/uL — ABNORMAL LOW (ref 150–400)
RBC: 3.74 MIL/uL — ABNORMAL LOW (ref 4.22–5.81)
RDW: 13.4 % (ref 11.5–15.5)
WBC: 20.5 10*3/uL — ABNORMAL HIGH (ref 4.0–10.5)
nRBC: 0 % (ref 0.0–0.2)

## 2020-09-21 LAB — GLUCOSE, CAPILLARY
Glucose-Capillary: 119 mg/dL — ABNORMAL HIGH (ref 70–99)
Glucose-Capillary: 126 mg/dL — ABNORMAL HIGH (ref 70–99)
Glucose-Capillary: 142 mg/dL — ABNORMAL HIGH (ref 70–99)
Glucose-Capillary: 85 mg/dL (ref 70–99)
Glucose-Capillary: 118 mg/dL — ABNORMAL HIGH (ref 70–99)

## 2020-09-21 LAB — BASIC METABOLIC PANEL
Anion gap: 12 (ref 5–15)
BUN: 28 mg/dL — ABNORMAL HIGH (ref 8–23)
CO2: 24 mmol/L (ref 22–32)
Calcium: 9.9 mg/dL (ref 8.9–10.3)
Chloride: 99 mmol/L (ref 98–111)
Creatinine, Ser: 1.08 mg/dL (ref 0.61–1.24)
GFR, Estimated: >60 mL/min (ref >60)
Glucose, Bld: 129 mg/dL — ABNORMAL HIGH (ref 70–99)
Potassium: 4.4 mmol/L (ref 3.5–5.1)
Sodium: 135 mmol/L (ref 135–145)

## 2020-09-21 MED ORDER — ONDANSETRON HCL 4 MG PO TABS: 4.0000 mg | ORAL_TABLET | Freq: Four times a day (QID) | ORAL | Status: DC | PRN

## 2020-09-21 MED ORDER — SODIUM CHLORIDE 0.9% FLUSH
3.0000 mL | Freq: Two times a day (BID) | INTRAVENOUS | Status: DC
  Administered 2020-09-21 – 2020-09-23 (×4): 3 mL via INTRAVENOUS

## 2020-09-21 MED ORDER — ASPIRIN EC 325 MG PO TBEC: 325.0000 mg | DELAYED_RELEASE_TABLET | Freq: Every day | ORAL | Status: DC

## 2020-09-21 MED ORDER — PANTOPRAZOLE SODIUM 40 MG PO TBEC
40.0000 mg | DELAYED_RELEASE_TABLET | Freq: Every day | ORAL | Status: DC
  Administered 2020-09-22 – 2020-09-23 (×2): 40 mg via ORAL
  Filled 2020-09-21 (×2): qty 1

## 2020-09-21 MED ORDER — INSULIN ASPART 100 UNIT/ML ~~LOC~~ SOLN: 0.0000 [IU] | Freq: Three times a day (TID) | SUBCUTANEOUS | Status: DC

## 2020-09-21 MED ORDER — ASPIRIN EC 325 MG PO TBEC
325.0000 mg | DELAYED_RELEASE_TABLET | Freq: Every day | ORAL | Status: DC
  Administered 2020-09-22 – 2020-09-23 (×2): 325 mg via ORAL
  Filled 2020-09-21 (×2): qty 1

## 2020-09-21 MED ORDER — FUROSEMIDE 40 MG PO TABS
40.0000 mg | ORAL_TABLET | Freq: Every day | ORAL | Status: DC
  Administered 2020-09-22 – 2020-09-23 (×2): 40 mg via ORAL
  Filled 2020-09-21 (×2): qty 1

## 2020-09-21 MED ORDER — SODIUM CHLORIDE 0.9 % IV SOLN: 250.0000 mL | INTRAVENOUS | Status: DC | PRN

## 2020-09-21 MED ORDER — ~~LOC~~ CARDIAC SURGERY, PATIENT & FAMILY EDUCATION: Freq: Once | Status: AC

## 2020-09-21 MED ORDER — ONDANSETRON HCL 4 MG/2ML IJ SOLN
4.0000 mg | Freq: Four times a day (QID) | INTRAMUSCULAR | Status: DC | PRN
  Administered 2020-09-21: 4 mg via INTRAVENOUS
  Filled 2020-09-21: qty 2

## 2020-09-21 MED ORDER — SODIUM CHLORIDE 0.9% FLUSH
3.0000 mL | INTRAVENOUS | Status: DC | PRN
  Administered 2020-09-22: 3 mL via INTRAVENOUS

## 2020-09-21 MED ORDER — INSULIN ASPART 100 UNIT/ML ~~LOC~~ SOLN
0.0000 [IU] | Freq: Three times a day (TID) | SUBCUTANEOUS | Status: DC
  Administered 2020-09-21 (×2): 2 [IU] via SUBCUTANEOUS

## 2020-09-21 MED ORDER — CARVEDILOL 25 MG PO TABS
25.0000 mg | ORAL_TABLET | Freq: Two times a day (BID) | ORAL | Status: DC
  Administered 2020-09-21 – 2020-09-23 (×5): 25 mg via ORAL
  Filled 2020-09-21 (×5): qty 1

## 2020-09-21 MED ORDER — CHLORHEXIDINE GLUCONATE CLOTH 2 % EX PADS
6.0000 | MEDICATED_PAD | Freq: Every day | CUTANEOUS | Status: DC
  Administered 2020-09-21 – 2020-09-23 (×3): 6 via TOPICAL

## 2020-09-21 MED ORDER — POLYETHYLENE GLYCOL 3350 17 G PO PACK
17.0000 g | PACK | Freq: Every day | ORAL | Status: DC
  Administered 2020-09-21 – 2020-09-22 (×2): 17 g via ORAL
  Filled 2020-09-21 (×3): qty 1

## 2020-09-21 MED ORDER — TRAMADOL HCL 50 MG PO TABS
50.0000 mg | ORAL_TABLET | ORAL | Status: DC | PRN
  Administered 2020-09-22 (×2): 50 mg via ORAL
  Filled 2020-09-21 (×2): qty 1

## 2020-09-21 NOTE — Progress Notes (Signed)
3 Days Post-Op Procedure(s) (LRB): CORONARY ARTERY BYPASS GRAFTING (CABG)X 3, ON PUMP, USING LEFT INTERAL MAMMARY ARTERY AND ENDOSCOPICALLY HARVESTED RIGHT GREATER SAPHENOUS VEIN. LIMA TO LAD, SVG TO OM, SVG TO PD (N/A) CLIPPING OF ATRIAL APPENDAGE USING 45 ATRICURE LAA  EXCLUSION SYSTEM (N/A) TRANSESOPHAGEAL ECHOCARDIOGRAM (TEE) (N/A) ENDOVEIN HARVEST OF GREATER SAPHENOUS VEIN (Right) Subjective: No complaints.  Objective: Vital signs in last 24 hours: Temp:  [97.6 F (36.4 C)-98.3 F (36.8 C)] 97.6 F (36.4 C) (10/24 0739) Pulse Rate:  [48-149] 69 (10/24 0800) Cardiac Rhythm: Atrial fibrillation (10/24 0400) Resp:  [18-28] 24 (10/24 0800) BP: (114-210)/(63-136) 152/95 (10/24 0800) SpO2:  [94 %-98 %] 98 % (10/24 0800) Weight:  [235 kg] 116 kg (10/24 0500)  Hemodynamic parameters for last 24 hours:    Intake/Output from previous day: 10/23 0701 - 10/24 0700 In: 321.2 [P.O.:240; I.V.:81.2] Out: 1575 [Urine:1455; Chest Tube:120] Intake/Output this shift: Total I/O In: 60 [P.O.:60] Out: 200 [Urine:200]  General appearance: alert and cooperative Neurologic: intact Heart: irregularly irregular rhythm Lungs: clear to auscultation bilaterally Extremities: edema mild Wound: Prevena dressing in place  Lab Results: Recent Labs    09/20/20 0350 09/21/20 0113  WBC 19.1* 20.5*  HGB 11.5* 11.9*  HCT 34.8* 37.1*  PLT 123* 143*   BMET:  Recent Labs    09/20/20 0350 09/21/20 0113  NA 134* 135  K 3.9 4.4  CL 100 99  CO2 24 24  GLUCOSE 243* 129*  BUN 18 28*  CREATININE 0.96 1.08  CALCIUM 9.7 9.9    PT/INR:  Recent Labs    09/18/20 2001  LABPROT 16.6*  INR 1.4*   ABG    Component Value Date/Time   PHART 7.429 09/19/2020 0959   HCO3 27.4 09/19/2020 0959   TCO2 29 09/19/2020 0959   O2SAT 74.3 09/20/2020 0350   CBG (last 3)  Recent Labs    09/20/20 2348 09/21/20 0356 09/21/20 0734  GLUCAP 122* 119* 126*    Assessment/Plan: S/P Procedure(s)  (LRB): CORONARY ARTERY BYPASS GRAFTING (CABG)X 3, ON PUMP, USING LEFT INTERAL MAMMARY ARTERY AND ENDOSCOPICALLY HARVESTED RIGHT GREATER SAPHENOUS VEIN. LIMA TO LAD, SVG TO OM, SVG TO PD (N/A) CLIPPING OF ATRIAL APPENDAGE USING 45 ATRICURE LAA  EXCLUSION SYSTEM (N/A) TRANSESOPHAGEAL ECHOCARDIOGRAM (TEE) (N/A) ENDOVEIN HARVEST OF GREATER SAPHENOUS VEIN (Right)  POD 3  Hemodynamically stable in atrial fib low 100's. Some HTN overnight requiring prn hydralazine. Will increase Coreg to 25 bid.  Volume excess: wt is 6 lbs over preop. Continue diuresis and KCL  DM: glucose under good control Continue Levemir and SSI.  IS, ambulation  Plan to resume Eliquis for chronic atrial fib once pacing wires removed.  Transfer to 4E.   LOS: 9 days    Gaye Pollack 09/21/2020

## 2020-09-21 NOTE — Progress Notes (Signed)
Pt ambulated about 80 ft in hallway with rolling walker. Pt tolerated fair. Pt complained of being "tired" and requested to go back to room during walk. Pt returned to chair to order dinner. Call light and phone within reach.

## 2020-09-21 NOTE — Progress Notes (Signed)
Pt arrived to 4e from 2h. Pt oriented to room and staff. Vitals obtained. Telemetry box applied and CCMD notified. Wound vac to sternum. Condom cath in place. Will continue current plan of care.

## 2020-09-22 LAB — BASIC METABOLIC PANEL
Anion gap: 10 (ref 5–15)
BUN: 41 mg/dL — ABNORMAL HIGH (ref 8–23)
CO2: 26 mmol/L (ref 22–32)
Calcium: 9.4 mg/dL (ref 8.9–10.3)
Chloride: 98 mmol/L (ref 98–111)
Creatinine, Ser: 1.05 mg/dL (ref 0.61–1.24)
GFR, Estimated: >60 mL/min (ref >60)
Glucose, Bld: 91 mg/dL (ref 70–99)
Potassium: 4.2 mmol/L (ref 3.5–5.1)
Sodium: 134 mmol/L — ABNORMAL LOW (ref 135–145)

## 2020-09-22 LAB — GLUCOSE, CAPILLARY
Glucose-Capillary: 91 mg/dL (ref 70–99)
Glucose-Capillary: 116 mg/dL — ABNORMAL HIGH (ref 70–99)
Glucose-Capillary: 166 mg/dL — ABNORMAL HIGH (ref 70–99)
Glucose-Capillary: 108 mg/dL — ABNORMAL HIGH (ref 70–99)

## 2020-09-22 LAB — CBC
HCT: 35.7 % — ABNORMAL LOW (ref 39.0–52.0)
Hemoglobin: 11.3 g/dL — ABNORMAL LOW (ref 13.0–17.0)
MCH: 31.4 pg (ref 26.0–34.0)
MCHC: 31.7 g/dL (ref 30.0–36.0)
MCV: 99.2 fL (ref 80.0–100.0)
Platelets: 177 10*3/uL (ref 150–400)
RBC: 3.6 MIL/uL — ABNORMAL LOW (ref 4.22–5.81)
RDW: 13.1 % (ref 11.5–15.5)
WBC: 15.8 10*3/uL — ABNORMAL HIGH (ref 4.0–10.5)
nRBC: 0 % (ref 0.0–0.2)

## 2020-09-22 MED ORDER — TRAMADOL HCL 50 MG PO TABS
100.0000 mg | ORAL_TABLET | ORAL | Status: DC | PRN
  Administered 2020-09-22 – 2020-09-23 (×7): 100 mg via ORAL
  Filled 2020-09-22 (×7): qty 2

## 2020-09-22 MED ORDER — APIXABAN 5 MG PO TABS: 5.0000 mg | ORAL_TABLET | Freq: Two times a day (BID) | ORAL | Status: DC

## 2020-09-22 MED ORDER — ENOXAPARIN SODIUM 40 MG/0.4ML ~~LOC~~ SOLN: 40.0000 mg | SUBCUTANEOUS | Status: DC

## 2020-09-22 MED ORDER — HYDROMORPHONE HCL 2 MG PO TABS
2.0000 mg | ORAL_TABLET | ORAL | Status: DC | PRN
Start: 2020-09-22 — End: 2020-09-22

## 2020-09-22 MED ORDER — GABAPENTIN 400 MG PO CAPS
400.0000 mg | ORAL_CAPSULE | Freq: Three times a day (TID) | ORAL | Status: DC
  Administered 2020-09-22 – 2020-09-23 (×5): 400 mg via ORAL
  Filled 2020-09-22 (×5): qty 1

## 2020-09-22 MED ORDER — APIXABAN 5 MG PO TABS
5.0000 mg | ORAL_TABLET | Freq: Two times a day (BID) | ORAL | Status: DC
  Administered 2020-09-23: 5 mg via ORAL
  Filled 2020-09-22: qty 1

## 2020-09-22 NOTE — Addendum Note (Signed)
Addendum  created 09/22/20 0813 by Josephine Igo, CRNA   Order list changed

## 2020-09-22 NOTE — Progress Notes (Signed)
Patient ambulated in hall with assistance from the nurse. Gilford Rile was used and patient tolerated okay. Had to stop to rest two times in a 100 foot walk. Patient complaint of pain but abilities to ambulate on own are improving. Patient given pain medication upon return to the room.  Donnelly Angelica, RN

## 2020-09-22 NOTE — Progress Notes (Signed)
Pacer wires pulled per protocol and Physician order without complication. Patient placed on bedrest for 1 hour. Vital signs taken and charted. Patient left to rest, Will continue to monitor.   Donnelly Angelica, RN

## 2020-09-22 NOTE — Progress Notes (Signed)
Mobility Specialist: Progress Note   09/22/20 1631  Mobility  Activity Ambulated in hall  Level of Assistance Moderate assist, patient does 50-74%  Assistive Device Four wheel walker (EVA Walker)  Distance Ambulated (ft) 220 ft  Mobility Response Tolerated fair  Mobility performed by Mobility specialist  Bed Position Chair  $Mobility charge 1 Mobility   Pre-Mobility: 101 HR, 104/67 BP, 99% SpO2 Post-Mobility: 111 HR, 107/80 BP, 96% SpO2  Pt stopped to take 3 standing rest breaks lasting a few seconds each with c/o feeling SOB. Encouraged pt to walk one more time today.   [facility] Jeremy Reese Mobility Specialist

## 2020-09-22 NOTE — Progress Notes (Signed)
Progress Note  Patient Name: Jeremy Reese Date of Encounter: 09/22/2020  [facility] Cardiologist: Ida Rogue, MD   Subjective   Just ambulated a short distance with CR, having a lot of chronic back pain.   Inpatient Medications    Scheduled Meds: . apixaban  5 mg Oral BID  . aspirin EC  325 mg Oral Daily  . carvedilol  25 mg Oral BID WC  . Chlorhexidine Gluconate Cloth  6 each Topical Daily  . ezetimibe  10 mg Oral Daily  . furosemide  40 mg Oral Daily  . gabapentin  400 mg Oral TID  . influenza vaccine adjuvanted  0.5 mL Intramuscular Tomorrow-1000  . pantoprazole  40 mg Oral QAC breakfast  . polyethylene glycol  17 g Oral Daily  . potassium chloride  20 mEq Oral BID  . rosuvastatin  40 mg Oral Daily  . sodium chloride flush  3 mL Intravenous Q12H   Continuous Infusions: . sodium chloride     PRN Meds: sodium chloride, ondansetron **OR** ondansetron (ZOFRAN) IV, sodium chloride flush, traMADol   Vital Signs    Vitals:   09/22/20 0836 09/22/20 0900 09/22/20 0905 09/22/20 0943  BP: (!) 139/92 112/74 112/74   Pulse: 99 99 99   Resp:  20 19 19   Temp:  97.6 F (36.4 C) 97.6 F (36.4 C)   TempSrc:  Oral    SpO2:  96%    Weight:      Height:        Intake/Output Summary (Last 24 hours) at 09/22/2020 1044 Last data filed at 09/22/2020 1025 Gross per 24 hour  Intake 240 ml  Output 475 ml  Net -235 ml   Last 3 Weights 09/22/2020 09/21/2020 09/20/2020  Weight (lbs) 254 lb 10.1 oz 255 lb 11.7 oz 257 lb 8 oz  Weight (kg) 115.5 kg 116 kg 116.8 kg      Telemetry    Afib rates 90-120s elevated while ambulating - Personally Reviewed  ECG    No new tracing.   Physical Exam  Pleasant older male, laying in bed GEN: No acute distress.   Neck: No JVD Cardiac: Irreg Irreg, no murmurs, rubs, or gallops.  Respiratory: Clear to auscultation bilaterally. GI: Soft, nontender, non-distended  MS: 1-2+ LE edema bilaterally; No deformity. Neuro:   Nonfocal  Psych: Normal affect   Labs    High Sensitivity Troponin:  No results for input(s): TROPONINIHS in the last 720 hours.    Chemistry Recent Labs  Lab 09/16/20 0603 09/18/20 0115 09/20/20 0350 09/21/20 0113 09/22/20 0415  NA 137   < > 134* 135 134*  K 4.1   < > 3.9 4.4 4.2  CL 103   < > 100 99 98  CO2 24   < > 24 24 26   GLUCOSE 114*   < > 243* 129* 91  BUN 16   < > 18 28* 41*  CREATININE 0.97   < > 0.96 1.08 1.05  CALCIUM 9.5   < > 9.7 9.9 9.4  PROT 6.7  --   --   --   --   ALBUMIN 3.9  --   --   --   --   AST 24  --   --   --   --   ALT 26  --   --   --   --   ALKPHOS 52  --   --   --   --   BILITOT 0.9  --   --   --   --  GFRNONAA >60   < > >60 >60 >60  ANIONGAP 10   < > 10 12 10    < > = values in this interval not displayed.     Hematology Recent Labs  Lab 09/20/20 0350 09/21/20 0113 09/22/20 0415  WBC 19.1* 20.5* 15.8*  RBC 3.52* 3.74* 3.60*  HGB 11.5* 11.9* 11.3*  HCT 34.8* 37.1* 35.7*  MCV 98.9 99.2 99.2  MCH 32.7 31.8 31.4  MCHC 33.0 32.1 31.7  RDW 13.1 13.4 13.1  PLT 123* 143* 177    BNPNo results for input(s): BNP, PROBNP in the last 168 hours.   DDimer No results for input(s): DDIMER in the last 168 hours.   Radiology    No results found.  Cardiac Studies   Cath: 09/12/20  Diagnostic Dominance: Right   Echo: 09/13/20  IMPRESSIONS    1. Left ventricular ejection fraction, by estimation, is 60 to 65%. The  left ventricle has normal function. The left ventricle has no regional  wall motion abnormalities. There is moderate left ventricular hypertrophy.  Left ventricular diastolic  parameters are indeterminate.  2. Right ventricular systolic function is normal. The right ventricular  size is normal.  3. Left atrial size was severely dilated.  4. Right atrial size was mildly dilated.  5. The mitral valve is grossly normal. Mild mitral valve regurgitation.  No evidence of mitral stenosis. Moderate mitral annular  calcification.  6. The aortic valve is abnormal. There is severe calcifcation of the  aortic valve. Aortic valve regurgitation is not visualized. Aortic valve  sclerosis/calcification is present, without any evidence of aortic  stenosis. Mean systolic gradient 9 mmHg.   Patient Profile     69 y.o. male with a history of CAD s/p PCI LCx 2010, TIA (2012), bilateral occlusion of ICAmonitored by Dr. Lucky Cowboy, permanent atrial fibrillation, HLD,chronic back painpresented with chest pain found to have multivessel CAD on [facility]who [facility].  Assessment & Plan    1. CAD: s/p 3vCABG with Dr. Prescott Gum. Walked briefly with cardiac rehab this morning, but limited with chronic back pain. He is treated with ASA, statin, BB, Zetia.    2. Chronic Afib: underwent left atrial appendage clipping. Rates between the 90-120 range with activity. Currently on coreg 25mg  BID -- Eliquis to be started this evening, EP wires being removed this morning by RN -- reduce ASA to 81mg  daily  3. HTN: controlled with coreg 25mg  BID  4. HLD: on crestor 40mg  daily, along with Zetia 10mg  daily  5. Chronic pain: home medication regimen has been adjusted per primary.   Possible DC home tomorrow. Will arrange for follow up appt in [place] with Dr. Adora Fridge.   For questions or updates, please contact [place] Please consult [email or web address] for contact info under    Signed, Reino Bellis, NP  09/22/2020, 10:44 AM

## 2020-09-22 NOTE — Progress Notes (Signed)
   09/22/20 0100  Clinical Encounter Type  Visited With Patient  Visit Type Social support  Referral From Nurse  Consult/Referral To [facility]   The chaplain responded to page to the unit. The patient recently has a heart procedure and is in pain. The patient requested prayer that God would relieve the pain. The chaplain prayer for the patient. The chaplain will follow up as needed.

## 2020-09-22 NOTE — Progress Notes (Addendum)
      [address]       [place] [address]             [phone]      4 Days Post-Op Procedure(s) (LRB): CORONARY ARTERY BYPASS GRAFTING (CABG)X 3, ON PUMP, USING LEFT INTERAL MAMMARY ARTERY AND ENDOSCOPICALLY HARVESTED RIGHT GREATER SAPHENOUS VEIN. LIMA TO LAD, SVG TO OM, SVG TO PD (N/A) CLIPPING OF ATRIAL APPENDAGE USING 45 ATRICURE LAA  EXCLUSION SYSTEM (N/A) TRANSESOPHAGEAL ECHOCARDIOGRAM (TEE) (N/A) ENDOVEIN HARVEST OF GREATER SAPHENOUS VEIN (Right)   Subjective:  Patient having a lot of pain.  Has chronic pain at home and states his medications haven't all been restarted.  Wants to go home.  + ambulation   + BM  Objective: Vital signs in last 24 hours: Temp:  [97.5 F (36.4 C)-98.4 F (36.9 C)] 98.2 F (36.8 C) (10/25 0423) Pulse Rate:  [69-153] 99 (10/25 0423) Cardiac Rhythm: Atrial fibrillation (10/24 1904) Resp:  [16-27] 16 (10/25 0423) BP: (95-152)/(62-95) 102/67 (10/25 0423) SpO2:  [90 %-98 %] 95 % (10/25 0423) FiO2 (%):  [21 %] 21 % (10/24 1155) Weight:  [115.5 kg] 115.5 kg (10/25 0423)  Intake/Output from previous day: 10/24 0701 - 10/25 0700 In: 660 [P.O.:660] Out: 475 [Urine:475]  General appearance: alert, cooperative and no distress Heart: irregularly irregular rhythm Lungs: clear to auscultation bilaterally Abdomen: soft, non-tender; bowel sounds normal; no masses,  no organomegaly Extremities: edema +1 Wound: pravena on sternotomy, EVH site C/D/I  Lab Results: Recent Labs    09/21/20 0113 09/22/20 0415  WBC 20.5* 15.8*  HGB 11.9* 11.3*  HCT 37.1* 35.7*  PLT 143* 177   BMET:  Recent Labs    09/21/20 0113 09/22/20 0415  NA 135 134*  K 4.4 4.2  CL 99 98  CO2 24 26  GLUCOSE 129* 91  BUN 28* 41*  CREATININE 1.08 1.05  CALCIUM 9.9 9.4    PT/INR: No results for input(s): LABPROT, INR in the last 72 hours. ABG    Component Value Date/Time   PHART 7.429 09/19/2020 0959   HCO3 27.4 09/19/2020 0959   TCO2 29  09/19/2020 0959   O2SAT 74.3 09/20/2020 0350   CBG (last 3)  Recent Labs    09/21/20 1633 09/21/20 2125 09/22/20 0614  GLUCAP 85 118* 91    Assessment/Plan: S/P Procedure(s) (LRB): CORONARY ARTERY BYPASS GRAFTING (CABG)X 3, ON PUMP, USING LEFT INTERAL MAMMARY ARTERY AND ENDOSCOPICALLY HARVESTED RIGHT GREATER SAPHENOUS VEIN. LIMA TO LAD, SVG TO OM, SVG TO PD (N/A) CLIPPING OF ATRIAL APPENDAGE USING 45 ATRICURE LAA  EXCLUSION SYSTEM (N/A) TRANSESOPHAGEAL ECHOCARDIOGRAM (TEE) (N/A) ENDOVEIN HARVEST OF GREATER SAPHENOUS VEIN (Right)  1. CV- chronic Atrial Fibrillation, rate is controlled, BP controlled- on Coreg 25 mg BID, will d/c EPW today, restart home Eliquis this evening 2. Pulm- off oxygen, no acute issues 3. Renal- creatinine stable, weight remains above baseline, will continue Lasix, add TED hose 4. Chronic Pain- will adjust Tramadol dosing to home regimen, restart home Neurontin 5. CBGs controlled, patient isnt' a diabetic will d/c SSIP, levemir 6. Dispo- patient stable, has chronic atrial fibrillation rate is controlled, will d/c EPW today, restart home Eliquis this evening, continue diuresis, adjusted pain medications to home chronic pain treatment, d/c CBGS.Marland Kitchen if patient remains stable, will d/c home tomorrow   LOS: 10 days   Ellwood Handler, PA-C 09/22/2020   Remove Preveena in am  CXR in am Home on Eliquis, 81 ASA

## 2020-09-22 NOTE — Op Note (Signed)
NAME: Jeremy Reese, PARISIEN MEDICAL RECORD VQ:[id number] ACCOUNT [id number] DATE OF BIRTH:[birth year] FACILITY: [facility] LOCATION: [facility] PHYSICIAN:Trini Christiansen VAN TRIGT III, MD  OPERATIVE REPORT  DATE OF PROCEDURE:  09/18/2020  OPERATION: 1.  Coronary artery bypass grafting x3 (left internal mammary artery to left anterior descending, saphenous vein graft to obtuse marginal1, saphenous vein graft to posterior descending). 2.  Endoscopic harvest of right leg greater saphenous vein. 3.  Left atrial appendage was clip using a 45 mm AtriCure exclusion system.  PREOPERATIVE DIAGNOSES:   1.  Severe 3-vessel coronary artery disease.  2.  Permanent atrial fibrillation.  3.  Unstable angina.   4.  Obesity.  POSTOPERATIVE DIAGNOSES:   1.  Severe 3-vessel coronary artery disease.  2.  Permanent atrial fibrillation.  3.  Unstable angina.   4.  Obesity.  SURGEON:  Ivin Poot, MD  ASSISTANT:  Nicholes Rough PA-C.  CLINICAL NOTE:  The patient is a 69 year old morbidly obese male who was admitted to the hospital with symptoms of unstable angina and underwent coronary catheterization by Dr. Tamala Julian, which demonstrated severe 3-vessel coronary artery disease.  His LV  function was fairly well preserved.  He had permanent atrial fibrillation, on Eliquis.  He was recommended for multivessel CABG.  After reviewing his cardiac cath images and echocardiogram images, I examined the patient and discussed the procedure of  CABG with him.  I agreed that CABG would be the best long-term therapy for his severe coronary artery disease.  We discussed the major aspects of the surgery including the use of general anesthesia and cardiopulmonary bypass, the location of the surgical  incisions, and the expected postoperative hospital recovery.  We discussed the risks of the surgery including risks of stroke, bleeding, infection, organ failure, pulmonary problems including pleural effusion, and death.  After reviewing these  issues,  he demonstrated his understanding and agreed to proceed with surgery under what I felt was an informed consent.  OPERATIVE FINDINGS: 1.  Adequate conduit. 2.  Small but graftable target vessels. 3.  No blood cell transfusion required with preservation of LV function by echo at the termination of bypass.  DESCRIPTION OF PROCEDURE:  The patient was brought from the preoperative holding area to the operating room where he was induced for general anesthesia and remained stable.  In the preoperative holding area, the patient was consented and documented and  final issues were addressed.  The patient's chest, abdomen and legs were prepped and draped as a sterile field.  A proper time-out was performed.  A sternal incision was made as the saphenous vein was harvested endoscopically from the right leg.  The left internal mammary artery was  harvested as a pedicle graft from its origin at the subclavian vessels.  This was difficult due to the patient's obese body habitus, but it was a good vessel.  The sternal retractor was placed using the deep blades.  The pericardium was opened and suspended.  Pursestrings were placed in the ascending aorta and right atrium.  The patient was heparinized, then cannulated and placed on bypass.  The coronaries were  identified for grafting and the mammary artery and vein grafts were prepared for the distal anastomoses.  Cardioplegic cannulas were placed for both antegrade and retrograde cold blood cardioplegia, and the patient was cooled to 32 degrees.  The aortic  crossclamp was applied and 1 L of cold blood cardioplegia was delivered in split doses between the antegrade aortic and retrograde coronary sinus catheters.  There was good cardioplegic  arrest and supple temperature dropped less than 14 degrees.   Cardioplegia was delivered every 20 minutes.  The distal coronary anastomoses were performed.  The first distal anastomosis was to the posterior descending  branch of the distal circumflex.  It was a 1.5 mm vessel.  A reverse saphenous vein was sewn end-to-side with running 7-0 Prolene with good flow  through the graft.  There was a high-grade stenosis in this vessel.  The second distal anastomosis was of the OM1 branch of left circumflex.  This was a 1.5 mm vessel, proximal 80% stenosis.  Reverse saphenous vein was sewn end-to-side with running 7-0 Prolene with good flow through the graft.  Cardioplegia was redosed.  The Atricure Clip was then carefully placed at the base of the left atrial appendage  The third distal anastomosis was the distal LAD.  This had a proximal 95% stenosis.  The left IMA pedicle was brought through an opening in the left lateral pericardium and was brought down onto the LAD and sewn end-to-side with running 8-0 Prolene.   There was good flow through the anastomosis after briefly releasing the pedicle bulldog on the mammary artery.  The bulldog was reapplied and the pedicle was secured to the epicardium with 6-0 Prolenes.  Cardioplegia was redosed.  While the crossclamp was still in place, 2 proximal vein anastomoses were performed on the ascending aorta using a 4.5 mm punch and running 6-0 Prolene.  Prior to tying down the final proximal anastomosis, air was and vented from the coronaries with a  dose of retrograde warm blood cardioplegia.  The crossclamp was removed.  The heart resumed a spontaneous rhythm.  The vein grafts were de-aired and opened.  There was good flow through the graft and hemostasis was documented at the proximal and distal sites.  The patient was rewarmed and reperfused and temporary pacing wires  were applied.  The lungs were expanded and the ventilator was resumed.  The patient was weaned from cardiopulmonary bypass without difficulty with good hemodynamics and normal cardiac output.  Protamine was administered without adverse reaction.  The  cannulas were removed.  There was adequate hemostasis.  The  superior pericardial fat was closed over the aorta.  Anterior mediastinum and bilateral pleural tubes were placed and brought out through separate incisions.  The sternum was closed with wire.   The patient remained stable.  The pectoralis fascia was closed with a running Vicryl.  Subcutaneous and skin layers were closed with Vicryl and sterile dressings were applied.  The patient returned to the ICU in stable condition.  VN/NUANCE  D:09/22/2020 T:09/22/2020 JOB:[id number]

## 2020-09-22 NOTE — Progress Notes (Signed)
CARDIAC REHAB PHASE I   PRE:  Rate/Rhythm: 106 afib    BP: sitting 81/56, recheck 88/46    SaO2:   MODE:  Ambulation: 130 ft   POST:  Rate/Rhythm: 138 afib    BP: sitting 123/85     SaO2: 94 RA  Upon arrival pt in recliner, sts he feels very claustrophobic and wants to go home. BP low, RN reports holding metroprolol. Pt stood pushing off his arms, legs spread so he could urinate. Discussed sternal precautions. Used rollator to hold VAC and in case pt needs to sit. Pt bent over, sts this is his norm due to his back issues. Slow walk, assist x1 with gait belt. Pt fatigued with distance, rest x3. HR elevated. To bed, pt needed max assist to get legs in bed. Sts he can do this independently at home. His wife is working today, will be with him starting tomorrow. He is begging to d/c. Discussed possible antianxiety med to help with his claustrophia. He is very anxious in general. [phone]   [place], ACSM 09/22/2020 10:50 AM

## 2020-09-22 NOTE — Discharge Summary (Addendum)
[address]       [place] [address]             [phone]    Physician Discharge Summary  Patient ID: Jeremy Reese MRN: [id number] DOB/AGE: 02-Sep-1951 69 y.o.  Admit date: 09/12/2020 Discharge date: 09/23/2020  Admission Diagnoses:  Patient Active Problem List   Diagnosis Date Noted   Chest pain    Unstable angina ([place]) 09/12/2020   PAD (peripheral artery disease) ([place]) 09/12/2020   CAD, multiple vessel 09/12/2020   3-vessel CAD 07/25/2017   Chronic diastolic congestive heart failure ([place]) 04/29/2015   Hyperlipidemia 04/01/2015   Lumbago 04/01/2015   Carotid stenosis 04/01/2015   Chronic atrial fibrillation ([place]) 04/01/2015   Discharge Diagnoses:  Patient Active Problem List   Diagnosis Date Noted   S/P CABG x 3 09/18/2020   Chest pain    Unstable angina ([place]) 09/12/2020   PAD (peripheral artery disease) ([place]) 09/12/2020   CAD, multiple vessel 09/12/2020   3-vessel CAD 07/25/2017   Chronic diastolic congestive heart failure ([place]) 04/29/2015   Hyperlipidemia 04/01/2015   Lumbago 04/01/2015   Carotid stenosis 04/01/2015   Chronic atrial fibrillation ([place]) 04/01/2015   Discharged Condition: good  History of Present Illness:  Jeremy Reese is a 69 yo male with known history of Hyperlipidemia, Carotid Stenosis, Chronic Atrial Fibrillation, and CAD.  His CAD dates back over 10 years ago when he underwent PCI to Left Cirumflex vessel.  The patient had been in his usual state of health until recently.  He presented to [facility] heart care in [place] with complaints of chest /tightness for approximately 1 month. He stated the episodes of chest pain,  felt similar to previous anginal equivalent. The pain was exertional and nonexertional; however, worse with activity. No associated dyspnea or diaphoresis. He had self increased Imdur to 2 in the morning and 2 at lunchtime, as well as 2 at bedtime for 3 weeks during the weeks leading up to his appointment.  His pharmacist was reportedly concerned that he was taking too much Imdur, so he was taking 1 tablet 3 times per day for 1 week. He had not used his sublingual nitroglycerin. He reported more pain since his Imdur dose has been reduced.  It was felt the patient should undergo cardiac catheterization.  This was performed by Dr. Rockey Situ on 09/12/2020 and showed multivessel CAD.  It was felt coronary bypass grafting would be indicated and he was transferred to [facility] for evaluation.  Hospital Course:   The patient remained chest pain free during admission.   He was evaluated by Dr. Kipp Brood who felt the patient would benefit from coronary bypass procedure.  However due to scheduling the surgery would be performed by Dr. Prescott Gum.  The risks and benefits of the procedure were explained to the patient and he was agreeable to proceed.  Jeremy Reese was taken to the operating room on 09/18/2020.  He underwent CABG x 3 utilizing LIMA to LAD, SVG to OM1, and SVG to PDA.  He had clipping of his LA appendage with a 45 mm Atricure Clip. He also underwent endoscopic harvest of greater saphenous vein from his right leg.  He tolerated the procedure without difficulty and was taken to the SICU in stable condition.  During his stay in the SICU the patient was weaned and extubated on POD #1.  The patients chest tubes and arterial lines were removed without difficulty.  He has chronic atrial  fibrillation and will be restarted on home Eliquis prior to discharge.  He was weaned off Milrinone on 09/20/2020.  He was started on diuretics for volume overload state.  He was hypertensive and his Coreg dose was titrated to 25 mg BID.  He remained in rate controlled Atrial Fibrillation.  He was medically stable for transfer to the progressive care unit.  Patient had issues with pain control.  He uses pain medication chronically prior to admission and his home regimen was restarted.  His pacing wires were removed without  difficulty.  He is ambulating independently.  His incisions are healing without evidence of infection.  He is medically stable for discharge home today.    Significant Diagnostic Studies: angiography:   Ost Cx to Prox Cx lesion is 95% stenosed. Ost LAD to Prox LAD lesion is 70% stenosed. Mid LAD lesion is 60% stenosed.  Treatments: surgery:   CORONARY ARTERY BYPASS GRAFTING (CABG)X 3, ON PUMP, USING LEFT INTERAL MAMMARY ARTERY AND ENDOSCOPICALLY HARVESTED RIGHT GREATER SAPHENOUS VEIN. LIMA TO LAD, SVG TO OM, SVG TO PD (N/A) CLIPPING OF ATRIAL APPENDAGE USING 15 ATRICURE LAA  EXCLUSION SYSTEM (N/A) TRANSESOPHAGEAL ECHOCARDIOGRAM (TEE) (N/A) ENDOVEIN HARVEST OF GREATER SAPHENOUS VEIN (Right)    LIMA to LAD SVG to OM1 SVG to PDA  Discharge Exam: Blood pressure 120/87, pulse 89, temperature (!) 97.5 F (36.4 C), temperature source Oral, resp. rate 15, height 5\' 11"  (1.803 m), weight 115.4 kg, SpO2 92 %.  General appearance: alert, cooperative and no distress Heart: irregularly irregular rhythm Lungs: clear to auscultation bilaterally Abdomen: soft, non-tender; bowel sounds normal; no masses,  no organomegaly Extremities: edema trace Wound: clean and dry   Discharge Medications:  The patient has been discharged on:   1.Beta Blocker:  Yes [ X  ]                              No   [   ]                              If No, reason:  2.Ace Inhibitor/ARB: Yes [   ]                                     No  [  X  ]                                     If No, reason: labile BP  3.Statin:   Yes [ X  ]                  No  [   ]                  If No, reason:  4.Ecasa:  Yes  [ X  ]                  No   [   ]                  If No, reason:    Allergies as of 09/23/2020       Reactions   Propafenone Anxiety   Codeine Nausea Only   Penicillins Rash  Medication List     STOP taking these medications    isosorbide mononitrate 60 MG 24 hr tablet Commonly known as:  IMDUR       TAKE these medications    aspirin EC 81 MG tablet Take 1 tablet (81 mg total) by mouth daily. What changed: when to take this   calcium gluconate 500 MG tablet Take 500 mg by mouth at bedtime.   carvedilol 25 MG tablet Commonly known as: COREG Take 1 tablet (25 mg total) by mouth 2 (two) times daily with a meal. What changed:  medication strength how much to take when to take this   Eliquis 5 MG Tabs tablet Generic drug: apixaban TAKE ONE TABLET TWICE DAILY What changed: how much to take   ezetimibe 10 MG tablet Commonly known as: ZETIA TAKE ONE (1) TABLET EACH DAY What changed: See the new instructions.   furosemide 40 MG tablet Commonly known as: LASIX Take 1 tablet (40 mg total) by mouth daily. What changed:  medication strength how much to take how to take this when to take this additional instructions   gabapentin 400 MG capsule Commonly known as: NEURONTIN Take 400 mg by mouth 3 (three) times daily.   melatonin 5 MG Tabs Take 5 mg by mouth at bedtime.   nitroGLYCERIN 0.4 MG SL tablet Commonly known as: NITROSTAT Place 1 tablet (0.4 mg total) under the tongue every 5 (five) minutes as needed for chest pain.   potassium chloride 10 MEQ tablet Commonly known as: KLOR-CON Take 1 tablet (10 mEq total) by mouth daily as needed. What changed: when to take this   rosuvastatin 40 MG tablet Commonly known as: CRESTOR Take 1 tablet (40 mg total) by mouth daily. What changed: when to take this   traMADol 50 MG tablet Commonly known as: ULTRAM Take 100 mg by mouth every 6 (six) hours as needed for moderate pain.        Follow-up Information     Ivin Poot, MD Follow up on 10/29/2020.   Specialty: Cardiothoracic Surgery Why: Appointment is at 11:30. please get CXR at 11:00 at [facility] located on first floor of our office building Contact information: [place] [address] [phone]          Loel Dubonnet, NP Follow up on 10/10/2020.   Specialty: Cardiology Why: Appointment is at 9:00  Contact information: [place] [address] [phone]                 Signed: Ellwood Handler, PA-C \\10 /26/2021, 7:53 AM   patient examined and medical record reviewed,agree with above note. Tharon Aquas Trigt III 09/23/2020

## 2020-09-22 NOTE — Progress Notes (Signed)
Pt's pain level is always a 9 and he stated that he takes 100 mg Tramadol three times a day to control his pain. Please advise.Patient c/o of wanted to go home and finally ask for a Chaplain. The Chaplain came and had a session with the patient who reported to me that "he enjoyed talking to the Chaplain".

## 2020-09-23 ENCOUNTER — Inpatient Hospital Stay (HOSPITAL_COMMUNITY): Payer: Medicare HMO

## 2020-09-23 LAB — BASIC METABOLIC PANEL
Anion gap: 6 (ref 5–15)
BUN: 44 mg/dL — ABNORMAL HIGH (ref 8–23)
CO2: 31 mmol/L (ref 22–32)
Calcium: 9.5 mg/dL (ref 8.9–10.3)
Chloride: 97 mmol/L — ABNORMAL LOW (ref 98–111)
Creatinine, Ser: 1.16 mg/dL (ref 0.61–1.24)
GFR, Estimated: >60 mL/min (ref >60)
Glucose, Bld: 107 mg/dL — ABNORMAL HIGH (ref 70–99)
Potassium: 4.6 mmol/L (ref 3.5–5.1)
Sodium: 134 mmol/L — ABNORMAL LOW (ref 135–145)

## 2020-09-23 LAB — GLUCOSE, CAPILLARY
Glucose-Capillary: 98 mg/dL (ref 70–99)
Glucose-Capillary: 98 mg/dL (ref 70–99)
Glucose-Capillary: 101 mg/dL — ABNORMAL HIGH (ref 70–99)

## 2020-09-23 LAB — CBC
HCT: 35.6 % — ABNORMAL LOW (ref 39.0–52.0)
Hemoglobin: 11.3 g/dL — ABNORMAL LOW (ref 13.0–17.0)
MCH: 31.7 pg (ref 26.0–34.0)
MCHC: 31.7 g/dL (ref 30.0–36.0)
MCV: 100 fL (ref 80.0–100.0)
Platelets: 211 10*3/uL (ref 150–400)
RBC: 3.56 MIL/uL — ABNORMAL LOW (ref 4.22–5.81)
RDW: 12.8 % (ref 11.5–15.5)
WBC: 11.9 10*3/uL — ABNORMAL HIGH (ref 4.0–10.5)
nRBC: 0 % (ref 0.0–0.2)

## 2020-09-23 MED ORDER — CARVEDILOL 25 MG PO TABS: 25.0000 mg | ORAL_TABLET | Freq: Two times a day (BID) | ORAL | 3 refills | Status: DC

## 2020-09-23 MED ORDER — FUROSEMIDE 40 MG PO TABS
40.0000 mg | ORAL_TABLET | Freq: Every day | ORAL | 0 refills | Status: DC
Start: 2020-09-23 — End: 2020-10-10

## 2020-09-23 NOTE — Progress Notes (Signed)
Pts wife not coming till tonight. Called her and discussed IS, sternal precautions, walking, and CRPII. Wife receptive.  [place], ACSM 2:44 PM 09/23/2020  Discussed education with pt. Discussed IS, sternal precautions, exercise/mobility at home, diet, and CRPII. Pt receptive. Will refer to [place]. Reminded pt to communicate with MD any med concerns. He would benefit from rollator for home to help him increase his distance at home. Also would benefit from HHPT. Notified CM.  [place], ACSM 2:46 PM 09/23/2020

## 2020-09-23 NOTE — TOC Transition Note (Signed)
Transition of Care [facility]) - CM/SW Discharge Note   Patient Details  Name: Jeremy Reese MRN: [id number] Date of Birth: [birth year]  [facility]) CM/SW Contact:  Carles Collet, RN Phone Number: 09/23/2020, 2:50 PM   Clinical Narrative:   Damaris Schooner w patient over the phone. He is agreeable to rollator, and would like it shipped to house. Referral made to [employer]. Agreeable to [facility] services and placing [facility] to [facility]. Referral accepted. No other CM needs at this time    Final next level of care: [place] Barriers to Discharge: No Barriers Identified   Patient Goals and CMS Choice Patient states their goals for this hospitalization and ongoing recovery are:: to go home CMS Medicare.gov Compare Post Acute Care list provided to:: Patient Choice offered to / list presented to : Patient  Discharge Placement                       Discharge Plan and Services                DME Arranged: Walker rolling with seat DME Agency: [employer] Date DME Agency Contacted: 09/23/20 Time DME Agency Contacted: [phone] Representative spoke with at DME Agency: [place]: PT [place]: Well [place] Date [place]: 09/23/20 Time [place]: 1449 Representative spoke with at [place]: [place] ([place]) Interventions     Readmission Risk Interventions No flowsheet data found.

## 2020-09-23 NOTE — Progress Notes (Signed)
Mobility Specialist - Progress Note   09/23/20 1524  Mobility  Activity Ambulated to bathroom;Ambulated in hall  Level of Assistance Moderate assist, patient does 50-74%  Assistive Device Four wheel walker  Distance Ambulated (ft) 100 ft  Mobility Response Tolerated fair  Mobility performed by Mobility specialist  $Mobility charge 1 Mobility    Pre-mobility: 84 HR During mobility: 91 HR Post-mobility: 83 HR  Pt required mod assist in order to stand from chair and toilet. Prior to going out in the hallway, he passed a large BM in the bathroom. He required several standing rest breaks while ambulating due to lower back pain, which he rated an 8/10. Pt back in chair after walk. RN aware of his pain.   Pricilla Handler Mobility Specialist Mobility Specialist Phone: [phone]

## 2020-09-23 NOTE — Progress Notes (Addendum)
      [address]       [place] [address]             [phone]      5 Days Post-Op Procedure(s) (LRB): CORONARY ARTERY BYPASS GRAFTING (CABG)X 3, ON PUMP, USING LEFT INTERAL MAMMARY ARTERY AND ENDOSCOPICALLY HARVESTED RIGHT GREATER SAPHENOUS VEIN. LIMA TO LAD, SVG TO OM, SVG TO PD (N/A) CLIPPING OF ATRIAL APPENDAGE USING 45 ATRICURE LAA  EXCLUSION SYSTEM (N/A) TRANSESOPHAGEAL ECHOCARDIOGRAM (TEE) (N/A) ENDOVEIN HARVEST OF GREATER SAPHENOUS VEIN (Right)   Subjective:  Up in chair, no new complaints.  Feeling okay, remains cold at times.  Happy to go home today.  Objective: Vital signs in last 24 hours: Temp:  [97.5 F (36.4 C)-98.1 F (36.7 C)] 97.5 F (36.4 C) (10/26 0510) Pulse Rate:  [81-99] 89 (10/25 2036) Cardiac Rhythm: Atrial fibrillation (10/25 2011) Resp:  [15-20] 15 (10/25 2036) BP: (100-139)/(62-92) 120/87 (10/26 0017) SpO2:  [92 %-98 %] 92 % (10/26 0017) Weight:  [115.4 kg] 115.4 kg (10/26 0510)  Intake/Output from previous day: 10/25 0701 - 10/26 0700 In: 6 [I.V.:6] Out: 850 [Urine:850]  General appearance: alert, cooperative and no distress Heart: irregularly irregular rhythm Lungs: clear to auscultation bilaterally Abdomen: soft, non-tender; bowel sounds normal; no masses,  no organomegaly Extremities: edema trace Wound: clean and dry  Lab Results: Recent Labs    09/22/20 0415 09/23/20 0053  WBC 15.8* 11.9*  HGB 11.3* 11.3*  HCT 35.7* 35.6*  PLT 177 211   BMET:  Recent Labs    09/22/20 0415 09/23/20 0053  NA 134* 134*  K 4.2 4.6  CL 98 97*  CO2 26 31  GLUCOSE 91 107*  BUN 41* 44*  CREATININE 1.05 1.16  CALCIUM 9.4 9.5    PT/INR: No results for input(s): LABPROT, INR in the last 72 hours. ABG    Component Value Date/Time   PHART 7.429 09/19/2020 0959   HCO3 27.4 09/19/2020 0959   TCO2 29 09/19/2020 0959   O2SAT 74.3 09/20/2020 0350   CBG (last 3)  Recent Labs    09/22/20 1756 09/22/20 2102  09/23/20 0637  GLUCAP 166* 108* 98    Assessment/Plan: S/P Procedure(s) (LRB): CORONARY ARTERY BYPASS GRAFTING (CABG)X 3, ON PUMP, USING LEFT INTERAL MAMMARY ARTERY AND ENDOSCOPICALLY HARVESTED RIGHT GREATER SAPHENOUS VEIN. LIMA TO LAD, SVG TO OM, SVG TO PD (N/A) CLIPPING OF ATRIAL APPENDAGE USING 45 ATRICURE LAA  EXCLUSION SYSTEM (N/A) TRANSESOPHAGEAL ECHOCARDIOGRAM (TEE) (N/A) ENDOVEIN HARVEST OF GREATER SAPHENOUS VEIN (Right)  1. CV- chronic Atrial Fibrillation, rate controlled on Coreg 25 mg BID, Eliquis 2. Pulm- CXR w/o pleural effusion, pneumothorax, off oxygen continue IS at discharge 3. Renal- creatinine stable, weight is trending down, continue TED hose, taper Lasix 4. Chronic Pain- improved with resumption of home Neurontin and Tramadol regimen... he has this medication at home and will not require a prescription 5. Dispo- patient stable, will d/c home today   LOS: 11 days    [place] Handler, PA-C 09/23/2020  DC instructions reviewed with patient Ready for DC home patient examined and medical record reviewed,agree with above note. Tharon Aquas Trigt III 09/23/2020

## 2020-09-24 ENCOUNTER — Telehealth: Payer: Self-pay | Admitting: Cardiovascular Disease

## 2020-09-24 NOTE — Telephone Encounter (Signed)
Dewana from [facility] states they have not been able to get in contact with the patient. She states they are going to try sending a physical therapist to drive by his house to contact him.

## 2020-09-24 NOTE — Telephone Encounter (Signed)
To Dr. Bluford Kaufmann to review.  Looks like PT was ordered post discharge from [facility].  Should this message to to the surgeon's office?

## 2020-09-25 NOTE — Telephone Encounter (Signed)
Left voicemail message that I was returning call regarding patient and PT orders written at discharge and would need to contact provider who ordered those services and to call back if any further questions.

## 2020-10-01 ENCOUNTER — Telehealth: Payer: Self-pay

## 2020-10-01 NOTE — Telephone Encounter (Addendum)
Incoming call received from Jewel Baize, PT with [facility]. Horris Latino rqst to speak with Laurann [place], NP. Adv her that Urban Gibson is currently seeing patients.  Horris Latino sts that she is out to the patients home and the patient is complaining of feeling poorly. Horris Latino is currently with the patient. Patient complains of loss of appetite and upset stomach. He denies n/v, diarrhea,  no signs of bleeding, no chest pain, some sob with ambulation. Patient is complaining of coldness in his hands and feet.he does have a hx neuropathy and takes gabapentin daily. Recommended that he also talk with the prescribing provider.   Patient is s/p cardiac bypass and was d/c from the hospital on 09/24/20. Horris Latino did check the patient VS on arrival 132/70 68 bpm 97 O2. Recheck 15 min later 122/60 67 bpm.  Christ Kick that I will update Urban Gibson and call back with her recommendation.   Talked with Urban Gibson. Per CW the patients vital signs are fine, he needs to eat and drink, he should f/u with our office sooner than his scheduled appt if he feels poorly.   Spoke with the patient and advised him of CW recommendation. Adv him to eat small meals through the day and to stay well hydrated. Adv him not to over do it and limit his activity to small burst. Patient appt moved up to 10/06/20 @ 3:20pm with Dr. Rockey Situ.  Advised the patient to contact the office sooner if symptoms worsen.  Patient verbalized understanding and voiced appreciation for the call.

## 2020-10-05 NOTE — Progress Notes (Signed)
Cardiology Office Note  Date:  10/06/2020   ID:  Jeremy Reese, DOB 1951-09-24, MRN [id number]  PCP:  Cipriano Mile, NP   Chief Complaint  Patient presents with  . Follow-up    s/p CABGx3  Pt states he cannot sleep good, brought a list---has not had booster shot, would like xanax for anxiety/sleep, cbd oil?, having cramps and spasms in legs, little energy, cold hands/feet, wants to get outside more---wants to know if he can get another book for before surgery and after surgery, was given to him in the hospital but he cannot find it now.    HPI:  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 Lucky Cowboy,  atrial fibrillation dating back to 2011  severe back pain not a surgical candidate per the surgeons given his severe bilateral carotid disease Old CVA on CT scan head in 2015 who presents for follow-up of his chronic atrial fibrillation,, PAD, CAD with recent CABG  Recent cardiac catheterization October 2021 Difficult to engage left main, Images very concerning for critical ostial/proximal left circumflex disease estimated 95% or greater Nondominant right LAD with moderate to severe ostial/proximal disease though difficult to visualize, and 60% mid disease  Cath 09/12/2020  Ost Cx to Prox Cx lesion is 95% stenosed.  Ost LAD to Prox LAD lesion is 70% stenosed.  Mid LAD lesion is 60% stenosed.  worsening anginal symptoms over the past several weeks with clear escalation of his isosorbide to relieve his pressure Critical ostial left circumflex disease of a large vessel (stent stenosis, heavily calcified), nondominant right, also with disease of the LAD Evaluated for CABG.   Echo: EF 60%, calcified aortic valve  operating room details 09/18/2020.  He underwent CABG x 3 utilizing LIMA to LAD, SVG to OM1, and SVG to PDA.  He had clipping of his LA  appendage with a 45 mm Atricure Clip.  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 PCI of the left circumflex vessel  In the past he has tried higher dose pravastatin 80 mg and had side effects including fatigue, weakness, muscle ache. He is able to tolerate 40 mg daily.  In terms of his other medical issues, he has a "flatback" and per his report has spinal stenosis, scoliosis and chronic pain. He is scheduled to see [facility] back clinic. He reports tramadol helps to some degree but still has significant breakthrough pain. He is very limited in his ability to exert himself or ambulate secondary to his back.  Carotid ultrasound reviewed with him from 01/14/2015 showing bilateral internal carotid  occlusion, no significant change from ultrasound dated June 2015  Prior studies show cardioversion 02/25/2015 Transesophageal echo 08/11/2010 with PFO noted, ejection fraction 50%, left atrium moderately dilated, moderate MR, mild to moderate TR   PMH:   has a past medical history of Atrial flutter, paroxysmal ([place]), Carotid artery disease without cerebral infarction [facility]), Coronary artery disease (2011), Flatback  syndrome of thoracolumbar region, H/O calcium pyrophosphate deposition disease (CPPD), History of TIA (transient ischemic attack), Hyperlipidemia, Paroxysmal A-fib ([place]), and Spinal stenosis of lumbar region without neurogenic claudication.  PSH:    Past Surgical History:  Procedure Laterality Date  . BILATERAL CARPAL TUNNEL RELEASE    . CARDIAC CATHETERIZATION    . CIRCUMCISION, NON-NEWBORN    . CLIPPING OF ATRIAL APPENDAGE N/A 09/18/2020   Procedure: CLIPPING OF ATRIAL APPENDAGE USING 87 ATRICURE LAA  EXCLUSION SYSTEM;  Surgeon: Ivin Poot, MD;  Location: [place];  Service: Open Heart Surgery;  Laterality: N/A;  . CORONARY ANGIOPLASTY WITH STENT PLACEMENT  2011  . CORONARY ARTERY BYPASS GRAFT N/A 09/18/2020   Procedure: CORONARY ARTERY BYPASS GRAFTING (CABG)X 3, ON PUMP, USING LEFT INTERAL MAMMARY ARTERY AND ENDOSCOPICALLY HARVESTED RIGHT GREATER SAPHENOUS VEIN. LIMA TO LAD, SVG TO OM, SVG TO PD;  Surgeon: Ivin Poot, MD;  Location: [place];  Service: Open Heart Surgery;  Laterality: N/A;  . ENDOVEIN HARVEST OF GREATER SAPHENOUS VEIN Right 09/18/2020   Procedure: ENDOVEIN HARVEST OF GREATER SAPHENOUS VEIN;  Surgeon: Ivin Poot, MD;  Location: [place];  Service: Open Heart Surgery;  Laterality: Right;  . LEFT HEART CATH AND CORONARY ANGIOGRAPHY Left 09/12/2020   Procedure: LEFT HEART CATH AND CORONARY ANGIOGRAPHY;  Surgeon: Minna Merritts, MD;  Location: [place] CV [facility];  Service: Cardiovascular;  Laterality: Left;  . TEE WITHOUT CARDIOVERSION N/A 09/18/2020   Procedure: TRANSESOPHAGEAL ECHOCARDIOGRAM (TEE);  Surgeon: Prescott Gum, Collier Salina, MD;  Location: [place];  Service: Open Heart Surgery;  Laterality: N/A;    Current Outpatient Medications  Medication Sig Dispense Refill  . aspirin EC 81 MG tablet Take 1 tablet (81 mg total) by mouth daily. (Patient taking differently: Take 81 mg by mouth 3 (three) times a week. ) 90 tablet 3  . calcium gluconate 500 MG tablet Take 500 mg  by mouth at bedtime.     . carvedilol (COREG) 25 MG tablet Take 1 tablet (25 mg total) by mouth 2 (two) times daily with a meal. 60 tablet 3  . ELIQUIS 5 MG TABS tablet TAKE ONE TABLET TWICE DAILY (Patient taking differently: Take 5 mg by mouth 2 (two) times daily. ) 60 tablet 1  . ezetimibe (ZETIA) 10 MG tablet TAKE ONE (1) TABLET EACH DAY (Patient taking differently: Take 10 mg by mouth daily. ) 30 tablet 3  . furosemide (LASIX) 40 MG tablet Take 1 tablet (40 mg total) by mouth daily. 7 tablet 0  . gabapentin (NEURONTIN) 400 MG capsule Take 400 mg by mouth 3 (three) times daily.     . Melatonin 5 MG TABS Take 5 mg by mouth at bedtime.     . nitroGLYCERIN (NITROSTAT) 0.4 MG SL tablet Place 1 tablet (0.4 mg total) under the tongue every 5 (five) minutes as needed for chest pain. 25 tablet 3  . potassium chloride (K-DUR) 10 MEQ tablet Take 1 tablet (10 mEq total) by mouth daily as needed. (Patient taking differently: Take 10 mEq by mouth at bedtime. ) 90 tablet 6  . rosuvastatin (CRESTOR) 40 MG tablet Take  1 tablet (40 mg total) by mouth daily. (Patient taking differently: Take 40 mg by mouth every evening. ) 90 tablet 3  . traMADol (ULTRAM) 50 MG tablet Take 100 mg by mouth every 6 (six) hours as needed for moderate pain.      No current facility-administered medications for this visit.     Allergies:   Propafenone, Codeine, and Penicillins   Social History:  The patient  reports that he has quit smoking. His smoking use included cigarettes. He has a 10.00 pack-year smoking history. He has never used smokeless tobacco. He reports current alcohol use. He reports that he does not use drugs.   Family History:   family history includes Heart disease (age of onset: 35) in his mother; Hypertension in his father.    Review of Systems: Review of Systems  Constitutional: Negative.   HENT: Negative.   Respiratory: Negative.   Cardiovascular: Negative.   Gastrointestinal: Negative.    Musculoskeletal: Positive for back pain and joint pain.  Neurological: Negative.   All other systems reviewed and are negative.   PHYSICAL EXAM: VS:  BP 106/68   Pulse 97   Ht 5\' 11"  (1.803 m)   Wt 245 lb (111.1 kg)   BMI 34.17 kg/m  , BMI Body mass index is 34.17 kg/m. Constitutional:  oriented to person, place, and time. No distress.  HENT:  Head: Grossly normal Eyes:  no discharge. No scleral icterus.  Neck: No JVD, no carotid bruits  Cardiovascular: Regular rate and rhythm, no murmurs appreciated Pulmonary/Chest: Clear to auscultation bilaterally, no wheezes or rails Abdominal: Soft.  no distension.  no tenderness.  Musculoskeletal: Normal range of motion Neurological:  normal muscle tone. Coordination normal. No atrophy Skin: Skin warm and dry Psychiatric: normal affect, pleasant   Recent Labs: 09/16/2020: ALT 26; TSH 2.608 09/19/2020: Magnesium 1.8 09/23/2020: BUN 44; Creatinine, Ser 1.16; Hemoglobin 11.3; Platelets 211; Potassium 4.6; Sodium 134    Lipid Panel Lab Results  Component Value Date   CHOL 108 09/05/2020   HDL 44 09/05/2020   LDLCALC 48 09/05/2020   TRIG 76 09/05/2020      Wt Readings from Last 3 Encounters:  10/06/20 245 lb (111.1 kg)  09/23/20 254 lb 8 oz (115.4 kg)  09/12/20 256 lb (116.1 kg)     ASSESSMENT AND PLAN:  Acute diastolic CHF (congestive heart failure) (HCC) - Lower extremity edema noted on exam today, recommend he stay on Lasix 40 daily and moderate his fluid intake Leg elevation when sitting Compression hose  Bi lower extremitylateral carotid artery stenosis - Plan: EKG 12-Lead Occluded carotids bilaterally -Stressed importance of aggressive lipid control  Coronary artery disease involving native coronary artery of native heart without angina pectoris - Recent CABG, Discussed recovery, participating in cardiac rehab, dietary changes, walking program  Chronic atrial fibrillation ([place]) - Plan: EKG 12-Lead On  anticoagulation, rate well controlled Tolerating Eliquis.   Rate controlled  Mixed hyperlipidemia - Plan: EKG 12-Lead  Crestor 40 mg daily with Zetia Cholesterol at goal  Morbid obesity Long discussion, need to change diet, continue recent weight loss, work with PT then transition to cardiac rehab    Total encounter time more than 25 minutes  Greater than 50% was spent in counseling and coordination of care with the patient    Orders Placed This Encounter  Procedures  . EKG 12-Lead     Signed, Esmond Plants, M.D., Ph.D. 10/06/2020  [place], [place]

## 2020-10-06 ENCOUNTER — Encounter: Payer: Self-pay | Admitting: Cardiovascular Disease

## 2020-10-06 ENCOUNTER — Ambulatory Visit: Payer: Medicare HMO | Admitting: Cardiovascular Disease

## 2020-10-06 ENCOUNTER — Other Ambulatory Visit: Payer: Self-pay

## 2020-10-06 VITALS — BP 106/68 | HR 97 | Ht 71.0 in | Wt 245.0 lb

## 2020-10-06 DIAGNOSIS — I6523 Occlusion and stenosis of bilateral carotid arteries: Secondary | ICD-10-CM

## 2020-10-06 DIAGNOSIS — I5032 Chronic diastolic (congestive) heart failure: Secondary | ICD-10-CM | POA: Diagnosis not present

## 2020-10-06 DIAGNOSIS — I25110 Atherosclerotic heart disease of native coronary artery with unstable angina pectoris: Secondary | ICD-10-CM

## 2020-10-06 DIAGNOSIS — I739 Peripheral vascular disease, unspecified: Secondary | ICD-10-CM

## 2020-10-06 DIAGNOSIS — I482 Chronic atrial fibrillation, unspecified: Secondary | ICD-10-CM | POA: Diagnosis not present

## 2020-10-06 DIAGNOSIS — E785 Hyperlipidemia, unspecified: Secondary | ICD-10-CM

## 2020-10-06 NOTE — Patient Instructions (Addendum)
BMP in 2-3 weeks  Medication Instructions:  No changes  If you need a refill on your cardiac medications before your next appointment, please call your pharmacy.    Lab work: No new labs needed   If you have labs (blood work) drawn today and your tests are completely normal, you will receive your results only by: Marland Kitchen MyChart Message (if you have MyChart) OR . A paper copy in the mail If you have any lab test that is abnormal or we need to change your treatment, we will call you to review the results.   Testing/Procedures: No new testing needed   Follow-Up: At [facility], you and your health needs are our priority.  As part of our continuing mission to provide you with exceptional heart care, we have created designated Provider Care Teams.  These Care Teams include your primary Cardiologist (physician) and Advanced Practice Providers (APPs -  Physician Assistants and Nurse Practitioners) who all work together to provide you with the care you need, when you need it.  . You will need a follow up appointment in 2 months  . Providers on your designated Care Team:   . Murray Hodgkins, NP . Christell Faith, PA-C . Marrianne Mood, PA-C  Any Other Special Instructions Will Be Listed Below (If Applicable).  COVID-19 Vaccine Information can be found at: ShippingScam.co.uk For questions related to vaccine distribution or appointments, please email [email or web address] or call [phone].

## 2020-10-09 ENCOUNTER — Other Ambulatory Visit: Payer: Self-pay | Admitting: Physician Assistant

## 2020-10-09 ENCOUNTER — Telehealth: Payer: Self-pay | Admitting: Cardiovascular Disease

## 2020-10-09 ENCOUNTER — Other Ambulatory Visit: Payer: Self-pay | Admitting: Family

## 2020-10-09 NOTE — Telephone Encounter (Signed)
Patient took a fleet enema for constipation and the box said do not use if you are on a low sodium diet.   Please call to discuss.

## 2020-10-09 NOTE — Telephone Encounter (Signed)
Spoke to patient. Explained that as long as he stays well hydrated if he is loosing a lot of stool, he should be fine. Just not to overuse the enemas on a regular basis. He was appreciative for the return call.

## 2020-10-10 ENCOUNTER — Ambulatory Visit: Payer: Medicare HMO | Admitting: Family

## 2020-10-10 ENCOUNTER — Other Ambulatory Visit: Payer: Self-pay | Admitting: Physician Assistant

## 2020-10-17 ENCOUNTER — Telehealth: Payer: Self-pay | Admitting: Cardiovascular Disease

## 2020-10-17 MED ORDER — POTASSIUM CHLORIDE ER 10 MEQ PO TBCR
10.0000 meq | EXTENDED_RELEASE_TABLET | Freq: Every day | ORAL | 6 refills | Status: DC | PRN
Start: 2020-10-17 — End: 2020-11-14

## 2020-10-17 NOTE — Telephone Encounter (Signed)
Sent refill to patient's pharmacy of choice.

## 2020-10-17 NOTE — Telephone Encounter (Signed)
*  STAT* If patient is at the pharmacy, call can be transferred to refill team.   1. Which medications need to be refilled? (please list name of each medication and dose if known) potassium chloride 10 meg as needed  2. Which pharmacy/location (including street and city if local pharmacy) is medication to be sent to? [facility]   3. Do they need a 30 day or 90 day supply? [place]

## 2020-10-27 ENCOUNTER — Telehealth: Payer: Self-pay | Admitting: Cardiovascular Disease

## 2020-10-27 NOTE — Telephone Encounter (Signed)
Called PCP office and left message for provider to please call in regards to patient request.

## 2020-10-27 NOTE — Telephone Encounter (Signed)
Spoke with patient and he is requesting something for depression and also something to help him sleep. Reviewed that he should check with his primary care provider to see if they can assist with these medications. Advised that I would reach out to his provider to see if they can assist with this. He was agreeable with this plan and had no further questions at this time.

## 2020-10-27 NOTE — Telephone Encounter (Signed)
Patient states he had open heart surgery and needs something for depression and to help him sleep. Please call to discuss.

## 2020-10-28 ENCOUNTER — Other Ambulatory Visit: Payer: Self-pay | Admitting: Cardiothoracic Surgery

## 2020-10-28 DIAGNOSIS — Z951 Presence of aortocoronary bypass graft: Secondary | ICD-10-CM

## 2020-10-28 NOTE — Telephone Encounter (Signed)
Spoke with patient and reviewed that he needs to call and schedule appointment with Cipriano Mile NP so that they can evaluate his post hospital stay. Reviewed phone number for her office and also mentioned that he could reach out to Dr. Prescott Gum. He states that he does have appointment with Dr. Prescott Gum tomorrow and requested that he discuss this with him as well. Reviewed importance of getting appointment scheduled with his PCP so that they can assist in his care and provided number to call that office. He verbalized understanding of our conversation, with no further questions at this time.

## 2020-10-29 ENCOUNTER — Other Ambulatory Visit: Payer: Self-pay

## 2020-10-29 ENCOUNTER — Ambulatory Visit
Admission: RE | Admit: 2020-10-29 | Discharge: 2020-10-29 | Disposition: A | Discharge status: Home / Self Care | Payer: Medicare HMO | Source: Ambulatory Visit | Attending: Cardiothoracic Surgery | Admitting: Cardiothoracic Surgery

## 2020-10-29 ENCOUNTER — Ambulatory Visit (INDEPENDENT_AMBULATORY_CARE_PROVIDER_SITE_OTHER): Payer: Self-pay | Admitting: Cardiothoracic Surgery

## 2020-10-29 ENCOUNTER — Encounter: Payer: Self-pay | Admitting: Cardiothoracic Surgery

## 2020-10-29 ENCOUNTER — Other Ambulatory Visit: Payer: Self-pay | Admitting: *Deleted

## 2020-10-29 VITALS — BP 120/70 | HR 90 | Temp 97.7°F | Resp 20 | Ht 71.0 in | Wt 235.0 lb

## 2020-10-29 DIAGNOSIS — Z951 Presence of aortocoronary bypass graft: Secondary | ICD-10-CM

## 2020-10-29 DIAGNOSIS — Z09 Encounter for follow-up examination after completed treatment for conditions other than malignant neoplasm: Secondary | ICD-10-CM

## 2020-10-29 DIAGNOSIS — I2510 Atherosclerotic heart disease of native coronary artery without angina pectoris: Secondary | ICD-10-CM

## 2020-10-29 MED ORDER — HYDROCORTISONE ACETATE 25 MG RE SUPP: 25.0000 mg | Freq: Two times a day (BID) | RECTAL | 0 refills | Status: DC

## 2020-10-29 MED ORDER — ESCITALOPRAM OXALATE 10 MG PO TABS
10.0000 mg | ORAL_TABLET | Freq: Every day | ORAL | 1 refills | Status: DC
Start: 2020-10-29 — End: 2020-11-10

## 2020-10-29 NOTE — Progress Notes (Signed)
lexaproPCP is Cipriano Mile, NP Referring Provider is Minna Merritts, MD  Chief Complaint  Patient presents with  . Routine Post Op    f/u from surgery with CXR s/p CABG    HPI: Patient returns for scheduled 6-week follow-up after CABG x3 for unstable angina and application of left atrial clip for permanent atrial fibrillation.  The patient is stable without recurrent angina or symptoms of CHF.  Surgical incisions are well-healed.  I reviewed today's chest x-ray image and it shows clear lung fields without pleural effusion, sternal wires intact.  The patient's main complaints relate to depression and anorexia.  He has lost at least 15 pounds.  He has had problems with seasonal affect of disorder in the past.  He states he has little if any pleasure from his day-to-day activities.  He has had some crying spells but no emotional violence.  We will start him on a short course of Lexapro.  Past Medical History:  Diagnosis Date  . Atrial flutter, paroxysmal ([place])   . Carotid artery disease without cerebral infarction ([place])   . Coronary artery disease 2011  . Flatback syndrome of thoracolumbar region   . H/O calcium pyrophosphate deposition disease (CPPD)   . History of TIA (transient ischemic attack)   . Hyperlipidemia   . Paroxysmal A-fib ([place])   . Spinal stenosis of lumbar region without neurogenic claudication     Past Surgical History:  Procedure Laterality Date  . BILATERAL CARPAL TUNNEL RELEASE    . CARDIAC CATHETERIZATION    . CIRCUMCISION, NON-NEWBORN    . CLIPPING OF ATRIAL APPENDAGE N/A 09/18/2020   Procedure: CLIPPING OF ATRIAL APPENDAGE USING 39 ATRICURE LAA  EXCLUSION SYSTEM;  Surgeon: Ivin Poot, MD;  Location: [place];  Service: Open Heart Surgery;  Laterality: N/A;  . CORONARY ANGIOPLASTY WITH STENT PLACEMENT  2011  . CORONARY ARTERY BYPASS GRAFT N/A 09/18/2020   Procedure: CORONARY ARTERY BYPASS GRAFTING (CABG)X 3, ON PUMP, USING LEFT INTERAL MAMMARY ARTERY AND  ENDOSCOPICALLY HARVESTED RIGHT GREATER SAPHENOUS VEIN. LIMA TO LAD, SVG TO OM, SVG TO PD;  Surgeon: Ivin Poot, MD;  Location: [place];  Service: Open Heart Surgery;  Laterality: N/A;  . ENDOVEIN HARVEST OF GREATER SAPHENOUS VEIN Right 09/18/2020   Procedure: ENDOVEIN HARVEST OF GREATER SAPHENOUS VEIN;  Surgeon: Ivin Poot, MD;  Location: [place];  Service: Open Heart Surgery;  Laterality: Right;  . LEFT HEART CATH AND CORONARY ANGIOGRAPHY Left 09/12/2020   Procedure: LEFT HEART CATH AND CORONARY ANGIOGRAPHY;  Surgeon: Minna Merritts, MD;  Location: [place] CV [facility];  Service: Cardiovascular;  Laterality: Left;  . TEE WITHOUT CARDIOVERSION N/A 09/18/2020   Procedure: TRANSESOPHAGEAL ECHOCARDIOGRAM (TEE);  Surgeon: Prescott Gum, Collier Salina, MD;  Location: [place];  Service: Open Heart Surgery;  Laterality: N/A;    Family History  Problem Relation Age of Onset  . Heart disease Mother 51       CABG x 3   . Hypertension Father     Social History Social History   Tobacco Use  . Smoking status: Former Smoker    Packs/day: 0.25    Years: 40.00    Pack years: 10.00    Types: Cigarettes  . Smokeless tobacco: Never Used  Vaping Use  . Vaping Use: Never used  Substance Use Topics  . Alcohol use: Yes    Comment: rare   . Drug use: No    Current Outpatient Medications  Medication Sig Dispense Refill  .  aspirin EC 81 MG tablet Take 1 tablet (81 mg total) by mouth daily. (Patient taking differently: Take 81 mg by mouth 3 (three) times a week. ) 90 tablet 3  . calcium gluconate 500 MG tablet Take 500 mg by mouth at bedtime.     . carvedilol (COREG) 25 MG tablet Take 1 tablet (25 mg total) by mouth 2 (two) times daily with a meal. 60 tablet 3  . ELIQUIS 5 MG TABS tablet TAKE ONE TABLET TWICE DAILY (Patient taking differently: Take 5 mg by mouth 2 (two) times daily. ) 60 tablet 1  . ezetimibe (ZETIA) 10 MG tablet TAKE ONE (1) TABLET EACH DAY (Patient taking differently: Take 10 mg by mouth  daily. ) 30 tablet 3  . furosemide (LASIX) 40 MG tablet TAKE 1 TABLET BY MOUTH ONCE DAILY 7 tablet 0  . gabapentin (NEURONTIN) 400 MG capsule Take 400 mg by mouth 3 (three) times daily.     . Melatonin 5 MG TABS Take 5 mg by mouth at bedtime.     . nitroGLYCERIN (NITROSTAT) 0.4 MG SL tablet Place 1 tablet (0.4 mg total) under the tongue every 5 (five) minutes as needed for chest pain. 25 tablet 3  . potassium chloride (KLOR-CON) 10 MEQ tablet Take 1 tablet (10 mEq total) by mouth daily as needed. 30 tablet 6  . rosuvastatin (CRESTOR) 40 MG tablet Take 1 tablet (40 mg total) by mouth daily. (Patient taking differently: Take 40 mg by mouth every evening. ) 90 tablet 3  . traMADol (ULTRAM) 50 MG tablet Take 100 mg by mouth every 6 (six) hours as needed for moderate pain.      No current facility-administered medications for this visit.    Allergies  Allergen Reactions  . Propafenone Anxiety  . Codeine Nausea Only  . Penicillins Rash    Review of Systems   Weight loss related to depression and probably constipation Complains of hemorrhoids and perianal ulceration Some ankle edema which has responded to Lasix No sternal clicks or popping sensation  BP 120/70   Pulse 90   Temp 97.7 F (36.5 C) (Skin)   Resp 20   Ht 5\' 11"  (1.803 m)   Wt 235 lb (106.6 kg)   SpO2 97% Comment: RA  BMI 32.78 kg/m  Physical Exam      Exam    General- alert and comfortable.  Surgical incisions clean and dry.    Neck- no JVD, no cervical adenopathy palpable, no carotid bruit   Lungs- clear without rales, wheezes   Cor- regular rate and rhythm, no murmur , gallop   Abdomen- soft, non-tender   Extremities - warm, non-tender, minimal edema   Neuro- oriented, appropriate, no focal weakness   Diagnostic Tests: Chest x-ray is clear as noted above  Impression: Doing well after multivessel CABG but with some postoperative depression anorexia weight loss and problems related to  hemorrhoids  Plan: Start short course of oral Lexapro Anusol ointment called in for topical treatment of hemorrhoids Patient is cleared to drive and do light activities lifting up to 15 pounds Referral has been made for outpatient cardiac rehab at [facility] regional program Patient return in 4 weeks for review of progress.  Len Childs, MD [facility] [phone]

## 2020-11-03 ENCOUNTER — Telehealth: Payer: Self-pay | Admitting: *Deleted

## 2020-11-03 NOTE — Telephone Encounter (Signed)
Jeremy Reese called regarding his recent prescription of Lexapro. Pt left a vm stating Lexapro caused him to experience shortness of breath. In the message, pt states he wishes for something to help him sleep at night. Upon returning his call, pt had discussed recent symptoms with his PCP. Per pt, he received two new medication prescriptions for depression and insomnia. All questions answered.

## 2020-11-04 ENCOUNTER — Telehealth: Payer: Self-pay | Admitting: Cardiovascular Disease

## 2020-11-04 NOTE — Telephone Encounter (Signed)
Spoke with patient and recommended that he call open heart surgery office to check with them from their standpoint. He verbalized understanding with no further questions at this time.

## 2020-11-04 NOTE — Telephone Encounter (Signed)
Patient calling post open heart surgery from October. Patient would like to be advised on if he is safe to have the Covid booster shot as well as the flu shot.   Please advise

## 2020-11-07 ENCOUNTER — Telehealth: Payer: Self-pay | Admitting: *Deleted

## 2020-11-07 NOTE — Telephone Encounter (Signed)
Jeremy Reese was contacted by Cardiac Rehab to schedule his orientation today. While on the phone, he stated he needed to talk to someone now because he was feeling suicidal. His history of depression, behavioral health hospitalizations, and suicidal attempts were reviewed with patient. He currently does not have a plan. He is feeling very lonely and depressed after his CABG.  Staff encouraged him to come to the emergency room to receive extra help during this crisis, but he did not want to leave his family during the holiday season and felt he would be better once he wasn't alone when his wife returned this afternoon. Options were reviewed with the patient on the next step to get help and the decision to contact the [facility] was made.   This staff member contacted Jeremy Reese again this afternoon to touch base with him. He seemed much calmer and stated he did reach out to the [facility] and talked with them for over an hour. His wife had also returned so he was not alone. He stated he was going to call his doctor to explore more options to help in the future and that he now knew he could contact the [facility] anytime of day if needed. He has a telephone orientation with Cardiac Rehab on Monday 12/13 so staff will follow up then as well.

## 2020-11-10 ENCOUNTER — Emergency Department
Admission: EM | Admit: 2020-11-10 | Discharge: 2020-11-11 | Disposition: A | Discharge status: Home / Self Care | Payer: Medicare HMO | Attending: Student in an Organized Health Care Education/Training Program | Admitting: Student in an Organized Health Care Education/Training Program

## 2020-11-10 ENCOUNTER — Other Ambulatory Visit: Payer: Self-pay

## 2020-11-10 ENCOUNTER — Telehealth: Payer: Self-pay | Admitting: *Deleted

## 2020-11-10 DIAGNOSIS — Z951 Presence of aortocoronary bypass graft: Secondary | ICD-10-CM | POA: Diagnosis not present

## 2020-11-10 DIAGNOSIS — Z7982 Long term (current) use of aspirin: Secondary | ICD-10-CM | POA: Diagnosis not present

## 2020-11-10 DIAGNOSIS — I480 Paroxysmal atrial fibrillation: Secondary | ICD-10-CM | POA: Insufficient documentation

## 2020-11-10 DIAGNOSIS — Z950 Presence of cardiac pacemaker: Secondary | ICD-10-CM | POA: Diagnosis not present

## 2020-11-10 DIAGNOSIS — R45851 Suicidal ideations: Secondary | ICD-10-CM | POA: Diagnosis not present

## 2020-11-10 DIAGNOSIS — Z7901 Long term (current) use of anticoagulants: Secondary | ICD-10-CM | POA: Diagnosis not present

## 2020-11-10 DIAGNOSIS — Z87891 Personal history of nicotine dependence: Secondary | ICD-10-CM | POA: Diagnosis not present

## 2020-11-10 DIAGNOSIS — F332 Major depressive disorder, recurrent severe without psychotic features: Secondary | ICD-10-CM

## 2020-11-10 DIAGNOSIS — Z20822 Contact with and (suspected) exposure to covid-19: Secondary | ICD-10-CM | POA: Diagnosis not present

## 2020-11-10 DIAGNOSIS — I2510 Atherosclerotic heart disease of native coronary artery without angina pectoris: Secondary | ICD-10-CM | POA: Insufficient documentation

## 2020-11-10 DIAGNOSIS — I482 Chronic atrial fibrillation, unspecified: Secondary | ICD-10-CM | POA: Diagnosis present

## 2020-11-10 DIAGNOSIS — Z955 Presence of coronary angioplasty implant and graft: Secondary | ICD-10-CM | POA: Insufficient documentation

## 2020-11-10 DIAGNOSIS — I5032 Chronic diastolic (congestive) heart failure: Secondary | ICD-10-CM | POA: Diagnosis present

## 2020-11-10 DIAGNOSIS — Z046 Encounter for general psychiatric examination, requested by authority: Secondary | ICD-10-CM | POA: Diagnosis present

## 2020-11-10 DIAGNOSIS — E785 Hyperlipidemia, unspecified: Secondary | ICD-10-CM | POA: Diagnosis present

## 2020-11-10 LAB — COMPREHENSIVE METABOLIC PANEL
ALT: 42 U/L (ref 0–44)
AST: 33 U/L (ref 15–41)
Albumin: 4.2 g/dL (ref 3.5–5.0)
Alkaline Phosphatase: 65 U/L (ref 38–126)
Anion gap: 10 (ref 5–15)
BUN: 17 mg/dL (ref 8–23)
CO2: 25 mmol/L (ref 22–32)
Calcium: 9.6 mg/dL (ref 8.9–10.3)
Chloride: 100 mmol/L (ref 98–111)
Creatinine, Ser: 1.08 mg/dL (ref 0.61–1.24)
GFR, Estimated: >60 mL/min (ref >60)
Glucose, Bld: 115 mg/dL — ABNORMAL HIGH (ref 70–99)
Potassium: 4.3 mmol/L (ref 3.5–5.1)
Sodium: 135 mmol/L (ref 135–145)
Total Bilirubin: 1.2 mg/dL (ref 0.3–1.2)
Total Protein: 7.3 g/dL (ref 6.5–8.1)

## 2020-11-10 LAB — URINE DRUG SCREEN, QUALITATIVE (ARMC ONLY)
Amphetamines, Ur Screen: NOT DETECTED
Barbiturates, Ur Screen: NOT DETECTED
Benzodiazepine, Ur Scrn: POSITIVE — AB
Cannabinoid 50 Ng, Ur ~~LOC~~: NOT DETECTED
Cocaine Metabolite,Ur ~~LOC~~: NOT DETECTED
MDMA (Ecstasy)Ur Screen: NOT DETECTED
Methadone Scn, Ur: NOT DETECTED
Opiate, Ur Screen: NOT DETECTED
Phencyclidine (PCP) Ur S: NOT DETECTED
Tricyclic, Ur Screen: NOT DETECTED

## 2020-11-10 LAB — CBC
HCT: 43.5 % (ref 39.0–52.0)
Hemoglobin: 14.3 g/dL (ref 13.0–17.0)
MCH: 31.8 pg (ref 26.0–34.0)
MCHC: 32.9 g/dL (ref 30.0–36.0)
MCV: 96.9 fL (ref 80.0–100.0)
Platelets: 196 10*3/uL (ref 150–400)
RBC: 4.49 MIL/uL (ref 4.22–5.81)
RDW: 12.9 % (ref 11.5–15.5)
WBC: 8.9 10*3/uL (ref 4.0–10.5)
nRBC: 0 % (ref 0.0–0.2)

## 2020-11-10 LAB — RESP PANEL BY RT-PCR (FLU A&B, COVID) ARPGX2
Influenza A by PCR: NEGATIVE
Influenza B by PCR: NEGATIVE
SARS Coronavirus 2 by RT PCR: NEGATIVE

## 2020-11-10 LAB — SALICYLATE LEVEL: Salicylate Lvl: <7 mg/dL — ABNORMAL LOW (ref 7.0–30.0)

## 2020-11-10 LAB — ACETAMINOPHEN LEVEL: Acetaminophen (Tylenol), Serum: <10 ug/mL — ABNORMAL LOW (ref 10–30)

## 2020-11-10 LAB — ETHANOL: Alcohol, Ethyl (B): <10 mg/dL (ref <10)

## 2020-11-10 MED ORDER — ASPIRIN EC 81 MG PO TBEC
81.0000 mg | DELAYED_RELEASE_TABLET | Freq: Every day | ORAL | Status: DC
  Administered 2020-11-11: 81 mg via ORAL
  Filled 2020-11-10: qty 1

## 2020-11-10 MED ORDER — TRAMADOL HCL 50 MG PO TABS
50.0000 mg | ORAL_TABLET | Freq: Two times a day (BID) | ORAL | Status: DC | PRN
Start: 2020-11-10 — End: 2020-11-11
  Administered 2020-11-10: 50 mg via ORAL
  Filled 2020-11-10: qty 1

## 2020-11-10 MED ORDER — TEMAZEPAM 7.5 MG PO CAPS
15.0000 mg | ORAL_CAPSULE | Freq: Every day | ORAL | Status: DC
  Administered 2020-11-10: 15 mg via ORAL
  Filled 2020-11-10 (×2): qty 2

## 2020-11-10 MED ORDER — NITROGLYCERIN 0.4 MG SL SUBL: 0.4000 mg | SUBLINGUAL_TABLET | SUBLINGUAL | Status: DC | PRN

## 2020-11-10 MED ORDER — FUROSEMIDE 40 MG PO TABS
40.0000 mg | ORAL_TABLET | ORAL | Status: DC
  Filled 2020-11-10: qty 1

## 2020-11-10 MED ORDER — EZETIMIBE 10 MG PO TABS
10.0000 mg | ORAL_TABLET | Freq: Every day | ORAL | Status: DC
  Administered 2020-11-11: 10 mg via ORAL
  Filled 2020-11-10: qty 1

## 2020-11-10 MED ORDER — CARVEDILOL 25 MG PO TABS
25.0000 mg | ORAL_TABLET | Freq: Two times a day (BID) | ORAL | Status: DC
  Administered 2020-11-11 (×2): 25 mg via ORAL
  Filled 2020-11-10 (×4): qty 1

## 2020-11-10 MED ORDER — ROSUVASTATIN CALCIUM 20 MG PO TABS
40.0000 mg | ORAL_TABLET | Freq: Every day | ORAL | Status: DC
  Administered 2020-11-11: 40 mg via ORAL
  Filled 2020-11-10: qty 2

## 2020-11-10 MED ORDER — GABAPENTIN 300 MG PO CAPS
400.0000 mg | ORAL_CAPSULE | Freq: Three times a day (TID) | ORAL | Status: DC
  Administered 2020-11-10 – 2020-11-11 (×3): 400 mg via ORAL
  Filled 2020-11-10 (×3): qty 1

## 2020-11-10 MED ORDER — MELATONIN 5 MG PO TABS
5.0000 mg | ORAL_TABLET | Freq: Every day | ORAL | Status: DC
  Administered 2020-11-10: 5 mg via ORAL
  Filled 2020-11-10: qty 1

## 2020-11-10 MED ORDER — APIXABAN 5 MG PO TABS
5.0000 mg | ORAL_TABLET | Freq: Two times a day (BID) | ORAL | Status: DC
  Administered 2020-11-10 – 2020-11-11 (×2): 5 mg via ORAL
  Filled 2020-11-10 (×3): qty 1

## 2020-11-10 NOTE — Telephone Encounter (Signed)
Contacted [name] for the orientation phone call for Cardiac Rehab. He expressed the same severely depressed thoughts as the conversation from 12/10. Staff discussed safety with him and that he should call his PCP as soon as possible since he did not want to go to the ED. The phone call note from 12/10 was routed to his PCP's office and when called to ask if they received the fax, staff was told that Jeremy Reese was currently on the phone with one of their social workers. His orientation phone call will be rescheduled at a later date.

## 2020-11-10 NOTE — ED Notes (Signed)
Hourly rounding completed at this time, patient currently awake in room. No complaints, stable, and in no acute distress. Q15 minute rounds and monitoring via Rover and Officer to continue. °

## 2020-11-10 NOTE — ED Notes (Signed)
Report received from Annie, RN including Situation, Background, Assessment, and Recommendations. Patient alert and oriented, warm and dry, and in no acute distress. Patient denies SI, HI, AVH and pain. Patient made aware of Q15 minute rounds and Rover and Officer presence for their safety. Patient instructed to come to this nurse with needs or concerns. 

## 2020-11-10 NOTE — ED Notes (Addendum)
Pt belonging, green white plaid shirt, grey shirt,  baselball hat, white soaks, no underwear, navy blue crocs, black sweatpants, $43 in cash with black wallet and red Iphone, red plaid jacket

## 2020-11-10 NOTE — ED Notes (Signed)
On assessment pt stated several times he has a desire to leave. Pt answers no to HI, AVH, when asked SI pt states, "I just want to go home." Per Deneise Lever, RN from day shift, she stated that pt was told he cannot go home due to [facility] comments made, this nurse has reason to believe pt is attempting to be dishonest or not answer questions based on prior information.

## 2020-11-10 NOTE — ED Notes (Signed)
Hourly rounding completed at this time, patient currently asleep in room. No complaints, stable, and in no acute distress. Q15 minute rounds and monitoring via Rover and Officer to continue. 

## 2020-11-10 NOTE — ED Notes (Signed)
Pt states he does not need snack tonight due to just eating [place] sandwich and dinner tray

## 2020-11-10 NOTE — ED Triage Notes (Signed)
Pt states he had open heart surgery in October and since has been dealing with depression and is having thoughts of SI. Pt lives at home with his wife.

## 2020-11-10 NOTE — BH Assessment (Signed)
Assessment Note  Jeremy Reese is an 69 y.o. male. Per triage note Pt states he had open heart surgery in October and since has been dealing with depression and is having thoughts of SI. Pt lives at home with his Jeremy.   Pt resting quietly upon this writer's arrival. Pt presents with an unremarkable appearance. Pt was calm and cooperative throughout the interview. Pt was alert and oriented x4. Pt's speech was soft, and his thought processes were relevant. Motor behavior appears normal. Eye contact was poor as the patient did not make eye contact during the entire interview. Pt's mood is depressed; affect is flat. Patient was noted to have fair insight. Pt has impaired judgment as he was initially guarded and fixated on going home as the interview progressed the pt. was able to admit his suffering.  Pt is a high self-harm/suicide risk as he has 1 overdose attempt in his history. Pt reported that he is not connected to any mental health professionals as his former psychiatrist is on the verge of retirement. 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 The patient reported that this regimen had proven effective for years and identified his recent surgery as the cause of the reemergence of his depressive sx. Pt reported that his Jeremy and home health care aid are his supports. The pt. explained that his son removed the firearms from the home after he verbalized his "bad thoughts". The patient denies a hx of abuse. The patient admitted to symptoms of anxiety. Pt identifies his recent health issues as his main stressor. Pt also admitted to feelings of worthlessness and pity for himself post-surgery. The pt. reports a lack of motivation and reports that he spends his days in his recliner. The patient reports poor sleep, reporting that he receives 2-3 hours nightly. Patient also reports a decline in his  appetite and significant weight loss. The patient denied HI or AV/H.   Collateral contact Jeremy Reese [phone])- Jeremy confirmed that the pt.'s mental status declined after his surgery. Jeremy explained that the pt. reached out to his former doctor for recommendations about his depression resurgence and the pt. was informed that the pt.'s formal mental health provider is retiring next month. Despite this, the pt. was advised to restart using his light therapy box 2x per day for 45 minutes. Jeremy reported that the pt. cannot sleep, and his appetite has waned.    Diagnosis: Major Depressive Disorder with Seasonal Pattern  Past Medical History:  Past Medical History:  Diagnosis Date  . Atrial flutter, paroxysmal ([place])   . Carotid artery disease without cerebral infarction ([place])   . Coronary artery disease 2011  . Flatback syndrome of thoracolumbar region   . H/O calcium pyrophosphate deposition disease (CPPD)   . History of TIA (transient ischemic attack)   . Hyperlipidemia   . Paroxysmal A-fib ([place])   . Spinal stenosis of lumbar region without neurogenic claudication     Past Surgical History:  Procedure Laterality Date  . BILATERAL CARPAL TUNNEL RELEASE    . CARDIAC CATHETERIZATION    . CIRCUMCISION, NON-NEWBORN    . CLIPPING OF ATRIAL APPENDAGE N/A 09/18/2020   Procedure: CLIPPING OF ATRIAL APPENDAGE USING 40 ATRICURE LAA  EXCLUSION SYSTEM;  Surgeon: Ivin Poot, MD;  Location: [place];  Service: Open Heart Surgery;  Laterality: N/A;  . CORONARY ANGIOPLASTY WITH STENT  PLACEMENT  2011  . CORONARY ARTERY BYPASS GRAFT N/A 09/18/2020   Procedure: CORONARY ARTERY BYPASS GRAFTING (CABG)X 3, ON PUMP, USING LEFT INTERAL MAMMARY ARTERY AND ENDOSCOPICALLY HARVESTED RIGHT GREATER SAPHENOUS VEIN. LIMA TO LAD, SVG TO OM, SVG TO PD;  Surgeon: Ivin Poot, MD;  Location: [place];  Service: Open Heart Surgery;  Laterality: N/A;  . ENDOVEIN HARVEST OF GREATER SAPHENOUS VEIN Right  09/18/2020   Procedure: ENDOVEIN HARVEST OF GREATER SAPHENOUS VEIN;  Surgeon: Ivin Poot, MD;  Location: [place];  Service: Open Heart Surgery;  Laterality: Right;  . LEFT HEART CATH AND CORONARY ANGIOGRAPHY Left 09/12/2020   Procedure: LEFT HEART CATH AND CORONARY ANGIOGRAPHY;  Surgeon: Minna Merritts, MD;  Location: [place] [facility];  Service: Cardiovascular;  Laterality: Left;  . TEE WITHOUT CARDIOVERSION N/A 09/18/2020   Procedure: TRANSESOPHAGEAL ECHOCARDIOGRAM (TEE);  Surgeon: Prescott Gum, Collier Salina, MD;  Location: [place];  Service: Open Heart Surgery;  Laterality: N/A;    Family History:  Family History  Problem Relation Age of Onset  . Heart disease Mother 65       CABG x 3   . Hypertension Father     Social History:  reports that he has quit smoking. His smoking use included cigarettes. He has a 10.00 pack-year smoking history. He has never used smokeless tobacco. He reports current alcohol use. He reports that he does not use drugs.  Additional Social History:  Alcohol / Drug Use Pain Medications: See PTA Prescriptions: See PTA History of alcohol / drug use?: No history of alcohol / drug abuse  CIWA: CIWA-Ar BP: 135/70 Pulse Rate: (!) 110 COWS:    Allergies:  Allergies  Allergen Reactions  . Propafenone Anxiety  . Codeine Nausea Only  . Penicillins Rash    Home Medications: (Not in a hospital admission)   OB/GYN Status:  No LMP for male patient.  General Assessment Data Location of Assessment: [facility] ED TTS Assessment: In system Is this a Tele or Face-to-Face Assessment?: Face-to-Face Is this an Initial Assessment or a Re-assessment for this encounter?: Initial Assessment Patient Accompanied by:: N/A Language Other than English: No Living Arrangements: Other (Comment) What gender do you identify as?: Male Date Telepsych consult ordered in [facility]: 11/10/20 Time Telepsych consult ordered in [facility]: [place] Marital status: Married [place] name: N/A Pregnancy  Status: No Living Arrangements: Spouse/significant other Can pt return to current living arrangement?: Yes Admission Status: Involuntary Petitioner: ED Attending Is patient capable of signing voluntary admission?: No Referral Source: Self/Family/Friend Insurance type: [employer] Medicare HMO  Medical Screening Exam ([place]) Medical Exam completed: Yes  Crisis Care Plan Living Arrangements: Spouse/significant other Legal Guardian: Other: (Self) Name of Psychiatrist: None noted Name of Therapist: None noted  Education Status Is patient currently in school?: No Is the patient employed, unemployed or receiving disability?:  (Pt is retired)  Risk to self with the past 6 months Suicidal Ideation: No Has patient been a risk to self within the past 6 months prior to admission? : No Suicidal Intent: No Has patient had any suicidal intent within the past 6 months prior to admission? : Yes Is patient at risk for suicide?: Yes Suicidal Plan?: No Has patient had any suicidal plan within the past 6 months prior to admission? : Yes Access to Means: Yes Specify Access to Suicidal Means: Pt has access to medications What has been your use of drugs/alcohol within the last 12 months?: None Previous Attempts/Gestures: Yes How many times?: 1  Other Self Harm Risks: Pt has a depression dx Triggers for Past Attempts: None known Intentional Self Injurious Behavior: None Family Suicide History: No Recent stressful life event(s): Recent negative physical changes (Pt had recent triple bipass surgery) Persecutory voices/beliefs?: No Depression: Yes Depression Symptoms: Loss of interest in usual pleasures,Feeling worthless/self pity,Insomnia Substance abuse history and/or treatment for substance abuse?: No Suicide prevention information given to non-admitted patients: Not applicable  Risk to Others within the past 6 months Homicidal Ideation: No Does patient have any lifetime risk of violence  toward others beyond the six months prior to admission? : No Thoughts of Harm to Others: No Current Homicidal Intent: No Current Homicidal Plan: No Access to Homicidal Means: No Identified Victim: n/a History of harm to others?: No Assessment of Violence: None Noted Does patient have access to weapons?: No Criminal Charges Pending?: No Does patient have a court date: No Is patient on probation?: No  Psychosis Hallucinations: None noted Delusions: None noted  Mental Status Report Appearance/Hygiene: In scrubs Eye Contact: Poor Motor Activity: Freedom of movement Speech: Logical/coherent Level of Consciousness: Quiet/awake Mood: Depressed Affect: Flat,Depressed Anxiety Level: Moderate Thought Processes: Coherent,Relevant Judgement: Impaired Orientation: Place,Person,Time,Situation Obsessive Compulsive Thoughts/Behaviors: Unable to Assess  Cognitive Functioning Concentration: Normal Memory: Remote Intact,Recent Intact Is patient IDD: No Insight: Fair Impulse Control: Good Appetite: Fair Have you had any weight changes? : Loss Amount of the weight change? (lbs): 25 lbs Sleep: Decreased Total Hours of Sleep: 2 Vegetative Symptoms: Decreased grooming  ADLScreening [facility] Assessment Services) Patient's cognitive ability adequate to safely complete daily activities?: Yes Patient able to express need for assistance with ADLs?: Yes Independently performs ADLs?: Yes (appropriate for developmental age)  Prior Inpatient Therapy Prior Inpatient Therapy: No  Prior Outpatient Therapy Prior Outpatient Therapy: Yes Prior Therapy Dates: 1992 Prior Therapy Facilty/Provider(s): Dr. Thurmond Butts Reason for Treatment: Seasonal Affective Disorder Does patient have an ACCT team?: No Does patient have Intensive In-House Services?  : No Does patient have [facility] services? : No Does patient have P4CC services?: No  ADL Screening (condition at time of admission) Patient's cognitive ability  adequate to safely complete daily activities?: Yes Is the patient deaf or have difficulty hearing?: No Does the patient have difficulty seeing, even when wearing glasses/contacts?: No Does the patient have difficulty concentrating, remembering, or making decisions?: No Patient able to express need for assistance with ADLs?: Yes Does the patient have difficulty dressing or bathing?: No Independently performs ADLs?: Yes (appropriate for developmental age) Does the patient have difficulty walking or climbing stairs?: No Weakness of Legs: None Weakness of Arms/Hands: None  Home Assistive Devices/Equipment Home Assistive Devices/Equipment: None  Therapy Consults (therapy consults require a physician order) PT Evaluation Needed: No OT Evalulation Needed: No SLP Evaluation Needed: No Abuse/Neglect Assessment (Assessment to be complete while patient is alone) Abuse/Neglect Assessment Can Be Completed: Yes Physical Abuse: Denies Verbal Abuse: Denies Sexual Abuse: Denies Exploitation of patient/patient's resources: Denies Self-Neglect: Denies Values / Beliefs Cultural Requests During Hospitalization: None Spiritual Requests During Hospitalization: None Consults Spiritual Care Consult Needed: No Transition of Care Team Consult Needed: No Advance Directives (For Healthcare) Does Patient Have a Medical Advance Directive?: No Would patient like information on creating a medical advance directive?: No - Patient declined          Disposition: Pending psych consult. Disposition Initial Assessment Completed for this Encounter: Yes  On Site Evaluation by:   Reviewed with Physician:    Kathi Ludwig 11/10/2020 10:32 PM

## 2020-11-10 NOTE — ED Notes (Addendum)
Hourly rounding completed at this time, patient currently awake in room. Requests for sleep aid, stable, and in no acute distress. Q15 minute rounds and monitoring via Engineer, drilling to continue. MD Quentin Cornwall notified of request.

## 2020-11-10 NOTE — ED Notes (Signed)
Pt updated on night meds in place, complained of back pain, PRN to be administered.

## 2020-11-10 NOTE — ED Provider Notes (Signed)
[facility] Emergency Department Provider Note    Event Date/Time   First MD Initiated Contact with Patient 11/10/20 1802     (approximate)  I have reviewed the triage vital signs and the nursing notes.   HISTORY  Chief Complaint Psychiatric Evaluation    HPI Jeremy Reese is a 69 y.o. male with the below listed past medical history presents to the ER for evaluation of worsening depression symptoms over the past few weeks.  Had open heart surgery in October.  Was briefly on Lexapro but had shortness of breath and was unable to tolerate that medication.  States that over past several days his depression has gotten very severe and he is having thoughts of harming himself.  Was initially having plans of shooting himself but is family took the guns out of the household.  States that today he was feeling very unpredictable and jittery and feeling like he was going to overdose on sleeping pills.    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 [facility])    Coronary artery disease 2011   Flatback syndrome of thoracolumbar region    H/O calcium pyrophosphate deposition disease (CPPD)    History of TIA (transient ischemic attack)    Hyperlipidemia    Paroxysmal A-fib ([facility])    Spinal stenosis of lumbar region without neurogenic claudication    Family History  Problem Relation Age of Onset   Heart disease Mother 62       CABG x 3    Hypertension Father    Past Surgical History:  Procedure Laterality Date   BILATERAL CARPAL TUNNEL RELEASE     CARDIAC CATHETERIZATION     CIRCUMCISION, NON-NEWBORN     CLIPPING OF ATRIAL APPENDAGE N/A 09/18/2020   Procedure: CLIPPING OF ATRIAL APPENDAGE USING 56 ATRICURE LAA  EXCLUSION SYSTEM;  Surgeon: Ivin Poot, MD;  Location: [place];  Service: Open Heart Surgery;  Laterality: N/A;   CORONARY ANGIOPLASTY WITH STENT  PLACEMENT  2011   CORONARY ARTERY BYPASS GRAFT N/A 09/18/2020   Procedure: CORONARY ARTERY BYPASS GRAFTING (CABG)X 3, ON PUMP, USING LEFT INTERAL MAMMARY ARTERY AND ENDOSCOPICALLY HARVESTED RIGHT GREATER SAPHENOUS VEIN. LIMA TO LAD, SVG TO OM, SVG TO PD;  Surgeon: Ivin Poot, MD;  Location: [place];  Service: Open Heart Surgery;  Laterality: N/A;   ENDOVEIN HARVEST OF GREATER SAPHENOUS VEIN Right 09/18/2020   Procedure: ENDOVEIN HARVEST OF GREATER SAPHENOUS VEIN;  Surgeon: Ivin Poot, MD;  Location: [place];  Service: Open Heart Surgery;  Laterality: Right;   LEFT HEART CATH AND CORONARY ANGIOGRAPHY Left 09/12/2020   Procedure: LEFT HEART CATH AND CORONARY ANGIOGRAPHY;  Surgeon: Minna Merritts, MD;  Location: [place] [facility];  Service: Cardiovascular;  Laterality: Left;   TEE WITHOUT CARDIOVERSION N/A 09/18/2020   Procedure: TRANSESOPHAGEAL ECHOCARDIOGRAM (TEE);  Surgeon: Prescott Gum, Collier Salina, MD;  Location: [place];  Service: Open Heart Surgery;  Laterality: N/A;   Patient Active Problem List   Diagnosis Date Noted   Postop check 10/29/2020   S/P CABG x 3 09/18/2020   Chest pain    Unstable angina ([place]) 09/12/2020   PAD (peripheral artery disease) ([place]) 09/12/2020   CAD, multiple vessel 09/12/2020   3-vessel CAD 07/25/2017   Chronic diastolic congestive heart failure ([place]) 04/29/2015   Hyperlipidemia 04/01/2015   Lumbago 04/01/2015   Carotid stenosis 04/01/2015   Chronic atrial fibrillation (  [place]) 04/01/2015      Prior to Admission medications   Medication Sig Start Date End Date Taking? Authorizing Provider  aspirin EC 81 MG tablet Take 1 tablet (81 mg total) by mouth daily. Patient taking differently: Take 81 mg by mouth 3 (three) times a week.  12/05/18   Minna Merritts, MD  calcium gluconate 500 MG tablet Take 500 mg by mouth at bedtime.     [provider]  carvedilol (COREG) 25 MG tablet Take 1 tablet (25 mg total) by mouth 2 (two) times  daily with a meal. 09/23/20   Barrett, Erin R, PA-C  ELIQUIS 5 MG TABS tablet TAKE ONE TABLET TWICE DAILY Patient taking differently: Take 5 mg by mouth 2 (two) times daily.  05/01/18   Minna Merritts, MD  escitalopram (LEXAPRO) 10 MG tablet Take 1 tablet (10 mg total) by mouth at bedtime. 10/29/20   Ivin Poot, MD  ezetimibe (ZETIA) 10 MG tablet TAKE ONE (1) TABLET EACH DAY Patient taking differently: Take 10 mg by mouth daily.  02/17/18   Minna Merritts, MD  furosemide (LASIX) 40 MG tablet TAKE 1 TABLET BY MOUTH ONCE DAILY 10/10/20   Prescott Gum, Collier Salina, MD  gabapentin (NEURONTIN) 400 MG capsule Take 400 mg by mouth 3 (three) times daily.     [provider]  hydrocortisone (ANUSOL-HC) 25 MG suppository Place 1 suppository (25 mg total) rectally 2 (two) times daily. 10/29/20   Ivin Poot, MD  Melatonin 5 MG TABS Take 5 mg by mouth at bedtime.     [provider]  nitroGLYCERIN (NITROSTAT) 0.4 MG SL tablet Place 1 tablet (0.4 mg total) under the tongue every 5 (five) minutes as needed for chest pain. 09/05/20   Loel Dubonnet, NP  potassium chloride (KLOR-CON) 10 MEQ tablet Take 1 tablet (10 mEq total) by mouth daily as needed. 10/17/20   Minna Merritts, MD  rosuvastatin (CRESTOR) 40 MG tablet Take 1 tablet (40 mg total) by mouth daily. Patient taking differently: Take 40 mg by mouth every evening.  09/05/20   Loel Dubonnet, NP  traMADol (ULTRAM) 50 MG tablet Take 100 mg by mouth every 6 (six) hours as needed for moderate pain.     [provider]    Allergies Propafenone, Codeine, and Penicillins    Social History Social History   Tobacco Use   Smoking status: Former Smoker    Packs/day: 0.25    Years: 40.00    Pack years: 10.00    Types: Cigarettes   Smokeless tobacco: Never Used  Scientific laboratory technician Use: Never used  Substance Use Topics   Alcohol use: Yes    Comment: rare    Drug use: No    Review of Systems Patient denies  headaches, rhinorrhea, blurry vision, numbness, shortness of breath, chest pain, edema, cough, abdominal pain, nausea, vomiting, diarrhea, dysuria, fevers, rashes or hallucinations unless otherwise stated above in HPI. ____________________________________________   PHYSICAL EXAM:  VITAL SIGNS: Vitals:   11/10/20 1750  BP: (!) 150/89  Pulse: (!) 58  Resp: 18  Temp: 98.6 F (37 C)  SpO2: 98%    Constitutional: Alert and oriented.  Eyes: Conjunctivae are normal.  Head: Atraumatic. Nose: No congestion/rhinnorhea. Mouth/Throat: Mucous membranes are moist.   Neck: No stridor. Painless ROM.  Cardiovascular: Normal rate, regular rhythm. Grossly normal heart sounds.  Good peripheral circulation. Respiratory: Normal respiratory effort.  No retractions. Lungs CTAB. Gastrointestinal: Soft and nontender.  No distention. No abdominal bruits. No CVA tenderness. Genitourinary:  Musculoskeletal: No lower extremity tenderness nor edema.  No joint effusions. Neurologic:  Normal speech and language. No gross focal neurologic deficits are appreciated. No facial droop Skin:  Skin is warm, dry and intact. No rash noted. Psychiatric: Mood and affect are normal. Speech and behavior are normal.  ____________________________________________   LABS (all labs ordered are listed, but only abnormal results are displayed)  Results for orders placed or performed during the hospital encounter of 11/10/20 (from the past 24 hour(s))  cbc     Status: None   Collection Time: 11/10/20  5:55 PM  Result Value Ref Range   WBC 8.9 4.0 - 10.5 K/uL   RBC 4.49 4.22 - 5.81 MIL/uL   Hemoglobin 14.3 13.0 - 17.0 g/dL   HCT 43.5 39.0 - 52.0 %   MCV 96.9 80.0 - 100.0 fL   MCH 31.8 26.0 - 34.0 pg   MCHC 32.9 30.0 - 36.0 g/dL   RDW 12.9 11.5 - 15.5 %   Platelets 196 150 - 400 K/uL   nRBC 0.0 0.0 - 0.2 %    ____________________________________________   ____________________________________________  RADIOLOGY   ____________________________________________   PROCEDURES  Procedure(s) performed:  Procedures    Critical Care performed: no ____________________________________________   INITIAL IMPRESSION / ASSESSMENT AND PLAN / ED COURSE  Pertinent labs & imaging results that were available during my care of the patient were reviewed by me and considered in my medical decision making (see chart for details).   DDX: Psychosis, delirium, medication effect, noncompliance, polysubstance abuse, Si, Hi, depression   Jeremy Reese is a 69 y.o. who presents to the ED with for evaluation of depression and SI.  Patient has psych history of seasonal affective disorder (per patient).  Laboratory testing was ordered to evaluation for underlying electrolyte derangement or signs of underlying organic pathology to explain today's presentation.  Based on history and physical and laboratory evaluation, it appears that the patient's presentation is 2/2 underlying psychiatric disorder and will require further evaluation and management by inpatient psychiatry.  Patient was  made an IVC due to SI with plan.  Disposition pending psychiatric evaluation.  The patient has been placed in psychiatric observation due to the need to provide a safe environment for the patient while obtaining psychiatric consultation and evaluation, as well as ongoing medical and medication management to treat the patient's condition.  The patient has been placed under full IVC at this time.     The patient was evaluated in Emergency Department today for the symptoms described in the history of present illness. He/she was evaluated in the context of the global COVID-19 pandemic, which necessitated consideration that the patient might be at risk for infection with the SARS-CoV-2 virus that causes COVID-19. Institutional protocols  and algorithms that pertain to the evaluation of patients at risk for COVID-19 are in a state of rapid change based on information released by regulatory bodies including the CDC and federal and state organizations. These policies and algorithms were followed during the patient's care in the ED.  As part of my medical decision making, I reviewed the following data within the [place] notes reviewed and incorporated, Labs reviewed, notes from prior ED visits and [place] Controlled Substance Database   ____________________________________________   FINAL CLINICAL IMPRESSION(S) / ED DIAGNOSES  Final diagnoses:  Suicidal ideation      NEW MEDICATIONS STARTED DURING THIS VISIT:  New Prescriptions  No medications on file     Note:  This document was prepared using Dragon voice recognition software and may include unintentional dictation errors.    Merlyn Lot, MD 11/10/20 1827

## 2020-11-11 ENCOUNTER — Encounter: Payer: Self-pay | Admitting: Psychiatry

## 2020-11-11 ENCOUNTER — Other Ambulatory Visit: Payer: Self-pay

## 2020-11-11 ENCOUNTER — Inpatient Hospital Stay
Admission: AD | Admit: 2020-11-11 | Discharge: 2020-11-14 | DRG: 885 | Disposition: A | Discharge status: Home / Self Care | Payer: Medicare HMO | Source: Intra-hospital | Attending: Behavioral Health | Admitting: Behavioral Health

## 2020-11-11 DIAGNOSIS — I2510 Atherosclerotic heart disease of native coronary artery without angina pectoris: Secondary | ICD-10-CM | POA: Diagnosis present

## 2020-11-11 DIAGNOSIS — Z9151 Personal history of suicidal behavior: Secondary | ICD-10-CM | POA: Diagnosis not present

## 2020-11-11 DIAGNOSIS — I480 Paroxysmal atrial fibrillation: Secondary | ICD-10-CM | POA: Diagnosis present

## 2020-11-11 DIAGNOSIS — F419 Anxiety disorder, unspecified: Secondary | ICD-10-CM | POA: Diagnosis present

## 2020-11-11 DIAGNOSIS — F332 Major depressive disorder, recurrent severe without psychotic features: Secondary | ICD-10-CM | POA: Diagnosis present

## 2020-11-11 DIAGNOSIS — Z888 Allergy status to other drugs, medicaments and biological substances status: Secondary | ICD-10-CM

## 2020-11-11 DIAGNOSIS — Z79899 Other long term (current) drug therapy: Secondary | ICD-10-CM | POA: Diagnosis not present

## 2020-11-11 DIAGNOSIS — R45851 Suicidal ideations: Secondary | ICD-10-CM | POA: Diagnosis present

## 2020-11-11 DIAGNOSIS — Z955 Presence of coronary angioplasty implant and graft: Secondary | ICD-10-CM | POA: Diagnosis not present

## 2020-11-11 DIAGNOSIS — M4035 Flatback syndrome, thoracolumbar region: Secondary | ICD-10-CM | POA: Diagnosis present

## 2020-11-11 DIAGNOSIS — Z885 Allergy status to narcotic agent status: Secondary | ICD-10-CM | POA: Diagnosis not present

## 2020-11-11 DIAGNOSIS — Z8673 Personal history of transient ischemic attack (TIA), and cerebral infarction without residual deficits: Secondary | ICD-10-CM | POA: Diagnosis not present

## 2020-11-11 DIAGNOSIS — M48061 Spinal stenosis, lumbar region without neurogenic claudication: Secondary | ICD-10-CM | POA: Diagnosis present

## 2020-11-11 DIAGNOSIS — M1990 Unspecified osteoarthritis, unspecified site: Secondary | ICD-10-CM | POA: Diagnosis present

## 2020-11-11 DIAGNOSIS — I10 Essential (primary) hypertension: Secondary | ICD-10-CM | POA: Diagnosis present

## 2020-11-11 DIAGNOSIS — E785 Hyperlipidemia, unspecified: Secondary | ICD-10-CM | POA: Diagnosis present

## 2020-11-11 DIAGNOSIS — Z7982 Long term (current) use of aspirin: Secondary | ICD-10-CM | POA: Diagnosis not present

## 2020-11-11 DIAGNOSIS — Z951 Presence of aortocoronary bypass graft: Secondary | ICD-10-CM

## 2020-11-11 DIAGNOSIS — Z8249 Family history of ischemic heart disease and other diseases of the circulatory system: Secondary | ICD-10-CM | POA: Diagnosis not present

## 2020-11-11 DIAGNOSIS — Z880 Allergy status to penicillin: Secondary | ICD-10-CM | POA: Diagnosis not present

## 2020-11-11 DIAGNOSIS — Z87891 Personal history of nicotine dependence: Secondary | ICD-10-CM

## 2020-11-11 DIAGNOSIS — Z7902 Long term (current) use of antithrombotics/antiplatelets: Secondary | ICD-10-CM | POA: Diagnosis not present

## 2020-11-11 DIAGNOSIS — I4892 Unspecified atrial flutter: Secondary | ICD-10-CM | POA: Diagnosis present

## 2020-11-11 DIAGNOSIS — I482 Chronic atrial fibrillation, unspecified: Secondary | ICD-10-CM | POA: Diagnosis present

## 2020-11-11 LAB — URINALYSIS, COMPLETE (UACMP) WITH MICROSCOPIC
Bacteria, UA: NONE SEEN
Bilirubin Urine: NEGATIVE
Glucose, UA: NEGATIVE mg/dL
Hgb urine dipstick: NEGATIVE
Ketones, ur: NEGATIVE mg/dL
Leukocytes,Ua: NEGATIVE
Nitrite: NEGATIVE
Protein, ur: 30 mg/dL — AB
Specific Gravity, Urine: 1.018 (ref 1.005–1.030)
Squamous Epithelial / HPF: NONE SEEN (ref 0–5)
pH: 6 (ref 5.0–8.0)

## 2020-11-11 MED ORDER — FUROSEMIDE 40 MG PO TABS: 40.0000 mg | ORAL_TABLET | ORAL | Status: DC

## 2020-11-11 MED ORDER — ALUM & MAG HYDROXIDE-SIMETH 200-200-20 MG/5ML PO SUSP: 30.0000 mL | ORAL | Status: DC | PRN

## 2020-11-11 MED ORDER — NITROGLYCERIN 0.4 MG SL SUBL: 0.4000 mg | SUBLINGUAL_TABLET | SUBLINGUAL | Status: DC | PRN

## 2020-11-11 MED ORDER — GABAPENTIN 400 MG PO CAPS
400.0000 mg | ORAL_CAPSULE | Freq: Three times a day (TID) | ORAL | Status: DC
  Administered 2020-11-12 – 2020-11-14 (×7): 400 mg via ORAL
  Filled 2020-11-11 (×7): qty 1

## 2020-11-11 MED ORDER — APIXABAN 5 MG PO TABS
5.0000 mg | ORAL_TABLET | Freq: Two times a day (BID) | ORAL | Status: DC
  Administered 2020-11-12 – 2020-11-14 (×5): 5 mg via ORAL
  Filled 2020-11-11 (×6): qty 1

## 2020-11-11 MED ORDER — FLUOXETINE HCL 20 MG PO CAPS
20.0000 mg | ORAL_CAPSULE | Freq: Every day | ORAL | Status: DC
  Administered 2020-11-12: 20 mg via ORAL
  Filled 2020-11-11: qty 1

## 2020-11-11 MED ORDER — ROSUVASTATIN CALCIUM 20 MG PO TABS
40.0000 mg | ORAL_TABLET | Freq: Every day | ORAL | Status: DC
  Administered 2020-11-12 – 2020-11-14 (×3): 40 mg via ORAL
  Filled 2020-11-11 (×3): qty 2

## 2020-11-11 MED ORDER — MELATONIN 5 MG PO TABS
5.0000 mg | ORAL_TABLET | Freq: Every day | ORAL | Status: DC
  Administered 2020-11-11 – 2020-11-13 (×3): 5 mg via ORAL
  Filled 2020-11-11 (×3): qty 1

## 2020-11-11 MED ORDER — ASPIRIN EC 81 MG PO TBEC
81.0000 mg | DELAYED_RELEASE_TABLET | Freq: Every day | ORAL | Status: DC
  Administered 2020-11-12 – 2020-11-14 (×3): 81 mg via ORAL
  Filled 2020-11-11 (×3): qty 1

## 2020-11-11 MED ORDER — TRAMADOL HCL 50 MG PO TABS
50.0000 mg | ORAL_TABLET | Freq: Two times a day (BID) | ORAL | Status: DC | PRN
  Administered 2020-11-12: 50 mg via ORAL
  Administered 2020-11-14: 100 mg via ORAL
  Filled 2020-11-11: qty 2
  Filled 2020-11-11: qty 1

## 2020-11-11 MED ORDER — TEMAZEPAM 15 MG PO CAPS
15.0000 mg | ORAL_CAPSULE | Freq: Every day | ORAL | Status: DC
  Administered 2020-11-11 – 2020-11-13 (×3): 15 mg via ORAL
  Filled 2020-11-11 (×3): qty 1

## 2020-11-11 MED ORDER — FLUOXETINE HCL 20 MG PO CAPS
20.0000 mg | ORAL_CAPSULE | Freq: Every day | ORAL | Status: DC
  Administered 2020-11-11: 20 mg via ORAL
  Filled 2020-11-11: qty 1

## 2020-11-11 MED ORDER — ACETAMINOPHEN 325 MG PO TABS
650.0000 mg | ORAL_TABLET | Freq: Four times a day (QID) | ORAL | Status: DC | PRN
  Administered 2020-11-12: 650 mg via ORAL
  Filled 2020-11-11: qty 2

## 2020-11-11 MED ORDER — CARVEDILOL 12.5 MG PO TABS
25.0000 mg | ORAL_TABLET | Freq: Two times a day (BID) | ORAL | Status: DC
  Administered 2020-11-12 – 2020-11-14 (×4): 25 mg via ORAL
  Filled 2020-11-11 (×5): qty 2

## 2020-11-11 MED ORDER — MAGNESIUM HYDROXIDE 400 MG/5ML PO SUSP: 30.0000 mL | Freq: Every day | ORAL | Status: DC | PRN

## 2020-11-11 MED ORDER — EZETIMIBE 10 MG PO TABS
10.0000 mg | ORAL_TABLET | Freq: Every day | ORAL | Status: DC
  Administered 2020-11-12 – 2020-11-14 (×3): 10 mg via ORAL
  Filled 2020-11-11 (×3): qty 1

## 2020-11-11 NOTE — ED Notes (Signed)
Hourly rounding completed at this time, patient currentlyawakein room, calm and cooperative. No complaints, stable, and in no acute distress. Q15 minute rounds and monitoring via Educational psychologist to continue.

## 2020-11-11 NOTE — Plan of Care (Signed)
Patient new to the unit tonight, hasn't had time to progress  Problem: Education: Goal: Knowledge of [facility] Education information/materials will improve Outcome: Not Progressing Goal: Emotional status will improve Outcome: Not Progressing Goal: Mental status will improve Outcome: Not Progressing Goal: Verbalization of understanding the information provided will improve Outcome: Not Progressing   Problem: Safety: Goal: Periods of time without injury will increase Outcome: Not Progressing   Problem: Education: Goal: Utilization of techniques to improve thought processes will improve Outcome: Not Progressing Goal: Knowledge of the prescribed therapeutic regimen will improve Outcome: Not Progressing   Problem: Safety: Goal: Ability to disclose and discuss suicidal ideas will improve Outcome: Not Progressing Goal: Ability to identify and utilize support systems that promote safety will improve Outcome: Not Progressing

## 2020-11-11 NOTE — ED Provider Notes (Signed)
EKG: AFib, ventricular rate 92.  QRS 82, QTc 440.  Atrial fibrillation, T wave abnormality noted in the inferior and lateral precordial leads, unchanged from previous on 11/8.   Duffy Bruce, MD 11/11/20 1257

## 2020-11-11 NOTE — Consult Note (Signed)
[facility] Face-to-Face Psychiatry Consult   Reason for Consult: Consult for 69 year old man came to the hospital for concerns about depression Referring Physician: Bland Span Patient Identification: Jeremy Reese MRN:  [id number] Principal Diagnosis: Severe recurrent major depression without psychotic features [facility]) Diagnosis:  Principal Problem:   Severe recurrent major depression without psychotic features ([place]) Active Problems:   Hyperlipidemia   Chronic atrial fibrillation ([place])   Chronic diastolic congestive heart failure ([place])   Total Time spent with patient: 1 hour  Subjective:   Jeremy Reese is a 69 y.o. male patient admitted with "I felt like I needed to protect myself from myself".  HPI: Patient seen chart reviewed.  69 year old man came voluntarily to the emergency room stating that he was having suicidal thoughts.  He had heart surgery back in October and a week or 2 afterwards started to develop depression.  Mood has been progressively worse.  Feels negative and down all the time.  Not sleeping very well.  Appetite a little bit less.  Denies having hallucinations denies paranoia.  Has been having more frequent suicidal thoughts.  Last night felt like he was having strong suicidal feelings and needed to come get back on medicine.  He currently is not seeing anyone for outpatient treatment and not taking any antidepressant medicine.  Patient does not report any stress other than the recent CABG.  Continues to live with his wife.  Does not have any social complaints.  No alcohol or drug abuse.  Past Psychiatric History: Patient has a past history of depression.  In 1992 he had a suicide attempt by overdose.  Had a hospitalization at that time.  Saw Dr. Thurmond Butts for years.  Much of the time diagnosed with seasonal affective disorder and took fluoxetine and used a light box in the winter.  Between that time and now had not had any other major depressive episodes.  No history of mania.  Risk  to Self: Suicidal Ideation: No Suicidal Intent: No Is patient at risk for suicide?: Yes Suicidal Plan?: No Access to Means: Yes Specify Access to Suicidal Means: Pt has access to medications What has been your use of drugs/alcohol within the last 12 months?: None How many times?: 1 Other Self Harm Risks: Pt has a depression dx Triggers for Past Attempts: None known Intentional Self Injurious Behavior: None Risk to Others: Homicidal Ideation: No Thoughts of Harm to Others: No Current Homicidal Intent: No Current Homicidal Plan: No Access to Homicidal Means: No Identified Victim: n/a History of harm to others?: No Assessment of Violence: None Noted Does patient have access to weapons?: No Criminal Charges Pending?: No Does patient have a court date: No Prior Inpatient Therapy: Prior Inpatient Therapy: No Prior Outpatient Therapy: Prior Outpatient Therapy: Yes Prior Therapy Dates: 1992 Prior Therapy Facilty/Provider(s): Dr. Thurmond Butts Reason for Treatment: Seasonal Affective Disorder Does patient have an ACCT team?: No Does patient have Intensive In-House Services?  : No Does patient have [facility] services? : No Does patient have P4CC services?: No  Past Medical History:  Past Medical History:  Diagnosis Date  . Atrial flutter, paroxysmal ([place])   . Carotid artery disease without cerebral infarction ([place])   . Coronary artery disease 2011  . Flatback syndrome of thoracolumbar region   . H/O calcium pyrophosphate deposition disease (CPPD)   . History of TIA (transient ischemic attack)   . Hyperlipidemia   . Paroxysmal A-fib ([place])   . Spinal stenosis of lumbar region without neurogenic claudication  Past Surgical History:  Procedure Laterality Date  . BILATERAL CARPAL TUNNEL RELEASE    . CARDIAC CATHETERIZATION    . CIRCUMCISION, NON-NEWBORN    . CLIPPING OF ATRIAL APPENDAGE N/A 09/18/2020   Procedure: CLIPPING OF ATRIAL APPENDAGE USING 30 ATRICURE LAA  EXCLUSION SYSTEM;   Surgeon: Ivin Poot, MD;  Location: [place];  Service: Open Heart Surgery;  Laterality: N/A;  . CORONARY ANGIOPLASTY WITH STENT PLACEMENT  2011  . CORONARY ARTERY BYPASS GRAFT N/A 09/18/2020   Procedure: CORONARY ARTERY BYPASS GRAFTING (CABG)X 3, ON PUMP, USING LEFT INTERAL MAMMARY ARTERY AND ENDOSCOPICALLY HARVESTED RIGHT GREATER SAPHENOUS VEIN. LIMA TO LAD, SVG TO OM, SVG TO PD;  Surgeon: Ivin Poot, MD;  Location: [place];  Service: Open Heart Surgery;  Laterality: N/A;  . ENDOVEIN HARVEST OF GREATER SAPHENOUS VEIN Right 09/18/2020   Procedure: ENDOVEIN HARVEST OF GREATER SAPHENOUS VEIN;  Surgeon: Ivin Poot, MD;  Location: [place];  Service: Open Heart Surgery;  Laterality: Right;  . LEFT HEART CATH AND CORONARY ANGIOGRAPHY Left 09/12/2020   Procedure: LEFT HEART CATH AND CORONARY ANGIOGRAPHY;  Surgeon: Minna Merritts, MD;  Location: [place] [facility];  Service: Cardiovascular;  Laterality: Left;  . TEE WITHOUT CARDIOVERSION N/A 09/18/2020   Procedure: TRANSESOPHAGEAL ECHOCARDIOGRAM (TEE);  Surgeon: Prescott Gum, Collier Salina, MD;  Location: [place];  Service: Open Heart Surgery;  Laterality: N/A;   Family History:  Family History  Problem Relation Age of Onset  . Heart disease Mother 65       CABG x 3   . Hypertension Father    Family Psychiatric  History: Does not know of any Social History:  Social History   Substance and Sexual Activity  Alcohol Use Yes   Comment: rare      Social History   Substance and Sexual Activity  Drug Use No    Social History   Socioeconomic History  . Marital status: Married    Spouse name: Not on file  . Number of children: Not on file  . Years of education: Not on file  . Highest education level: Not on file  Occupational History  . Not on file  Tobacco Use  . Smoking status: Former Smoker    Packs/day: 0.25    Years: 40.00    Pack years: 10.00    Types: Cigarettes  . Smokeless tobacco: Never Used  Vaping Use  . Vaping Use:  Never used  Substance and Sexual Activity  . Alcohol use: Yes    Comment: rare   . Drug use: No  . Sexual activity: Not on file  Other Topics Concern  . Not on file  Social History Narrative  . Not on file   Social Determinants of Health   Financial Resource Strain: Not on file  Food Insecurity: Not on file  Transportation Needs: Not on file  Physical Activity: Not on file  Stress: Not on file  Social Connections: Not on file   Additional Social History:    Allergies:   Allergies  Allergen Reactions  . Propafenone Anxiety  . Codeine Nausea Only  . Penicillins Rash    Labs:  Results for orders placed or performed during the hospital encounter of 11/10/20 (from the past 48 hour(s))  Comprehensive metabolic panel     Status: Abnormal   Collection Time: 11/10/20  5:55 PM  Result Value Ref Range   Sodium 135 135 - 145 mmol/L   Potassium 4.3 3.5 - 5.1 mmol/L  Chloride 100 98 - 111 mmol/L   CO2 25 22 - 32 mmol/L   Glucose, Bld 115 (H) 70 - 99 mg/dL    Comment: Glucose reference range applies only to samples taken after fasting for at least 8 hours.   BUN 17 8 - 23 mg/dL   Creatinine, Ser 1.08 0.61 - 1.24 mg/dL   Calcium 9.6 8.9 - 10.3 mg/dL   Total Protein 7.3 6.5 - 8.1 g/dL   Albumin 4.2 3.5 - 5.0 g/dL   AST 33 15 - 41 U/L   ALT 42 0 - 44 U/L   Alkaline Phosphatase 65 38 - 126 U/L   Total Bilirubin 1.2 0.3 - 1.2 mg/dL   GFR, Estimated >60 >60 mL/min    Comment: (NOTE) Calculated using the CKD-EPI Creatinine Equation (2021)    Anion gap 10 5 - 15    Comment: Performed at [facility], [address] [place], [place] [address]  Ethanol     Status: None   Collection Time: 11/10/20  5:55 PM  Result Value Ref Range   Alcohol, Ethyl (B) <10 <10 mg/dL    Comment: (NOTE) Lowest detectable limit for serum alcohol is 10 mg/dL.  For medical purposes only. Performed at [facility], [address] [place], [place] [address]   Salicylate level      Status: Abnormal   Collection Time: 11/10/20  5:55 PM  Result Value Ref Range   Salicylate Lvl <1.4 (L) 7.0 - 30.0 mg/dL    Comment: Performed at [facility], [address] [place], [place] [address]  Acetaminophen level     Status: Abnormal   Collection Time: 11/10/20  5:55 PM  Result Value Ref Range   Acetaminophen (Tylenol), Serum <10 (L) 10 - 30 ug/mL    Comment: (NOTE) Therapeutic concentrations vary significantly. A range of 10-30 ug/mL  may be an effective concentration for many patients. However, some  are best treated at concentrations outside of this range. Acetaminophen concentrations >150 ug/mL at 4 hours after ingestion  and >50 ug/mL at 12 hours after ingestion are often associated with  toxic reactions.  Performed at [facility], [address] [place], [place] [address]   cbc     Status: None   Collection Time: 11/10/20  5:55 PM  Result Value Ref Range   WBC 8.9 4.0 - 10.5 K/uL   RBC 4.49 4.22 - 5.81 MIL/uL   Hemoglobin 14.3 13.0 - 17.0 g/dL   HCT 43.5 39.0 - 52.0 %   MCV 96.9 80.0 - 100.0 fL   MCH 31.8 26.0 - 34.0 pg   MCHC 32.9 30.0 - 36.0 g/dL   RDW 12.9 11.5 - 15.5 %   Platelets 196 150 - 400 K/uL   nRBC 0.0 0.0 - 0.2 %    Comment: Performed at [facility], [address] [place], [place] [address]  Urine Drug Screen, Qualitative     Status: Abnormal   Collection Time: 11/10/20  5:55 PM  Result Value Ref Range   Tricyclic, Ur Screen NONE DETECTED NONE DETECTED   Amphetamines, Ur Screen NONE DETECTED NONE DETECTED   MDMA (Ecstasy)Ur Screen NONE DETECTED NONE DETECTED   Cocaine Metabolite,Ur [place] NONE DETECTED NONE DETECTED   Opiate, Ur Screen NONE DETECTED NONE DETECTED   Phencyclidine (PCP) Ur S NONE DETECTED NONE DETECTED   Cannabinoid 50 Ng, Ur [place] NONE DETECTED NONE DETECTED   Barbiturates, Ur Screen NONE DETECTED NONE DETECTED   Benzodiazepine, Ur  Scrn POSITIVE (A) NONE DETECTED   Methadone Scn, Ur NONE DETECTED  NONE DETECTED    Comment: (NOTE) Tricyclics + metabolites, urine    Cutoff 1000 ng/mL Amphetamines + metabolites, urine  Cutoff 1000 ng/mL MDMA (Ecstasy), urine              Cutoff 500 ng/mL Cocaine Metabolite, urine          Cutoff 300 ng/mL Opiate + metabolites, urine        Cutoff 300 ng/mL Phencyclidine (PCP), urine         Cutoff 25 ng/mL Cannabinoid, urine                 Cutoff 50 ng/mL Barbiturates + metabolites, urine  Cutoff 200 ng/mL Benzodiazepine, urine              Cutoff 200 ng/mL Methadone, urine                   Cutoff 300 ng/mL  The urine drug screen provides only a preliminary, unconfirmed analytical test result and should not be used for non-medical purposes. Clinical consideration and professional judgment should be applied to any positive drug screen result due to possible interfering substances. A more specific alternate chemical method must be used in order to obtain a confirmed analytical result. Gas chromatography / mass spectrometry (GC/MS) is the preferred confirm atory method. Performed at [facility], [address] [place], [place] [address]   Resp Panel by RT-PCR (Flu A&B, Covid) Nasopharyngeal Swab     Status: None   Collection Time: 11/10/20  6:27 PM   Specimen: Nasopharyngeal Swab; Nasopharyngeal(NP) swabs in vial transport medium  Result Value Ref Range   SARS Coronavirus 2 by RT PCR NEGATIVE NEGATIVE    Comment: (NOTE) SARS-CoV-2 target nucleic acids are NOT DETECTED.  The SARS-CoV-2 RNA is generally detectable in upper respiratory specimens during the acute phase of infection. The lowest concentration of SARS-CoV-2 viral copies this assay can detect is 138 copies/mL. A negative result does not preclude SARS-Cov-2 infection and should not be used as the sole basis for treatment or other patient management decisions. A negative result may occur with  improper specimen collection/handling, submission of specimen other than  nasopharyngeal swab, presence of viral mutation(s) within the areas targeted by this assay, and inadequate number of viral copies(<138 copies/mL). A negative result must be combined with clinical observations, patient history, and epidemiological information. The expected result is Negative.  Fact Sheet for Patients:  EntrepreneurPulse.com.au  Fact Sheet for Healthcare Providers:  IncredibleEmployment.be  This test is no t yet approved or cleared by the [place] FDA and  has been authorized for detection and/or diagnosis of SARS-CoV-2 by FDA under an Emergency Use Authorization (EUA). This EUA will remain  in effect (meaning this test can be used) for the duration of the COVID-19 declaration under Section 564(b)(1) of the Act, 21 U.S.C.section 360bbb-3(b)(1), unless the authorization is terminated  or revoked sooner.       Influenza A by PCR NEGATIVE NEGATIVE   Influenza B by PCR NEGATIVE NEGATIVE    Comment: (NOTE) The Xpert Xpress SARS-CoV-2/FLU/RSV plus assay is intended as an aid in the diagnosis of influenza from Nasopharyngeal swab specimens and should not be used as a sole basis for treatment. Nasal washings and aspirates are unacceptable for Xpert Xpress SARS-CoV-2/FLU/RSV testing.  Fact Sheet for Patients: EntrepreneurPulse.com.au  Fact Sheet for Healthcare Providers: IncredibleEmployment.be  This test is not yet approved or cleared  by the [place] and has been authorized for detection and/or diagnosis of SARS-CoV-2 by FDA under an Emergency Use Authorization (EUA). This EUA will remain in effect (meaning this test can be used) for the duration of the COVID-19 declaration under Section 564(b)(1) of the Act, 21 U.S.C. section 360bbb-3(b)(1), unless the authorization is terminated or revoked.  Performed at [facility], [address], [place], [place] [address]      Current Facility-Administered Medications  Medication Dose Route Frequency Provider Last Rate Last Admin  . apixaban (ELIQUIS) tablet 5 mg  5 mg Oral BID Merlyn Lot, MD   5 mg at 11/11/20 0956  . aspirin EC tablet 81 mg  81 mg Oral Daily Merlyn Lot, MD   81 mg at 11/11/20 0955  . carvedilol (COREG) tablet 25 mg  25 mg Oral BID WC Merlyn Lot, MD   25 mg at 11/11/20 0839  . ezetimibe (ZETIA) tablet 10 mg  10 mg Oral Daily Merlyn Lot, MD   10 mg at 11/11/20 0956  . furosemide (LASIX) tablet 40 mg  40 mg Oral Kennieth Francois, MD      . gabapentin (NEURONTIN) capsule 400 mg  400 mg Oral TID Merlyn Lot, MD   400 mg at 11/11/20 0955  . melatonin tablet 5 mg  5 mg Oral QHS Merlyn Lot, MD   5 mg at 11/10/20 2313  . nitroGLYCERIN (NITROSTAT) SL tablet 0.4 mg  0.4 mg Sublingual Q5 min PRN Merlyn Lot, MD      . rosuvastatin (CRESTOR) tablet 40 mg  40 mg Oral Daily Merlyn Lot, MD   40 mg at 11/11/20 0955  . temazepam (RESTORIL) capsule 15 mg  15 mg Oral QHS Merlyn Lot, MD   15 mg at 11/10/20 2312  . traMADol (ULTRAM) tablet 50-100 mg  50-100 mg Oral BID PRN Merlyn Lot, MD   50 mg at 11/10/20 2314   Current Outpatient Medications  Medication Sig Dispense Refill  . aspirin EC 81 MG tablet Take 1 tablet (81 mg total) by mouth daily. 90 tablet 3  . carvedilol (COREG) 25 MG tablet Take 1 tablet (25 mg total) by mouth 2 (two) times daily with a meal. 60 tablet 3  . ELIQUIS 5 MG TABS tablet TAKE ONE TABLET TWICE DAILY (Patient taking differently: Take 5 mg by mouth 2 (two) times daily.) 60 tablet 1  . ezetimibe (ZETIA) 10 MG tablet TAKE ONE (1) TABLET EACH DAY (Patient taking differently: Take 10 mg by mouth daily.) 30 tablet 3  . furosemide (LASIX) 40 MG tablet TAKE 1 TABLET BY MOUTH ONCE DAILY (Patient taking differently: Take 40 mg by mouth every other day.) 7 tablet 0  . gabapentin (NEURONTIN) 400 MG capsule Take 400 mg by mouth  3 (three) times daily.     . Melatonin 5 MG TABS Take 5 mg by mouth at bedtime.     . nitroGLYCERIN (NITROSTAT) 0.4 MG SL tablet Place 1 tablet (0.4 mg total) under the tongue every 5 (five) minutes as needed for chest pain. 25 tablet 3  . potassium chloride (KLOR-CON) 10 MEQ tablet Take 1 tablet (10 mEq total) by mouth daily as needed. (Patient taking differently: Take 10 mEq by mouth daily as needed (low potassium).) 30 tablet 6  . rosuvastatin (CRESTOR) 40 MG tablet Take 1 tablet (40 mg total) by mouth daily. 90 tablet 3  . temazepam (RESTORIL) 15 MG capsule Take 15 mg by mouth at bedtime.    Marland Kitchen  traMADol (ULTRAM) 50 MG tablet Take 50-100 mg by mouth 2 (two) times daily as needed for moderate pain.      Musculoskeletal: Strength & Muscle Tone: within normal limits Gait & Station: normal Patient leans: N/A  Psychiatric Specialty Exam: Physical Exam Vitals and nursing note reviewed.  Constitutional:      Appearance: He is well-developed and well-nourished.  HENT:     Head: Normocephalic and atraumatic.  Eyes:     Conjunctiva/sclera: Conjunctivae normal.     Pupils: Pupils are equal, round, and reactive to light.  Cardiovascular:     Heart sounds: Normal heart sounds.  Pulmonary:     Effort: Pulmonary effort is normal.  Abdominal:     Palpations: Abdomen is soft.  Musculoskeletal:        General: Normal range of motion.     Cervical back: Normal range of motion.  Skin:    General: Skin is warm and dry.  Neurological:     General: No focal deficit present.     Mental Status: He is alert.  Psychiatric:        Attention and Perception: He is inattentive.        Mood and Affect: Mood is depressed.        Speech: Speech is delayed.        Behavior: Behavior is slowed.        Thought Content: Thought content includes suicidal ideation.        Cognition and Memory: Cognition normal.        Judgment: Judgment normal.     Review of Systems  Constitutional: Negative.   HENT:  Negative.   Eyes: Negative.   Respiratory: Negative.   Cardiovascular: Negative.   Gastrointestinal: Negative.   Musculoskeletal: Negative.   Skin: Negative.   Neurological: Negative.   Psychiatric/Behavioral: Positive for dysphoric mood, sleep disturbance and suicidal ideas. The patient is nervous/anxious.     Blood pressure (!) 143/92, pulse 70, temperature 98 F (36.7 C), temperature source Oral, resp. rate 20, height 5\' 10"  (1.778 m), weight 104.3 kg, SpO2 99 %.Body mass index is 33 kg/m.  General Appearance: Casual  Eye Contact:  Good  Speech:  Clear and Coherent  Volume:  Normal  Mood:  Depressed  Affect:  Depressed  Thought Process:  Coherent  Orientation:  Full (Time, Place, and Person)  Thought Content:  Logical  Suicidal Thoughts:  Yes.  without intent/plan  Homicidal Thoughts:  No  Memory:  Immediate;   Fair Recent;   Fair Remote;   Fair  Judgement:  Fair  Insight:  Fair  Psychomotor Activity:  Normal  Concentration:  Concentration: Fair  Recall:  [employer] of Knowledge:  Fair  Language:  Fair  Akathisia:  No  Handed:  Right  AIMS (if indicated):     Assets:  Desire for Improvement Housing Resilience Social Support  ADL's:  Intact  Cognition:  WNL  Sleep:        Treatment Plan Summary: Daily contact with patient to assess and evaluate symptoms and progress in treatment, Medication management and Plan 69 year old man with major depression with suicidal ideation.  Past history of suicide attempts.  No psychosis.  Lucid agreeable to treatment.  Recommended to patient that the better solution I think would be admission to the hospital if we can.  Patient will be referred to hospitals by TTS.  See if geriatric psychiatry in particular has a bed.  We are currently short on beds at our  hospital.  Meanwhile try and continue him on his usual outpatient medicine and restart fluoxetine.  Disposition: Recommend psychiatric Inpatient admission when medically  cleared.  Alethia Berthold, MD 11/11/2020 11:22 AM

## 2020-11-11 NOTE — ED Notes (Signed)
Report called and given to [facility] in BMU. Patient to be transferred.

## 2020-11-11 NOTE — ED Provider Notes (Signed)
Emergency Medicine Observation Re-evaluation Note  Jeremy Reese is a 69 y.o. male, seen on rounds today.  Pt initially presented to the ED for complaints of Psychiatric Evaluation Currently, the patient is lying in bed, complains of difficulty sleeping.  Physical Exam  BP 135/70   Pulse (!) 110   Temp 97.7 F (36.5 C) (Oral)   Resp 18   Ht 5\' 10"  (1.778 m)   Wt 104.3 kg   SpO2 98%   BMI 33.00 kg/m  Physical Exam Constitutional: Resting comfortably. Eyes: Conjunctivae are normal. Head: Atraumatic. Nose: No congestion/rhinnorhea. Mouth/Throat: Mucous membranes are moist. Neck: Normal ROM Cardiovascular: No cyanosis noted. Respiratory: Normal respiratory effort. Gastrointestinal: Non-distended. Genitourinary: deferred Musculoskeletal: No lower extremity tenderness nor edema. Neurologic:  Normal speech and language. No gross focal neurologic deficits are appreciated. Skin:  Skin is warm, dry and intact. No rash noted.    ED Course / MDM  EKG:    I have reviewed the labs performed to date as well as medications administered while in observation.  No changes in the last 24 hours.  Plan  Current plan is for evaluation by psychiatry to determine appropriate disposition. Patient is under full IVC at this time.   Blake Divine, MD 11/11/20 [phone]

## 2020-11-11 NOTE — ED Notes (Signed)
Hourly rounding completed at this time, patient currently asleep in room. No complaints, stable, and in no acute distress. Q15 minute rounds and monitoring via Rover and Officer to continue. 

## 2020-11-11 NOTE — ED Notes (Signed)
Hourly rounding completed at this time, patient currently asleep in room. No complaints, stable, and in no acute distress. Q15 minute rounds and monitoring via Educational psychologist to continue.

## 2020-11-11 NOTE — BH Assessment (Addendum)
Patient can come down after 8pm  Call to give report: [phone]  Patient is to be admitted to [facility] by Dr. Domingo Cocking.  Attending Physician will be. Dr. Domingo Cocking.   Patient has been assigned to room 309, by [place], RN.   Intake Paper Work has been signed and placed on patient chart.  ER staff is aware of the admission: 1. [place], ER Secretary  2. Archie Balboa, ER MD  3. Amy B., Patient's Nurse  4. THO, Patient Access.

## 2020-11-11 NOTE — ED Notes (Signed)
IVC, pend BMU admit 

## 2020-11-11 NOTE — ED Notes (Signed)
Hourly rounding completed at this time, patient currently awake in room. No complaints, stable, and in no acute distress. Q15 minute rounds and monitoring via Rover and Officer to continue. °

## 2020-11-11 NOTE — ED Notes (Signed)

## 2020-11-11 NOTE — Progress Notes (Signed)
Patient admitted from [facility], report received from [place], [place]. Patient calm and pleasant during assessment. Patient denies SI/HI/AVH. Patient endorses depression and stated, "It has been getting worse since I had open heart surgery. I have not been on any medications for my depression since the early 90s." Patient given education, support, and encouragement to be active in his treatment plan. Patient oriented to the unit and his room. Patient being monitored Q 15 minutes for safety per unit protocol. Patient remains safe on the unit.

## 2020-11-11 NOTE — ED Notes (Signed)
Pt made aware he will likely transfer to BMU later tonight.

## 2020-11-11 NOTE — ED Notes (Signed)
Pt given breakfast tray

## 2020-11-11 NOTE — ED Notes (Signed)
Pt asleep at this time, unable to collect vitals. Will collect pt vitals once awake. 

## 2020-11-11 NOTE — ED Notes (Signed)
Pt given a cup of water 

## 2020-11-11 NOTE — Tx Team (Signed)
Initial Treatment Plan 11/11/2020 8:43 PM [name] VEZ:[id number]    PATIENT STRESSORS: Health problems Medication change or noncompliance   PATIENT STRENGTHS: Ability for insight Motivation for treatment/growth   PATIENT IDENTIFIED PROBLEMS: Depression  Suicidal Ideation                   DISCHARGE CRITERIA:  Improved stabilization in mood, thinking, and/or behavior Motivation to continue treatment in a less acute level of care  PRELIMINARY DISCHARGE PLAN: Outpatient therapy Return to previous living arrangement  PATIENT/FAMILY INVOLVEMENT: This treatment plan has been presented to and reviewed with the patient, Jeremy Reese. The patient has been given the opportunity to ask questions and make suggestions.  Mallie Darting, RN 11/11/2020, 8:43 PM

## 2020-11-11 NOTE — BH Assessment (Addendum)
Referral information for Psychiatric Hospitalization faxed to:  [facility] ([phone]---[phone]---[phone])  [facility] (-[phone] -or- [phone], [phone])  [facility] [phone] or [phone]),   .[facility] [phone] -or- [phone]),   -TTS requested UA and EKG from pt's current MD Ellender Hose) and RN (Amy B.) for [place] review

## 2020-11-12 ENCOUNTER — Other Ambulatory Visit: Payer: Self-pay | Admitting: Cardiothoracic Surgery

## 2020-11-12 DIAGNOSIS — F332 Major depressive disorder, recurrent severe without psychotic features: Principal | ICD-10-CM

## 2020-11-12 MED ORDER — FLUOXETINE HCL 20 MG PO CAPS
40.0000 mg | ORAL_CAPSULE | Freq: Every day | ORAL | Status: DC
  Administered 2020-11-13: 40 mg via ORAL
  Filled 2020-11-12: qty 2

## 2020-11-12 MED ORDER — FLUOXETINE HCL 20 MG PO CAPS: 40.0000 mg | ORAL_CAPSULE | Freq: Every day | ORAL | Status: DC

## 2020-11-12 MED ORDER — FLUOXETINE HCL 20 MG PO CAPS
20.0000 mg | ORAL_CAPSULE | Freq: Once | ORAL | Status: AC
  Administered 2020-11-12: 20 mg via ORAL
  Filled 2020-11-12: qty 1

## 2020-11-12 MED ORDER — PHENYLEPHRINE-MINERAL OIL-PET 0.25-14-74.9 % RE OINT
1.0000 "application " | TOPICAL_OINTMENT | Freq: Two times a day (BID) | RECTAL | Status: DC | PRN
  Administered 2020-11-12: 1 via RECTAL
  Filled 2020-11-12 (×2): qty 57

## 2020-11-12 NOTE — BHH Suicide Risk Assessment (Signed)
[facility] Admission Suicide Risk Assessment   Nursing information obtained from:  Patient Demographic factors:  Male Current Mental Status:  Suicidal ideation indicated by patient Loss Factors:  Decline in physical health Historical Factors:  NA Risk Reduction Factors:  NA  Total Time spent with patient: 1 hour Principal Problem: Severe recurrent major depression without psychotic features ([place]) Diagnosis:  Principal Problem:   Severe recurrent major depression without psychotic features ([place]) Active Problems:   Hyperlipidemia   Chronic atrial fibrillation (HCC)   S/P CABG x 3   Hypertension   Osteoarthritis   Spinal stenosis of lumbar region without neurogenic claudication  Subjective Data: 69 year old male with history of depression who presented voluntarily to emergency room for worsening depression and suicidal thoughts. Patient assessed during treatment team and again one-on-one in office. He reports that in the past he struggled with seasonal affective disorder and depression. He had a suicide attempt via taking a few tablets to overdose, and was placed on Prozac and light therapy. He reports this successfully treated his depression for many years until he no longer needed therapy in the early 90s. He believes this most recent episode of depression was triggered by recent CABG in October. He was briefly placed on Lexapro which he felt caused shortness of breath, and ceased taking. For the last two weeks depression has gradually worsened until he began having suicidal thoughts. He notes poor sleep, low appetite, hopelessness, and helplessness. He was restarted on Prozac yesterday in the emergency room, and denies any side effects to this medication. He is hopeful that this will improve his symptoms once more.   Continued Clinical Symptoms:  Alcohol Use Disorder Identification Test Final Score (AUDIT): 0 The "Alcohol Use Disorders Identification Test", Guidelines for Use in Primary Care,  Second Edition.  [facility]). Score between 0-7:  no or low risk or alcohol related problems. Score between 8-15:  moderate risk of alcohol related problems. Score between 16-19:  high risk of alcohol related problems. Score 20 or above:  warrants further diagnostic evaluation for alcohol dependence and treatment.   CLINICAL FACTORS:   Depression:   Anhedonia Hopelessness Insomnia Severe More than one psychiatric diagnosis Previous Psychiatric Diagnoses and Treatments Medical Diagnoses and Treatments/Surgeries   Musculoskeletal: Strength & Muscle Tone: within normal limits Gait & Station: ambulates with walker Patient leans: Front  Psychiatric Specialty Exam: Physical Exam Vitals and nursing note reviewed.  Constitutional:      Appearance: Normal appearance.  HENT:     Head: Normocephalic and atraumatic.     Right Ear: External ear normal.     Left Ear: External ear normal.     Nose: Nose normal.     Mouth/Throat:     Mouth: Mucous membranes are moist.     Pharynx: Oropharynx is clear.  Eyes:     Extraocular Movements: Extraocular movements intact.     Conjunctiva/sclera: Conjunctivae normal.     Pupils: Pupils are equal, round, and reactive to light.  Cardiovascular:     Rate and Rhythm: Normal rate.     Pulses: Normal pulses.  Pulmonary:     Effort: Pulmonary effort is normal.     Breath sounds: Normal breath sounds.  Abdominal:     General: Abdomen is flat.     Palpations: Abdomen is soft.  Musculoskeletal:     Cervical back: Normal range of motion and neck supple.     Comments: Patient ambulates with walker, back is hunched forward  Skin:  General: Skin is warm and dry.  Neurological:     General: No focal deficit present.     Mental Status: He is alert and oriented to person, place, and time.  Psychiatric:        Attention and Perception: Attention and perception normal.        Mood and Affect: Mood is depressed.        Speech:  Speech normal.        Behavior: Behavior is cooperative.        Thought Content: Thought content is not paranoid or delusional. Thought content includes suicidal ideation. Thought content does not include homicidal ideation. Thought content does not include suicidal plan.        Cognition and Memory: Cognition and memory normal.        Judgment: Judgment normal.     Review of Systems  Constitutional: Positive for activity change and fatigue.  HENT: Negative for rhinorrhea and sore throat.   Eyes: Positive for photophobia. Negative for visual disturbance.  Respiratory: Negative for cough and shortness of breath.   Cardiovascular: Negative for chest pain and palpitations.  Gastrointestinal: Negative for constipation, diarrhea, nausea and vomiting.  Endocrine: Negative for cold intolerance and heat intolerance.  Genitourinary: Positive for frequency. Negative for dysuria.  Musculoskeletal: Negative for arthralgias and joint swelling.  Skin: Negative for rash and wound.  Allergic/Immunologic: Negative for food allergies and immunocompromised state.  Neurological: Negative for dizziness and headaches.  Hematological: Negative for adenopathy. Does not bruise/bleed easily.  Psychiatric/Behavioral: Positive for dysphoric mood and suicidal ideas.    Blood pressure 112/72, pulse 70, temperature 97.6 F (36.4 C), temperature source Oral, resp. rate 17, height 5\' 10"  (1.778 m), weight 104.3 kg, SpO2 100 %.Body mass index is 32.99 kg/m.  General Appearance: Well Groomed  Eye Contact:  Good  Speech:  Clear and Coherent  Volume:  Normal  Mood:  Depressed and Dysphoric  Affect:  Congruent and Constricted  Thought Process:  Coherent and Linear  Orientation:  Full (Time, Place, and Person)  Thought Content:  Logical  Suicidal Thoughts:  Yes.  without intent/plan  Homicidal Thoughts:  No  Memory:  Immediate;   Fair Recent;   Fair Remote;   Fair  Judgement:  Good  Insight:  Good  Psychomotor  Activity:  Normal  Concentration:  Concentration: Fair and Attention Span: Fair  Recall:  [employer] of Knowledge:  Fair  Language:  Fair  Akathisia:  Negative  Handed:  Right  AIMS (if indicated):     Assets:  Communication Skills Desire for Improvement Financial Resources/Insurance Housing Intimacy Leisure Time Resilience Social Support  ADL's:  Intact  Cognition:  WNL  Sleep:  Number of Hours: 8.25      COGNITIVE FEATURES THAT CONTRIBUTE TO RISK:  None    SUICIDE RISK:   Moderate:  Frequent suicidal ideation with limited intensity, and duration, some specificity in terms of plans, no associated intent, good self-control, limited dysphoria/symptomatology, some risk factors present, and identifiable protective factors, including available and accessible social support.  PLAN OF CARE: Patient is a 69 year old male who presents voluntarily for worsening mood and suicidal ideation without plan or intent. Mood gradually worsening over last two weeks. Will increase Prozac to 40 mg daily. Continue home medications for chronic medical issues.   I certify that inpatient services furnished can reasonably be expected to improve the patient's condition.   [name], MD 11/12/2020, 12:02 PM

## 2020-11-12 NOTE — BHH Group Notes (Signed)
LCSW Group Therapy Note  11/12/2020 2:04 PM  Type of Therapy/Topic:  Group Therapy:  Emotion Regulation  Participation Level:  None   Description of Group:   The purpose of this group is to assist patients in learning to regulate negative emotions and experience positive emotions. Patients will be guided to discuss ways in which they have been vulnerable to their negative emotions. These vulnerabilities will be juxtaposed with experiences of positive emotions or situations, and patients will be challenged to use positive emotions to combat negative ones. Special emphasis will be placed on coping with negative emotions in conflict situations, and patients will process healthy conflict resolution skills.  Therapeutic Goals: 1. Patient will identify two positive emotions or experiences to reflect on in order to balance out negative emotions 2. Patient will label two or more emotions that they find the most difficult to experience 3. Patient will demonstrate positive conflict resolution skills through discussion and/or role plays  Summary of Patient Progress: Patient was in group, however did not engage in group discussion.    Therapeutic Modalities:   Cognitive Behavioral Therapy Feelings Identification Dialectical Behavioral Therapy  Assunta Curtis, MSW, LCSW 11/12/2020 2:04 PM

## 2020-11-12 NOTE — Progress Notes (Signed)
This Probation officer held patient's Carvedilol, his BP was 112/72 and HR 70. MD was notified.

## 2020-11-12 NOTE — Progress Notes (Signed)
Recreation Therapy Notes  Date: 11/12/2020  Time: 9:30 am   Location: Craft room  Behavioral response: Appropriate  Intervention Topic: Self-care    Discussion/Intervention:  Group content today was focused on Self-Care. The group defined self-care and some positive ways they care for themselves. Individuals expressed ways and reasons why they neglected any self-care in the past. Patients described ways to improve self-care in the future. The group explained what could happen if they did not do any self-care activities at all. The group participated in the intervention "self-care assessment" where they had a chance to discover some of their weaknesses and strengths in self- care. Patient came up with a self-care plan to improve themselves in the future.  Clinical Observations/Feedback: Patient came to group and defined self-care as trying to avoid dangerous situations. Individual was social with peers and staff participating in the intervention.  Tine Mabee LRT/CTRS         Maddock Finigan 11/12/2020 1:00 PM

## 2020-11-12 NOTE — BHH Counselor (Signed)
Adult Comprehensive Assessment  Patient ID: Jeremy Reese, male   DOB: 11-Jul-1951, 70 y.o.   MRN: [id number]  Information Source: Information source: Patient  Current Stressors:  Patient states their primary concerns and needs for treatment are:: "depression" Patient states their goals for this hospitilization and ongoing recovery are:: "get over the depression and go home" Educational / Learning stressors: Pt denies. Employment / Job issues: Pt denies. Family Relationships: Pt denies. Financial / Lack of resources (include bankruptcy): Pt denies. Housing / Lack of housing: Pt denies. Physical health (include injuries & life threatening diseases): "bad back" Social relationships: Pt denies. Substance abuse: Pt denies. Bereavement / Loss: Pt denies.  Living/Environment/Situation:  Living Arrangements: Spouse/significant other Who else lives in the home?: "wife" How long has patient lived in current situation?: "we've been in the home since 1987" What is atmosphere in current home: Comfortable,Loving,Supportive  Family History:  Marital status: Married Number of Years Married: 1 What types of issues is patient dealing with in the relationship?: Pt denies. Does patient have children?: Yes How many children?: 2 How is patient's relationship with their children?: "very good"  Childhood History:  By whom was/is the patient raised?: Both parents Description of patient's relationship with caregiver when they were a child: "fantastic and very lucky" Patient's description of current relationship with people who raised him/her: Pt reports that his parents are deceased. How were you disciplined when you got in trouble as a child/adolescent?: "my mom usually used a Radio broadcast assistant" Does patient have siblings?: No Did patient suffer any verbal/emotional/physical/sexual abuse as a child?: No Did patient suffer from severe childhood neglect?: No Has patient ever been sexually  abused/assaulted/raped as an adolescent or adult?: No Was the patient ever a victim of a crime or a disaster?: No Witnessed domestic violence?: No Has patient been affected by domestic violence as an adult?: No  Education:  Highest grade of school patient has completed: "BS from [employer]" Currently a student?: No Learning disability?: No  Employment/Work Situation:   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 If attempted suicide, did drugs/alcohol play a role in this?: No Alcohol/Substance Abuse Treatment Hx: Denies past history Has alcohol/substance abuse ever caused legal problems?: No  Social Support System:   Patient's Community Support System: Good Describe Community Support System: "wife, all my close friends" Type of faith/religion: "Christian" How does patient's faith help to cope with current illness?: "pray a lot"  Leisure/Recreation:   Do You Have Hobbies?: Yes Leisure and Hobbies: "used to do woodworking and fishing"  Strengths/Needs:   What is the patient's perception of their strengths?: "try to live by the [place] rule" Patient states they can use these personal strengths during their treatment to contribute to their recovery: Pt denies. Patient states these barriers may affect/interfere with their treatment: Pt denies. Patient states these barriers may affect their return to the community: Pt denies.  Discharge Plan:   Currently receiving community mental health services: No Patient states concerns and preferences for aftercare planning are: Pt reports that he wants to  look for outpatient therapy. Patient states they will know when they  are safe and ready for discharge when: "when I have a different outlook" Does patient have access to transportation?: Yes Does patient have financial barriers related to discharge medications?: No Will patient be returning to same living situation after discharge?: Yes  Summary/Recommendations:   Summary and Recommendations (to be completed by the evaluator): Patient is 69 year old married male from [place], [place] [facility]  He reports that he is currently retired.  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  He presents to the hospital for depression and suicidal ideations.  He has a primary diagnosis of Major Depressive Disorder, without psychotic features.  Recommendations include: crisis stabilization, therapeutic milieu, encourage group attendance and participation, medication management for detox/mood stabilization and development of comprehensive mental wellness/sobriety plan.  Rozann Lesches. 11/12/2020

## 2020-11-12 NOTE — Plan of Care (Signed)
  Problem: Education: Goal: Mental status will improve Outcome: Progressing Goal: Verbalization of understanding the information provided will improve Outcome: Progressing   Problem: Safety: Goal: Periods of time without injury will increase Outcome: Progressing

## 2020-11-12 NOTE — BHH Suicide Risk Assessment (Signed)
[place] INPATIENT:  Family/Significant Other Suicide Prevention Education  Suicide Prevention Education:  Patient Refusal for Family/Significant Other Suicide Prevention Education: The patient Jeremy Reese has refused to provide written consent for family/significant other to be provided Family/Significant Other Suicide Prevention Education during admission and/or prior to discharge.  Physician notified.  SPE completed with pt, as pt refused to consent to family contact. SPI pamphlet provided to pt and pt was encouraged to share information with support network, ask questions, and talk about any concerns relating to SPE. Pt denies access to guns/firearms and verbalized understanding of information provided. Mobile Crisis information also provided to pt.    Rozann Lesches 11/12/2020, 2:31 PM

## 2020-11-12 NOTE — Plan of Care (Signed)
D- Patient alert and oriented. Patient presents in a pleasant mood on assessment stating that he slept alright last night and had some complaints of lower back pain. Patient rated his pain a "3/10", in which he did request PRN medication from this Probation officer. Patient endorsed both depression and anxiety stating that his "open heart surgery" and "everyday life" is why he feels this way. Patient denies SI, HI, AVH, at this time. Patient's goal for today is "no depression", in which "meds and depression", in order to achieve his goal.  A- Scheduled medications administered to patient, per MD orders. Support and encouragement provided.  Routine safety checks conducted every 15 minutes.  Patient informed to notify staff with problems or concerns.  R- No adverse drug reactions noted. Patient contracts for safety at this time. Patient compliant with medications and treatment plan. Patient receptive, calm, and cooperative. Patient interacts well with others on the unit.  Patient remains safe at this time.  Problem: Education: Goal: Knowledge of [place] [facility] Education information/materials will improve Outcome: Progressing Goal: Emotional status will improve Outcome: Progressing Goal: Mental status will improve Outcome: Progressing Goal: Verbalization of understanding the information provided will improve Outcome: Progressing   Problem: Safety: Goal: Periods of time without injury will increase Outcome: Progressing   Problem: Education: Goal: Utilization of techniques to improve thought processes will improve Outcome: Progressing Goal: Knowledge of the prescribed therapeutic regimen will improve Outcome: Progressing   Problem: Safety: Goal: Ability to disclose and discuss suicidal ideas will improve Outcome: Progressing Goal: Ability to identify and utilize support systems that promote safety will improve Outcome: Progressing

## 2020-11-12 NOTE — Tx Team (Addendum)
Interdisciplinary Treatment and Diagnostic Plan Update  11/12/2020 Time of Session: 9:00AM Jeremy Reese MRN: [id number]  Principal Diagnosis: <principal problem not specified>  Secondary Diagnoses: Active Problems:   Severe recurrent major depression without psychotic features ([place])   Current Medications:  Current Facility-Administered Medications  Medication Dose Route Frequency Provider Last Rate Last Admin  . acetaminophen (TYLENOL) tablet 650 mg  650 mg Oral Q6H PRN [name], [name], MD   650 mg at 11/12/20 1194  . alum & mag hydroxide-simeth (MAALOX/MYLANTA) 200-200-20 MG/5ML suspension 30 mL  30 mL Oral Q4H PRN [name], John T, MD      . apixaban (ELIQUIS) tablet 5 mg  5 mg Oral BID [name], [name], MD   5 mg at 11/12/20 1740  . aspirin EC tablet 81 mg  81 mg Oral Daily [name], [name], MD   81 mg at 11/12/20 0820  . carvedilol (COREG) tablet 25 mg  25 mg Oral BID WC [name], John T, MD      . ezetimibe (ZETIA) tablet 10 mg  10 mg Oral Daily [name], [name], MD   10 mg at 11/12/20 8144  . FLUoxetine (PROZAC) capsule 20 mg  20 mg Oral Daily [name], [name], MD   20 mg at 11/12/20 0820  . [START ON 11/13/2020] furosemide (LASIX) tablet 40 mg  40 mg Oral QODAY [name], John T, MD      . gabapentin (NEURONTIN) capsule 400 mg  400 mg Oral TID [name], [name], MD   400 mg at 11/12/20 0820  . magnesium hydroxide (MILK OF MAGNESIA) suspension 30 mL  30 mL Oral Daily PRN [name], John T, MD      . melatonin tablet 5 mg  5 mg Oral QHS [name], [name], MD   5 mg at 11/11/20 2103  . nitroGLYCERIN (NITROSTAT) SL tablet 0.4 mg  0.4 mg Sublingual Q5 min PRN [name], John T, MD      . rosuvastatin (CRESTOR) tablet 40 mg  40 mg Oral Daily [name], [name], MD   40 mg at 11/12/20 8185  . temazepam (RESTORIL) capsule 15 mg  15 mg Oral QHS [name], [name], MD   15 mg at 11/11/20 2103  . traMADol (ULTRAM) tablet 50-100 mg  50-100 mg Oral BID PRN [name], [name], MD       PTA  Medications: Medications Prior to Admission  Medication Sig Dispense Refill Last Dose  . aspirin EC 81 MG tablet Take 1 tablet (81 mg total) by mouth daily. 90 tablet 3   . carvedilol (COREG) 25 MG tablet Take 1 tablet (25 mg total) by mouth 2 (two) times daily with a meal. 60 tablet 3   . ELIQUIS 5 MG TABS tablet TAKE ONE TABLET TWICE DAILY (Patient taking differently: Take 5 mg by mouth 2 (two) times daily.) 60 tablet 1   . ezetimibe (ZETIA) 10 MG tablet TAKE ONE (1) TABLET EACH DAY (Patient taking differently: Take 10 mg by mouth daily.) 30 tablet 3   . furosemide (LASIX) 40 MG tablet TAKE 1 TABLET BY MOUTH ONCE DAILY (Patient taking differently: Take 40 mg by mouth every other day.) 7 tablet 0   . gabapentin (NEURONTIN) 400 MG capsule Take 400 mg by mouth 3 (three) times daily.      . Melatonin 5 MG TABS Take 5 mg by mouth at bedtime.      . nitroGLYCERIN (NITROSTAT) 0.4 MG SL tablet Place 1 tablet (0.4 mg total) under the  tongue every 5 (five) minutes as needed for chest pain. 25 tablet 3   . potassium chloride (KLOR-CON) 10 MEQ tablet Take 1 tablet (10 mEq total) by mouth daily as needed. (Patient taking differently: Take 10 mEq by mouth daily as needed (low potassium).) 30 tablet 6   . rosuvastatin (CRESTOR) 40 MG tablet Take 1 tablet (40 mg total) by mouth daily. 90 tablet 3   . temazepam (RESTORIL) 15 MG capsule Take 15 mg by mouth at bedtime.     . traMADol (ULTRAM) 50 MG tablet Take 50-100 mg by mouth 2 (two) times daily as needed for moderate pain.       Patient Stressors: Health problems Medication change or noncompliance  Patient Strengths: Ability for insight Motivation for treatment/growth  Treatment Modalities: Medication Management, Group therapy, Case management,  1 to 1 session with clinician, Psychoeducation, Recreational therapy.   Physician Treatment Plan for Primary Diagnosis: <principal problem not specified> Long Term Goal(s):     Short Term Goals:     Medication Management: Evaluate patient's response, side effects, and tolerance of medication regimen.  Therapeutic Interventions: 1 to 1 sessions, Unit Group sessions and Medication administration.  Evaluation of Outcomes: Not Met  Physician Treatment Plan for Secondary Diagnosis: Active Problems:   Severe recurrent major depression without psychotic features ([place])  Long Term Goal(s):     Short Term Goals:       Medication Management: Evaluate patient's response, side effects, and tolerance of medication regimen.  Therapeutic Interventions: 1 to 1 sessions, Unit Group sessions and Medication administration.  Evaluation of Outcomes: Not Met   RN Treatment Plan for Primary Diagnosis: <principal problem not specified> Long Term Goal(s): Knowledge of disease and therapeutic regimen to maintain health will improve  Short Term Goals: Ability to demonstrate self-control, Ability to participate in decision making will improve, Ability to disclose and discuss suicidal ideas, Ability to identify and develop effective coping behaviors will improve and Compliance with prescribed medications will improve  Medication Management: RN will administer medications as ordered by provider, will assess and evaluate patient's response and provide education to patient for prescribed medication. RN will report any adverse and/or side effects to prescribing provider.  Therapeutic Interventions: 1 on 1 counseling sessions, Psychoeducation, Medication administration, Evaluate responses to treatment, Monitor vital signs and CBGs as ordered, Perform/monitor CIWA, COWS, AIMS and Fall Risk screenings as ordered, Perform wound care treatments as ordered.  Evaluation of Outcomes: Not Met   LCSW Treatment Plan for Primary Diagnosis: <principal problem not specified> Long Term Goal(s): Safe transition to appropriate next level of care at discharge, Engage patient in therapeutic group addressing interpersonal  concerns.  Short Term Goals: Engage patient in aftercare planning with referrals and resources, Increase social support, Increase ability to appropriately verbalize feelings, Increase emotional regulation and Facilitate acceptance of mental health diagnosis and concerns  Therapeutic Interventions: Assess for all discharge needs, 1 to 1 time with Social worker, Explore available resources and support systems, Assess for adequacy in community support network, Educate family and significant other(s) on suicide prevention, Complete Psychosocial Assessment, Interpersonal group therapy.  Evaluation of Outcomes: Not Met   Progress in Treatment: Attending groups: No. Participating in groups: No. Taking medication as prescribed: Yes. Toleration medication: Yes. Family/Significant other contact made: No, will contact:  once permission is given. Patient understands diagnosis: Yes. Discussing patient identified problems/goals with staff: Yes. Medical problems stabilized or resolved: Yes. Denies suicidal/homicidal ideation: Yes. Issues/concerns per patient self-inventory: No. Other: none  New problem(s) identified:  No, Describe:  none  New Short Term/Long Term Goal(s): medication management for mood stabilization; elimination of SI thoughts; development of comprehensive mental wellness plan.  Patient Goals:  "get over the depression and go home"  Discharge Plan or Barriers: CSW will assist patient in developing appropriate discharge plans.    Reason for Continuation of Hospitalization: Anxiety Depression Medication stabilization  Estimated Length of Stay:  1-7 days  Recreational Therapy: Patient Stressors: N/A Patient Goal: Patient will engage in groups without prompting or encouragement from LRT x3 group sessions within 5 recreation therapy group sessions.  Attendees: Patient:  Jeremy Reese 11/12/2020 9:42 AM  Physician: Dr. Domingo Cocking, MD 11/12/2020 9:42 AM  Nursing: Graceann Congress, RN 11/12/2020 9:42 AM  RN Care Manager: 11/12/2020 9:42 AM  Social Worker: Assunta Curtis, MSW, LCSW 11/12/2020 9:42 AM  Recreational Therapist: Roanna Epley, Reather Converse, LRT 11/12/2020 9:42 AM  Other: Paulla Dolly, MSW, [place], [place] 11/12/2020 9:42 AM  Other: Michell Heinrich, LCSW 11/12/2020 9:42 AM  Other: 11/12/2020 9:42 AM    Scribe for Treatment Team: Rozann Lesches, LCSW 11/12/2020 9:42 AM

## 2020-11-12 NOTE — H&P (Addendum)
Psychiatric Admission Assessment Adult  Patient Identification: Jeremy Reese MRN:  [id number] Date of Evaluation:  11/12/2020 Chief Complaint:  Severe recurrent major depression without psychotic features (Jeremy Reese) [F33.2] Principal Diagnosis: Severe recurrent major depression without psychotic features (Jeremy Reese) Diagnosis:  Principal Problem:   Severe recurrent major depression without psychotic features (Jeremy Reese) Active Problems:   Hyperlipidemia   Chronic atrial fibrillation (HCC)   S/P CABG x 3   Hypertension   Osteoarthritis   Spinal stenosis of lumbar region without neurogenic claudication  History of Present Illness:  69 year old male with history of depression who presented voluntarily to emergency room for worsening depression and suicidal thoughts. Patient assessed during treatment team and again one-on-one in office. He reports that in the past he struggled with seasonal affective disorder and depression. He had a suicide attempt via taking a few tablets to overdose, and was placed on Prozac and light therapy. He reports this successfully treated his depression for many years until he no longer needed therapy in the early 90s. He believes this most recent episode of depression was triggered by recent CABG in October. He was briefly placed on Lexapro which he felt caused shortness of breath, and ceased taking. For the last two weeks depression has gradually worsened until he began having suicidal thoughts. He notes poor sleep, low appetite, hopelessness, and helplessness. He was restarted on Prozac yesterday in the emergency room, and denies any side effects to this medication. He is hopeful that this will improve his symptoms once more.   Spoke with Jeremy Reese, patient's wife, at [phone]. She states that her husband had said he was feeling depressed and did not want to be alone because he was afraid he may do something stupid. She has gotten rid of all the guns in their household, and  locked up all his medications. She plans to be in charge of his medications at discharge so that he can not intentionally take to many. She feels he would benefit from seeing psychiatry and therapist again. She does relay he did very well in the past with Prozac and light box. Updated on current medications and plan of care. She had no other questions or concern.   Secure message sent to Jeremy Reese per patient request. Jeremy Reese is in agreement with stopping Lasix at this time. Will discontinue.   Associated Signs/Symptoms: Depression Symptoms:  depressed mood, anhedonia, hypersomnia, fatigue, hopelessness, recurrent thoughts of death, suicidal thoughts without plan, anxiety, disturbed sleep, Duration of Depression Symptoms: No data recorded (Hypo) Manic Symptoms:  Denies Anxiety Symptoms:  Denies Psychotic Symptoms:  Denies Duration of Psychotic Symptoms: No data recorded PTSD Symptoms: Negative Total Time spent with patient: 1 hour  Past Psychiatric History: Patient has a past history of depression.  In 1992 he had a suicide attempt by overdose.  Had a hospitalization at that time.  Saw Dr. Thurmond Reese for years.  Much of the time diagnosed with seasonal affective disorder and took fluoxetine and used a light box in the winter.  Between that time and now had not had any other major depressive episodes.  No history of mania.  Is the patient at risk to self? Yes.    Has the patient been a risk to self in the past 6 months? No.  Has the patient been a risk to self within the distant past? Yes.    Is the patient a risk to others? No.  Has the patient been a risk to others in the past 6  months? No.  Has the patient been a risk to others within the distant past? No.   Prior Inpatient Therapy:   Prior Outpatient Therapy:    Alcohol Screening: 1. How often do you have a drink containing alcohol?: Never 2. How many drinks containing alcohol do you have on a typical day when you are  drinking?: 1 or 2 3. How often do you have six or more drinks on one occasion?: Never AUDIT-C Score: 0 4. How often during the last year have you found that you were not able to stop drinking once you had started?: Never 5. How often during the last year have you failed to do what was normally expected from you because of drinking?: Never 6. How often during the last year have you needed a first drink in the morning to get yourself going after a heavy drinking session?: Never 7. How often during the last year have you had a feeling of guilt of remorse after drinking?: Never 8. How often during the last year have you been unable to remember what happened the night before because you had been drinking?: Never 9. Have you or someone else been injured as a result of your drinking?: No 10. Has a relative or friend or a doctor or another health worker been concerned about your drinking or suggested you cut down?: No Alcohol Use Disorder Identification Test Final Score (AUDIT): 0 Alcohol Brief Interventions/Follow-up: AUDIT Score <7 follow-up not indicated Substance Abuse History in the last 12 months:  No. Consequences of Substance Abuse: Negative Previous Psychotropic Medications: Yes  Psychological Evaluations: No  Past Medical History:  Past Medical History:  Diagnosis Date  . Atrial flutter, paroxysmal ([place])   . Carotid artery disease without cerebral infarction ([place])   . Coronary artery disease 2011  . Flatback syndrome of thoracolumbar region   . H/O calcium pyrophosphate deposition disease (CPPD)   . History of TIA (transient ischemic attack)   . Hyperlipidemia   . Paroxysmal A-fib ([place])   . Spinal stenosis of lumbar region without neurogenic claudication     Past Surgical History:  Procedure Laterality Date  . BILATERAL CARPAL TUNNEL RELEASE    . CARDIAC CATHETERIZATION    . CIRCUMCISION, NON-NEWBORN    . CLIPPING OF ATRIAL APPENDAGE N/A 09/18/2020   Procedure: CLIPPING OF  ATRIAL APPENDAGE USING 38 ATRICURE LAA  EXCLUSION SYSTEM;  Surgeon: Ivin Poot, MD;  Location: [facility];  Service: Open Heart Surgery;  Laterality: N/A;  . CORONARY ANGIOPLASTY WITH STENT PLACEMENT  2011  . CORONARY ARTERY BYPASS GRAFT N/A 09/18/2020   Procedure: CORONARY ARTERY BYPASS GRAFTING (CABG)X 3, ON PUMP, USING LEFT INTERAL MAMMARY ARTERY AND ENDOSCOPICALLY HARVESTED RIGHT GREATER SAPHENOUS VEIN. LIMA TO LAD, SVG TO OM, SVG TO PD;  Surgeon: Ivin Poot, MD;  Location: [facility];  Service: Open Heart Surgery;  Laterality: N/A;  . ENDOVEIN HARVEST OF GREATER SAPHENOUS VEIN Right 09/18/2020   Procedure: ENDOVEIN HARVEST OF GREATER SAPHENOUS VEIN;  Surgeon: Ivin Poot, MD;  Location: [facility];  Service: Open Heart Surgery;  Laterality: Right;  . LEFT HEART CATH AND CORONARY ANGIOGRAPHY Left 09/12/2020   Procedure: LEFT HEART CATH AND CORONARY ANGIOGRAPHY;  Surgeon: Minna Merritts, MD;  Location: [facility];  Service: Cardiovascular;  Laterality: Left;  . TEE WITHOUT CARDIOVERSION N/A 09/18/2020   Procedure: TRANSESOPHAGEAL ECHOCARDIOGRAM (TEE);  Surgeon: Prescott Gum, Collier Salina, MD;  Location: [facility];  Service: Open Heart Surgery;  Laterality: N/A;  Family History:  Family History  Problem Relation Age of Onset  . Heart disease Mother 86       CABG x 3   . Hypertension Father    Family Psychiatric  History: Denies Tobacco Screening: Have you used any form of tobacco in the last 30 days? (Cigarettes, Smokeless Tobacco, Cigars, and/or Pipes): Yes Tobacco use, Select all that apply: 4 or less cigarettes per day Are you interested in Tobacco Cessation Medications?: No, patient refused Counseled patient on smoking cessation including recognizing danger situations, developing coping skills and basic information about quitting provided: Refused/Declined practical counseling Social History:  Social History   Substance and Sexual Activity  Alcohol Use Yes   Comment: rare       Social History   Substance and Sexual Activity  Drug Use No    Additional Social History:                           Allergies:   Allergies  Allergen Reactions  . Propafenone Anxiety  . Codeine Nausea Only  . Penicillins Rash   Lab Results:  Results for orders placed or performed during the hospital encounter of 11/10/20 (from the past 48 hour(s))  Comprehensive metabolic panel     Status: Abnormal   Collection Time: 11/10/20  5:55 PM  Result Value Ref Range   Sodium 135 135 - 145 mmol/L   Potassium 4.3 3.5 - 5.1 mmol/L   Chloride 100 98 - 111 mmol/L   CO2 25 22 - 32 mmol/L   Glucose, Bld 115 (H) 70 - 99 mg/dL    Comment: Glucose reference range applies only to samples taken after fasting for at least 8 hours.   BUN 17 8 - 23 mg/dL   Creatinine, Ser 1.08 0.61 - 1.24 mg/dL   Calcium 9.6 8.9 - 10.3 mg/dL   Total Protein 7.3 6.5 - 8.1 g/dL   Albumin 4.2 3.5 - 5.0 g/dL   AST 33 15 - 41 U/L   ALT 42 0 - 44 U/L   Alkaline Phosphatase 65 38 - 126 U/L   Total Bilirubin 1.2 0.3 - 1.2 mg/dL   GFR, Estimated >60 >60 mL/min    Comment: (NOTE) Calculated using the CKD-EPI Creatinine Equation (2021)    Anion gap 10 5 - 15    Comment: Performed at [facility], [address] [place], [place] [address]  Ethanol     Status: None   Collection Time: 11/10/20  5:55 PM  Result Value Ref Range   Alcohol, Ethyl (B) <10 <10 mg/dL    Comment: (NOTE) Lowest detectable limit for serum alcohol is 10 mg/dL.  For medical purposes only. Performed at [facility], [address] [place], [place] [address]   Salicylate level     Status: Abnormal   Collection Time: 11/10/20  5:55 PM  Result Value Ref Range   Salicylate Lvl <4.0 (L) 7.0 - 30.0 mg/dL    Comment: Performed at [facility], [address] [place], [place] [address]  Acetaminophen level     Status: Abnormal   Collection Time: 11/10/20  5:55 PM  Result Value Ref Range   Acetaminophen  (Tylenol), Serum <10 (L) 10 - 30 ug/mL    Comment: (NOTE) Therapeutic concentrations vary significantly. A range of 10-30 ug/mL  may be an effective concentration for many patients. However, some  are best treated at concentrations outside of this range. Acetaminophen concentrations >150 ug/mL  at 4 hours after ingestion  and >50 ug/mL at 12 hours after ingestion are often associated with  toxic reactions.  Performed at [facility], [address] [place], [place] [address]   cbc     Status: None   Collection Time: 11/10/20  5:55 PM  Result Value Ref Range   WBC 8.9 4.0 - 10.5 K/uL   RBC 4.49 4.22 - 5.81 MIL/uL   Hemoglobin 14.3 13.0 - 17.0 g/dL   HCT 43.5 39.0 - 52.0 %   MCV 96.9 80.0 - 100.0 fL   MCH 31.8 26.0 - 34.0 pg   MCHC 32.9 30.0 - 36.0 g/dL   RDW 12.9 11.5 - 15.5 %   Platelets 196 150 - 400 K/uL   nRBC 0.0 0.0 - 0.2 %    Comment: Performed at [facility], [address] [place], [place] [address]  Urine Drug Screen, Qualitative     Status: Abnormal   Collection Time: 11/10/20  5:55 PM  Result Value Ref Range   Tricyclic, Ur Screen NONE DETECTED NONE DETECTED   Amphetamines, Ur Screen NONE DETECTED NONE DETECTED   MDMA (Ecstasy)Ur Screen NONE DETECTED NONE DETECTED   Cocaine Metabolite,Ur [place] NONE DETECTED NONE DETECTED   Opiate, Ur Screen NONE DETECTED NONE DETECTED   Phencyclidine (PCP) Ur S NONE DETECTED NONE DETECTED   Cannabinoid 50 Ng, Ur [place] NONE DETECTED NONE DETECTED   Barbiturates, Ur Screen NONE DETECTED NONE DETECTED   Benzodiazepine, Ur Scrn POSITIVE (A) NONE DETECTED   Methadone Scn, Ur NONE DETECTED NONE DETECTED    Comment: (NOTE) Tricyclics + metabolites, urine    Cutoff 1000 ng/mL Amphetamines + metabolites, urine  Cutoff 1000 ng/mL MDMA (Ecstasy), urine              Cutoff 500 ng/mL Cocaine Metabolite, urine          Cutoff 300 ng/mL Opiate + metabolites, urine        Cutoff 300 ng/mL Phencyclidine (PCP), urine          Cutoff 25 ng/mL Cannabinoid, urine                 Cutoff 50 ng/mL Barbiturates + metabolites, urine  Cutoff 200 ng/mL Benzodiazepine, urine              Cutoff 200 ng/mL Methadone, urine                   Cutoff 300 ng/mL  The urine drug screen provides only a preliminary, unconfirmed analytical test result and should not be used for non-medical purposes. Clinical consideration and professional judgment should be applied to any positive drug screen result due to possible interfering substances. A more specific alternate chemical method must be used in order to obtain a confirmed analytical result. Gas chromatography / mass spectrometry (GC/MS) is the preferred confirm atory method. Performed at [facility], [address] [place], [place] [address]   Resp Panel by RT-PCR (Flu A&B, Covid) Nasopharyngeal Swab     Status: None   Collection Time: 11/10/20  6:27 PM   Specimen: Nasopharyngeal Swab; Nasopharyngeal(NP) swabs in vial transport medium  Result Value Ref Range   SARS Coronavirus 2 by RT PCR NEGATIVE NEGATIVE    Comment: (NOTE) SARS-CoV-2 target nucleic acids are NOT DETECTED.  The SARS-CoV-2 RNA is generally detectable in upper respiratory specimens during the acute phase of infection. The lowest concentration of SARS-CoV-2 viral copies this assay can detect is 138 copies/mL.  A negative result does not preclude SARS-Cov-2 infection and should not be used as the sole basis for treatment or other patient management decisions. A negative result may occur with  improper specimen collection/handling, submission of specimen other than nasopharyngeal swab, presence of viral mutation(s) within the areas targeted by this assay, and inadequate number of viral copies(<138 copies/mL). A negative result must be combined with clinical observations, patient history, and epidemiological information. The expected result is Negative.  Fact Sheet for Patients:   EntrepreneurPulse.com.au  Fact Sheet for Healthcare Providers:  IncredibleEmployment.be  This test is no t yet approved or cleared by the [place] FDA and  has been authorized for detection and/or diagnosis of SARS-CoV-2 by FDA under an Emergency Use Authorization (EUA). This EUA will remain  in effect (meaning this test can be used) for the duration of the COVID-19 declaration under Section 564(b)(1) of the Act, 21 U.S.C.section 360bbb-3(b)(1), unless the authorization is terminated  or revoked sooner.       Influenza A by PCR NEGATIVE NEGATIVE   Influenza B by PCR NEGATIVE NEGATIVE    Comment: (NOTE) The Xpert Xpress SARS-CoV-2/FLU/RSV plus assay is intended as an aid in the diagnosis of influenza from Nasopharyngeal swab specimens and should not be used as a sole basis for treatment. Nasal washings and aspirates are unacceptable for Xpert Xpress SARS-CoV-2/FLU/RSV testing.  Fact Sheet for Patients: EntrepreneurPulse.com.au  Fact Sheet for Healthcare Providers: IncredibleEmployment.be  This test is not yet approved or cleared by the [place] FDA and has been authorized for detection and/or diagnosis of SARS-CoV-2 by FDA under an Emergency Use Authorization (EUA). This EUA will remain in effect (meaning this test can be used) for the duration of the COVID-19 declaration under Section 564(b)(1) of the Act, 21 U.S.C. section 360bbb-3(b)(1), unless the authorization is terminated or revoked.  Performed at [facility], [address] [place], [place] [address]   Urinalysis, Complete w Microscopic     Status: Abnormal   Collection Time: 11/11/20  1:14 PM  Result Value Ref Range   Color, Urine YELLOW (A) YELLOW   APPearance CLEAR (A) CLEAR   Specific Gravity, Urine 1.018 1.005 - 1.030   pH 6.0 5.0 - 8.0   Glucose, UA NEGATIVE NEGATIVE mg/dL   Hgb urine dipstick NEGATIVE NEGATIVE    Bilirubin Urine NEGATIVE NEGATIVE   Ketones, ur NEGATIVE NEGATIVE mg/dL   Protein, ur 30 (A) NEGATIVE mg/dL   Nitrite NEGATIVE NEGATIVE   Leukocytes,Ua NEGATIVE NEGATIVE   RBC / HPF 0-5 0 - 5 RBC/hpf   WBC, UA 0-5 0 - 5 WBC/hpf   Bacteria, UA NONE SEEN NONE SEEN   Squamous Epithelial / LPF NONE SEEN 0 - 5   Mucus PRESENT     Comment: Performed at [facility], [address] [place], [place] [address]    Blood Alcohol level:  Lab Results  Component Value Date   [facility] <10 30/86/5784    Metabolic Disorder Labs:  Lab Results  Component Value Date   HGBA1C 6.4 (H) 09/16/2020   MPG 137 09/16/2020   No results found for: PROLACTIN Lab Results  Component Value Date   CHOL 108 09/05/2020   TRIG 76 09/05/2020   HDL 44 09/05/2020   CHOLHDL 2.5 09/05/2020   LDLCALC 48 09/05/2020   LDLCALC 95 04/02/2015    Current Medications: Current Facility-Administered Medications  Medication Dose Route Frequency Provider Last Rate Last Admin  . acetaminophen (TYLENOL) tablet 650 mg  650 mg Oral  Q6H PRN [name], [name], MD   650 mg at 11/12/20 3154  . alum & mag hydroxide-simeth (MAALOX/MYLANTA) 200-200-20 MG/5ML suspension 30 mL  30 mL Oral Q4H PRN [name], John T, MD      . apixaban (ELIQUIS) tablet 5 mg  5 mg Oral BID [name], [name], MD   5 mg at 11/12/20 0086  . aspirin EC tablet 81 mg  81 mg Oral Daily [name], [name], MD   81 mg at 11/12/20 0820  . carvedilol (COREG) tablet 25 mg  25 mg Oral BID WC [name], John T, MD      . ezetimibe (ZETIA) tablet 10 mg  10 mg Oral Daily [name], [name], MD   10 mg at 11/12/20 7619  . [START ON 11/13/2020] FLUoxetine (PROZAC) capsule 40 mg  40 mg Oral Daily [name], MD      . Derrill Memo ON 11/13/2020] furosemide (LASIX) tablet 40 mg  40 mg Oral QODAY [name], John T, MD      . gabapentin (NEURONTIN) capsule 400 mg  400 mg Oral TID [name], [name], MD   400 mg at 11/12/20 1134  . magnesium hydroxide (MILK OF MAGNESIA) suspension 30 mL   30 mL Oral Daily PRN [name], John T, MD      . melatonin tablet 5 mg  5 mg Oral QHS [name], [name], MD   5 mg at 11/11/20 2103  . nitroGLYCERIN (NITROSTAT) SL tablet 0.4 mg  0.4 mg Sublingual Q5 min PRN [name], John T, MD      . phenylephrine-shark liver oil-mineral oil-petrolatum (PREPARATION H) rectal ointment 1 application  1 application Rectal BID PRN [name], MD   1 application at 50/93/26 1134  . rosuvastatin (CRESTOR) tablet 40 mg  40 mg Oral Daily [name], [name], MD   40 mg at 11/12/20 7124  . temazepam (RESTORIL) capsule 15 mg  15 mg Oral QHS [name], [name], MD   15 mg at 11/11/20 2103  . traMADol (ULTRAM) tablet 50-100 mg  50-100 mg Oral BID PRN [name], [name], MD       PTA Medications: Medications Prior to Admission  Medication Sig Dispense Refill Last Dose  . aspirin EC 81 MG tablet Take 1 tablet (81 mg total) by mouth daily. 90 tablet 3   . carvedilol (COREG) 25 MG tablet Take 1 tablet (25 mg total) by mouth 2 (two) times daily with a meal. 60 tablet 3   . ELIQUIS 5 MG TABS tablet TAKE ONE TABLET TWICE DAILY (Patient taking differently: Take 5 mg by mouth 2 (two) times daily.) 60 tablet 1   . ezetimibe (ZETIA) 10 MG tablet TAKE ONE (1) TABLET EACH DAY (Patient taking differently: Take 10 mg by mouth daily.) 30 tablet 3   . furosemide (LASIX) 40 MG tablet TAKE 1 TABLET BY MOUTH ONCE DAILY (Patient taking differently: Take 40 mg by mouth every other day.) 7 tablet 0   . gabapentin (NEURONTIN) 400 MG capsule Take 400 mg by mouth 3 (three) times daily.      . Melatonin 5 MG TABS Take 5 mg by mouth at bedtime.      . nitroGLYCERIN (NITROSTAT) 0.4 MG SL tablet Place 1 tablet (0.4 mg total) under the tongue every 5 (five) minutes as needed for chest pain. 25 tablet 3   . potassium chloride (KLOR-CON) 10 MEQ tablet Take 1 tablet (10 mEq total) by mouth daily as needed. (Patient taking differently: Take 10 mEq  by mouth daily as needed (low potassium).) 30 tablet 6   .  rosuvastatin (CRESTOR) 40 MG tablet Take 1 tablet (40 mg total) by mouth daily. 90 tablet 3   . temazepam (RESTORIL) 15 MG capsule Take 15 mg by mouth at bedtime.     . traMADol (ULTRAM) 50 MG tablet Take 50-100 mg by mouth 2 (two) times daily as needed for moderate pain.       Musculoskeletal: Strength & Muscle Tone: within normal limits Gait & Station: ambulates with walker Patient leans: Front  Psychiatric Specialty Exam: Physical Exam Vitals and nursing note reviewed.  Constitutional:      Appearance: Normal appearance.  HENT:     Head: Normocephalic and atraumatic.     Right Ear: External ear normal.     Left Ear: External ear normal.     Nose: Nose normal.     Mouth/Throat:     Mouth: Mucous membranes are moist.     Pharynx: Oropharynx is clear.  Eyes:     Extraocular Movements: Extraocular movements intact.     Conjunctiva/sclera: Conjunctivae normal.     Pupils: Pupils are equal, round, and reactive to light.  Cardiovascular:     Rate and Rhythm: Normal rate.     Pulses: Normal pulses.  Pulmonary:     Effort: Pulmonary effort is normal.     Breath sounds: Normal breath sounds.  Abdominal:     General: Abdomen is flat.     Palpations: Abdomen is soft.  Musculoskeletal:     Cervical back: Normal range of motion and neck supple.     Comments: Patient ambulates with walker, back is hunched forward  Skin:    General: Skin is warm and dry.  Neurological:     General: No focal deficit present.     Mental Status: He is alert and oriented to person, place, and time.  Psychiatric:        Attention and Perception: Attention and perception normal.        Mood and Affect: Mood is depressed.        Speech: Speech normal.        Behavior: Behavior is cooperative.        Thought Content: Thought content is not paranoid or delusional. Thought content includes suicidal ideation. Thought content does not include homicidal ideation. Thought content does not include suicidal plan.         Cognition and Memory: Cognition and memory normal.        Judgment: Judgment normal.     Review of Systems  Constitutional: Positive for activity change and fatigue.  HENT: Negative for rhinorrhea and sore throat.   Eyes: Positive for photophobia. Negative for visual disturbance.  Respiratory: Negative for cough and shortness of breath.   Cardiovascular: Negative for chest pain and palpitations.  Gastrointestinal: Negative for constipation, diarrhea, nausea and vomiting.  Endocrine: Negative for cold intolerance and heat intolerance.  Genitourinary: Positive for frequency. Negative for dysuria.  Musculoskeletal: Negative for arthralgias and joint swelling.  Skin: Negative for rash and wound.  Allergic/Immunologic: Negative for food allergies and immunocompromised state.  Neurological: Negative for dizziness and headaches.  Hematological: Negative for adenopathy. Does not bruise/bleed easily.  Psychiatric/Behavioral: Positive for dysphoric mood and suicidal ideas.    Blood pressure 112/72, pulse 70, temperature 97.6 F (36.4 C), temperature source Oral, resp. rate 17, height 5\' 10"  (1.778 m), weight 104.3 kg, SpO2 100 %.Body mass index is 32.99 kg/m.  General Appearance: Well Groomed  Eye Contact:  Good  Speech:  Clear and Coherent  Volume:  Normal  Mood:  Depressed and Dysphoric  Affect:  Congruent and Constricted  Thought Process:  Coherent and Linear  Orientation:  Full (Time, Place, and Person)  Thought Content:  Logical  Suicidal Thoughts:  Yes.  without intent/plan  Homicidal Thoughts:  No  Memory:  Immediate;   Fair Recent;   Fair Remote;   Fair  Judgement:  Good  Insight:  Good  Psychomotor Activity:  Normal  Concentration:  Concentration: Fair and Attention Span: Fair  Recall:  [employer] of Knowledge:  Fair  Language:  Fair  Akathisia:  Negative  Handed:  Right  AIMS (if indicated):     Assets:  Communication Skills Desire for Improvement Financial  Resources/Insurance Housing Intimacy Leisure Time Resilience Social Support  ADL's:  Intact  Cognition:  WNL  Sleep:  Number of Hours: 8.25       Treatment Plan Summary: Daily contact with patient to assess and evaluate symptoms and progress in treatment and Medication management PLAN OF CARE: Patient is a 69 year old male who presents voluntarily for worsening mood and suicidal ideation without plan or intent. Mood gradually worsening over last two weeks. Will increase Prozac to 40 mg daily. Continue home medications for chronic medical issues.   Observation Level/Precautions:  15 minute checks  Laboratory:  Completed in ED  Psychotherapy:    Medications:    Consultations:    Discharge Concerns:    Estimated LOS:  Other:     Physician Treatment Plan for Primary Diagnosis: Severe recurrent major depression without psychotic features ([place]) Long Term Goal(s): Improvement in symptoms so as ready for discharge  Short Term Goals: Ability to identify changes in lifestyle to reduce recurrence of condition will improve, Ability to verbalize feelings will improve, Ability to disclose and discuss suicidal ideas, Ability to demonstrate self-control will improve and Ability to identify and develop effective coping behaviors will improve  Physician Treatment Plan for Secondary Diagnosis: Principal Problem:   Severe recurrent major depression without psychotic features ([place]) Active Problems:   Hyperlipidemia   Chronic atrial fibrillation (HCC)   S/P CABG x 3   Hypertension   Osteoarthritis   Spinal stenosis of lumbar region without neurogenic claudication  Long Term Goal(s): Improvement in symptoms so as ready for discharge  Short Term Goals: Ability to identify changes in lifestyle to reduce recurrence of condition will improve, Ability to verbalize feelings will improve, Ability to disclose and discuss suicidal ideas, Ability to demonstrate self-control will improve and Ability to  identify and develop effective coping behaviors will improve  I certify that inpatient services furnished can reasonably be expected to improve the patient's condition.    [name], MD 12/15/202112:05 PM

## 2020-11-13 MED ORDER — HYDROXYZINE HCL 25 MG PO TABS
25.0000 mg | ORAL_TABLET | Freq: Once | ORAL | Status: AC
  Administered 2020-11-13: 25 mg via ORAL
  Filled 2020-11-13: qty 1

## 2020-11-13 MED ORDER — HYDROXYZINE HCL 25 MG PO TABS
25.0000 mg | ORAL_TABLET | Freq: Three times a day (TID) | ORAL | Status: DC | PRN
  Administered 2020-11-13: 25 mg via ORAL
  Filled 2020-11-13: qty 1

## 2020-11-13 MED ORDER — HYDROXYZINE HCL 50 MG PO TABS
50.0000 mg | ORAL_TABLET | Freq: Three times a day (TID) | ORAL | Status: DC | PRN
  Administered 2020-11-14: 50 mg via ORAL
  Filled 2020-11-13: qty 1

## 2020-11-13 NOTE — Progress Notes (Signed)
Patient presents with sad, flat affect wanting to take sleep meds early. Pt states has a hard time sleeping at night. Pt states he would like to be discharged tomorrow if possible. Denies any SI, HI AVH. States as long as he takes his meds he is not suicidal. Reports the surgery caused the depression.  Encouragement and support provided. Safety checks maintained. Pt receptive and remains safe on unit with q 15 min checks. Pt currently in bed resting eyes closed.

## 2020-11-13 NOTE — Plan of Care (Signed)
Patient rated his anxiety and depression 3/10 this morning.Patient stated that he is not able to relax around the peers and that riggers his anxiety. Vistaril given.Chaplin consulted.Patient stated that his anxiety is little better now.Denies SI,HI and AVH.Visible in the milieu.Appropriate with staff & peers.Ambulates with walker.Attended groups.Compliant with medications.Support and encouragement given.

## 2020-11-13 NOTE — Plan of Care (Signed)
  Problem: Education: Goal: Emotional status will improve Outcome: Progressing Goal: Mental status will improve Outcome: Progressing Goal: Verbalization of understanding the information provided will improve Outcome: Progressing   Problem: Safety: Goal: Periods of time without injury will increase Outcome: Progressing   Problem: Education: Goal: Knowledge of the prescribed therapeutic regimen will improve Outcome: Progressing   Problem: Safety: Goal: Ability to disclose and discuss suicidal ideas will improve Outcome: Progressing

## 2020-11-13 NOTE — Progress Notes (Signed)
Jeremy Reese visited pt. after receiving PG for prayer support.  When Jeremy Reese entered rm., pt. lying on bed resting, having just left group session because one of the other participants was behaving in a way that pt. found difficult to tolerate.  Pt. shared he has been feeling depressed ever since having open ehart surgery earlier this Fall; he says that this is a relatively common occurrence among patients undergoing this procedure, citing increased adrenaline as a reason for his poor sleep.  Pt. lives with his [name]; they have numerous animals in their care.  Pt. shared that his daughter will be visiting for Christmas this coming week and that he is looking forward to this. Pt. is concerned that 'I might hurt myself' if he goes home, but also verbalized an urgent need to 'get out of this place', citing difficulty dealing with other pateints.  Pt. requested prayer.  CH remains available as needed.

## 2020-11-13 NOTE — Progress Notes (Signed)
[facility] MD Progress Note  11/13/2020 9:50 AM Jeremy Reese  MRN:  [id number] Subjective:  Patient is a 69 year old male who presents voluntarily for worsening mood and suicidal ideation without plan or intent. No acute overnight events, medication compliant.   Patient seen one-on-one in office this morning. He reports anxiety today due to noise of the unit, and acuity of other patients. He reports that he continues to have some depression as well, but feels it is more manageable. He is hopeful to discharge soon, but feels it would be best to wait until tomorrow when his wife is not working so that he does not have to be alone all day. Processed through some of his emotions today. Encouraged him to attend group sessions today for additional coping skills and group therapy. Patient agreeable with this plan. Will also provide Vistaril PRN for anxiety.   Principal Problem: Severe recurrent major depression without psychotic features ([place]) Diagnosis: Principal Problem:   Severe recurrent major depression without psychotic features ([place]) Active Problems:   Hyperlipidemia   Chronic atrial fibrillation (HCC)   S/P CABG x 3   Hypertension   Osteoarthritis   Spinal stenosis of lumbar region without neurogenic claudication  Total Time spent with patient: 30 minutes  Past Psychiatric History: Patient has a past history of depression. In 1992 he had a suicide attempt by overdose. Had a hospitalization at that time. Saw Dr. Thurmond Butts for years. Much of the time diagnosed with seasonal affective disorder and took fluoxetine and used a light box in the winter. Between that time and now had not had any other major depressive episodes. No history of mania.  Past Medical History:  Past Medical History:  Diagnosis Date  . Atrial flutter, paroxysmal ([place])   . Carotid artery disease without cerebral infarction ([place])   . Coronary artery disease 2011  . Flatback syndrome of thoracolumbar region   . H/O  calcium pyrophosphate deposition disease (CPPD)   . History of TIA (transient ischemic attack)   . Hyperlipidemia   . Paroxysmal A-fib ([place])   . Spinal stenosis of lumbar region without neurogenic claudication     Past Surgical History:  Procedure Laterality Date  . BILATERAL CARPAL TUNNEL RELEASE    . CARDIAC CATHETERIZATION    . CIRCUMCISION, NON-NEWBORN    . CLIPPING OF ATRIAL APPENDAGE N/A 09/18/2020   Procedure: CLIPPING OF ATRIAL APPENDAGE USING 54 ATRICURE LAA  EXCLUSION SYSTEM;  Surgeon: Ivin Poot, MD;  Location: [facility];  Service: Open Heart Surgery;  Laterality: N/A;  . CORONARY ANGIOPLASTY WITH STENT PLACEMENT  2011  . CORONARY ARTERY BYPASS GRAFT N/A 09/18/2020   Procedure: CORONARY ARTERY BYPASS GRAFTING (CABG)X 3, ON PUMP, USING LEFT INTERAL MAMMARY ARTERY AND ENDOSCOPICALLY HARVESTED RIGHT GREATER SAPHENOUS VEIN. LIMA TO LAD, SVG TO OM, SVG TO PD;  Surgeon: Ivin Poot, MD;  Location: [facility];  Service: Open Heart Surgery;  Laterality: N/A;  . ENDOVEIN HARVEST OF GREATER SAPHENOUS VEIN Right 09/18/2020   Procedure: ENDOVEIN HARVEST OF GREATER SAPHENOUS VEIN;  Surgeon: Ivin Poot, MD;  Location: [facility];  Service: Open Heart Surgery;  Laterality: Right;  . LEFT HEART CATH AND CORONARY ANGIOGRAPHY Left 09/12/2020   Procedure: LEFT HEART CATH AND CORONARY ANGIOGRAPHY;  Surgeon: Minna Merritts, MD;  Location: [facility];  Service: Cardiovascular;  Laterality: Left;  . TEE WITHOUT CARDIOVERSION N/A 09/18/2020   Procedure: TRANSESOPHAGEAL ECHOCARDIOGRAM (TEE);  Surgeon: Prescott Gum, Collier Salina, MD;  Location: [facility];  Service:  Open Heart Surgery;  Laterality: N/A;   Family History:  Family History  Problem Relation Age of Onset  . Heart disease Mother 59       CABG x 3   . Hypertension Father    Family Psychiatric  History: Denies Social History:  Social History   Substance and Sexual Activity  Alcohol Use Yes   Comment: rare      Social History    Substance and Sexual Activity  Drug Use No    Social History   Socioeconomic History  . Marital status: Married    Spouse name: Not on file  . Number of children: Not on file  . Years of education: Not on file  . Highest education level: Not on file  Occupational History  . Not on file  Tobacco Use  . Smoking status: Former Smoker    Packs/day: 0.25    Years: 40.00    Pack years: 10.00    Types: Cigarettes  . Smokeless tobacco: Never Used  Vaping Use  . Vaping Use: Never used  Substance and Sexual Activity  . Alcohol use: Yes    Comment: rare   . Drug use: No  . Sexual activity: Not on file  Other Topics Concern  . Not on file  Social History Narrative  . Not on file   Social Determinants of Health   Financial Resource Strain: Not on file  Food Insecurity: Not on file  Transportation Needs: Not on file  Physical Activity: Not on file  Stress: Not on file  Social Connections: Not on file   Additional Social History:                         Sleep: Poor  Appetite:  Fair  Current Medications: Current Facility-Administered Medications  Medication Dose Route Frequency Provider Last Rate Last Admin  . acetaminophen (TYLENOL) tablet 650 mg  650 mg Oral Q6H PRN [name], [name], MD   650 mg at 11/12/20 4696  . alum & mag hydroxide-simeth (MAALOX/MYLANTA) 200-200-20 MG/5ML suspension 30 mL  30 mL Oral Q4H PRN [name], John T, MD      . apixaban (ELIQUIS) tablet 5 mg  5 mg Oral BID [name], [name], MD   5 mg at 11/13/20 0814  . aspirin EC tablet 81 mg  81 mg Oral Daily [name], [name], MD   81 mg at 11/13/20 0814  . carvedilol (COREG) tablet 25 mg  25 mg Oral BID WC [name], [name], MD   25 mg at 11/13/20 0814  . ezetimibe (ZETIA) tablet 10 mg  10 mg Oral Daily [name], [name], MD   10 mg at 11/13/20 0814  . FLUoxetine (PROZAC) capsule 40 mg  40 mg Oral QHS [name], MD      . gabapentin (NEURONTIN) capsule 400 mg  400 mg Oral TID [name], [name],  MD   400 mg at 11/13/20 0814  . hydrOXYzine (ATARAX/VISTARIL) tablet 25 mg  25 mg Oral TID PRN [name], MD   25 mg at 11/13/20 0853  . magnesium hydroxide (MILK OF MAGNESIA) suspension 30 mL  30 mL Oral Daily PRN [name], John T, MD      . melatonin tablet 5 mg  5 mg Oral QHS [name], [name], MD   5 mg at 11/12/20 2103  . nitroGLYCERIN (NITROSTAT) SL tablet 0.4 mg  0.4 mg Sublingual Q5 min PRN [name], [name], MD      .  phenylephrine-shark liver oil-mineral oil-petrolatum (PREPARATION H) rectal ointment 1 application  1 application Rectal BID PRN [name], MD   1 application at 86/57/84 1134  . rosuvastatin (CRESTOR) tablet 40 mg  40 mg Oral Daily [name], [name], MD   40 mg at 11/13/20 0814  . temazepam (RESTORIL) capsule 15 mg  15 mg Oral QHS [name], [name], MD   15 mg at 11/12/20 2103  . traMADol (ULTRAM) tablet 50-100 mg  50-100 mg Oral BID PRN [name], [name], MD   50 mg at 11/12/20 1813    Lab Results:  Results for orders placed or performed during the hospital encounter of 11/10/20 (from the past 48 hour(s))  Urinalysis, Complete w Microscopic     Status: Abnormal   Collection Time: 11/11/20  1:14 PM  Result Value Ref Range   Color, Urine YELLOW (A) YELLOW   APPearance CLEAR (A) CLEAR   Specific Gravity, Urine 1.018 1.005 - 1.030   pH 6.0 5.0 - 8.0   Glucose, UA NEGATIVE NEGATIVE mg/dL   Hgb urine dipstick NEGATIVE NEGATIVE   Bilirubin Urine NEGATIVE NEGATIVE   Ketones, ur NEGATIVE NEGATIVE mg/dL   Protein, ur 30 (A) NEGATIVE mg/dL   Nitrite NEGATIVE NEGATIVE   Leukocytes,Ua NEGATIVE NEGATIVE   RBC / HPF 0-5 0 - 5 RBC/hpf   WBC, UA 0-5 0 - 5 WBC/hpf   Bacteria, UA NONE SEEN NONE SEEN   Squamous Epithelial / LPF NONE SEEN 0 - 5   Mucus PRESENT     Comment: Performed at [facility], [address] [place], [place] [address]    Blood Alcohol level:  Lab Results  Component Value Date   [facility] <10 52/84/1324    Metabolic Disorder Labs: Lab  Results  Component Value Date   HGBA1C 6.4 (H) 09/16/2020   MPG 137 09/16/2020   No results found for: PROLACTIN Lab Results  Component Value Date   CHOL 108 09/05/2020   TRIG 76 09/05/2020   HDL 44 09/05/2020   CHOLHDL 2.5 09/05/2020   LDLCALC 48 09/05/2020   LDLCALC 95 04/02/2015    Physical Findings: AIMS:  , ,  ,  ,    CIWA:    COWS:     Musculoskeletal: Strength & Muscle Tone: within normal limits Gait & Station: ambulates with walker Patient leans: Front  Psychiatric Specialty Exam: Physical Exam Vitals and nursing note reviewed.  Constitutional:      Appearance: Normal appearance.  HENT:     Head: Normocephalic and atraumatic.     Right Ear: External ear normal.     Left Ear: External ear normal.     Nose: Nose normal.     Mouth/Throat:     Mouth: Mucous membranes are moist.     Pharynx: Oropharynx is clear.  Eyes:     Extraocular Movements: Extraocular movements intact.     Conjunctiva/sclera: Conjunctivae normal.     Pupils: Pupils are equal, round, and reactive to light.  Cardiovascular:     Rate and Rhythm: Tachycardia present.  Pulmonary:     Effort: Pulmonary effort is normal.     Breath sounds: Normal breath sounds.  Abdominal:     General: Abdomen is flat.     Palpations: Abdomen is soft.  Musculoskeletal:        General: No swelling. Normal range of motion.     Cervical back: Normal range of motion and neck supple.  Skin:    General: Skin is warm  and dry.  Neurological:     General: No focal deficit present.     Mental Status: He is alert and oriented to person, place, and time.  Psychiatric:        Attention and Perception: Attention and perception normal.        Mood and Affect: Mood is depressed.        Speech: Speech normal.        Behavior: Behavior is withdrawn.        Thought Content: Thought content does not include suicidal ideation. Thought content does not include suicidal plan.        Cognition and Memory: Cognition and  memory normal.        Judgment: Judgment normal.     Review of Systems  Constitutional: Positive for fatigue. Negative for appetite change.  HENT: Negative for rhinorrhea and sore throat.   Eyes: Negative for photophobia.  Respiratory: Negative for cough and shortness of breath.   Cardiovascular: Negative for chest pain and palpitations.  Gastrointestinal: Negative for constipation, diarrhea, nausea and vomiting.  Endocrine: Negative for cold intolerance and heat intolerance.  Genitourinary: Negative for difficulty urinating and dysuria.  Musculoskeletal: Negative for arthralgias and myalgias.  Skin: Negative for rash and wound.  Allergic/Immunologic: Negative for food allergies and immunocompromised state.  Neurological: Negative for dizziness and headaches.  Hematological: Negative for adenopathy. Does not bruise/bleed easily.  Psychiatric/Behavioral: Positive for dysphoric mood and sleep disturbance. The patient is nervous/anxious.     Blood pressure (!) 155/99, pulse (!) 144, temperature 97.6 F (36.4 C), temperature source Oral, resp. rate 17, height 5\' 10"  (1.778 m), weight 104.3 kg, SpO2 98 %.Body mass index is 32.99 kg/m.  General Appearance: Casual and Fairly Groomed  Eye Contact:  Good  Speech:  Clear and Coherent  Volume:  Normal  Mood:  Anxious  Affect:  Congruent  Thought Process:  Coherent and Linear  Orientation:  Full (Time, Place, and Person)  Thought Content:  Logical  Suicidal Thoughts:  No  Homicidal Thoughts:  No  Memory:  Immediate;   Fair Recent;   Fair Remote;   Fair  Judgement:  Fair  Insight:  Fair  Psychomotor Activity:  Normal  Concentration:  Concentration: Fair and Attention Span: Fair  Recall:  [employer] of Knowledge:  Fair  Language:  Fair  Akathisia:  Negative  Handed:  Right  AIMS (if indicated):     Assets:  Communication Skills Desire for Improvement Financial Resources/Insurance Housing Intimacy Resilience Social  Support Transportation  ADL's:  Intact  Cognition:  WNL  Sleep:  Number of Hours: 7.5     Treatment Plan Summary: Daily contact with patient to assess and evaluate symptoms and progress in treatment and Medication management PLAN OF CARE:Patient is a 69 year old male who presents voluntarily for worsening mood and suicidal ideation without plan or intent. Mood gradually worsening over last two weeks. Continue Prozac to 40 mg daily. Continue home medications for chronic medical issues.Add vistaril 25 mg TID PRN for anxiety.   [name], MD 11/13/2020, 9:50 AM

## 2020-11-13 NOTE — Progress Notes (Signed)
Patient presents with sad, flat affect. Continues to endorse depression. Visible in milieu during snack. Waited up for night meds. Slept till around 2 up in hall to dayroom. Came back went to bed. Denies SI, HI, AVH.  Encouragement and support provided. Safety checks maintained. Medications given as prescribed. Pt receptive and remains safe on unit with q 15 min checks.

## 2020-11-13 NOTE — Progress Notes (Signed)
Recreation Therapy Notes   Date: 11/13/2020  Time: 9:30 am   Location: Craft room  Behavioral response: Appropriate  Intervention Topic: Emotions   Discussion/Intervention:  Group content on today was focused on emotions. The group identified what emotions are and why it is important to have emotions. Patients expressed some positive and negative emotions. Individuals gave some past experiences on how they normally dealt with emotions in the past. The group described some positive ways to deal with emotions in the future. Patients participated in the intervention "Name the Jerl Santos" where individuals were given a chance to experience different emotions.  Clinical Observations/Feedback: Patient came to group and was focused on what peers and staff had to say about emotions. He expressed happiness is a common emotion for him. Individual was social with peers and staff participating in the intervention.  Talyn Eddie LRT/CTRS         Dyke Weible 11/13/2020 12:04 PM

## 2020-11-13 NOTE — Progress Notes (Signed)
Recreation Therapy Notes  INPATIENT RECREATION THERAPY ASSESSMENT  Patient Details Name: ALLARD LIGHTSEY MRN: [id number] DOB: [birth year] Today's Date: 11/13/2020       Information Obtained From: Patient  Able to Participate in Assessment/Interview: Yes  Patient Presentation: Responsive  Reason for Admission (Per Patient): Active Symptoms  Patient Stressors:    Coping Skills:   [place] (2+):  Social - [place] (Wood work)  Psychologist, counselling of Recreation/Participation: Financial risk analyst Resources:  Yes  Community Resources:  [employer]  Current Use: Yes  If no, Barriers?:    Expressed Interest in [place] of Residence:  Insurance underwriter  Patient Main Form of Transportation:    Patient Strengths:  My values  Patient Identified Areas of Improvement:  My depression  Patient Goal for Hospitalization:  Cope with my  depression and go home  Current SI (including self-harm):  No  Current HI:  No  Current AVH: No  Staff Intervention Plan: Group Attendance,Collaborate with Interdisciplinary Treatment Team  Consent to Intern Participation: N/A  Sharetta Ricchio 11/13/2020, 2:26 PM

## 2020-11-13 NOTE — B[facility] Group Notes (Signed)
LCSW Group Therapy Note  11/13/2020 2:18 PM  Type of Therapy/Topic:  Group Therapy:  Balance in Life  Participation Level:  Minimal  Description of Group:    This group will address the concept of balance and how it feels and looks when one is unbalanced. Patients will be encouraged to process areas in their lives that are out of balance and identify reasons for remaining unbalanced. Facilitators will guide patients in utilizing problem-solving interventions to address and correct the stressor making their life unbalanced. Understanding and applying boundaries will be explored and addressed for obtaining and maintaining a balanced life. Patients will be encouraged to explore ways to assertively make their unbalanced needs known to significant others in their lives, using other group members and facilitator for support and feedback.  Therapeutic Goals: 1. Patient will identify two or more emotions or situations they have that consume much of in their lives. 2. Patient will identify signs/triggers that life has become out of balance:  3. Patient will identify two ways to set boundaries in order to achieve balance in their lives:  4. Patient will demonstrate ability to communicate their needs through discussion and/or role plays  Summary of Patient Progress: Patient participated in the discussion briefly but was unable to tolerate distractions from other peers. Did not want to participate in the coloring/mindfulness activity.   Therapeutic Modalities:   Cognitive Behavioral Therapy Solution-Focused Therapy Assertiveness Training  [employer]. Guerry Bruin, MSW, LCSW, [place] 11/13/2020 2:18 PM

## 2020-11-13 NOTE — BHH Counselor (Signed)
CSW provided patient with print out of local providers that accepts his insurance. Patient was looking through the list.  Assunta Curtis, MSW, LCSW 11/13/2020 3:53 PM

## 2020-11-14 ENCOUNTER — Other Ambulatory Visit: Payer: Self-pay | Admitting: Behavioral Health

## 2020-11-14 MED ORDER — FLUOXETINE HCL 40 MG PO CAPS: 40.0000 mg | ORAL_CAPSULE | Freq: Every day | ORAL | 1 refills | Status: DC

## 2020-11-14 MED ORDER — TEMAZEPAM 15 MG PO CAPS: 15.0000 mg | ORAL_CAPSULE | Freq: Every day | ORAL | 1 refills | Status: DC

## 2020-11-14 MED ORDER — HYDROXYZINE HCL 50 MG PO TABS
50.0000 mg | ORAL_TABLET | Freq: Three times a day (TID) | ORAL | 0 refills | Status: DC | PRN
Start: 2020-11-14 — End: 2020-12-08

## 2020-11-14 NOTE — Progress Notes (Signed)
Patient ID: Jeremy Reese, male   DOB: 1951-04-05, 69 y.o.   MRN: [id number]  Discharge Note:  Patient denies SI/HI/AVH at this time. Discharge instructions, AVS, prescriptions, and transition record gone over with patient. Patient belongings returned to patient. Patient agrees to comply with medication management, follow-up visit, and outpatient therapy. Patient questions and concerns addressed and answered. Patient wheeled off unit. Patient discharged to home with his Wife.

## 2020-11-14 NOTE — Progress Notes (Signed)
D- Patient alert and oriented. Patient presents in an anxious, but pleasant mood on assessment stating that he slept "pretty good" last night and had continued complaints of back pain. Patient rated his pain a "5/10", in which he did request PRN medication from this Probation officer. Patient endorsed both depression/anxiety stating that "getting out" is what's making him feel this way. Patient denies SI, HI, AVH at this time. Patient's goal for today is "being released", in which he will "meet with Dr. Domingo Cocking", in order to achieve his goal.  A- Scheduled medications administered to patient, per MD orders. Support and encouragement provided.  Routine safety checks conducted every 15 minutes.  Patient informed to notify staff with problems or concerns.  R- No adverse drug reactions noted. Patient contracts for safety at this time. Patient compliant with medications and treatment plan. Patient receptive, calm, and cooperative. Patient interacts well with others on the unit.  Patient remains safe at this time.

## 2020-11-14 NOTE — Progress Notes (Signed)
Recreation Therapy Notes   Date: 11/14/2020  Time: 9:30 am   Location: Craft room     Behavioral response: N/A   Intervention Topic: Communication     Discussion/Intervention: Patient did not attend group.   Clinical Observations/Feedback:  Patient did not attend group.   Flora Ratz LRT/CTRS        Edward Trevino 11/14/2020 11:37 AM

## 2020-11-14 NOTE — Progress Notes (Signed)
Recreation Therapy Notes  INPATIENT RECREATION TR PLAN  Patient Details Name: Jeremy Reese MRN: [id number] DOB: 19-Feb-1951 Today's Date: 11/14/2020  Rec Therapy Plan Is patient appropriate for Therapeutic Recreation?: Yes Treatment times per week: at least 3 Estimated Length of Stay: 5-7 days TR Treatment/Interventions: Group participation (Comment)  Discharge Criteria Pt will be discharged from therapy if:: Discharged Treatment plan/goals/alternatives discussed and agreed upon by:: Patient/family  Discharge Summary Short term goals set: Patient will identify 3 triggers for depression within 5 recreation therapy group sessions Short term goals met: Complete Progress toward goals comments: Groups attended Which groups?: Other (Comment) (Emotions, Self-care) Reason goals not met: N/A Therapeutic equipment acquired: N/A Reason patient discharged from therapy: Discharge from hospital Pt/family agrees with progress & goals achieved: Yes Date patient discharged from therapy: 11/14/20   Gavriella Hearst 11/14/2020, 11:40 AM

## 2020-11-14 NOTE — Progress Notes (Signed)
  [facility] Adult Case Management Discharge Plan :  Will you be returning to the same living situation after discharge:  Yes,  Patient to return to place of residence.  At discharge, do you have transportation home?: Yes,  Patient's spouse to pick up patient Do you have the ability to pay for your medications: Yes,  [employer] Medicare  Release of information consent forms completed and in the chart;  Patient's signature needed at discharge.  Patient to Follow up at:  Follow-up Information    [facility] Follow up.   Why: Please reach out to phone number provided as follow up to voicemail left by social worker in order to schedule aftercare appointment.  Contact information: [address]  [place], [place] [address]. [phone]              Next level of care provider has access to Jud and Suicide Prevention discussed: Yes,  SPE completed with patient, patient refused collateral contact  Have you used any form of tobacco in the last 30 days? (Cigarettes, Smokeless Tobacco, Cigars, and/or Pipes): Yes  Has patient been referred to the Quitline?: Patient refused referral  Patient has been referred for addiction treatment: N/A  Durenda Hurt, LCSWA 11/14/2020, 11:11 AM

## 2020-11-14 NOTE — BHH Suicide Risk Assessment (Signed)
[facility] Discharge Suicide Risk Assessment   Principal Problem: Severe recurrent major depression without psychotic features [facility]) Discharge Diagnoses: Principal Problem:   Severe recurrent major depression without psychotic features ([place]) Active Problems:   Hyperlipidemia   Chronic atrial fibrillation (HCC)   S/P CABG x 3   Hypertension   Osteoarthritis   Spinal stenosis of lumbar region without neurogenic claudication   Total Time spent with patient: 30 minutes  Musculoskeletal: Strength & Muscle Tone: within normal limits Gait & Station: Ambulates with walker Patient leans: Front  Psychiatric Specialty Exam: Review of Systems  Constitutional: Positive for fatigue. Negative for appetite change.  HENT: Negative for rhinorrhea and sore throat.   Eyes: Negative for photophobia and visual disturbance.  Respiratory: Negative for cough and shortness of breath.   Cardiovascular: Negative for chest pain and palpitations.  Gastrointestinal: Negative for constipation, diarrhea, nausea and vomiting.  Endocrine: Negative for cold intolerance and heat intolerance.  Genitourinary: Negative for difficulty urinating and dysuria.  Musculoskeletal: Negative for arthralgias and myalgias.  Skin: Negative for rash and wound.  Allergic/Immunologic: Negative for food allergies and immunocompromised state.  Neurological: Negative for dizziness and headaches.  Hematological: Negative for adenopathy. Does not bruise/bleed easily.  Psychiatric/Behavioral: Negative for hallucinations and suicidal ideas. The patient is not nervous/anxious.     Blood pressure (!) 117/93, pulse (!) 57, temperature 97.8 F (36.6 C), temperature source Oral, resp. rate 17, height 5\' 10"  (1.778 m), weight 104.3 kg, SpO2 100 %.Body mass index is 32.99 kg/m.  General Appearance: Well Groomed  Engineer, water::  Good  Speech:  Clear and Coherent and Normal Rate  Volume:  Normal  Mood:  Euthymic  Affect:  Congruent  Thought  Process:  Coherent and Linear  Orientation:  Full (Time, Place, and Person)  Thought Content:  Logical  Suicidal Thoughts:  No  Homicidal Thoughts:  No  Memory:  Immediate;   Fair Recent;   Fair Remote;   Fair  Judgement:  Fair  Insight:  Fair  Psychomotor Activity:  Normal  Concentration:  Fair  Recall:  [employer] of Knowledge:Fair  Language: Fair  Akathisia:  Negative  Handed:  Right  AIMS (if indicated):     Assets:  Communication Skills Desire for Improvement Financial Resources/Insurance Housing Intimacy Resilience Social Support Transportation  Sleep:  Number of Hours: 7  Cognition: WNL  ADL's:  Intact   Mental Status Per Nursing Assessment::   On Admission:  Suicidal ideation indicated by patient  Demographic Factors:  Male, Age 68 or older and Caucasian  Loss Factors: NA  Historical Factors: Prior suicide attempts  Risk Reduction Factors:   Sense of responsibility to family, Religious beliefs about death, Living with another person, especially a relative, Positive social support, Positive therapeutic relationship and Positive coping skills or problem solving skills  Continued Clinical Symptoms:  Depression:   Recent sense of peace/wellbeing Previous Psychiatric Diagnoses and Treatments  Cognitive Features That Contribute To Risk:  None    Suicide Risk:  Minimal: No identifiable suicidal ideation.  Patients presenting with no risk factors but with morbid ruminations; may be classified as minimal risk based on the severity of the depressive symptoms    Plan Of Care/Follow-up recommendations:  Activity:  as tolerated Diet:  low-sodium, heart healthy diet  [name], MD 11/14/2020, 9:22 AM

## 2020-11-14 NOTE — Plan of Care (Signed)
Problem: Depression Goal: STG - Patient will identify 3 triggers for depression within 5 recreation therapy group sessions Description: STG - Patient will identify 3 triggers for depression within 5 recreation therapy group sessions Outcome: Completed/Met   

## 2020-11-14 NOTE — BHH Counselor (Signed)
CSW met with patient to discuss aftercare options. Patient expressed interest in private practice therapy with [facility] in [place], [place]. CSW contacted Miguel Dibble, LCSW in order to arrange appointment. Voicemail was reached and CSW requested callback; no patient information was left in the message. CSW informed patient of outcome and provided contact information in case call is not returned prior to discharge. Patient expressed no alternative aftercare preferences at this time. CSW will continue to monitor voicemail for coordination of aftercare.   Durenda Hurt, MSW, [place], LCASA 11/14/2020 9:44 AM

## 2020-11-14 NOTE — Progress Notes (Signed)
BRIEF PHARMACY NOTE   This patient attended and participated in Medication Management Group counseling led by [facility] staff pharmacist.  This interactive class reviews basic information about prescription medications and education on personal responsibility in medication management.  The class also includes general knowledge of 3 main classes of behavioral medications, including antipsychotics, antidepressants, and mood stabilizers.     Patient behavior was appropriate for group setting.   Educational materials sourced from:  "Medication Do's and Don'ts" from [email or web address]   "Mental Health Medications" from [email or web address]#part [place], PharmD, BCPS Clinical Pharmacist 11/14/2020 7:29 AM

## 2020-11-14 NOTE — Discharge Summary (Signed)
Physician Discharge Summary Note  Patient:  Jeremy Reese is an 69 y.o., male MRN:  [id number] DOB:  September 06, 1951 Patient phone:  [phone] (home)  Patient address:   [place] [address],  Total Time spent with patient: 30 minutes  Date of Admission:  11/11/2020 Date of Discharge: 11/14/2020  Reason for Admission:  69 year old male with history of depression who presented voluntarily to emergency room for worsening depression and suicidal thoughts.  Principal Problem: Severe recurrent major depression without psychotic features [facility]) Discharge Diagnoses: Principal Problem:   Severe recurrent major depression without psychotic features ([place]) Active Problems:   Hyperlipidemia   Chronic atrial fibrillation (HCC)   S/P CABG x 3   Hypertension   Osteoarthritis   Spinal stenosis of lumbar region without neurogenic claudication   Past Psychiatric History: Patient has a past history of depression. In 1992 he had a suicide attempt by overdose. Had a hospitalization at that time. Saw Dr. Thurmond Butts for years. Much of the time diagnosed with seasonal affective disorder and took fluoxetine and used a light box in the winter. Between that time and now had not had any other major depressive episodes. No history of mania.  Past Medical History:  Past Medical History:  Diagnosis Date   Atrial flutter, paroxysmal ([place])    Carotid artery disease without cerebral infarction [facility])    Coronary artery disease 2011   Flatback syndrome of thoracolumbar region    H/O calcium pyrophosphate deposition disease (CPPD)    History of TIA (transient ischemic attack)    Hyperlipidemia    Paroxysmal A-fib (HCC)    Spinal stenosis of lumbar region without neurogenic claudication     Past Surgical History:  Procedure Laterality Date   BILATERAL CARPAL TUNNEL RELEASE     CARDIAC CATHETERIZATION     CIRCUMCISION, NON-NEWBORN     CLIPPING OF ATRIAL APPENDAGE N/A 09/18/2020    Procedure: CLIPPING OF ATRIAL APPENDAGE USING 56 ATRICURE LAA  EXCLUSION SYSTEM;  Surgeon: Ivin Poot, MD;  Location: [place];  Service: Open Heart Surgery;  Laterality: N/A;   CORONARY ANGIOPLASTY WITH STENT PLACEMENT  2011   CORONARY ARTERY BYPASS GRAFT N/A 09/18/2020   Procedure: CORONARY ARTERY BYPASS GRAFTING (CABG)X 3, ON PUMP, USING LEFT INTERAL MAMMARY ARTERY AND ENDOSCOPICALLY HARVESTED RIGHT GREATER SAPHENOUS VEIN. LIMA TO LAD, SVG TO OM, SVG TO PD;  Surgeon: Ivin Poot, MD;  Location: [place];  Service: Open Heart Surgery;  Laterality: N/A;   ENDOVEIN HARVEST OF GREATER SAPHENOUS VEIN Right 09/18/2020   Procedure: ENDOVEIN HARVEST OF GREATER SAPHENOUS VEIN;  Surgeon: Ivin Poot, MD;  Location: [place];  Service: Open Heart Surgery;  Laterality: Right;   LEFT HEART CATH AND CORONARY ANGIOGRAPHY Left 09/12/2020   Procedure: LEFT HEART CATH AND CORONARY ANGIOGRAPHY;  Surgeon: Minna Merritts, MD;  Location: [place] CV [facility];  Service: Cardiovascular;  Laterality: Left;   TEE WITHOUT CARDIOVERSION N/A 09/18/2020   Procedure: TRANSESOPHAGEAL ECHOCARDIOGRAM (TEE);  Surgeon: Prescott Gum, Collier Salina, MD;  Location: [place];  Service: Open Heart Surgery;  Laterality: N/A;   Family History:  Family History  Problem Relation Age of Onset   Heart disease Mother 56       CABG x 3    Hypertension Father    Family Psychiatric  History: Denies Social History:  Social History   Substance and Sexual Activity  Alcohol Use Yes   Comment: rare      Social History   Substance  and Sexual Activity  Drug Use No    Social History   Socioeconomic History   Marital status: Married    Spouse name: Not on file   Number of children: Not on file   Years of education: Not on file   Highest education level: Not on file  Occupational History   Not on file  Tobacco Use   Smoking status: Former Smoker    Packs/day: 0.25    Years: 40.00    Pack years: 10.00    Types:  Cigarettes   Smokeless tobacco: Never Used  Scientific laboratory technician Use: Never used  Substance and Sexual Activity   Alcohol use: Yes    Comment: rare    Drug use: No   Sexual activity: Not on file  Other Topics Concern   Not on file  Social History Narrative   Not on file   Social Determinants of Health   Financial Resource Strain: Not on file  Food Insecurity: Not on file  Transportation Needs: Not on file  Physical Activity: Not on file  Stress: Not on file  Social Connections: Not on file    Hospital Course:   69 year old male with history of depression who presented voluntarily to emergency room for worsening depression and suicidal thoughts. He believes this most recent episode of depression was triggered by recent CABG in October. He was briefly placed on Lexapro which he felt caused shortness of breath, and ceased taking. He notes that in the past he was on Prozac and light therapy that controlled his depression for many years. Prozac was restarted in the hospital, and titrated to 40 mg daily. He was also given vistaril PRN for anxiety. Mood gradually improved, and he no longer had suicidal ideations. He also denied homicidal ideations, visual hallucinations, and auditory hallucinations at time of discharge.   Physical Findings: AIMS:  , ,  ,  ,    CIWA:    COWS:     Musculoskeletal: Strength & Muscle Tone: within normal limits Gait & Station: Ambulates with walker Patient leans: Front  Psychiatric Specialty Exam: Physical Exam Vitals and nursing note reviewed.  HENT:     Head: Normocephalic.     Right Ear: External ear normal.     Left Ear: External ear normal.     Nose: Nose normal.     Mouth/Throat:     Mouth: Mucous membranes are moist.     Pharynx: Oropharynx is clear.  Eyes:     Extraocular Movements: Extraocular movements intact.     Conjunctiva/sclera: Conjunctivae normal.     Pupils: Pupils are equal, round, and reactive to light.   Cardiovascular:     Rate and Rhythm: Normal rate.     Pulses: Normal pulses.  Pulmonary:     Effort: Pulmonary effort is normal.     Breath sounds: Normal breath sounds.  Abdominal:     General: Abdomen is flat.     Palpations: Abdomen is soft.  Musculoskeletal:     Cervical back: Normal range of motion and neck supple.     Comments: Ambulates with walker, forward lean   Skin:    General: Skin is warm and dry.  Neurological:     General: No focal deficit present.     Mental Status: He is alert and oriented to person, place, and time.  Psychiatric:        Mood and Affect: Mood normal.        Behavior: Behavior normal.  Thought Content: Thought content normal.        Judgment: Judgment normal.     Review of Systems  Constitutional: Positive for fatigue. Negative for appetite change.  HENT: Negative for rhinorrhea and sore throat.   Eyes: Negative for photophobia and visual disturbance.  Respiratory: Negative for cough and shortness of breath.   Cardiovascular: Negative for chest pain and palpitations.  Gastrointestinal: Negative for constipation, diarrhea, nausea and vomiting.  Endocrine: Negative for cold intolerance and heat intolerance.  Genitourinary: Negative for difficulty urinating and dysuria.  Musculoskeletal: Negative for arthralgias and myalgias.  Skin: Negative for rash and wound.  Allergic/Immunologic: Negative for food allergies and immunocompromised state.  Neurological: Negative for dizziness and headaches.  Hematological: Negative for adenopathy. Does not bruise/bleed easily.  Psychiatric/Behavioral: Negative for hallucinations and suicidal ideas. The patient is not nervous/anxious.     Blood pressure (!) 117/93, pulse (!) 57, temperature 97.8 F (36.6 C), temperature source Oral, resp. rate 17, height 5\' 10"  (1.778 m), weight 104.3 kg, SpO2 100 %.Body mass index is 32.99 kg/m.  General Appearance: Well Groomed  Engineer, water::  Good  Speech:  Clear  and Coherent and Normal Rate  Volume:  Normal  Mood:  Euthymic  Affect:  Congruent  Thought Process:  Coherent and Linear  Orientation:  Full (Time, Place, and Person)  Thought Content:  Logical  Suicidal Thoughts:  No  Homicidal Thoughts:  No  Memory:  Immediate;   Fair Recent;   Fair Remote;   Fair  Judgement:  Fair  Insight:  Fair  Psychomotor Activity:  Normal  Concentration:  Fair  Recall:  [place]  Language: Fair  Akathisia:  Negative  Handed:  Right  AIMS (if indicated):     Assets:  Communication Skills Desire for Improvement Financial Resources/Insurance Housing Intimacy Resilience Social Support Transportation  Sleep:  Number of Hours: 7  Cognition: WNL  ADL's:  Intact        Have you used any form of tobacco in the last 30 days? (Cigarettes, Smokeless Tobacco, Cigars, and/or Pipes): Yes  Has this patient used any form of tobacco in the last 30 days? (Cigarettes, Smokeless Tobacco, Cigars, and/or Pipes) Yes, A prescription for an FDA-approved tobacco cessation medication was offered at discharge and the patient refused  Blood Alcohol level:  Lab Results  Component Value Date   [facility] <10 08/21/3006    Metabolic Disorder Labs:  Lab Results  Component Value Date   HGBA1C 6.4 (H) 09/16/2020   MPG 137 09/16/2020   No results found for: PROLACTIN Lab Results  Component Value Date   CHOL 108 09/05/2020   TRIG 76 09/05/2020   HDL 44 09/05/2020   CHOLHDL 2.5 09/05/2020   LDLCALC 48 09/05/2020   [place] 95 04/02/2015    See Psychiatric Specialty Exam and Suicide Risk Assessment completed by Attending Physician prior to discharge.  Discharge destination:  Home  Is patient on multiple antipsychotic therapies at discharge:  No   Has Patient had three or more failed trials of antipsychotic monotherapy by history:  No  Recommended Plan for Multiple Antipsychotic Therapies: NA  Discharge Instructions    Diet - low sodium heart  healthy   Complete by: As directed    Increase activity slowly   Complete by: As directed      Allergies as of 11/14/2020      Reactions   Propafenone Anxiety   Codeine Nausea Only   Penicillins Rash  Medication List    STOP taking these medications   potassium chloride 10 MEQ tablet Commonly known as: KLOR-CON     TAKE these medications     Indication  aspirin EC 81 MG tablet Take 1 tablet (81 mg total) by mouth daily.  Indication: Coronary Bypass Surgery   carvedilol 25 MG tablet Commonly known as: COREG Take 1 tablet (25 mg total) by mouth 2 (two) times daily with a meal.  Indication: Atrial Fibrillation   Eliquis 5 MG Tabs tablet Generic drug: apixaban TAKE ONE TABLET TWICE DAILY What changed: how much to take  Indication: Cerebrovascular accident secondary to Atrial Fibrillation   ezetimibe 10 MG tablet Commonly known as: ZETIA TAKE ONE (1) TABLET EACH DAY What changed: See the new instructions.  Indication: Disease involving Lipid Deposits in the Arteries   FLUoxetine 40 MG capsule Commonly known as: PROZAC Take 1 capsule (40 mg total) by mouth at bedtime.  Indication: Major Depressive Disorder   gabapentin 400 MG capsule Commonly known as: NEURONTIN Take 400 mg by mouth 3 (three) times daily.  Indication: Neuropathic Pain   hydrOXYzine 50 MG tablet Commonly known as: ATARAX/VISTARIL Take 1 tablet (50 mg total) by mouth 3 (three) times daily as needed for anxiety.  Indication: Feeling Anxious   melatonin 5 MG Tabs Take 5 mg by mouth at bedtime.  Indication: Trouble Sleeping   nitroGLYCERIN 0.4 MG SL tablet Commonly known as: NITROSTAT Place 1 tablet (0.4 mg total) under the tongue every 5 (five) minutes as needed for chest pain.  Indication: Acute Angina Pectoris   rosuvastatin 40 MG tablet Commonly known as: CRESTOR Take 1 tablet (40 mg total) by mouth daily.  Indication: Disease involving Lipid Deposits in the Arteries   temazepam 15  MG capsule Commonly known as: RESTORIL Take 15 mg by mouth at bedtime.  Indication: Trouble Sleeping   traMADol 50 MG tablet Commonly known as: ULTRAM Take 50-100 mg by mouth 2 (two) times daily as needed for moderate pain.  Indication: Pain        Follow-up recommendations:  Activity:  as tolerated Diet:  low-sodium, heart healthy diet  Comments:  30-day script with 1 refill for Prozac and Vistaril sent to [facility]  Signed: [name], MD 11/14/2020, 9:25 AM

## 2020-12-03 ENCOUNTER — Encounter: Payer: Self-pay | Admitting: Cardiothoracic Surgery

## 2020-12-03 ENCOUNTER — Ambulatory Visit (INDEPENDENT_AMBULATORY_CARE_PROVIDER_SITE_OTHER): Payer: Self-pay | Admitting: Cardiothoracic Surgery

## 2020-12-03 ENCOUNTER — Other Ambulatory Visit: Payer: Self-pay

## 2020-12-03 VITALS — BP 149/90 | HR 75 | Resp 20 | Ht 70.0 in | Wt 231.0 lb

## 2020-12-03 DIAGNOSIS — T8189XD Other complications of procedures, not elsewhere classified, subsequent encounter: Secondary | ICD-10-CM

## 2020-12-03 DIAGNOSIS — F3289 Other specified depressive episodes: Secondary | ICD-10-CM | POA: Insufficient documentation

## 2020-12-03 DIAGNOSIS — I2510 Atherosclerotic heart disease of native coronary artery without angina pectoris: Secondary | ICD-10-CM

## 2020-12-03 DIAGNOSIS — T8189XA Other complications of procedures, not elsewhere classified, initial encounter: Secondary | ICD-10-CM | POA: Insufficient documentation

## 2020-12-03 DIAGNOSIS — Z951 Presence of aortocoronary bypass graft: Secondary | ICD-10-CM

## 2020-12-03 DIAGNOSIS — F32A Depression, unspecified: Secondary | ICD-10-CM

## 2020-12-03 DIAGNOSIS — Z09 Encounter for follow-up examination after completed treatment for conditions other than malignant neoplasm: Secondary | ICD-10-CM

## 2020-12-03 MED ORDER — ALPRAZOLAM 0.5 MG PO TABS: 0.5000 mg | ORAL_TABLET | Freq: Two times a day (BID) | ORAL | 0 refills | Status: DC | PRN

## 2020-12-03 NOTE — Progress Notes (Signed)
PCP is Cipriano Mile, NP Referring Provider is Cipriano Mile, NP  Chief Complaint  Patient presents with  . Routine Post Op    1 month f/u HX of CABG    HPI: Patient returns for 55-month follow-up after urgent CABG x3 and clipping of left atrial appendage to review progress and to discuss further advancement in activities. Following his last office visit the patient was admitted to the behavioral health center for significant depression and hospitalized for 4-5 days.  He was placed on Prozac for depression 40 mg daily.  He states he has been on that in the past and that it has helped.  He still is complaining of some symptoms of depression and will try to obtain assistance from his primary care physician.  Denies chest pain dyspnea on exertion edema.  He still has poor appetite, low energy and depression.  He understands that at 3 months post CABG he can resume normal activities and may lift more than 10 pounds and may drive his small tractor.  The patient is requested some medication to help with his anxiety and he will be given a short course of low-dose Xanax  Past Medical History:  Diagnosis Date  . Atrial flutter, paroxysmal ([place])   . Carotid artery disease without cerebral infarction ([place])   . Coronary artery disease 2011  . Flatback syndrome of thoracolumbar region   . H/O calcium pyrophosphate deposition disease (CPPD)   . History of TIA (transient ischemic attack)   . Hyperlipidemia   . Paroxysmal A-fib ([place])   . Spinal stenosis of lumbar region without neurogenic claudication     Past Surgical History:  Procedure Laterality Date  . BILATERAL CARPAL TUNNEL RELEASE    . CARDIAC CATHETERIZATION    . CIRCUMCISION, NON-NEWBORN    . CLIPPING OF ATRIAL APPENDAGE N/A 09/18/2020   Procedure: CLIPPING OF ATRIAL APPENDAGE USING 33 ATRICURE LAA  EXCLUSION SYSTEM;  Surgeon: Ivin Poot, MD;  Location: [place];  Service: Open Heart Surgery;  Laterality: N/A;  . CORONARY ANGIOPLASTY WITH  STENT PLACEMENT  2011  . CORONARY ARTERY BYPASS GRAFT N/A 09/18/2020   Procedure: CORONARY ARTERY BYPASS GRAFTING (CABG)X 3, ON PUMP, USING LEFT INTERAL MAMMARY ARTERY AND ENDOSCOPICALLY HARVESTED RIGHT GREATER SAPHENOUS VEIN. LIMA TO LAD, SVG TO OM, SVG TO PD;  Surgeon: Ivin Poot, MD;  Location: [place];  Service: Open Heart Surgery;  Laterality: N/A;  . ENDOVEIN HARVEST OF GREATER SAPHENOUS VEIN Right 09/18/2020   Procedure: ENDOVEIN HARVEST OF GREATER SAPHENOUS VEIN;  Surgeon: Ivin Poot, MD;  Location: [place];  Service: Open Heart Surgery;  Laterality: Right;  . LEFT HEART CATH AND CORONARY ANGIOGRAPHY Left 09/12/2020   Procedure: LEFT HEART CATH AND CORONARY ANGIOGRAPHY;  Surgeon: Minna Merritts, MD;  Location: [place] [facility];  Service: Cardiovascular;  Laterality: Left;  . TEE WITHOUT CARDIOVERSION N/A 09/18/2020   Procedure: TRANSESOPHAGEAL ECHOCARDIOGRAM (TEE);  Surgeon: Prescott Gum, Collier Salina, MD;  Location: [place];  Service: Open Heart Surgery;  Laterality: N/A;    Family History  Problem Relation Age of Onset  . Heart disease Mother 82       CABG x 3   . Hypertension Father     Social History Social History   Tobacco Use  . Smoking status: Former Smoker    Packs/day: 0.25    Years: 40.00    Pack years: 10.00    Types: Cigarettes  . Smokeless tobacco: Never Used  Vaping Use  .  Vaping Use: Never used  Substance Use Topics  . Alcohol use: Yes    Comment: rare   . Drug use: No    Current Outpatient Medications  Medication Sig Dispense Refill  . aspirin EC 81 MG tablet Take 1 tablet (81 mg total) by mouth daily. 90 tablet 3  . carvedilol (COREG) 25 MG tablet Take 1 tablet (25 mg total) by mouth 2 (two) times daily with a meal. 60 tablet 3  . ELIQUIS 5 MG TABS tablet TAKE ONE TABLET TWICE DAILY (Patient taking differently: Take 5 mg by mouth 2 (two) times daily.) 60 tablet 1  . ezetimibe (ZETIA) 10 MG tablet TAKE ONE (1) TABLET EACH DAY (Patient taking  differently: Take 10 mg by mouth daily.) 30 tablet 3  . FLUoxetine (PROZAC) 40 MG capsule Take 1 capsule (40 mg total) by mouth at bedtime. 30 capsule 1  . gabapentin (NEURONTIN) 400 MG capsule Take 400 mg by mouth 3 (three) times daily.     . hydrOXYzine (ATARAX/VISTARIL) 50 MG tablet Take 1 tablet (50 mg total) by mouth 3 (three) times daily as needed for anxiety. 90 tablet 0  . Melatonin 5 MG TABS Take 5 mg by mouth at bedtime.     . nitroGLYCERIN (NITROSTAT) 0.4 MG SL tablet Place 1 tablet (0.4 mg total) under the tongue every 5 (five) minutes as needed for chest pain. 25 tablet 3  . rosuvastatin (CRESTOR) 40 MG tablet Take 1 tablet (40 mg total) by mouth daily. 90 tablet 3  . temazepam (RESTORIL) 15 MG capsule Take 1 capsule (15 mg total) by mouth at bedtime. 30 capsule 1  . traMADol (ULTRAM) 50 MG tablet Take 50-100 mg by mouth 2 (two) times daily as needed for moderate pain.     No current facility-administered medications for this visit.    Allergies  Allergen Reactions  . Propafenone Anxiety  . Codeine Nausea Only  . Penicillins Rash    Review of Systems  He is not eating well and has lost some weight since last visit He is not drinking or smoking He denies any clicking or popping from the sternal incision He denies edema  BP (!) 149/90   Pulse 75   Resp 20   Ht 5\' 10"  (1.778 m)   Wt 231 lb (104.8 kg)   SpO2 95% Comment: RA  BMI 33.15 kg/m  Physical Exam      Exam    General- alert and comfortable.  Sternal incision well-healed.    Neck- no JVD, no cervical adenopathy palpable, no carotid bruit   Lungs- clear without rales, wheezes   Cor- irregular rate and rhythm, no murmur , gallop   Abdomen- soft, non-tender   Extremities - warm, non-tender, minimal edema   Neuro- oriented, appropriate, no focal weakness   Diagnostic Tests: None today  Impression: Slow progress recovering from CABG x3 with left atrial clip for unstable angina and severe three-vessel CAD.   He has been contacted by the cardiac rehab patient at [facility] but is not ready to start due to his depression  Plan: Continue current medications.  He may relax his sternal precautions and increase his activities as noted.  He is recommended to contact his primary care physician to help with management of depression.  When he feels ready he should contact the [place] cardiac rehab program to start their post CABG program.  Return here as needed.   [facility], MD [facility] [phone]

## 2020-12-07 NOTE — Progress Notes (Signed)
Cardiology Office Note  Date:  12/08/2020   ID:  Jeremy Reese, DOB January 07, 1951, MRN [id number]  PCP:  Cipriano Mile, NP   Chief Complaint  Patient presents with  . Other    2 month follow up. Patient c.o depression. Meds reviewed verbally with patient.     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 Lucky Cowboy,  atrial fibrillation dating back to 2011  severe back pain not a surgical candidate per the surgeons given his severe bilateral carotid disease Old CVA on CT scan head in 2015 who presents for follow-up of his chronic atrial fibrillation,, PAD, CAD with recent CABG for left main disease  Last seen in clinic October 06, 2020 At that time was having difficulty sleeping, poor energy On tramadol for his back Had not started rehab It was recommended he start working with PT then transition to cardiac rehab He was started on Lasix 40 daily for lower extremity edema  Admitted to the hospital with suicidal ideation November 10, 2020 Inpatient stay in behavioral health Worsening depression  Seen by CT surgery December 03, 2020 Was given short course of Xanax Told to stop by PMD  Continues to have difficulty sleeping  EKG personally reviewed by myself on todays visit  shows atrial fibrillation with ventricular rate 81 bpm, T wave abnormality anterolateral, inferior leads  Past cardiac history reviewed cardiac catheterization October 2021 Images very concerning for critical ostial/proximal left circumflex disease estimated 95% or greater LAD with moderate to severe ostial/proximal disease though difficult to visualize, and 60% mid disease  Cath 09/12/2020  Ost Cx to Prox Cx lesion is 95% stenosed.  Ost LAD to Prox LAD lesion is 70% stenosed.  Mid LAD lesion is 60% stenosed.  worsening anginal symptoms over the past several weeks  with clear escalation of his isosorbide to relieve his pressure Critical ostial left circumflex disease of a large vessel (stent stenosis, heavily calcified), nondominant right, also with disease of the LAD Evaluated for CABG.   Echo: EF 60%, calcified aortic valve  operating room details 09/18/2020.  He underwent CABG x 3 utilizing LIMA to LAD, SVG to OM1, and SVG to PDA.  He had clipping of his LA appendage with a 45 mm Atricure Clip.     In the past he has tried higher dose pravastatin 80 mg and had side effects including fatigue, weakness, muscle ache. He is able to tolerate 40 mg daily.   Carotid ultrasound reviewed with him from 01/14/2015 showing bilateral internal carotid occlusion, no significant change from ultrasound dated June 2015  Prior studies show cardioversion 02/25/2015 Transesophageal echo 08/11/2010 with PFO noted, ejection fraction 50%, left atrium moderately dilated, moderate MR, mild to moderate TR   PMH:   has a past medical history of Atrial flutter, paroxysmal ([place]), Carotid artery disease without cerebral infarction [facility]), Coronary artery disease (2011), Flatback syndrome of thoracolumbar region, H/O calcium pyrophosphate deposition disease (CPPD), History of TIA (transient ischemic attack), Hyperlipidemia, Paroxysmal A-fib ([place]), and Spinal stenosis of lumbar region without neurogenic claudication.  PSH:    Past Surgical History:  Procedure Laterality Date  . BILATERAL CARPAL TUNNEL RELEASE    . CARDIAC CATHETERIZATION    . CIRCUMCISION, NON-NEWBORN    . CLIPPING OF ATRIAL APPENDAGE N/A 09/18/2020   Procedure: CLIPPING OF ATRIAL APPENDAGE USING 68 ATRICURE LAA  EXCLUSION SYSTEM;  Surgeon: Prescott Gum, Collier Salina, MD;  Location: [place];  Service: Open Heart Surgery;  Laterality: N/A;  . CORONARY ANGIOPLASTY WITH STENT PLACEMENT  2011  . CORONARY ARTERY BYPASS GRAFT N/A 09/18/2020   Procedure: CORONARY ARTERY BYPASS GRAFTING (CABG)X 3, ON PUMP, USING LEFT INTERAL  MAMMARY ARTERY AND ENDOSCOPICALLY HARVESTED RIGHT GREATER SAPHENOUS VEIN. LIMA TO LAD, SVG TO OM, SVG TO PD;  Surgeon: Ivin Poot, MD;  Location: [place];  Service: Open Heart Surgery;  Laterality: N/A;  . ENDOVEIN HARVEST OF GREATER SAPHENOUS VEIN Right 09/18/2020   Procedure: ENDOVEIN HARVEST OF GREATER SAPHENOUS VEIN;  Surgeon: Ivin Poot, MD;  Location: [place];  Service: Open Heart Surgery;  Laterality: Right;  . LEFT HEART CATH AND CORONARY ANGIOGRAPHY Left 09/12/2020   Procedure: LEFT HEART CATH AND CORONARY ANGIOGRAPHY;  Surgeon: Minna Merritts, MD;  Location: [place] [facility];  Service: Cardiovascular;  Laterality: Left;  . TEE WITHOUT CARDIOVERSION N/A 09/18/2020   Procedure: TRANSESOPHAGEAL ECHOCARDIOGRAM (TEE);  Surgeon: Prescott Gum, Collier Salina, MD;  Location: [place];  Service: Open Heart Surgery;  Laterality: N/A;    Current Outpatient Medications  Medication Sig Dispense Refill  . aspirin EC 81 MG tablet Take 1 tablet (81 mg total) by mouth daily. 90 tablet 3  . carvedilol (COREG) 25 MG tablet Take 1 tablet (25 mg total) by mouth 2 (two) times daily with a meal. 60 tablet 3  . ELIQUIS 5 MG TABS tablet TAKE ONE TABLET TWICE DAILY 60 tablet 1  . ezetimibe (ZETIA) 10 MG tablet TAKE ONE (1) TABLET EACH DAY 30 tablet 3  . FLUoxetine (PROZAC) 40 MG capsule Take 1 capsule (40 mg total) by mouth at bedtime. 30 capsule 1  . gabapentin (NEURONTIN) 400 MG capsule Take 400 mg by mouth 3 (three) times daily.     . nitroGLYCERIN (NITROSTAT) 0.4 MG SL tablet Place 1 tablet (0.4 mg total) under the tongue every 5 (five) minutes as needed for chest pain. 25 tablet 3  . rosuvastatin (CRESTOR) 40 MG tablet Take 1 tablet (40 mg total) by mouth daily. 90 tablet 3  . traMADol (ULTRAM) 50 MG tablet Take 50-100 mg by mouth 2 (two) times daily as needed for moderate pain.    Marland Kitchen temazepam (RESTORIL) 15 MG capsule Take 1 capsule (15 mg total) by mouth at bedtime. 30 capsule 1   No current  facility-administered medications for this visit.     Allergies:   Propafenone, Codeine, and Penicillins   Social History:  The patient  reports that he has quit smoking. His smoking use included cigarettes. He has a 10.00 pack-year smoking history. He has never used smokeless tobacco. He reports current alcohol use. He reports that he does not use drugs.   Family History:   family history includes Heart disease (age of onset: 54) in his mother; Hypertension in his father.    Review of Systems: Review of Systems  Constitutional: Negative.   HENT: Negative.   Respiratory: Negative.   Cardiovascular: Negative.   Gastrointestinal: Negative.   Musculoskeletal: Negative.   Neurological: Negative.   All other systems reviewed and are negative.  PHYSICAL EXAM: VS:  BP 136/66 (BP Location: Right Arm, Patient Position: Sitting, Cuff Size: Normal)   Pulse 87   Ht 5\' 11"  (1.803 m)   Wt 229 lb (103.9 kg)   SpO2 96%   BMI 31.94 kg/m  , BMI Body mass index is 31.94 kg/m. Constitutional:  oriented to person, place, and time.  No distress.  HENT:  Head: Grossly normal Eyes:  no discharge. No scleral icterus.  Neck: No JVD, no carotid bruits  Cardiovascular: Regular rate and rhythm, no murmurs appreciated Pulmonary/Chest: Clear to auscultation bilaterally, no wheezes or rails Abdominal: Soft.  no distension.  no tenderness.  Musculoskeletal: Normal range of motion Neurological:  normal muscle tone. Coordination normal. No atrophy Skin: Skin warm and dry Psychiatric: normal affect, pleasant   Recent Labs: 09/16/2020: TSH 2.608 09/19/2020: Magnesium 1.8 11/10/2020: ALT 42; BUN 17; Creatinine, Ser 1.08; Hemoglobin 14.3; Platelets 196; Potassium 4.3; Sodium 135    Lipid Panel Lab Results  Component Value Date   CHOL 108 09/05/2020   HDL 44 09/05/2020   LDLCALC 48 09/05/2020   TRIG 76 09/05/2020      Wt Readings from Last 3 Encounters:  12/08/20 229 lb (103.9 kg)  12/03/20 231  lb (104.8 kg)  11/10/20 230 lb (104.3 kg)     ASSESSMENT AND PLAN:  Acute diastolic CHF (congestive heart failure) (HCC) - Lasix 40 daily, compression hose  Bi lower extremitylateral carotid artery stenosis - Plan: EKG 12-Lead Occluded carotids bilaterally -Cholesterol at goal  Coronary artery disease involving native coronary artery of native heart without angina pectoris - Recent CABG, Long discussion concerning need to participate in cardiac rehab, get walking, Work on sleep hygiene Working with primary care for depression  Chronic atrial fibrillation ([place]) - Plan: EKG 12-Lead On anticoagulation, rate well controlled Tolerating Eliquis.    Mixed hyperlipidemia - Plan: EKG 12-Lead  Crestor 40 mg daily with Zetia Cholesterol at goal  Morbid obesity As above discussed with him need for walking program, rehab    Total encounter time more than 25 minutes  Greater than 50% was spent in counseling and coordination of care with the patient    No orders of the defined types were placed in this encounter.    Signed, Esmond Plants, M.D., Ph.D. 12/08/2020  [place], [place]

## 2020-12-08 ENCOUNTER — Ambulatory Visit (INDEPENDENT_AMBULATORY_CARE_PROVIDER_SITE_OTHER): Payer: Medicare HMO | Admitting: Cardiovascular Disease

## 2020-12-08 ENCOUNTER — Other Ambulatory Visit: Payer: Self-pay

## 2020-12-08 ENCOUNTER — Encounter: Payer: Self-pay | Admitting: Cardiovascular Disease

## 2020-12-08 VITALS — BP 136/66 | HR 87 | Ht 71.0 in | Wt 229.0 lb

## 2020-12-08 DIAGNOSIS — I25110 Atherosclerotic heart disease of native coronary artery with unstable angina pectoris: Secondary | ICD-10-CM

## 2020-12-08 DIAGNOSIS — I739 Peripheral vascular disease, unspecified: Secondary | ICD-10-CM

## 2020-12-08 DIAGNOSIS — I6523 Occlusion and stenosis of bilateral carotid arteries: Secondary | ICD-10-CM

## 2020-12-08 DIAGNOSIS — I5032 Chronic diastolic (congestive) heart failure: Secondary | ICD-10-CM | POA: Diagnosis not present

## 2020-12-08 DIAGNOSIS — I482 Chronic atrial fibrillation, unspecified: Secondary | ICD-10-CM | POA: Diagnosis not present

## 2020-12-08 DIAGNOSIS — E785 Hyperlipidemia, unspecified: Secondary | ICD-10-CM

## 2020-12-08 NOTE — Patient Instructions (Signed)
Medication Instructions:  No changes  If you need a refill on your cardiac medications before your next appointment, please call your pharmacy.    Lab work: No new labs needed   If you have labs (blood work) drawn today and your tests are completely normal, you will receive your results only by: . MyChart Message (if you have MyChart) OR . A paper copy in the mail If you have any lab test that is abnormal or we need to change your treatment, we will call you to review the results.   Testing/Procedures: No new testing needed   Follow-Up: At [facility], you and your health needs are our priority.  As part of our continuing mission to provide you with exceptional heart care, we have created designated Provider Care Teams.  These Care Teams include your primary Cardiologist (physician) and Advanced Practice Providers (APPs -  Physician Assistants and Nurse Practitioners) who all work together to provide you with the care you need, when you need it.  . You will need a follow up appointment in 6 months  . Providers on your designated Care Team:   . Christopher Berge, NP . Ryan Dunn, PA-C . Jacquelyn Visser, PA-C  Any Other Special Instructions Will Be Listed Below (If Applicable).  COVID-19 Vaccine Information can be found at: [email or web address] For questions related to vaccine distribution or appointments, please email [email or web address] or call [phone].     

## 2020-12-10 ENCOUNTER — Encounter: Payer: Medicare HMO | Attending: Cardiovascular Disease

## 2020-12-10 ENCOUNTER — Other Ambulatory Visit: Payer: Self-pay

## 2020-12-10 DIAGNOSIS — Z951 Presence of aortocoronary bypass graft: Secondary | ICD-10-CM

## 2020-12-10 NOTE — Progress Notes (Signed)
Virtual Visit completed. Patient informed on EP and RD appointment and 6 Minute walk test. Patient also informed of patient health questionnaires on My Chart. Patient Verbalizes understanding. Visit diagnosis can be found in [facility] 09/12/2020.

## 2020-12-11 ENCOUNTER — Other Ambulatory Visit: Payer: Self-pay

## 2020-12-11 ENCOUNTER — Emergency Department: Payer: Medicare HMO

## 2020-12-11 ENCOUNTER — Encounter: Payer: Self-pay | Admitting: Emergency Medicine

## 2020-12-11 ENCOUNTER — Inpatient Hospital Stay
Admission: EM | Admit: 2020-12-11 | Discharge: 2020-12-16 | DRG: 917 | Disposition: A | Discharge status: Other Acute Inpatient Hospital | Payer: Medicare HMO | Attending: Internal Medicine | Admitting: Internal Medicine

## 2020-12-11 ENCOUNTER — Observation Stay: Payer: Medicare HMO

## 2020-12-11 DIAGNOSIS — I739 Peripheral vascular disease, unspecified: Secondary | ICD-10-CM | POA: Diagnosis present

## 2020-12-11 DIAGNOSIS — M4035 Flatback syndrome, thoracolumbar region: Secondary | ICD-10-CM | POA: Diagnosis present

## 2020-12-11 DIAGNOSIS — I4892 Unspecified atrial flutter: Secondary | ICD-10-CM | POA: Diagnosis present

## 2020-12-11 DIAGNOSIS — F32A Depression, unspecified: Secondary | ICD-10-CM | POA: Diagnosis present

## 2020-12-11 DIAGNOSIS — J189 Pneumonia, unspecified organism: Secondary | ICD-10-CM | POA: Diagnosis not present

## 2020-12-11 DIAGNOSIS — T1491XA Suicide attempt, initial encounter: Secondary | ICD-10-CM | POA: Diagnosis not present

## 2020-12-11 DIAGNOSIS — I110 Hypertensive heart disease with heart failure: Secondary | ICD-10-CM | POA: Diagnosis present

## 2020-12-11 DIAGNOSIS — T50902A Poisoning by unspecified drugs, medicaments and biological substances, intentional self-harm, initial encounter: Secondary | ICD-10-CM | POA: Diagnosis not present

## 2020-12-11 DIAGNOSIS — Z8249 Family history of ischemic heart disease and other diseases of the circulatory system: Secondary | ICD-10-CM

## 2020-12-11 DIAGNOSIS — Z7982 Long term (current) use of aspirin: Secondary | ICD-10-CM

## 2020-12-11 DIAGNOSIS — Z20822 Contact with and (suspected) exposure to covid-19: Secondary | ICD-10-CM | POA: Diagnosis present

## 2020-12-11 DIAGNOSIS — I10 Essential (primary) hypertension: Secondary | ICD-10-CM | POA: Diagnosis present

## 2020-12-11 DIAGNOSIS — R7401 Elevation of levels of liver transaminase levels: Secondary | ICD-10-CM | POA: Diagnosis present

## 2020-12-11 DIAGNOSIS — E785 Hyperlipidemia, unspecified: Secondary | ICD-10-CM | POA: Diagnosis present

## 2020-12-11 DIAGNOSIS — I482 Chronic atrial fibrillation, unspecified: Secondary | ICD-10-CM | POA: Diagnosis present

## 2020-12-11 DIAGNOSIS — J9601 Acute respiratory failure with hypoxia: Secondary | ICD-10-CM | POA: Diagnosis not present

## 2020-12-11 DIAGNOSIS — Z6831 Body mass index (BMI) 31.0-31.9, adult: Secondary | ICD-10-CM

## 2020-12-11 DIAGNOSIS — Z955 Presence of coronary angioplasty implant and graft: Secondary | ICD-10-CM

## 2020-12-11 DIAGNOSIS — T43222A Poisoning by selective serotonin reuptake inhibitors, intentional self-harm, initial encounter: Secondary | ICD-10-CM | POA: Diagnosis not present

## 2020-12-11 DIAGNOSIS — I480 Paroxysmal atrial fibrillation: Secondary | ICD-10-CM | POA: Diagnosis present

## 2020-12-11 DIAGNOSIS — G9341 Metabolic encephalopathy: Secondary | ICD-10-CM | POA: Diagnosis present

## 2020-12-11 DIAGNOSIS — I2510 Atherosclerotic heart disease of native coronary artery without angina pectoris: Secondary | ICD-10-CM | POA: Diagnosis present

## 2020-12-11 DIAGNOSIS — G8929 Other chronic pain: Secondary | ICD-10-CM | POA: Diagnosis present

## 2020-12-11 DIAGNOSIS — Z7901 Long term (current) use of anticoagulants: Secondary | ICD-10-CM

## 2020-12-11 DIAGNOSIS — I5032 Chronic diastolic (congestive) heart failure: Secondary | ICD-10-CM | POA: Diagnosis present

## 2020-12-11 DIAGNOSIS — F419 Anxiety disorder, unspecified: Secondary | ICD-10-CM | POA: Diagnosis present

## 2020-12-11 DIAGNOSIS — I252 Old myocardial infarction: Secondary | ICD-10-CM

## 2020-12-11 DIAGNOSIS — M48061 Spinal stenosis, lumbar region without neurogenic claudication: Secondary | ICD-10-CM

## 2020-12-11 DIAGNOSIS — F339 Major depressive disorder, recurrent, unspecified: Secondary | ICD-10-CM

## 2020-12-11 DIAGNOSIS — Z87891 Personal history of nicotine dependence: Secondary | ICD-10-CM

## 2020-12-11 DIAGNOSIS — F5104 Psychophysiologic insomnia: Secondary | ICD-10-CM | POA: Diagnosis present

## 2020-12-11 DIAGNOSIS — E669 Obesity, unspecified: Secondary | ICD-10-CM | POA: Diagnosis present

## 2020-12-11 DIAGNOSIS — Z818 Family history of other mental and behavioral disorders: Secondary | ICD-10-CM

## 2020-12-11 DIAGNOSIS — M549 Dorsalgia, unspecified: Secondary | ICD-10-CM | POA: Diagnosis present

## 2020-12-11 DIAGNOSIS — Z79899 Other long term (current) drug therapy: Secondary | ICD-10-CM

## 2020-12-11 DIAGNOSIS — T43022A Poisoning by tetracyclic antidepressants, intentional self-harm, initial encounter: Secondary | ICD-10-CM | POA: Diagnosis present

## 2020-12-11 DIAGNOSIS — Z8673 Personal history of transient ischemic attack (TIA), and cerebral infarction without residual deficits: Secondary | ICD-10-CM

## 2020-12-11 DIAGNOSIS — J9621 Acute and chronic respiratory failure with hypoxia: Secondary | ICD-10-CM | POA: Diagnosis present

## 2020-12-11 DIAGNOSIS — T50901A Poisoning by unspecified drugs, medicaments and biological substances, accidental (unintentional), initial encounter: Secondary | ICD-10-CM | POA: Diagnosis present

## 2020-12-11 DIAGNOSIS — Z951 Presence of aortocoronary bypass graft: Secondary | ICD-10-CM

## 2020-12-11 DIAGNOSIS — G2579 Other drug induced movement disorders: Secondary | ICD-10-CM | POA: Diagnosis present

## 2020-12-11 DIAGNOSIS — R0602 Shortness of breath: Secondary | ICD-10-CM

## 2020-12-11 LAB — COMPREHENSIVE METABOLIC PANEL
ALT: 31 U/L (ref 0–44)
AST: 26 U/L (ref 15–41)
Albumin: 4.1 g/dL (ref 3.5–5.0)
Alkaline Phosphatase: 57 U/L (ref 38–126)
Anion gap: 11 (ref 5–15)
BUN: 16 mg/dL (ref 8–23)
CO2: 24 mmol/L (ref 22–32)
Calcium: 9.6 mg/dL (ref 8.9–10.3)
Chloride: 102 mmol/L (ref 98–111)
Creatinine, Ser: 0.94 mg/dL (ref 0.61–1.24)
GFR, Estimated: >60 mL/min (ref >60)
Glucose, Bld: 157 mg/dL — ABNORMAL HIGH (ref 70–99)
Potassium: 4 mmol/L (ref 3.5–5.1)
Sodium: 137 mmol/L (ref 135–145)
Total Bilirubin: 1.1 mg/dL (ref 0.3–1.2)
Total Protein: 7.1 g/dL (ref 6.5–8.1)

## 2020-12-11 LAB — CBC WITH DIFFERENTIAL/PLATELET
Abs Immature Granulocytes: 0.02 10*3/uL (ref 0.00–0.07)
Basophils Absolute: 0 10*3/uL (ref 0.0–0.1)
Basophils Relative: 1 %
Eosinophils Absolute: 0.3 10*3/uL (ref 0.0–0.5)
Eosinophils Relative: 4 %
HCT: 45.7 % (ref 39.0–52.0)
Hemoglobin: 14.8 g/dL (ref 13.0–17.0)
Immature Granulocytes: 0 %
Lymphocytes Relative: 17 %
Lymphs Abs: 1.4 10*3/uL (ref 0.7–4.0)
MCH: 31.7 pg (ref 26.0–34.0)
MCHC: 32.4 g/dL (ref 30.0–36.0)
MCV: 97.9 fL (ref 80.0–100.0)
Monocytes Absolute: 0.5 10*3/uL (ref 0.1–1.0)
Monocytes Relative: 6 %
Neutro Abs: 6.2 10*3/uL (ref 1.7–7.7)
Neutrophils Relative %: 72 %
Platelets: 202 10*3/uL (ref 150–400)
RBC: 4.67 MIL/uL (ref 4.22–5.81)
RDW: 13.1 % (ref 11.5–15.5)
WBC: 8.5 10*3/uL (ref 4.0–10.5)
nRBC: 0 % (ref 0.0–0.2)

## 2020-12-11 LAB — MAGNESIUM: Magnesium: 2.3 mg/dL (ref 1.7–2.4)

## 2020-12-11 LAB — URINE DRUG SCREEN, QUALITATIVE (ARMC ONLY)
Amphetamines, Ur Screen: NOT DETECTED
Barbiturates, Ur Screen: NOT DETECTED
Benzodiazepine, Ur Scrn: POSITIVE — AB
Cannabinoid 50 Ng, Ur ~~LOC~~: NOT DETECTED
Cocaine Metabolite,Ur ~~LOC~~: NOT DETECTED
MDMA (Ecstasy)Ur Screen: NOT DETECTED
Methadone Scn, Ur: NOT DETECTED
Opiate, Ur Screen: NOT DETECTED
Phencyclidine (PCP) Ur S: POSITIVE — AB
Tricyclic, Ur Screen: NOT DETECTED

## 2020-12-11 LAB — ETHANOL: Alcohol, Ethyl (B): 16 mg/dL — ABNORMAL HIGH (ref <10)

## 2020-12-11 LAB — ACETAMINOPHEN LEVEL
Acetaminophen (Tylenol), Serum: <10 ug/mL — ABNORMAL LOW (ref 10–30)
Acetaminophen (Tylenol), Serum: <10 ug/mL — ABNORMAL LOW (ref 10–30)

## 2020-12-11 LAB — RESP PANEL BY RT-PCR (FLU A&B, COVID) ARPGX2
Influenza A by PCR: NEGATIVE
Influenza B by PCR: NEGATIVE
SARS Coronavirus 2 by RT PCR: NEGATIVE

## 2020-12-11 LAB — SALICYLATE LEVEL: Salicylate Lvl: <7 mg/dL — ABNORMAL LOW (ref 7.0–30.0)

## 2020-12-11 LAB — CBG MONITORING, ED: Glucose-Capillary: 163 mg/dL — ABNORMAL HIGH (ref 70–99)

## 2020-12-11 LAB — PROCALCITONIN: Procalcitonin: <0.1 ng/mL

## 2020-12-11 LAB — BRAIN NATRIURETIC PEPTIDE: B Natriuretic Peptide: 150.9 pg/mL — ABNORMAL HIGH (ref 0.0–100.0)

## 2020-12-11 MED ORDER — FUROSEMIDE 40 MG PO TABS
40.0000 mg | ORAL_TABLET | Freq: Every day | ORAL | Status: DC
  Administered 2020-12-12: 40 mg via ORAL
  Filled 2020-12-11 (×2): qty 1

## 2020-12-11 MED ORDER — EZETIMIBE 10 MG PO TABS
10.0000 mg | ORAL_TABLET | Freq: Every day | ORAL | Status: DC
  Administered 2020-12-12 – 2020-12-16 (×5): 10 mg via ORAL
  Filled 2020-12-11 (×7): qty 1

## 2020-12-11 MED ORDER — ENSURE ENLIVE PO LIQD
237.0000 mL | Freq: Two times a day (BID) | ORAL | Status: DC
  Administered 2020-12-13 – 2020-12-16 (×6): 237 mL via ORAL

## 2020-12-11 MED ORDER — ACETAMINOPHEN 325 MG PO TABS: 650.0000 mg | ORAL_TABLET | Freq: Four times a day (QID) | ORAL | Status: DC | PRN

## 2020-12-11 MED ORDER — CARVEDILOL 25 MG PO TABS
25.0000 mg | ORAL_TABLET | Freq: Two times a day (BID) | ORAL | Status: DC
  Administered 2020-12-11 – 2020-12-16 (×11): 25 mg via ORAL
  Filled 2020-12-11 (×11): qty 1

## 2020-12-11 MED ORDER — ROSUVASTATIN CALCIUM 10 MG PO TABS
40.0000 mg | ORAL_TABLET | Freq: Every day | ORAL | Status: DC
Start: 2020-12-11 — End: 2020-12-16
  Administered 2020-12-11 – 2020-12-16 (×6): 40 mg via ORAL
  Filled 2020-12-11: qty 2
  Filled 2020-12-11 (×3): qty 4
  Filled 2020-12-11 (×2): qty 2
  Filled 2020-12-11: qty 4

## 2020-12-11 MED ORDER — LORAZEPAM 1 MG PO TABS
1.0000 mg | ORAL_TABLET | Freq: Four times a day (QID) | ORAL | Status: DC | PRN
  Administered 2020-12-13 – 2020-12-15 (×2): 1 mg via ORAL
  Filled 2020-12-11 (×3): qty 1

## 2020-12-11 MED ORDER — SODIUM CHLORIDE 0.9 % IV SOLN
500.0000 mg | Freq: Once | INTRAVENOUS | Status: AC
  Administered 2020-12-11: 500 mg via INTRAVENOUS
  Filled 2020-12-11: qty 500

## 2020-12-11 MED ORDER — ALBUTEROL SULFATE HFA 108 (90 BASE) MCG/ACT IN AERS
2.0000 | INHALATION_SPRAY | RESPIRATORY_TRACT | Status: DC | PRN
  Filled 2020-12-11: qty 6.7

## 2020-12-11 MED ORDER — FUROSEMIDE 40 MG PO TABS: 20.0000 mg | ORAL_TABLET | Freq: Every day | ORAL | Status: DC

## 2020-12-11 MED ORDER — ASPIRIN EC 81 MG PO TBEC
81.0000 mg | DELAYED_RELEASE_TABLET | Freq: Every day | ORAL | Status: DC
  Administered 2020-12-11 – 2020-12-16 (×6): 81 mg via ORAL
  Filled 2020-12-11 (×6): qty 1

## 2020-12-11 MED ORDER — OXYCODONE-ACETAMINOPHEN 5-325 MG PO TABS
1.0000 | ORAL_TABLET | Freq: Once | ORAL | Status: AC
  Administered 2020-12-11: 1 via ORAL
  Filled 2020-12-11: qty 1

## 2020-12-11 MED ORDER — NITROGLYCERIN 0.4 MG SL SUBL
0.4000 mg | SUBLINGUAL_TABLET | SUBLINGUAL | Status: DC | PRN
Start: 2020-12-11 — End: 2020-12-16

## 2020-12-11 MED ORDER — DEXAMETHASONE SODIUM PHOSPHATE 10 MG/ML IJ SOLN
10.0000 mg | Freq: Once | INTRAMUSCULAR | Status: AC
  Administered 2020-12-11: 10 mg via INTRAVENOUS
  Filled 2020-12-11: qty 1

## 2020-12-11 MED ORDER — PROMETHAZINE HCL 25 MG/ML IJ SOLN: 12.5000 mg | Freq: Three times a day (TID) | INTRAMUSCULAR | Status: DC | PRN

## 2020-12-11 MED ORDER — ONDANSETRON 4 MG PO TBDP
4.0000 mg | ORAL_TABLET | Freq: Once | ORAL | Status: AC
  Administered 2020-12-11: 4 mg via ORAL
  Filled 2020-12-11: qty 1

## 2020-12-11 MED ORDER — ALPRAZOLAM 0.5 MG PO TABS
1.0000 mg | ORAL_TABLET | ORAL | Status: AC
  Filled 2020-12-11: qty 2

## 2020-12-11 MED ORDER — ONDANSETRON HCL 4 MG PO TABS: 4.0000 mg | ORAL_TABLET | Freq: Four times a day (QID) | ORAL | Status: DC | PRN

## 2020-12-11 MED ORDER — SODIUM CHLORIDE 0.9 % IV SOLN
1.0000 g | INTRAVENOUS | Status: DC
  Administered 2020-12-12: 1 g via INTRAVENOUS
  Filled 2020-12-11: qty 10

## 2020-12-11 MED ORDER — LIDOCAINE 5 % EX PTCH
1.0000 | MEDICATED_PATCH | CUTANEOUS | Status: DC
  Administered 2020-12-11 – 2020-12-14 (×3): 1 via TRANSDERMAL
  Filled 2020-12-11 (×6): qty 1

## 2020-12-11 MED ORDER — SODIUM CHLORIDE 0.9 % IV SOLN
500.0000 mg | INTRAVENOUS | Status: DC
  Administered 2020-12-12: 500 mg via INTRAVENOUS
  Filled 2020-12-11: qty 500

## 2020-12-11 MED ORDER — CYPROHEPTADINE HCL 4 MG PO TABS
4.0000 mg | ORAL_TABLET | Freq: Two times a day (BID) | ORAL | Status: AC
  Administered 2020-12-11 – 2020-12-13 (×3): 4 mg via ORAL
  Filled 2020-12-11 (×8): qty 1

## 2020-12-11 MED ORDER — APIXABAN 5 MG PO TABS
5.0000 mg | ORAL_TABLET | Freq: Two times a day (BID) | ORAL | Status: DC
  Administered 2020-12-11 – 2020-12-16 (×10): 5 mg via ORAL
  Filled 2020-12-11 (×11): qty 1

## 2020-12-11 MED ORDER — GUAIFENESIN-DM 100-10 MG/5ML PO SYRP
5.0000 mL | ORAL_SOLUTION | ORAL | Status: DC | PRN
  Administered 2020-12-11 – 2020-12-12 (×2): 5 mL via ORAL
  Filled 2020-12-11 (×2): qty 5

## 2020-12-11 MED ORDER — HYDRALAZINE HCL 20 MG/ML IJ SOLN: 5.0000 mg | INTRAMUSCULAR | Status: DC | PRN

## 2020-12-11 MED ORDER — DM-GUAIFENESIN ER 30-600 MG PO TB12: 1.0000 | ORAL_TABLET | Freq: Two times a day (BID) | ORAL | Status: DC | PRN

## 2020-12-11 MED ORDER — SODIUM CHLORIDE 0.9 % IV SOLN
1.0000 g | Freq: Once | INTRAVENOUS | Status: AC
  Administered 2020-12-11: 1 g via INTRAVENOUS
  Filled 2020-12-11: qty 10

## 2020-12-11 NOTE — ED Triage Notes (Signed)
Pt to ED via EMS from home c/o suicide attempt tonight.  States took approx 24 40mg  prozac and 15 15mg  remeron around 0430 with intention to harm self.  Drank a beer tonight, denies other drug use.  States chronic back pain, feels groggy and "drunk like".  Pt hooked up to monitors, placed in hospital gown while being observed medically.  Other clothes and belongings removed and placed into bag which include: 1 red jacket, 1 grey sweatshirt, 1 pair white socks, 1 pair blue slip on shoes, 1 pair camo pants, 1 black cane, 1 yellow watch.  Poison control being contact by EDP.

## 2020-12-11 NOTE — ED Provider Notes (Signed)
-----------------------------------------   7:00 AM on 12/11/2020 -----------------------------------------  Blood pressure (!) 199/104, pulse 83, temperature 97.6 F (36.4 C), temperature source Oral, resp. rate (!) 24, height 5\' 11"  (1.803 m), weight 103.9 kg, SpO2 97 %.  Assuming care from Dr. Leonides Schanz.  In short, Jeremy Reese is a 70 y.o. male with a chief complaint of Suicidal .  Refer to the original H&P for additional details.  The current plan of care is to follow-up repeat tylenol level and EKG, patient may be medically cleared after 8 hours observation.  ----------------------------------------- 11:12 AM on 12/11/2020 -----------------------------------------  On reassessment, patient noted to be hypoxic with O2 sats in the mid 80s, which improved to approximately 91% on 2 L nasal cannula.  COVID-19 testing is negative but chest x-ray reviewed by me and shows multifocal infiltrates concerning for atypical pneumonia.  Given negative COVID testing, we will cover for community-acquired pneumonia with Rocephin and azithromycin.  Patient additionally feels very jittery and is complaining of lower back pain.  The low back pain appears to be a chronic issue for him, but he is now reporting difficulty moving his legs, denies any urinary retention or incontinence.  We will rule out cauda equina with lumbar spine MRI.  There is also likely an element of serotonin syndrome with his tremors and jitters.  Patient evaluated by Dr. Weber Cooks of psychiatry, who agrees with element of serotonin syndrome and will treat with Ativan.  Case discussed with hospitalist for admission given his hypoxic respiratory failure.    Blake Divine, MD 12/11/20 1114

## 2020-12-11 NOTE — ED Provider Notes (Signed)
[facility] Emergency Department Provider Note   ____________________________________________   Event Date/Time   First MD Initiated Contact with Patient 12/11/20 [phone]     (approximate)  I have reviewed the triage vital signs and the nursing notes.   HISTORY  Chief Complaint Suicidal    HPI Jeremy Reese is a 70 y.o. male with history of atrial fibrillation on Eliquis, CAD status post CABG, hypertension, hyperlipidemia, TIA, spinal stenosis of the lumbar region who presents to the emergency department after he intentionally took 25 Prozac 40 mg tablets and 15 Remeron 15mg  tablets at 4:30 AM in an attempt to kill himself.  He states he did drink 1 beer as well.  No other coingestions.  No illicit drug use.  States he feels "drunk" currently.  He denies headache, nausea or vomiting, abdominal pain, chest pain or shortness of breath.  Has chronic back pain which is unchanged.  Patient comes in by EMS.    Past Medical History:  Diagnosis Date  . Atrial flutter, paroxysmal ([place])   . Carotid artery disease without cerebral infarction ([place])   . Coronary artery disease 2011  . Flatback syndrome of thoracolumbar region   . H/O calcium pyrophosphate deposition disease (CPPD)   . History of TIA (transient ischemic attack)   . Hyperlipidemia   . Paroxysmal A-fib ([place])   . Spinal stenosis of lumbar region without neurogenic claudication     Patient Active Problem List   Diagnosis Date Noted  . Postoperative depression 12/03/2020  . Severe recurrent major depression without psychotic features ([place]) 11/11/2020  . Postop check 10/29/2020  . S/P CABG x 3 09/18/2020  . Chest pain   . Unstable angina ([place]) 09/12/2020  . PAD (peripheral artery disease) ([place]) 09/12/2020  . CAD, multiple vessel 09/12/2020  . 3-vessel CAD 07/25/2017  . Chronic diastolic congestive heart failure ([place]) 04/29/2015  . Hyperlipidemia 04/01/2015  . Lumbago 04/01/2015  . Carotid  stenosis 04/01/2015  . Chronic atrial fibrillation ([place]) 04/01/2015  . H/O calcium pyrophosphate deposition disease (CPPD) 03/27/2015  . Hypertension 03/27/2015  . History of TIA (transient ischemic attack) 03/27/2015  . Osteoarthritis 03/27/2015  . Flatback syndrome of thoracolumbar region 06/05/2014  . Spinal stenosis of lumbar region without neurogenic claudication 06/05/2014    Past Surgical History:  Procedure Laterality Date  . BILATERAL CARPAL TUNNEL RELEASE    . CARDIAC CATHETERIZATION    . CIRCUMCISION, NON-NEWBORN    . CLIPPING OF ATRIAL APPENDAGE N/A 09/18/2020   Procedure: CLIPPING OF ATRIAL APPENDAGE USING 82 ATRICURE LAA  EXCLUSION SYSTEM;  Surgeon: Ivin Poot, MD;  Location: [place];  Service: Open Heart Surgery;  Laterality: N/A;  . CORONARY ANGIOPLASTY WITH STENT PLACEMENT  2011  . CORONARY ARTERY BYPASS GRAFT N/A 09/18/2020   Procedure: CORONARY ARTERY BYPASS GRAFTING (CABG)X 3, ON PUMP, USING LEFT INTERAL MAMMARY ARTERY AND ENDOSCOPICALLY HARVESTED RIGHT GREATER SAPHENOUS VEIN. LIMA TO LAD, SVG TO OM, SVG TO PD;  Surgeon: Ivin Poot, MD;  Location: [place];  Service: Open Heart Surgery;  Laterality: N/A;  . ENDOVEIN HARVEST OF GREATER SAPHENOUS VEIN Right 09/18/2020   Procedure: ENDOVEIN HARVEST OF GREATER SAPHENOUS VEIN;  Surgeon: Ivin Poot, MD;  Location: [place];  Service: Open Heart Surgery;  Laterality: Right;  . LEFT HEART CATH AND CORONARY ANGIOGRAPHY Left 09/12/2020   Procedure: LEFT HEART CATH AND CORONARY ANGIOGRAPHY;  Surgeon: Minna Merritts, MD;  Location: [place] [facility];  Service: Cardiovascular;  Laterality: Left;  .  TEE WITHOUT CARDIOVERSION N/A 09/18/2020   Procedure: TRANSESOPHAGEAL ECHOCARDIOGRAM (TEE);  Surgeon: Prescott Gum, Collier Salina, MD;  Location: [place];  Service: Open Heart Surgery;  Laterality: N/A;    Prior to Admission medications   Medication Sig Start Date End Date Taking? Authorizing Provider  aspirin EC 81 MG tablet Take 1  tablet (81 mg total) by mouth daily. 12/05/18   Minna Merritts, MD  carvedilol (COREG) 25 MG tablet Take 1 tablet (25 mg total) by mouth 2 (two) times daily with a meal. 09/23/20   Barrett, Erin R, PA-C  ELIQUIS 5 MG TABS tablet TAKE ONE TABLET TWICE DAILY 05/01/18   Minna Merritts, MD  ezetimibe (ZETIA) 10 MG tablet TAKE ONE (1) TABLET EACH DAY 02/17/18   Minna Merritts, MD  FLUoxetine (PROZAC) 40 MG capsule Take 1 capsule (40 mg total) by mouth at bedtime. 11/14/20   [name], MD  gabapentin (NEURONTIN) 400 MG capsule Take 400 mg by mouth 3 (three) times daily.     [provider]  mirtazapine (REMERON) 15 MG tablet Take 1 tablet (15 mg total) by mouth at bedtime. 12/08/20   Minna Merritts, MD  nitroGLYCERIN (NITROSTAT) 0.4 MG SL tablet Place 1 tablet (0.4 mg total) under the tongue every 5 (five) minutes as needed for chest pain. 09/05/20   Loel Dubonnet, NP  rosuvastatin (CRESTOR) 40 MG tablet Take 1 tablet (40 mg total) by mouth daily. 09/05/20   Loel Dubonnet, NP  temazepam (RESTORIL) 15 MG capsule Take 1 capsule (15 mg total) by mouth at bedtime. 11/14/20   [name], MD  traMADol (ULTRAM) 50 MG tablet Take 50-100 mg by mouth 2 (two) times daily as needed for moderate pain.    [provider]    Allergies Propafenone, Codeine, and Penicillins  Family History  Problem Relation Age of Onset  . Heart disease Mother 46       CABG x 3   . Hypertension Father     Social History Social History   Tobacco Use  . Smoking status: Former Smoker    Packs/day: 0.25    Years: 35.00    Pack years: 8.75    Types: Cigarettes    Quit date: 11/29/1993    Years since quitting: 27.0  . Smokeless tobacco: Never Used  Vaping Use  . Vaping Use: Never used  Substance Use Topics  . Alcohol use: Yes    Comment: rare   . Drug use: No    Review of Systems  Constitutional: No fever/chills Eyes: No visual changes. ENT: No sore throat. Cardiovascular:  Denies chest pain. Respiratory: Denies shortness of breath. Gastrointestinal: No abdominal pain.  No nausea, no vomiting.  No diarrhea.  No constipation. Genitourinary: Negative for dysuria. Musculoskeletal: Negative for back pain. Skin: Negative for rash. Neurological: Negative for headaches, focal weakness or numbness. Psychiatric:  + SI  ____________________________________________   PHYSICAL EXAM:  VITAL SIGNS: ED Triage Vitals  Enc Vitals Group     BP      Pulse      Resp      Temp      Temp src      SpO2      Weight      Height      Head Circumference      Peak Flow      Pain Score      Pain Loc      Pain Edu?  Excl. in [place]?     Constitutional: Alert and oriented. Well appearing and in no acute distress. Eyes: Conjunctivae are normal. PERRL. EOMI. pupils approximately 4 mm bilaterally. Head: Atraumatic. Nose: No congestion/rhinnorhea. Mouth/Throat: Mucous membranes are moist.  Oropharynx non-erythematous. Neck: No stridor.   Cardiovascular: Normal rate, irregular rhythm. Grossly normal heart sounds.  Good peripheral circulation. Respiratory: Normal respiratory effort.  No retractions. Lungs CTAB. Gastrointestinal: Soft and nontender. No distention. No abdominal bruits. No CVA tenderness. Musculoskeletal: No lower extremity tenderness nor edema.  No joint effusions. Neurologic:  Normal speech and language. No gross focal neurologic deficits are appreciated.  No clonus.  No hyperreflexia. Skin:  Skin is warm, dry and intact. No rash noted. Psychiatric: Mood and affect are normal. Speech and behavior are normal.  ____________________________________________   LABS (all labs ordered are listed, but only abnormal results are displayed)  Labs Reviewed  COMPREHENSIVE METABOLIC PANEL  SALICYLATE LEVEL  ACETAMINOPHEN LEVEL  ETHANOL  URINE DRUG SCREEN, QUALITATIVE ([facility] ONLY)  CBC WITH DIFFERENTIAL/PLATELET  CBG MONITORING, ED    ____________________________________________  EKG   Date: 12/11/2020 6:30 AM  Rate: 79  Rhythm: Atrial fibrillation  QRS Axis: normal  Intervals: normal  ST/T Wave abnormalities: Inferior T wave abnormalities  Conduction Disutrbances: none  Narrative Interpretation: Inferior T wave abnormalities, A. fib, rate controlled, no change compared to previous EKG    ____________________________________________  RADIOLOGY  ED MD interpretation: None  Official radiology report(s): No results found.  ____________________________________________   PROCEDURES  Procedure(s) performed: None  Procedures  Critical Care performed: Yes, see critical care note(s)   CRITICAL CARE Performed by: Pryor Curia   Total critical care time: 45 minutes  Critical care time was exclusive of separately billable procedures and treating other patients.  Critical care was necessary to treat or prevent imminent or life-threatening deterioration.  Critical care was time spent personally by me on the following activities: development of treatment plan with patient and/or surrogate as well as nursing, discussions with consultants, evaluation of patient's response to treatment, examination of patient, obtaining history from patient or surrogate, ordering and performing treatments and interventions, ordering and review of laboratory studies, ordering and review of radiographic studies, pulse oximetry and re-evaluation of patient's condition.   ____________________________________________   INITIAL IMPRESSION / ASSESSMENT AND PLAN / ED COURSE  As part of my medical decision making, I reviewed the following data within the [place] notes reviewed and incorporated, EKG interpreted atrial fibrillation, Old chart reviewed and Notes from prior ED visits     Patient here after intentional overdose.  No sign of serotonin syndrome at this time.  EKG shows QTc interval of 427 and  QRS of 95 ms.  We will check labs including Tylenol and salicylate level.  We will check electrolytes.  We will continue to monitor closely in the ED.  Patient is currently here voluntarily.   6:20 AM  Spoke with Lennette Bihari at poison control.  Watch for CNS depression with Remeron.  Monitor QRS and QTc for prolongation.  Replace potassium and magnesium to high level of normal.  Obs 8 hours and repeat EKG at that time.  Recommends repeat tylenol level at 4 hours post ingestion.  7:00 AM  Signed out to oncoming EDP to follow up on labs, repeat Tylenol level at 8:30am and repeat EKG at 12:30pm.  If doing well at 12:30 pm, patient is medically cleared and psych can be consulted.  If patient were to try to leave the  ED then I would recommend IVC at that time.  Home medications will need to be re-ordered once medically cleared.  I reviewed all nursing notes and pertinent previous records as available.  I have reviewed and interpreted any EKGs, lab and urine results, imaging (as available).  ____________________________________________   FINAL CLINICAL IMPRESSION(S) / ED DIAGNOSES  Final diagnoses:  Intentional drug overdose, initial encounter [facility])     ED Discharge Orders    None       Note:  This document was prepared using Dragon voice recognition software and may include unintentional dictation errors.    Kerston Landeck, Delice Bison, DO 12/11/20 [phone]

## 2020-12-11 NOTE — ED Notes (Signed)
Pt still refuses to have an MRI.

## 2020-12-11 NOTE — Consult Note (Signed)
Addendum to previous note: I noticed that the patient had an order for as needed ondansetron.  This medicine can cause or worsen serotonin syndrome so I would recommend against using it and have discontinued it.  If he needs medical treatment for nausea and vomiting I would recommend Phenergan instead.

## 2020-12-11 NOTE — ED Notes (Signed)
Pt is refusing to go to MRI at this time. EDP made aware.

## 2020-12-11 NOTE — Consult Note (Signed)
[name] Consult   Reason for Consult: Consult for 70 year old man brought into the hospital after a suicide attempt Referring Physician: Shelbie Ammons Patient Identification: Jeremy Reese MRN:  [id number] Principal Diagnosis: Suicide attempt [facility]) Diagnosis:  Principal Problem:   Suicide attempt [facility]) Active Problems:   Hyperlipidemia   Chronic atrial fibrillation ([place])   Chronic diastolic congestive heart failure ([place])   CAD, multiple vessel   Hypertension   History of TIA (transient ischemic attack)   Spinal stenosis of lumbar region without neurogenic claudication   Multifocal pneumonia   Depression   Acute respiratory failure with hypoxia ([place])   Total Time spent with patient: 1 hour  Subjective:   Jeremy Reese is a 70 y.o. male patient admitted with "I took an overdose".  HPI: Patient seen chart reviewed.  70 year old man with a history of depression brought to the hospital this morning early after taking an overdose of his antidepressant medicine.  Patient estimates around 5:00 AM he took a large number of fluoxetine as well as mirtazapine tablets.  He says he did this intentionally as a suicide attempt.  It is not clear whether he immediately informed his wife or not but the wife did become aware and called 911.  Patient reports that his mood has continued to be depressed since his discharge from the psychiatric ward.  He feels emotionally overwhelmed.  Feels like his stresses are overwhelming.  Sleeping poorly at night.  Energy level very low.  Not working on doing the cardiac reconditioning that he is supposed to be doing.  Having suicidal thoughts.  Denies hallucinations or psychotic symptoms.  Denies alcohol or drug abuse.  On evaluation today the patient appeared more stiff and alert and startled looking than I usually expect someone to be in his circumstance.  He had a tremor all over including in his face and jaw.  His blood pressure is running abnormally  high.  He also seems somewhat confused.  Not frankly delirious but made several errors on simple things in our conversation that he had to correct himself on.  Also he is not maintaining his oxygen saturation well despite being on supplemental O2.  Past Psychiatric History: Patient has a history of major depression.  He was an inpatient on our psychiatric ward as recently as December and has had other hospitalizations  prior to that.  He does have a past history of suicide attempts.  No known history of mania or bipolar disorder.  No substance abuse identified.  Patient suffered a myocardial infarction and has had some other complications of arterial disease recently and feels very depressed and overwhelmed about them.  Risk to Self:   Risk to Others:   Prior Inpatient Therapy:   Prior Outpatient Therapy:    Past Medical History:  Past Medical History:  Diagnosis Date  . Atrial flutter, paroxysmal ([place])   . Carotid artery disease without cerebral infarction ([place])   . Coronary artery disease 2011  . Flatback syndrome of thoracolumbar region   . H/O calcium pyrophosphate deposition disease (CPPD)   . History of TIA (transient ischemic attack)   . Hyperlipidemia   . Paroxysmal A-fib ([place])   . Spinal stenosis of lumbar region without neurogenic claudication     Past Surgical History:  Procedure Laterality Date  . BILATERAL CARPAL TUNNEL RELEASE    . CARDIAC CATHETERIZATION    . CIRCUMCISION, NON-NEWBORN    . CLIPPING OF ATRIAL APPENDAGE N/A 09/18/2020   Procedure: CLIPPING OF ATRIAL  [place];  Surgeon: Ivin Poot, MD;  Location: [facility];  Service: Open Heart Surgery;  Laterality: N/A;  . CORONARY ANGIOPLASTY WITH STENT PLACEMENT  2011  . CORONARY ARTERY BYPASS GRAFT N/A 09/18/2020   Procedure: CORONARY ARTERY BYPASS GRAFTING (CABG)X 3, ON PUMP, USING LEFT INTERAL MAMMARY ARTERY AND ENDOSCOPICALLY HARVESTED RIGHT GREATER SAPHENOUS VEIN. LIMA TO  LAD, SVG TO OM, SVG TO PD;  Surgeon: Ivin Poot, MD;  Location: [facility];  Service: Open Heart Surgery;  Laterality: N/A;  . ENDOVEIN HARVEST OF GREATER SAPHENOUS VEIN Right 09/18/2020   Procedure: ENDOVEIN HARVEST OF GREATER SAPHENOUS VEIN;  Surgeon: Ivin Poot, MD;  Location: [facility];  Service: Open Heart Surgery;  Laterality: Right;  . LEFT HEART CATH AND CORONARY ANGIOGRAPHY Left 09/12/2020   Procedure: LEFT HEART CATH AND CORONARY ANGIOGRAPHY;  Surgeon: Minna Merritts, MD;  Location: [facility];  Service: Cardiovascular;  Laterality: Left;  . TEE WITHOUT CARDIOVERSION N/A 09/18/2020   Procedure: TRANSESOPHAGEAL ECHOCARDIOGRAM (TEE);  Surgeon: Prescott Gum, Collier Salina, MD;  Location: [facility];  Service: Open Heart Surgery;  Laterality: N/A;   Family History:  Family History  Problem Relation Age of Onset  . Heart disease Mother 20       CABG x 3   . Hypertension Father    Family Psychiatric  History: None reported Social History:  Social History   Substance and Sexual Activity  Alcohol Use Yes   Comment: rare      Social History   Substance and Sexual Activity  Drug Use No    Social History   Socioeconomic History  . Marital status: Married    Spouse name: Not on file  . Number of children: Not on file  . Years of education: Not on file  . Highest education level: Not on file  Occupational History  . Not on file  Tobacco Use  . Smoking status: Former Smoker    Packs/day: 0.25    Years: 35.00    Pack years: 8.75    Types: Cigarettes    Quit date: 11/29/1993    Years since quitting: 27.0  . Smokeless tobacco: Never Used  Vaping Use  . Vaping Use: Never used  Substance and Sexual Activity  . Alcohol use: Yes    Comment: rare   . Drug use: No  . Sexual activity: Not on file  Other Topics Concern  . Not on file  Social History Narrative  . Not on file   Social Determinants of Health   Financial Resource Strain: Not on file  Food Insecurity: Not on  file  Transportation Needs: Not on file  Physical Activity: Not on file  Stress: Not on file  Social Connections: Not on file   Additional Social History:    Allergies:   Allergies  Allergen Reactions  . Propafenone Anxiety  . Codeine Nausea Only  . Penicillins Rash    Labs:  Results for orders placed or performed during the hospital encounter of 12/11/20 (from the past 48 hour(s))  CBG monitoring, ED     Status: Abnormal   Collection Time: 12/11/20  6:31 AM  Result Value Ref Range   Glucose-Capillary 163 (H) 70 - 99 mg/dL    Comment: Glucose reference range applies only to samples taken after fasting for at least 8 hours.  Comprehensive metabolic panel     Status: Abnormal   Collection Time: 12/11/20  6:32 AM  Result  Value Ref Range   Sodium 137 135 - 145 mmol/L   Potassium 4.0 3.5 - 5.1 mmol/L   Chloride 102 98 - 111 mmol/L   CO2 24 22 - 32 mmol/L   Glucose, Bld 157 (H) 70 - 99 mg/dL    Comment: Glucose reference range applies only to samples taken after fasting for at least 8 hours.   BUN 16 8 - 23 mg/dL   Creatinine, Ser 0.94 0.61 - 1.24 mg/dL   Calcium 9.6 8.9 - 10.3 mg/dL   Total Protein 7.1 6.5 - 8.1 g/dL   Albumin 4.1 3.5 - 5.0 g/dL   AST 26 15 - 41 U/L   ALT 31 0 - 44 U/L   Alkaline Phosphatase 57 38 - 126 U/L   Total Bilirubin 1.1 0.3 - 1.2 mg/dL   GFR, Estimated >60 >60 mL/min    Comment: (NOTE) Calculated using the CKD-EPI Creatinine Equation (2021)    Anion gap 11 5 - 15    Comment: Performed at [facility], [address] [place], [place] [id number]  Salicylate level     Status: Abnormal   Collection Time: 12/11/20  6:32 AM  Result Value Ref Range   Salicylate Lvl Q000111Q (L) 7.0 - 30.0 mg/dL    Comment: Performed at [facility], [address] [place], [place] [address]  Acetaminophen level     Status: Abnormal   Collection Time: 12/11/20  6:32 AM  Result Value Ref Range   Acetaminophen (Tylenol), Serum <10 (L) 10 - 30 ug/mL     Comment: (NOTE) Therapeutic concentrations vary significantly. A range of 10-30 ug/mL  may be an effective concentration for many patients. However, some  are best treated at concentrations outside of this range. Acetaminophen concentrations >150 ug/mL at 4 hours after ingestion  and >50 ug/mL at 12 hours after ingestion are often associated with  toxic reactions.  Performed at [facility], [address] [place], [place] [address]   Ethanol     Status: Abnormal   Collection Time: 12/11/20  6:32 AM  Result Value Ref Range   Alcohol, Ethyl (B) 16 (H) <10 mg/dL    Comment: (NOTE) Lowest detectable limit for serum alcohol is 10 mg/dL.  For medical purposes only. Performed at [facility], [address] [place], [place] [address]   CBC WITH DIFFERENTIAL     Status: None   Collection Time: 12/11/20  6:32 AM  Result Value Ref Range   WBC 8.5 4.0 - 10.5 K/uL   RBC 4.67 4.22 - 5.81 MIL/uL   Hemoglobin 14.8 13.0 - 17.0 g/dL   HCT 45.7 39.0 - 52.0 %   MCV 97.9 80.0 - 100.0 fL   MCH 31.7 26.0 - 34.0 pg   MCHC 32.4 30.0 - 36.0 g/dL   RDW 13.1 11.5 - 15.5 %   Platelets 202 150 - 400 K/uL   nRBC 0.0 0.0 - 0.2 %   Neutrophils Relative % 72 %   Neutro Abs 6.2 1.7 - 7.7 K/uL   Lymphocytes Relative 17 %   Lymphs Abs 1.4 0.7 - 4.0 K/uL   Monocytes Relative 6 %   Monocytes Absolute 0.5 0.1 - 1.0 K/uL   Eosinophils Relative 4 %   Eosinophils Absolute 0.3 0.0 - 0.5 K/uL   Basophils Relative 1 %   Basophils Absolute 0.0 0.0 - 0.1 K/uL   Immature Granulocytes 0 %   Abs Immature Granulocytes 0.02 0.00 - 0.07 K/uL  Comment: Performed at [facility], [address] [place], [place] [address]  Magnesium     Status: None   Collection Time: 12/11/20  6:32 AM  Result Value Ref Range   Magnesium 2.3 1.7 - 2.4 mg/dL    Comment: Performed at [facility], [address] [place], [place] [address]  Brain natriuretic peptide     Status: Abnormal    Collection Time: 12/11/20  6:32 AM  Result Value Ref Range   B Natriuretic Peptide 150.9 (H) 0.0 - 100.0 pg/mL    Comment: Performed at [facility], [address], [place], [place] [address]  Resp Panel by RT-PCR (Flu A&B, Covid) Nasopharyngeal Swab     Status: None   Collection Time: 12/11/20  6:53 AM   Specimen: Nasopharyngeal Swab; Nasopharyngeal(NP) swabs in vial transport medium  Result Value Ref Range   SARS Coronavirus 2 by RT PCR NEGATIVE NEGATIVE    Comment: (NOTE) SARS-CoV-2 target nucleic acids are NOT DETECTED.  The SARS-CoV-2 RNA is generally detectable in upper respiratory specimens during the acute phase of infection. The lowest concentration of SARS-CoV-2 viral copies this assay can detect is 138 copies/mL. A negative result does not preclude SARS-Cov-2 infection and should not be used as the sole basis for treatment or other patient management decisions. A negative result may occur with  improper specimen collection/handling, submission of specimen other than nasopharyngeal swab, presence of viral mutation(s) within the areas targeted by this assay, and inadequate number of viral copies(<138 copies/mL). A negative result must be combined with clinical observations, patient history, and epidemiological information. The expected result is Negative.  Fact Sheet for Patients:  EntrepreneurPulse.com.au  Fact Sheet for Healthcare Providers:  IncredibleEmployment.be  This test is no t yet approved or cleared by the [place] FDA and  has been authorized for detection and/or diagnosis of SARS-CoV-2 by FDA under an Emergency Use Authorization (EUA). This EUA will remain  in effect (meaning this test can be used) for the duration of the COVID-19 declaration under Section 564(b)(1) of the Act, 21 U.S.C.section 360bbb-3(b)(1), unless the authorization is terminated  or revoked sooner.       Influenza A by PCR NEGATIVE  NEGATIVE   Influenza B by PCR NEGATIVE NEGATIVE    Comment: (NOTE) The Xpert Xpress SARS-CoV-2/FLU/RSV plus assay is intended as an aid in the diagnosis of influenza from Nasopharyngeal swab specimens and should not be used as a sole basis for treatment. Nasal washings and aspirates are unacceptable for Xpert Xpress SARS-CoV-2/FLU/RSV testing.  Fact Sheet for Patients: EntrepreneurPulse.com.au  Fact Sheet for Healthcare Providers: IncredibleEmployment.be  This test is not yet approved or cleared by the [place] FDA and has been authorized for detection and/or diagnosis of SARS-CoV-2 by FDA under an Emergency Use Authorization (EUA). This EUA will remain in effect (meaning this test can be used) for the duration of the COVID-19 declaration under Section 564(b)(1) of the Act, 21 U.S.C. section 360bbb-3(b)(1), unless the authorization is terminated or revoked.  Performed at [facility], [place], [place] [address]   Acetaminophen level     Status: Abnormal   Collection Time: 12/11/20  8:30 AM  Result Value Ref Range   Acetaminophen (Tylenol), Serum <10 (L) 10 - 30 ug/mL    Comment: (NOTE) Therapeutic concentrations vary significantly. A range of 10-30 ug/mL  may be an effective concentration for many patients. However, some  are best treated at concentrations outside of this range. Acetaminophen concentrations >  150 ug/mL at 4 hours after ingestion  and >50 ug/mL at 12 hours after ingestion are often associated with  toxic reactions.  Performed at [facility], [address] [place], [place] [address]     Current Facility-Administered Medications  Medication Dose Route Frequency Provider Last Rate Last Admin  . acetaminophen (TYLENOL) tablet 650 mg  650 mg Oral Q6H PRN Ivor Costa, MD      . albuterol (VENTOLIN HFA) 108 (90 Base) MCG/ACT inhaler 2 puff  2 puff Inhalation Q4H PRN Ivor Costa, MD       . ALPRAZolam Duanne Moron) tablet 1 mg  1 mg Oral NOW [name], John T, MD      . apixaban (ELIQUIS) tablet 5 mg  5 mg Oral BID [name], [name], MD      . azithromycin (ZITHROMAX) 500 mg in sodium chloride 0.9 % 250 mL IVPB  500 mg Intravenous Once Blake Divine, MD      . carvedilol (COREG) tablet 25 mg  25 mg Oral BID WC [name], John T, MD      . cefTRIAXone (ROCEPHIN) 1 g in sodium chloride 0.9 % 100 mL IVPB  1 g Intravenous Once Blake Divine, MD      . dexamethasone (DECADRON) injection 10 mg  10 mg Intravenous Once Blake Divine, MD      . dextromethorphan-guaiFENesin [facility] DM) 30-600 MG per 12 hr tablet 1 tablet  1 tablet Oral BID PRN Ivor Costa, MD      . ezetimibe (ZETIA) tablet 10 mg  10 mg Oral Daily [name], John T, MD      . furosemide (LASIX) tablet 40 mg  40 mg Oral Daily [name], John T, MD      . hydrALAZINE (APRESOLINE) injection 5 mg  5 mg Intravenous Q2H PRN Ivor Costa, MD      . lidocaine (LIDODERM) 5 % 1 patch  1 patch Transdermal Q24H Blake Divine, MD   1 patch at 12/11/20 [phone]  . ondansetron (ZOFRAN) tablet 4 mg  4 mg Oral Q6H PRN Ivor Costa, MD       Current Outpatient Medications  Medication Sig Dispense Refill  . aspirin EC 81 MG tablet Take 1 tablet (81 mg total) by mouth daily. 90 tablet 3  . carvedilol (COREG) 25 MG tablet Take 1 tablet (25 mg total) by mouth 2 (two) times daily with a meal. 60 tablet 3  . ELIQUIS 5 MG TABS tablet TAKE ONE TABLET TWICE DAILY 60 tablet 1  . ezetimibe (ZETIA) 10 MG tablet TAKE ONE (1) TABLET EACH DAY 30 tablet 3  . FLUoxetine (PROZAC) 40 MG capsule Take 1 capsule (40 mg total) by mouth at bedtime. 30 capsule 1  . gabapentin (NEURONTIN) 400 MG capsule Take 400 mg by mouth 3 (three) times daily.     . mirtazapine (REMERON) 15 MG tablet Take 1 tablet (15 mg total) by mouth at bedtime.    . nitroGLYCERIN (NITROSTAT) 0.4 MG SL tablet Place 1 tablet (0.4 mg total) under the tongue every 5 (five) minutes as needed for chest pain. 25  tablet 3  . rosuvastatin (CRESTOR) 40 MG tablet Take 1 tablet (40 mg total) by mouth daily. 90 tablet 3  . temazepam (RESTORIL) 15 MG capsule Take 1 capsule (15 mg total) by mouth at bedtime. 30 capsule 1  . traMADol (ULTRAM) 50 MG tablet Take 50-100 mg by mouth 2 (two) times daily as needed for moderate pain.      Musculoskeletal: Strength &  Muscle Tone: decreased Gait & Station: unsteady Patient leans: N/A  Psychiatric Specialty Exam: Physical Exam Vitals and nursing note reviewed.  Constitutional:      Appearance: He is well-developed and well-nourished.  HENT:     Head: Normocephalic and atraumatic.  Eyes:     Conjunctiva/sclera: Conjunctivae normal.     Pupils: Pupils are equal, round, and reactive to light.  Cardiovascular:     Heart sounds: Normal heart sounds.  Pulmonary:     Effort: Pulmonary effort is normal.  Abdominal:     Palpations: Abdomen is soft.  Musculoskeletal:        General: Normal range of motion.     Cervical back: Normal range of motion.  Skin:    General: Skin is warm and dry.  Neurological:     Mental Status: He is alert.     Comments: Patient is tremulous all over including notable tremors in his face and jaw.  To my examination there is some cogwheel stiffness in his joints and also some myoclonic reaction.  Psychiatric:        Attention and Perception: Attention normal.        Mood and Affect: Mood is depressed. Affect is blunt.        Speech: Speech is delayed.        Behavior: Behavior is slowed and withdrawn.        Thought Content: Thought content includes suicidal ideation. Thought content includes suicidal plan.        Cognition and Memory: Cognition is impaired.        Judgment: Judgment is impulsive.     Review of Systems  Constitutional: Negative.   HENT: Negative.   Eyes: Negative.   Respiratory: Negative.   Cardiovascular: Negative.   Gastrointestinal: Negative.   Musculoskeletal: Negative.   Skin: Negative.    Neurological: Negative.   Psychiatric/Behavioral: Positive for dysphoric mood, self-injury and suicidal ideas.    Blood pressure (!) 182/106, pulse 90, temperature 97.9 F (36.6 C), temperature source Oral, resp. rate 20, height 5\' 11"  (1.803 m), weight 103.9 kg, SpO2 90 %.Body mass index is 31.94 kg/m.  General Appearance: Casual  Eye Contact:  Minimal  Speech:  Slow  Volume:  Decreased  Mood:  Depressed  Affect:  He has something of a startled look about him.  Thought Process:  Coherent  Orientation:  Full (Time, Place, and Person)  Thought Content:  Logical  Suicidal Thoughts:  Yes.  with intent/plan  Homicidal Thoughts:  No  Memory:  Immediate;   Fair Recent;   Fair Remote;   Fair  Judgement:  Impaired  Insight:  Shallow  Psychomotor Activity:  Tremor  Concentration:  Concentration: Fair  Recall:  [employer] of Knowledge:  Fair  Language:  Fair  Akathisia:  No  Handed:  Right  AIMS (if indicated):     Assets:  Desire for Improvement Housing Resilience Social Support  ADL's:  Impaired  Cognition:  Impaired,  Mild  Sleep:        Treatment Plan Summary: Daily contact with patient to assess and evaluate symptoms and progress in treatment, Medication management and Plan 70 year old man status post overdose on fluoxetine and mirtazapine.  Also appears to have pneumonia of unclear etiology.  Also appears to have mild serotonin syndrome.  Patient will need medical stabilization before we can follow-up with readmission to the psychiatric ward.  For now I recommend discontinuation of all serotonergic medication.  Supportive and symptomatic treatment.  I  am also going to order a modest dose of Periactin.  The benefit of this medicine is not usually profound but the risk is very low as well.  I will order some standing modest dose benzodiazepines for the next day as well.  Disposition: Recommend psychiatric Inpatient admission when medically cleared. Supportive therapy provided  about ongoing stressors.  Alethia Berthold, MD 12/11/2020 11:43 AM

## 2020-12-11 NOTE — H&P (Signed)
History and Physical    Jeremy Reese [id number] DOB: Apr 23, 1951 DOA: 12/11/2020  Referring MD/NP/PA:   PCP: Cipriano Mile, NP   Patient coming from:  The patient is coming from home.  At baseline, pt is independent for most of ADL.        Chief Complaint: Suicidal attempt and overdose  HPI: Jeremy Reese is a 70 y.o. male with medical history significant of hypertension, hyperlipidemia, TIA, depression, lumbar spine stenosis, PAF on Eliquis, CPPD, CAD, stent placement, CABG, dCHF, PAD, who presents with suicidal attempt and overdose.  Per report, pt took approximately 24 of 40 mg prozac pills and 15 of 15mg  remeron pills around 0430 with intention to harm self. He drank a beer last night, and denies other drug use.  When I saw patient in ED, he is jittery and mildly tremulous.  He has nausea, but no vomiting, diarrhea or abdominal pain. Patient was found to have oxygen desaturation to 90% on room. He denies chest pain, cough, shortness of breath, fever or chills.  No symptoms of UTI.  Patient states that he has chronic back pain and sometime he feels groggy and "drunk like" in his legs.  Currently patient denies suicidal or homicidal ideations when I saw patient in ED.  ED Course: pt was found to have WBC 8.5, negative COVID PCR, alcohol level less than 11, Tylenol level less than 10 x2, salicylate level less than 7, electrolytes renal function okay, temperature normal, blood pressure 199/104, 182/106, heart rate 90, RR 24, 20.  Chest x-ray showed multifocal infiltration, particularly right upper and right middle lobe.  Patient is placed on MedSurg bed for obs. Dr. Weber Cooks of the psychiatry is consulted.  Review of Systems:   General: no fevers, chills, no body weight gain, has fatigue HEENT: no blurry vision, hearing changes or sore throat Respiratory: no dyspnea, coughing, wheezing CV: no chest pain, no palpitations GI: has nausea, no vomiting, abdominal pain, diarrhea,  constipation GU: no dysuria, burning on urination, increased urinary frequency, hematuria  Ext: no leg edema Neuro: no unilateral weakness, numbness, or tingling, no vision change or hearing loss Skin: no rash, no skin tear. MSK: has back pain Heme: No easy bruising.  Travel history: No recent long distant travel. Psych: denies suicidal or hemocidal ideation currently  Allergy:  Allergies  Allergen Reactions  . Propafenone Anxiety  . Codeine Nausea Only  . Penicillins Rash    Past Medical History:  Diagnosis Date  . Atrial flutter, paroxysmal ([place])   . Carotid artery disease without cerebral infarction ([place])   . Coronary artery disease 2011  . Flatback syndrome of thoracolumbar region   . H/O calcium pyrophosphate deposition disease (CPPD)   . History of TIA (transient ischemic attack)   . Hyperlipidemia   . Paroxysmal A-fib ([place])   . Spinal stenosis of lumbar region without neurogenic claudication     Past Surgical History:  Procedure Laterality Date  . BILATERAL CARPAL TUNNEL RELEASE    . CARDIAC CATHETERIZATION    . CIRCUMCISION, NON-NEWBORN    . CLIPPING OF ATRIAL APPENDAGE N/A 09/18/2020   Procedure: CLIPPING OF ATRIAL APPENDAGE USING 71 ATRICURE LAA  EXCLUSION SYSTEM;  Surgeon: Ivin Poot, MD;  Location: [facility];  Service: Open Heart Surgery;  Laterality: N/A;  . CORONARY ANGIOPLASTY WITH STENT PLACEMENT  2011  . CORONARY ARTERY BYPASS GRAFT N/A 09/18/2020   Procedure: CORONARY ARTERY BYPASS GRAFTING (CABG)X 3, ON PUMP, USING LEFT INTERAL MAMMARY ARTERY AND ENDOSCOPICALLY  HARVESTED RIGHT GREATER SAPHENOUS VEIN. LIMA TO LAD, SVG TO OM, SVG TO PD;  Surgeon: Ivin Poot, MD;  Location: [facility];  Service: Open Heart Surgery;  Laterality: N/A;  . ENDOVEIN HARVEST OF GREATER SAPHENOUS VEIN Right 09/18/2020   Procedure: ENDOVEIN HARVEST OF GREATER SAPHENOUS VEIN;  Surgeon: Ivin Poot, MD;  Location: [facility];  Service: Open Heart Surgery;  Laterality: Right;  .  LEFT HEART CATH AND CORONARY ANGIOGRAPHY Left 09/12/2020   Procedure: LEFT HEART CATH AND CORONARY ANGIOGRAPHY;  Surgeon: Minna Merritts, MD;  Location: [facility];  Service: Cardiovascular;  Laterality: Left;  . TEE WITHOUT CARDIOVERSION N/A 09/18/2020   Procedure: TRANSESOPHAGEAL ECHOCARDIOGRAM (TEE);  Surgeon: Prescott Gum, Collier Salina, MD;  Location: [facility];  Service: Open Heart Surgery;  Laterality: N/A;    Social History:  reports that he quit smoking about 27 years ago. His smoking use included cigarettes. He has a 8.75 pack-year smoking history. He has never used smokeless tobacco. He reports current alcohol use. He reports that he does not use drugs.  Family History:  Family History  Problem Relation Age of Onset  . Heart disease Mother 24       CABG x 3   . Hypertension Father      Prior to Admission medications   Medication Sig Start Date End Date Taking? Authorizing Provider  aspirin EC 81 MG tablet Take 1 tablet (81 mg total) by mouth daily. 12/05/18   Minna Merritts, MD  carvedilol (COREG) 25 MG tablet Take 1 tablet (25 mg total) by mouth 2 (two) times daily with a meal. 09/23/20   Barrett, Erin R, PA-C  ELIQUIS 5 MG TABS tablet TAKE ONE TABLET TWICE DAILY 05/01/18   Minna Merritts, MD  ezetimibe (ZETIA) 10 MG tablet TAKE ONE (1) TABLET EACH DAY 02/17/18   Minna Merritts, MD  FLUoxetine (PROZAC) 40 MG capsule Take 1 capsule (40 mg total) by mouth at bedtime. 11/14/20   [name], MD  gabapentin (NEURONTIN) 400 MG capsule Take 400 mg by mouth 3 (three) times daily.     [provider]  mirtazapine (REMERON) 15 MG tablet Take 1 tablet (15 mg total) by mouth at bedtime. 12/08/20   Minna Merritts, MD  nitroGLYCERIN (NITROSTAT) 0.4 MG SL tablet Place 1 tablet (0.4 mg total) under the tongue every 5 (five) minutes as needed for chest pain. 09/05/20   Loel Dubonnet, NP  rosuvastatin (CRESTOR) 40 MG tablet Take 1 tablet (40 mg total) by mouth daily. 09/05/20    Loel Dubonnet, NP  temazepam (RESTORIL) 15 MG capsule Take 1 capsule (15 mg total) by mouth at bedtime. 11/14/20   [name], MD  traMADol (ULTRAM) 50 MG tablet Take 50-100 mg by mouth 2 (two) times daily as needed for moderate pain.    [provider]    Physical Exam: Vitals:   12/11/20 [id number] 12/11/20 0621 12/11/20 0831 12/11/20 0910  BP: (!) 199/104  (!) 182/106   Pulse: 83  90   Resp: (!) 24  20   Temp: 97.6 F (36.4 C)  97.9 F (36.6 C)   TempSrc: Oral  Oral   SpO2: 97%  96% 90%  Weight:  103.9 kg    Height:  5\' 11"  (1.803 m)     General: Not in acute distress HEENT:       Eyes: PERRL, EOMI, no scleral icterus.       ENT: No  discharge from the ears and nose, no pharynx injection, no tonsillar enlargement.        Neck: No JVD, no bruit, no mass felt. Heme: No neck lymph node enlargement. Cardiac: S1/S2, RRR, No murmurs, No gallops or rubs. Respiratory: No rales, wheezing, rhonchi or rubs. GI: Soft, nondistended, nontender, no rebound pain, no organomegaly, BS present. GU: No hematuria Ext: No pitting leg edema bilaterally. 2+DP/PT pulse bilaterally. Musculoskeletal: No joint deformities, No joint redness or warmth, no limitation of ROM in spin. Skin: No rashes.  Neuro: Alert, oriented X3, cranial nerves II-XII grossly intact, moves all extremities normally.  Psych: denies suicidal or hemocidal ideation currently  Labs on Admission: I have personally reviewed following labs and imaging studies  CBC: Recent Labs  Lab 12/11/20 0632  WBC 8.5  NEUTROABS 6.2  HGB 14.8  HCT 45.7  MCV 97.9  PLT 557   Basic Metabolic Panel: Recent Labs  Lab 12/11/20 0632  NA 137  K 4.0  CL 102  CO2 24  GLUCOSE 157*  BUN 16  CREATININE 0.94  CALCIUM 9.6  MG 2.3   GFR: Estimated Creatinine Clearance: 92.2 mL/min (by C-G formula based on SCr of 0.94 mg/dL). Liver Function Tests: Recent Labs  Lab 12/11/20 0632  AST 26  ALT 31  ALKPHOS 57  BILITOT 1.1   PROT 7.1  ALBUMIN 4.1   No results for input(s): LIPASE, AMYLASE in the last 168 hours. No results for input(s): AMMONIA in the last 168 hours. Coagulation Profile: No results for input(s): INR, PROTIME in the last 168 hours. Cardiac Enzymes: No results for input(s): CKTOTAL, CKMB, CKMBINDEX, TROPONINI in the last 168 hours. BNP (last 3 results) No results for input(s): PROBNP in the last 8760 hours. HbA1C: No results for input(s): HGBA1C in the last 72 hours. CBG: Recent Labs  Lab 12/11/20 0631  GLUCAP 163*   Lipid Profile: No results for input(s): CHOL, HDL, LDLCALC, TRIG, CHOLHDL, LDLDIRECT in the last 72 hours. Thyroid Function Tests: No results for input(s): TSH, T4TOTAL, FREET4, T3FREE, THYROIDAB in the last 72 hours. Anemia Panel: No results for input(s): VITAMINB12, FOLATE, FERRITIN, TIBC, IRON, RETICCTPCT in the last 72 hours. Urine analysis:    Component Value Date/Time   COLORURINE YELLOW (A) 11/11/2020 1314   APPEARANCEUR CLEAR (A) 11/11/2020 1314   LABSPEC 1.018 11/11/2020 1314   PHURINE 6.0 11/11/2020 1314   GLUCOSEU NEGATIVE 11/11/2020 1314   HGBUR NEGATIVE 11/11/2020 1314   BILIRUBINUR NEGATIVE 11/11/2020 1314   KETONESUR NEGATIVE 11/11/2020 1314   PROTEINUR 30 (A) 11/11/2020 1314   NITRITE NEGATIVE 11/11/2020 1314   LEUKOCYTESUR NEGATIVE 11/11/2020 1314   Sepsis Labs: @LABRCNTIP (procalcitonin:4,lacticidven:4) ) Recent Results (from the past 240 hour(s))  Resp Panel by RT-PCR (Flu A&B, Covid) Nasopharyngeal Swab     Status: None   Collection Time: 12/11/20  6:53 AM   Specimen: Nasopharyngeal Swab; Nasopharyngeal(NP) swabs in vial transport medium  Result Value Ref Range Status   SARS Coronavirus 2 by RT PCR NEGATIVE NEGATIVE Final    Comment: (NOTE) SARS-CoV-2 target nucleic acids are NOT DETECTED.  The SARS-CoV-2 RNA is generally detectable in upper respiratory specimens during the acute phase of infection. The lowest concentration of  SARS-CoV-2 viral copies this assay can detect is 138 copies/mL. A negative result does not preclude SARS-Cov-2 infection and should not be used as the sole basis for treatment or other patient management decisions. A negative result may occur with  improper specimen collection/handling, submission of specimen other than nasopharyngeal swab,  presence of viral mutation(s) within the areas targeted by this assay, and inadequate number of viral copies(<138 copies/mL). A negative result must be combined with clinical observations, patient history, and epidemiological information. The expected result is Negative.  Fact Sheet for Patients:  EntrepreneurPulse.com.au  Fact Sheet for Healthcare Providers:  IncredibleEmployment.be  This test is no t yet approved or cleared by the [place] FDA and  has been authorized for detection and/or diagnosis of SARS-CoV-2 by FDA under an Emergency Use Authorization (EUA). This EUA will remain  in effect (meaning this test can be used) for the duration of the COVID-19 declaration under Section 564(b)(1) of the Act, 21 U.S.C.section 360bbb-3(b)(1), unless the authorization is terminated  or revoked sooner.       Influenza A by PCR NEGATIVE NEGATIVE Final   Influenza B by PCR NEGATIVE NEGATIVE Final    Comment: (NOTE) The Xpert Xpress SARS-CoV-2/FLU/RSV plus assay is intended as an aid in the diagnosis of influenza from Nasopharyngeal swab specimens and should not be used as a sole basis for treatment. Nasal washings and aspirates are unacceptable for Xpert Xpress SARS-CoV-2/FLU/RSV testing.  Fact Sheet for Patients: EntrepreneurPulse.com.au  Fact Sheet for Healthcare Providers: IncredibleEmployment.be  This test is not yet approved or cleared by the [place] FDA and has been authorized for detection and/or diagnosis of SARS-CoV-2 by FDA under an Emergency Use  Authorization (EUA). This EUA will remain in effect (meaning this test can be used) for the duration of the COVID-19 declaration under Section 564(b)(1) of the Act, 21 U.S.C. section 360bbb-3(b)(1), unless the authorization is terminated or revoked.  Performed at [facility], [address] [place], [place] [address]      Radiological Exams on Admission: DG Chest [facility] 1 View  Result Date: 12/11/2020 CLINICAL DATA:  Shortness of breath EXAM: PORTABLE CHEST 1 VIEW COMPARISON:  October 29, 2020 FINDINGS: Ill-defined airspace opacity is noted in portions of the right upper lobe and right mid lung region. No consolidation. Left lung clear. Heart is mildly enlarged with pulmonary venous hypertension. Patient is status post coronary artery bypass grafting. There is a left atrial appendage clamp. No adenopathy. No appreciable bone lesions. IMPRESSION: 1. Ill-defined airspace opacity right upper lobe and right mid lung regions. Suspect multifocal pneumonia, likely of atypical organism etiology. Correlation with COVID-19 status advised given this appearance. 2. Mild cardiomegaly with pulmonary vascular congestion. Postoperative changes noted. Electronically Signed   By: Lowella Grip III M.D.   On: 12/11/2020 10:26     EKG: I have personally reviewed.  Atrial fibrillation, QTC 427, nonspecific T wave change  Assessment/Plan Principal Problem:   Multifocal pneumonia Active Problems:   Hyperlipidemia   Chronic atrial fibrillation (HCC)   Chronic diastolic congestive heart failure (HCC)   CAD, multiple vessel   Hypertension   History of TIA (transient ischemic attack)   Spinal stenosis of lumbar region without neurogenic claudication   Depression   Suicide attempt ([place])   Serotonin syndrome   Overdose   Multifocal pneumonia: Chest x-ray showed multifocal infiltration, particularly on right upper lobe and right middle lobe.  Patient denies symptoms for pneumonia, but he has  oxygen desaturation to 90% on room air.  Currently on 2 L oxygen.  COVID PCR negative.  - Placed on MedSurg bed for observation - IV Rocephin and azithromycin - Mucinex for cough  - Bronchodilators - Urine legionella and S. pneumococcal antigen - Follow up blood culture x2, sputum culture  Overdose with suicidal attempt and  serotonin syndrome: Patient is mildly tremulous and jittery.  Dr. Weber Cooks of psychiatry is consulted, who thinks patient has serotonin syndrome. EDP spoke with Lennette Bihari at [facility].  "Watch for CNS depression with Remeron.  Monitor QRS and QTc for prolongation.  Replace potassium and magnesium to high level of normal.  Obs 8 hours and repeat EKG at that time.  Recommends repeat tylenol level at 4 hours post ingestion". -Suicidal precaution - discontinuation of all serotonergic medication.  -avoid using zofran since it can worsen serotonin syndrome per Dr. Weber Cooks -can use Phenergan for nausea or vomiting per Dr. Weber Cooks -Cyproheptadine 4 mg twice a day -Ativan 1 mg every 6 hours as needed  Depression: -hold home meds now  Hyperlipidemia -Zetia and crestor  Chronic atrial fibrillation ([place]) -coreg and Eliquis  Chronic diastolic congestive heart failure ([place]): echo on 09/13/2020 showed EF of 60 to 65%.  No leg edema or JVD.  CHF seems to be compensated for -check BNP  CAD, multiple vessel -Continue aspirin, Zetia, Crestor -As needed nitroglycerin  Hypertension -Continue Coreg -IV hydralazine.  History of TIA (transient ischemic attack) -ASA, Zetia, Crestor  Spinal stenosis of lumbar region without neurogenic claudication -As needed Percocet -Follow-up for MRI of her lumbar spine which was ordered by ED physician           DVT ppx: on Eliquis Code Status: Full code Family Communication: not done, no family member is at bed side.   Disposition Plan:  Anticipate discharge back to previous environment Consults called: Dr. Weber Cooks of  psychiatry Admission status: Med-surg bed for obs  Status is: Observation  The patient remains OBS appropriate and will d/c before 2 midnights.  Dispo: The patient is from: Home              Anticipated d/c is to: likely to behavioral health unit              Anticipated d/c date is: 1 day              Patient currently is not medically stable to d/c.          Date of Service 12/11/2020    Ivor Costa [facility]   If 7PM-7AM, please contact night-coverage [email or web address] 12/11/2020, 1:41 PM

## 2020-12-11 NOTE — ED Notes (Signed)
INVOLUNTARY but pt is being admitted medically, all IVC papers accounted for and on chart.

## 2020-12-11 NOTE — ED Notes (Signed)
Patient refused his meal at this time

## 2020-12-11 NOTE — ED Notes (Signed)
Pt states he is no long wanting to hurt himself.

## 2020-12-11 NOTE — ED Notes (Signed)
Pt not wanting to take his Coreg, Eliquis or lasix at this time.

## 2020-12-11 NOTE — ED Notes (Signed)
PT  PLACED  UNDER  IVC  PAPERS  PER  DR  JESSUP MD  INFORMED  AMY  RN

## 2020-12-11 NOTE — ED Notes (Signed)
Food and water given to pt

## 2020-12-12 DIAGNOSIS — Z951 Presence of aortocoronary bypass graft: Secondary | ICD-10-CM | POA: Diagnosis not present

## 2020-12-12 DIAGNOSIS — T43222A Poisoning by selective serotonin reuptake inhibitors, intentional self-harm, initial encounter: Secondary | ICD-10-CM | POA: Diagnosis present

## 2020-12-12 DIAGNOSIS — Z6831 Body mass index (BMI) 31.0-31.9, adult: Secondary | ICD-10-CM | POA: Diagnosis not present

## 2020-12-12 DIAGNOSIS — I482 Chronic atrial fibrillation, unspecified: Secondary | ICD-10-CM | POA: Diagnosis present

## 2020-12-12 DIAGNOSIS — I4892 Unspecified atrial flutter: Secondary | ICD-10-CM | POA: Diagnosis present

## 2020-12-12 DIAGNOSIS — M48061 Spinal stenosis, lumbar region without neurogenic claudication: Secondary | ICD-10-CM | POA: Diagnosis present

## 2020-12-12 DIAGNOSIS — T50902A Poisoning by unspecified drugs, medicaments and biological substances, intentional self-harm, initial encounter: Secondary | ICD-10-CM | POA: Diagnosis not present

## 2020-12-12 DIAGNOSIS — F332 Major depressive disorder, recurrent severe without psychotic features: Secondary | ICD-10-CM | POA: Diagnosis not present

## 2020-12-12 DIAGNOSIS — T43022A Poisoning by tetracyclic antidepressants, intentional self-harm, initial encounter: Secondary | ICD-10-CM | POA: Diagnosis present

## 2020-12-12 DIAGNOSIS — G9341 Metabolic encephalopathy: Secondary | ICD-10-CM | POA: Diagnosis present

## 2020-12-12 DIAGNOSIS — I110 Hypertensive heart disease with heart failure: Secondary | ICD-10-CM | POA: Diagnosis present

## 2020-12-12 DIAGNOSIS — I480 Paroxysmal atrial fibrillation: Secondary | ICD-10-CM | POA: Diagnosis present

## 2020-12-12 DIAGNOSIS — M549 Dorsalgia, unspecified: Secondary | ICD-10-CM | POA: Diagnosis present

## 2020-12-12 DIAGNOSIS — R7401 Elevation of levels of liver transaminase levels: Secondary | ICD-10-CM | POA: Diagnosis present

## 2020-12-12 DIAGNOSIS — F32A Depression, unspecified: Secondary | ICD-10-CM | POA: Diagnosis present

## 2020-12-12 DIAGNOSIS — I2510 Atherosclerotic heart disease of native coronary artery without angina pectoris: Secondary | ICD-10-CM | POA: Diagnosis present

## 2020-12-12 DIAGNOSIS — G8929 Other chronic pain: Secondary | ICD-10-CM | POA: Diagnosis present

## 2020-12-12 DIAGNOSIS — I5032 Chronic diastolic (congestive) heart failure: Secondary | ICD-10-CM | POA: Diagnosis present

## 2020-12-12 DIAGNOSIS — E785 Hyperlipidemia, unspecified: Secondary | ICD-10-CM | POA: Diagnosis present

## 2020-12-12 DIAGNOSIS — T1491XA Suicide attempt, initial encounter: Secondary | ICD-10-CM | POA: Diagnosis not present

## 2020-12-12 DIAGNOSIS — E669 Obesity, unspecified: Secondary | ICD-10-CM | POA: Diagnosis present

## 2020-12-12 DIAGNOSIS — I739 Peripheral vascular disease, unspecified: Secondary | ICD-10-CM | POA: Diagnosis present

## 2020-12-12 DIAGNOSIS — J9621 Acute and chronic respiratory failure with hypoxia: Secondary | ICD-10-CM | POA: Diagnosis present

## 2020-12-12 DIAGNOSIS — F419 Anxiety disorder, unspecified: Secondary | ICD-10-CM | POA: Diagnosis present

## 2020-12-12 DIAGNOSIS — Z8673 Personal history of transient ischemic attack (TIA), and cerebral infarction without residual deficits: Secondary | ICD-10-CM | POA: Diagnosis not present

## 2020-12-12 DIAGNOSIS — Z20822 Contact with and (suspected) exposure to covid-19: Secondary | ICD-10-CM | POA: Diagnosis present

## 2020-12-12 DIAGNOSIS — F5104 Psychophysiologic insomnia: Secondary | ICD-10-CM | POA: Diagnosis present

## 2020-12-12 LAB — BASIC METABOLIC PANEL
Anion gap: 10 (ref 5–15)
BUN: 24 mg/dL — ABNORMAL HIGH (ref 8–23)
CO2: 24 mmol/L (ref 22–32)
Calcium: 9.7 mg/dL (ref 8.9–10.3)
Chloride: 108 mmol/L (ref 98–111)
Creatinine, Ser: 1.08 mg/dL (ref 0.61–1.24)
GFR, Estimated: >60 mL/min (ref >60)
Glucose, Bld: 139 mg/dL — ABNORMAL HIGH (ref 70–99)
Potassium: 4.4 mmol/L (ref 3.5–5.1)
Sodium: 142 mmol/L (ref 135–145)

## 2020-12-12 LAB — CBC
HCT: 46.9 % (ref 39.0–52.0)
Hemoglobin: 15.7 g/dL (ref 13.0–17.0)
MCH: 32 pg (ref 26.0–34.0)
MCHC: 33.5 g/dL (ref 30.0–36.0)
MCV: 95.5 fL (ref 80.0–100.0)
Platelets: 210 10*3/uL (ref 150–400)
RBC: 4.91 MIL/uL (ref 4.22–5.81)
RDW: 13.2 % (ref 11.5–15.5)
WBC: 19.3 10*3/uL — ABNORMAL HIGH (ref 4.0–10.5)
nRBC: 0 % (ref 0.0–0.2)

## 2020-12-12 LAB — STREP PNEUMONIAE URINARY ANTIGEN: Strep Pneumo Urinary Antigen: NEGATIVE

## 2020-12-12 LAB — BRAIN NATRIURETIC PEPTIDE: B Natriuretic Peptide: 565.7 pg/mL — ABNORMAL HIGH (ref 0.0–100.0)

## 2020-12-12 LAB — HIV ANTIBODY (ROUTINE TESTING W REFLEX): HIV Screen 4th Generation wRfx: NONREACTIVE

## 2020-12-12 LAB — PROCALCITONIN: Procalcitonin: <0.1 ng/mL

## 2020-12-12 MED ORDER — FUROSEMIDE 10 MG/ML IJ SOLN
20.0000 mg | Freq: Two times a day (BID) | INTRAMUSCULAR | Status: DC
  Administered 2020-12-12 – 2020-12-13 (×2): 20 mg via INTRAVENOUS
  Filled 2020-12-12 (×3): qty 4

## 2020-12-12 NOTE — Consult Note (Signed)
[facility] Consult   Reason for Consult: Follow-up for 70 year old man status post suicide attempt by overdose who is now admitted to medicine Referring Physician: [place] Pink Patient Identification: Jeremy Reese MRN:  [id number] Principal Diagnosis: Multifocal pneumonia Diagnosis:  Principal Problem:   Multifocal pneumonia Active Problems:   Hyperlipidemia   Chronic atrial fibrillation (HCC)   Chronic diastolic congestive heart failure (HCC)   CAD, multiple vessel   Hypertension   History of TIA (transient ischemic attack)   Spinal stenosis of lumbar region without neurogenic claudication   Depression   Suicide attempt ([place])   Serotonin syndrome   Overdose   Total Time spent with patient: 30 minutes  Subjective:   Jeremy Reese is a 70 y.o. male patient admitted with "I am still a little nervous".  HPI: Patient seen for follow-up.  He is still in the emergency room today although he has been officially admitted to the medicine service.  He is awaiting bed placement.  Patient is off of oxygen at the moment and says he is breathing better.  Still feeling nervous and visibly still having a little bit of tremulousness but looks more relaxed than yesterday.  Mood anxious.  Denies current suicidal intent.  Still a little confused intermittently.  Past Psychiatric History: Past history of severe depression with more than 1 suicide attempt  Risk to Self:   Risk to Others:   Prior Inpatient Therapy:   Prior Outpatient Therapy:    Past Medical History:  Past Medical History:  Diagnosis Date  . Atrial flutter, paroxysmal ([place])   . Carotid artery disease without cerebral infarction ([place])   . Coronary artery disease 2011  . Flatback syndrome of thoracolumbar region   . H/O calcium pyrophosphate deposition disease (CPPD)   . History of TIA (transient ischemic attack)   . Hyperlipidemia   . Paroxysmal A-fib ([place])   . Spinal stenosis of lumbar region without  neurogenic claudication     Past Surgical History:  Procedure Laterality Date  . BILATERAL CARPAL TUNNEL RELEASE    . CARDIAC CATHETERIZATION    . CIRCUMCISION, NON-NEWBORN    . CLIPPING OF ATRIAL APPENDAGE N/A 09/18/2020   Procedure: CLIPPING OF ATRIAL APPENDAGE USING 27 ATRICURE LAA  EXCLUSION SYSTEM;  Surgeon: Ivin Poot, MD;  Location: [facility];  Service: Open Heart Surgery;  Laterality: N/A;  . CORONARY ANGIOPLASTY WITH STENT PLACEMENT  2011  . CORONARY ARTERY BYPASS GRAFT N/A 09/18/2020   Procedure: CORONARY ARTERY BYPASS GRAFTING (CABG)X 3, ON PUMP, USING LEFT INTERAL MAMMARY ARTERY AND ENDOSCOPICALLY HARVESTED RIGHT GREATER SAPHENOUS VEIN. LIMA TO LAD, SVG TO OM, SVG TO PD;  Surgeon: Ivin Poot, MD;  Location: [facility];  Service: Open Heart Surgery;  Laterality: N/A;  . ENDOVEIN HARVEST OF GREATER SAPHENOUS VEIN Right 09/18/2020   Procedure: ENDOVEIN HARVEST OF GREATER SAPHENOUS VEIN;  Surgeon: Ivin Poot, MD;  Location: [facility];  Service: Open Heart Surgery;  Laterality: Right;  . LEFT HEART CATH AND CORONARY ANGIOGRAPHY Left 09/12/2020   Procedure: LEFT HEART CATH AND CORONARY ANGIOGRAPHY;  Surgeon: Minna Merritts, MD;  Location: [facility];  Service: Cardiovascular;  Laterality: Left;  . TEE WITHOUT CARDIOVERSION N/A 09/18/2020   Procedure: TRANSESOPHAGEAL ECHOCARDIOGRAM (TEE);  Surgeon: Prescott Gum, Collier Salina, MD;  Location: [facility];  Service: Open Heart Surgery;  Laterality: N/A;   Family History:  Family History  Problem Relation Age of Onset  . Heart disease Mother 52  CABG x 3   . Hypertension Father    Family Psychiatric  History: See previous Social History:  Social History   Substance and Sexual Activity  Alcohol Use Yes   Comment: rare      Social History   Substance and Sexual Activity  Drug Use No    Social History   Socioeconomic History  . Marital status: Married    Spouse name: Not on file  . Number of children: Not on file  .  Years of education: Not on file  . Highest education level: Not on file  Occupational History  . Not on file  Tobacco Use  . Smoking status: Former Smoker    Packs/day: 0.25    Years: 35.00    Pack years: 8.75    Types: Cigarettes    Quit date: 11/29/1993    Years since quitting: 27.0  . Smokeless tobacco: Never Used  Vaping Use  . Vaping Use: Never used  Substance and Sexual Activity  . Alcohol use: Yes    Comment: rare   . Drug use: No  . Sexual activity: Not on file  Other Topics Concern  . Not on file  Social History Narrative  . Not on file   Social Determinants of Health   Financial Resource Strain: Not on file  Food Insecurity: Not on file  Transportation Needs: Not on file  Physical Activity: Not on file  Stress: Not on file  Social Connections: Not on file   Additional Social History:    Allergies:   Allergies  Allergen Reactions  . Propafenone Anxiety  . Codeine Nausea Only  . Penicillins Rash    Labs:  Results for orders placed or performed during the hospital encounter of 12/11/20 (from the past 48 hour(s))  CBG monitoring, ED     Status: Abnormal   Collection Time: 12/11/20  6:31 AM  Result Value Ref Range   Glucose-Capillary 163 (H) 70 - 99 mg/dL    Comment: Glucose reference range applies only to samples taken after fasting for at least 8 hours.  Comprehensive metabolic panel     Status: Abnormal   Collection Time: 12/11/20  6:32 AM  Result Value Ref Range   Sodium 137 135 - 145 mmol/L   Potassium 4.0 3.5 - 5.1 mmol/L   Chloride 102 98 - 111 mmol/L   CO2 24 22 - 32 mmol/L   Glucose, Bld 157 (H) 70 - 99 mg/dL    Comment: Glucose reference range applies only to samples taken after fasting for at least 8 hours.   BUN 16 8 - 23 mg/dL   Creatinine, Ser 0.94 0.61 - 1.24 mg/dL   Calcium 9.6 8.9 - 10.3 mg/dL   Total Protein 7.1 6.5 - 8.1 g/dL   Albumin 4.1 3.5 - 5.0 g/dL   AST 26 15 - 41 U/L   ALT 31 0 - 44 U/L   Alkaline Phosphatase 57 38 -  126 U/L   Total Bilirubin 1.1 0.3 - 1.2 mg/dL   GFR, Estimated >60 >60 mL/min    Comment: (NOTE) Calculated using the CKD-EPI Creatinine Equation (2021)    Anion gap 11 5 - 15    Comment: Performed at [facility], [address] [place], [place] [id number]  Salicylate level     Status: Abnormal   Collection Time: 12/11/20  6:32 AM  Result Value Ref Range   Salicylate Lvl [id number] (L) 7.0 - 30.0 mg/dL    Comment: Performed at [facility] [employer]  [facility], [place], [place] [address]  Acetaminophen level     Status: Abnormal   Collection Time: 12/11/20  6:32 AM  Result Value Ref Range   Acetaminophen (Tylenol), Serum <10 (L) 10 - 30 ug/mL    Comment: (NOTE) Therapeutic concentrations vary significantly. A range of 10-30 ug/mL  may be an effective concentration for many patients. However, some  are best treated at concentrations outside of this range. Acetaminophen concentrations >150 ug/mL at 4 hours after ingestion  and >50 ug/mL at 12 hours after ingestion are often associated with  toxic reactions.  Performed at [facility], [address] [place], [place] [address]   Ethanol     Status: Abnormal   Collection Time: 12/11/20  6:32 AM  Result Value Ref Range   Alcohol, Ethyl (B) 16 (H) <10 mg/dL    Comment: (NOTE) Lowest detectable limit for serum alcohol is 10 mg/dL.  For medical purposes only. Performed at [facility], [address] [place], [place] [address]   CBC WITH DIFFERENTIAL     Status: None   Collection Time: 12/11/20  6:32 AM  Result Value Ref Range   WBC 8.5 4.0 - 10.5 K/uL   RBC 4.67 4.22 - 5.81 MIL/uL   Hemoglobin 14.8 13.0 - 17.0 g/dL   HCT 45.7 39.0 - 52.0 %   MCV 97.9 80.0 - 100.0 fL   MCH 31.7 26.0 - 34.0 pg   MCHC 32.4 30.0 - 36.0 g/dL   RDW 13.1 11.5 - 15.5 %   Platelets 202 150 - 400 K/uL   nRBC 0.0 0.0 - 0.2 %   Neutrophils Relative % 72 %   Neutro Abs 6.2 1.7 - 7.7 K/uL   Lymphocytes Relative 17 %    Lymphs Abs 1.4 0.7 - 4.0 K/uL   Monocytes Relative 6 %   Monocytes Absolute 0.5 0.1 - 1.0 K/uL   Eosinophils Relative 4 %   Eosinophils Absolute 0.3 0.0 - 0.5 K/uL   Basophils Relative 1 %   Basophils Absolute 0.0 0.0 - 0.1 K/uL   Immature Granulocytes 0 %   Abs Immature Granulocytes 0.02 0.00 - 0.07 K/uL    Comment: Performed at [facility], [address] [place], [place] [address]  Magnesium     Status: None   Collection Time: 12/11/20  6:32 AM  Result Value Ref Range   Magnesium 2.3 1.7 - 2.4 mg/dL    Comment: Performed at [facility], [place],  [address]  Procalcitonin - Baseline     Status: None   Collection Time: 12/11/20  6:32 AM  Result Value Ref Range   Procalcitonin <0.10 ng/mL    Comment:        Interpretation: PCT (Procalcitonin) <= 0.5 ng/mL: Systemic infection (sepsis) is not likely. Local bacterial infection is possible. (NOTE)       Sepsis PCT Algorithm           Lower Respiratory Tract                                      Infection PCT Algorithm    ----------------------------     ----------------------------         PCT < 0.25 ng/mL                PCT < 0.10 ng/mL  Strongly encourage             Strongly discourage   discontinuation of antibiotics    initiation of antibiotics    ----------------------------     -----------------------------       PCT 0.25 - 0.50 ng/mL            PCT 0.10 - 0.25 ng/mL               OR       >80% decrease in PCT            Discourage initiation of                                            antibiotics      Encourage discontinuation           of antibiotics    ----------------------------     -----------------------------         PCT >= 0.50 ng/mL              PCT 0.26 - 0.50 ng/mL               AND        <80% decrease in PCT             Encourage initiation of                                             antibiotics       Encourage continuation           of  antibiotics    ----------------------------     -----------------------------        PCT >= 0.50 ng/mL                  PCT > 0.50 ng/mL               AND         increase in PCT                  Strongly encourage                                      initiation of antibiotics    Strongly encourage escalation           of antibiotics                                     -----------------------------                                           PCT <= 0.25 ng/mL                                                 OR                                        >  80% decrease in PCT                                      Discontinue / Do not initiate                                             antibiotics  Performed at [facility], [address] [place], [place] [address]   Brain natriuretic peptide     Status: Abnormal   Collection Time: 12/11/20  6:32 AM  Result Value Ref Range   B Natriuretic Peptide 150.9 (H) 0.0 - 100.0 pg/mL    Comment: Performed at [facility], [address] [place], [place] [address]  Resp Panel by RT-PCR (Flu A&B, Covid) Nasopharyngeal Swab     Status: None   Collection Time: 12/11/20  6:53 AM   Specimen: Nasopharyngeal Swab; Nasopharyngeal(NP) swabs in vial transport medium  Result Value Ref Range   SARS Coronavirus 2 by RT PCR NEGATIVE NEGATIVE    Comment: (NOTE) SARS-CoV-2 target nucleic acids are NOT DETECTED.  The SARS-CoV-2 RNA is generally detectable in upper respiratory specimens during the acute phase of infection. The lowest concentration of SARS-CoV-2 viral copies this assay can detect is 138 copies/mL. A negative result does not preclude SARS-Cov-2 infection and should not be used as the sole basis for treatment or other patient management decisions. A negative result may occur with  improper specimen collection/handling, submission of specimen other than nasopharyngeal swab, presence of viral mutation(s) within the areas targeted  by this assay, and inadequate number of viral copies(<138 copies/mL). A negative result must be combined with clinical observations, patient history, and epidemiological information. The expected result is Negative.  Fact Sheet for Patients:  EntrepreneurPulse.com.au  Fact Sheet for Healthcare Providers:  IncredibleEmployment.be  This test is no t yet approved or cleared by the [place] FDA and  has been authorized for detection and/or diagnosis of SARS-CoV-2 by FDA under an Emergency Use Authorization (EUA). This EUA will remain  in effect (meaning this test can be used) for the duration of the COVID-19 declaration under Section 564(b)(1) of the Act, 21 U.S.C.section 360bbb-3(b)(1), unless the authorization is terminated  or revoked sooner.       Influenza A by PCR NEGATIVE NEGATIVE   Influenza B by PCR NEGATIVE NEGATIVE    Comment: (NOTE) The Xpert Xpress SARS-CoV-2/FLU/RSV plus assay is intended as an aid in the diagnosis of influenza from Nasopharyngeal swab specimens and should not be used as a sole basis for treatment. Nasal washings and aspirates are unacceptable for Xpert Xpress SARS-CoV-2/FLU/RSV testing.  Fact Sheet for Patients: EntrepreneurPulse.com.au  Fact Sheet for Healthcare Providers: IncredibleEmployment.be  This test is not yet approved or cleared by the [place] FDA and has been authorized for detection and/or diagnosis of SARS-CoV-2 by FDA under an Emergency Use Authorization (EUA). This EUA will remain in effect (meaning this test can be used) for the duration of the COVID-19 declaration under Section 564(b)(1) of the Act, 21 U.S.C. section 360bbb-3(b)(1), unless the authorization is terminated or revoked.  Performed at [facility], [address], [place], [place] [address]   Acetaminophen level     Status: Abnormal   Collection Time: 12/11/20  8:30 AM   Result Value Ref Range  Acetaminophen (Tylenol), Serum <10 (L) 10 - 30 ug/mL    Comment: (NOTE) Therapeutic concentrations vary significantly. A range of 10-30 ug/mL  may be an effective concentration for many patients. However, some  are best treated at concentrations outside of this range. Acetaminophen concentrations >150 ug/mL at 4 hours after ingestion  and >50 ug/mL at 12 hours after ingestion are often associated with  toxic reactions.  Performed at [facility], [address] [place], [place] [address]   CULTURE, BLOOD (ROUTINE X 2) w Reflex to ID Panel     Status: None (Preliminary result)   Collection Time: 12/11/20 12:20 PM   Specimen: BLOOD  Result Value Ref Range   Specimen Description BLOOD BLOOD RIGHT FOREARM    Special Requests      BOTTLES DRAWN AEROBIC AND ANAEROBIC Blood Culture adequate volume   Culture      NO GROWTH < 24 HOURS Performed at [facility], [address] [place], [place] [address]    Report Status PENDING   CULTURE, BLOOD (ROUTINE X 2) w Reflex to ID Panel     Status: None (Preliminary result)   Collection Time: 12/11/20 12:20 PM   Specimen: BLOOD  Result Value Ref Range   Specimen Description BLOOD RIGHT ANTECUBITAL    Special Requests      BOTTLES DRAWN AEROBIC AND ANAEROBIC Blood Culture adequate volume   Culture      NO GROWTH < 24 HOURS Performed at [facility], [address] [place], [place] [address]    Report Status PENDING   Urine Drug Screen, Qualitative     Status: Abnormal   Collection Time: 12/11/20  9:10 PM  Result Value Ref Range   Tricyclic, Ur Screen NONE DETECTED NONE DETECTED   Amphetamines, Ur Screen NONE DETECTED NONE DETECTED   MDMA (Ecstasy)Ur Screen NONE DETECTED NONE DETECTED   Cocaine Metabolite,Ur [place] NONE DETECTED NONE DETECTED   Opiate, Ur Screen NONE DETECTED NONE DETECTED   Phencyclidine (PCP) Ur S POSITIVE (A) NONE DETECTED   Cannabinoid 50 Ng, Ur [place] NONE DETECTED  NONE DETECTED   Barbiturates, Ur Screen NONE DETECTED NONE DETECTED   Benzodiazepine, Ur Scrn POSITIVE (A) NONE DETECTED   Methadone Scn, Ur NONE DETECTED NONE DETECTED    Comment: (NOTE) Tricyclics + metabolites, urine    Cutoff 1000 ng/mL Amphetamines + metabolites, urine  Cutoff 1000 ng/mL MDMA (Ecstasy), urine              Cutoff 500 ng/mL Cocaine Metabolite, urine          Cutoff 300 ng/mL Opiate + metabolites, urine        Cutoff 300 ng/mL Phencyclidine (PCP), urine         Cutoff 25 ng/mL Cannabinoid, urine                 Cutoff 50 ng/mL Barbiturates + metabolites, urine  Cutoff 200 ng/mL Benzodiazepine, urine              Cutoff 200 ng/mL Methadone, urine                   Cutoff 300 ng/mL  The urine drug screen provides only a preliminary, unconfirmed analytical test result and should not be used for non-medical purposes. Clinical consideration and professional judgment should be applied to any positive drug screen result due to possible interfering substances. A more specific alternate chemical method must be used in order to obtain a confirmed analytical result.  Gas chromatography / mass spectrometry (GC/MS) is the preferred confirm atory method. Performed at [facility], [address] [place], [place] [address]   Strep pneumoniae urinary antigen     Status: None   Collection Time: 12/11/20  9:10 PM  Result Value Ref Range   Strep Pneumo Urinary Antigen NEGATIVE NEGATIVE    Comment:        Infection due to S. pneumoniae cannot be absolutely ruled out since the antigen present may be below the detection limit of the test. Performed at [facility], [address] [place], [place] [address]   HIV Antibody (routine testing w rflx)     Status: None   Collection Time: 12/12/20  5:56 AM  Result Value Ref Range   HIV Screen 4th Generation wRfx Non Reactive Non Reactive    Comment: Performed at [facility], [place] [address], [place], [place]  [address]  CBC     Status: Abnormal   Collection Time: 12/12/20  5:56 AM  Result Value Ref Range   WBC 19.3 (H) 4.0 - 10.5 K/uL   RBC 4.91 4.22 - 5.81 MIL/uL   Hemoglobin 15.7 13.0 - 17.0 g/dL   HCT 46.9 39.0 - 52.0 %   MCV 95.5 80.0 - 100.0 fL   MCH 32.0 26.0 - 34.0 pg   MCHC 33.5 30.0 - 36.0 g/dL   RDW 13.2 11.5 - 15.5 %   Platelets 210 150 - 400 K/uL   nRBC 0.0 0.0 - 0.2 %    Comment: Performed at [facility], [address] [place], [place] [id number]  Basic metabolic panel     Status: Abnormal   Collection Time: 12/12/20  5:56 AM  Result Value Ref Range   Sodium 142 135 - 145 mmol/L   Potassium 4.4 3.5 - 5.1 mmol/L   Chloride 108 98 - 111 mmol/L   CO2 24 22 - 32 mmol/L   Glucose, Bld 139 (H) 70 - 99 mg/dL    Comment: Glucose reference range applies only to samples taken after fasting for at least 8 hours.   BUN 24 (H) 8 - 23 mg/dL   Creatinine, Ser 1.08 0.61 - 1.24 mg/dL   Calcium 9.7 8.9 - 10.3 mg/dL   GFR, Estimated >60 >60 mL/min    Comment: (NOTE) Calculated using the CKD-EPI Creatinine Equation (2021)    Anion gap 10 5 - 15    Comment: Performed at [facility], [address] [place], [place] [address]    Current Facility-Administered Medications  Medication Dose Route Frequency Provider Last Rate Last Admin  . acetaminophen (TYLENOL) tablet 650 mg  650 mg Oral Q6H PRN Ivor Costa, MD      . albuterol (VENTOLIN HFA) 108 (90 Base) MCG/ACT inhaler 2 puff  2 puff Inhalation Q4H PRN Ivor Costa, MD      . apixaban [name]) tablet 5 mg  5 mg Oral BID Ivor Costa, MD   5 mg at 12/12/20 1013  . aspirin EC tablet 81 mg  81 mg Oral Daily Ivor Costa, MD   81 mg at 12/12/20 1013  . azithromycin (ZITHROMAX) 500 mg in sodium chloride 0.9 % 250 mL IVPB  500 mg Intravenous Q24H Ivor Costa, MD 250 mL/hr at 12/12/20 1317 500 mg at 12/12/20 1317  . carvedilol (COREG) tablet 25 mg  25 mg Oral BID WC Ivor Costa, MD   25 mg at 12/12/20 0816  . cefTRIAXone (ROCEPHIN) 1  g in  sodium chloride 0.9 % 100 mL IVPB  1 g Intravenous Q24H Ivor Costa, MD 200 mL/hr at 12/12/20 1139 1 g at 12/12/20 1139  . cyproheptadine (PERIACTIN) 4 MG tablet 4 mg  4 mg Oral BID Ivor Costa, MD   4 mg at 12/11/20 2157  . ezetimibe (ZETIA) tablet 10 mg  10 mg Oral Daily Ivor Costa, MD   10 mg at 12/12/20 1116  . feeding supplement (ENSURE ENLIVE / ENSURE PLUS) liquid 237 mL  237 mL Oral BID BM Carrie Mew, MD      . furosemide (LASIX) tablet 40 mg  40 mg Oral Daily Ivor Costa, MD   40 mg at 12/12/20 1013  . guaiFENesin-dextromethorphan (ROBITUSSIN DM) 100-10 MG/5ML syrup 5 mL  5 mL Oral Q4H PRN Sharion Settler, NP   5 mL at 12/11/20 2326  . hydrALAZINE (APRESOLINE) injection 5 mg  5 mg Intravenous Q2H PRN Ivor Costa, MD      . lidocaine (LIDODERM) 5 % 1 patch  1 patch Transdermal Q24H Ivor Costa, MD   1 patch at 12/11/20 [phone]  . LORazepam (ATIVAN) tablet 1 mg  1 mg Oral Q6H PRN Ivor Costa, MD      . nitroGLYCERIN (NITROSTAT) SL tablet 0.4 mg  0.4 mg Sublingual Q5 min PRN Ivor Costa, MD      . promethazine (PHENERGAN) injection 12.5 mg  12.5 mg Intravenous Q8H PRN Ivor Costa, MD      . rosuvastatin (CRESTOR) tablet 40 mg  40 mg Oral Daily Ivor Costa, MD   40 mg at 12/12/20 1116   Current Outpatient Medications  Medication Sig Dispense Refill  . ANUCORT-HC 25 MG suppository Place 25 mg rectally 2 (two) times daily.    Marland Kitchen aspirin EC 81 MG tablet Take 1 tablet (81 mg total) by mouth daily. 90 tablet 3  . carvedilol (COREG) 25 MG tablet Take 1 tablet (25 mg total) by mouth 2 (two) times daily with a meal. 60 tablet 3  . ELIQUIS 5 MG TABS tablet TAKE ONE TABLET TWICE DAILY 60 tablet 1  . ezetimibe (ZETIA) 10 MG tablet TAKE ONE (1) TABLET EACH DAY 30 tablet 3  . FLUoxetine (PROZAC) 40 MG capsule Take 1 capsule (40 mg total) by mouth at bedtime. 30 capsule 1  . gabapentin (NEURONTIN) 400 MG capsule Take 400 mg by mouth 3 (three) times daily.     . mirtazapine (REMERON) 15 MG tablet Take 1  tablet (15 mg total) by mouth at bedtime.    . nitroGLYCERIN (NITROSTAT) 0.4 MG SL tablet Place 1 tablet (0.4 mg total) under the tongue every 5 (five) minutes as needed for chest pain. 25 tablet 3  . QC STOOL SOFTENER PLS LAXATIVE 8.6-50 MG tablet Take 2 tablets by mouth daily.    . rosuvastatin (CRESTOR) 40 MG tablet Take 1 tablet (40 mg total) by mouth daily. 90 tablet 3  . temazepam (RESTORIL) 15 MG capsule Take 1 capsule (15 mg total) by mouth at bedtime. 30 capsule 1  . traMADol (ULTRAM) 50 MG tablet Take 50-100 mg by mouth 2 (two) times daily as needed for moderate pain.      Musculoskeletal: Strength & Muscle Tone: within normal limits Gait & Station: unsteady Patient leans: N/A  Psychiatric Specialty Exam: Physical Exam Vitals and nursing note reviewed.  Constitutional:      Appearance: He is well-developed and well-nourished.  HENT:     Head: Normocephalic and atraumatic.  Eyes:     Conjunctiva/sclera: Conjunctivae  normal.     Pupils: Pupils are equal, round, and reactive to light.  Cardiovascular:     Heart sounds: Normal heart sounds.  Pulmonary:     Effort: Pulmonary effort is normal.  Abdominal:     Palpations: Abdomen is soft.  Musculoskeletal:        General: Normal range of motion.     Cervical back: Normal range of motion.  Skin:    General: Skin is warm and dry.  Neurological:     Mental Status: He is alert.     Motor: Tremor present.  Psychiatric:        Attention and Perception: He is inattentive.        Mood and Affect: Mood is anxious.        Speech: Speech is delayed.        Behavior: Behavior is slowed.        Thought Content: Thought content does not include homicidal or suicidal ideation.        Cognition and Memory: Cognition is impaired. Memory is impaired.        Judgment: Judgment is impulsive.     Review of Systems  Constitutional: Negative.   HENT: Negative.   Eyes: Negative.   Respiratory: Negative.   Cardiovascular: Negative.    Gastrointestinal: Negative.   Musculoskeletal: Negative.   Skin: Negative.   Neurological: Positive for tremors.  Psychiatric/Behavioral: Positive for behavioral problems. The patient is nervous/anxious.     Blood pressure (!) 147/115, pulse 91, temperature 98.2 F (36.8 C), temperature source Oral, resp. rate (!) 24, height 5\' 11"  (1.803 m), weight 103.9 kg, SpO2 90 %.Body mass index is 31.94 kg/m.  General Appearance: Casual  Eye Contact:  Fair  Speech:  Slow  Volume:  Decreased  Mood:  Depressed  Affect:  Congruent  Thought Process:  Goal Directed  Orientation:  Full (Time, Place, and Person)  Thought Content:  Logical  Suicidal Thoughts:  Yes.  without intent/plan  Homicidal Thoughts:  No  Memory:  Immediate;   Fair Recent;   Fair Remote;   Fair  Judgement:  Impaired  Insight:  Shallow  Psychomotor Activity:  Decreased and Tremor  Concentration:  Concentration: Fair  Recall:  [employer] of Knowledge:  Fair  Language:  Fair  Akathisia:  No  Handed:  Right  AIMS (if indicated):     Assets:  Desire for Improvement Housing Resilience Social Support  ADL's:  Impaired  Cognition:  Impaired,  Mild  Sleep:        Treatment Plan Summary: Medication management and Plan Patient continues to exhibit some signs of serotonin syndrome with elevated blood pressure and tremulousness but seems calmer than yesterday.  Reports no active suicidal ideation.  Affect brighter.  [employer] about seriousness of depression.  Continue to recommend avoiding restarting any antidepressant medication.  We will continue to follow as a consult and can plan for admission once he is fully medically stable  Disposition: Recommend psychiatric Inpatient admission when medically cleared.  Alethia Berthold, MD 12/12/2020 1:18 PM

## 2020-12-12 NOTE — Progress Notes (Addendum)
PROGRESS NOTE  Jeremy Reese KXF:[id number] DOB: 08/25/1951 DOA: 12/11/2020 PCP: Cipriano Mile, NP  HPI/Recap of past 24 hours: Patient is 70 year old male with past medical history of depression, hypertension, obesity, diastolic CHF, CAD status post CABG and PAD who early morning of 1/13 took 24 Prozac 40 mg pills and 15 Remeron 15 mg pills with the intent to try to kill himself.  He started feeling groggy and called EMS.  Patient was brought in and evaluation noted hypoxia on room air and 91% on 2 L nasal cannula.  Chest x-ray noted multifocal infiltrates, but negative COVID test and patient put on antibiotics.  He was noted to be tremulous and given his intentional overdose, concerns for serotonin syndrome.  Discussed with poison control and psychiatry and patient treated with Ativan.  Admitted to the hospitalist service.  Placed with sitter.  No events overnight.  As patient became more alert by following day today, oxygen saturation improved.  Currently at 96% on room air.  He is still anxious and tremulous though.  Assessment/Plan: Principal Problem: Depression with suicide attempt of overdose overdose of benzodiazepines and SSRI causing acute respiratory failure with hypoxia: Now with serotonin syndrome.  Monitoring on telemetry.  Check EKG. Active Problems:   Hyperlipidemia   Chronic atrial fibrillation [facility]): EKG on admission noted to be in atrial fibrillation.  Rate controlled.  Stable.  Continue Eliquis.    Chronic diastolic congestive heart failure ([place]): BNP on admission minimally elevated. Repeat BNP much higher at almost 600. Have started IV Lasix    CAD, multiple vessel with history of stent placement and CABG: Stable.    Hypertension: Blood pressures have been trending upwards.  May be in part due to serotonin syndrome versus volume overload   History of TIA (transient ischemic attack)   Spinal stenosis of lumbar region without neurogenic claudication  Abnormal chest  x-ray: Thought to be pneumonia, but patient does not have pneumonia. Repeat procalcitonin still normal. White blood cell count increased to 19.3, but that may have been in part due to stress margination from CHF or overdose. Have stopped antibiotics    Obesity (BMI 30-39.9): Meets criteria BMI greater than 30.   Code Status: Full code  Family Communication: No family due to suicidal status  Disposition Plan: Now that he is no longer hypoxic and we will confirm no further signs of infection, stabilizing.  Once blood pressure is better and serotonin syndrome felt to have passed, he will need inpatient psychiatry admission   Consultants:  Psychiatry  Procedures:  None  Antimicrobials:  IV Rocephin and Zithromax 1/13-1/14  DVT prophylaxis: Eliquis   Objective: Vitals:   12/12/20 0811 12/12/20 1452  BP: (!) 147/115 (!) 134/101  Pulse: 91 98  Resp: (!) 24   Temp: 98.2 F (36.8 C) 98 F (36.7 C)  SpO2: 90% 96%   No intake or output data in the 24 hours ending 12/12/20 1546 Filed Weights   12/11/20 0621  Weight: 103.9 kg   Body mass index is 31.94 kg/m.  Exam:   General: Alert and oriented x3, anxious  Cardiovascular: Irregular rhythm, rate controlled  Respiratory: Clear to auscultation bilaterally  Abdomen: Soft, nontender, nondistended, positive bowel sounds  Musculoskeletal: No clubbing or cyanosis, trace pitting edema  Skin: No skin breaks, tears or lesions  Psychiatry: Appropriate, no evidence of psychoses.  Poor insight into what has occurred.  Anxious.   Data Reviewed: CBC: Recent Labs  Lab 12/11/20 0632 12/12/20 0556  WBC 8.5 19.3*  NEUTROABS 6.2  --   HGB 14.8 15.7  HCT 45.7 46.9  MCV 97.9 95.5  PLT 202 [id number]   Basic Metabolic Panel: Recent Labs  Lab 12/11/20 0632 12/12/20 0556  NA 137 142  K 4.0 4.4  CL 102 108  CO2 24 24  GLUCOSE 157* 139*  BUN 16 24*  CREATININE 0.94 1.08  CALCIUM 9.6 9.7  MG 2.3  --    GFR: Estimated  Creatinine Clearance: 80.3 mL/min (by C-G formula based on SCr of 1.08 mg/dL). Liver Function Tests: Recent Labs  Lab 12/11/20 0632  AST 26  ALT 31  ALKPHOS 57  BILITOT 1.1  PROT 7.1  ALBUMIN 4.1   No results for input(s): LIPASE, AMYLASE in the last 168 hours. No results for input(s): AMMONIA in the last 168 hours. Coagulation Profile: No results for input(s): INR, PROTIME in the last 168 hours. Cardiac Enzymes: No results for input(s): CKTOTAL, CKMB, CKMBINDEX, TROPONINI in the last 168 hours. BNP (last 3 results) No results for input(s): PROBNP in the last 8760 hours. HbA1C: No results for input(s): HGBA1C in the last 72 hours. CBG: Recent Labs  Lab 12/11/20 0631  GLUCAP 163*   Lipid Profile: No results for input(s): CHOL, HDL, LDLCALC, TRIG, CHOLHDL, LDLDIRECT in the last 72 hours. Thyroid Function Tests: No results for input(s): TSH, T4TOTAL, FREET4, T3FREE, THYROIDAB in the last 72 hours. Anemia Panel: No results for input(s): VITAMINB12, FOLATE, FERRITIN, TIBC, IRON, RETICCTPCT in the last 72 hours. Urine analysis:    Component Value Date/Time   COLORURINE YELLOW (A) 11/11/2020 1314   APPEARANCEUR CLEAR (A) 11/11/2020 1314   LABSPEC 1.018 11/11/2020 1314   PHURINE 6.0 11/11/2020 1314   GLUCOSEU NEGATIVE 11/11/2020 1314   HGBUR NEGATIVE 11/11/2020 1314   BILIRUBINUR NEGATIVE 11/11/2020 1314   KETONESUR NEGATIVE 11/11/2020 1314   PROTEINUR 30 (A) 11/11/2020 1314   NITRITE NEGATIVE 11/11/2020 1314   LEUKOCYTESUR NEGATIVE 11/11/2020 1314   Sepsis Labs: @LABRCNTIP (procalcitonin:4,lacticidven:4)  ) Recent Results (from the past 240 hour(s))  Resp Panel by RT-PCR (Flu A&B, Covid) Nasopharyngeal Swab     Status: None   Collection Time: 12/11/20  6:53 AM   Specimen: Nasopharyngeal Swab; Nasopharyngeal(NP) swabs in vial transport medium  Result Value Ref Range Status   SARS Coronavirus 2 by RT PCR NEGATIVE NEGATIVE Final    Comment: (NOTE) SARS-CoV-2 target  nucleic acids are NOT DETECTED.  The SARS-CoV-2 RNA is generally detectable in upper respiratory specimens during the acute phase of infection. The lowest concentration of SARS-CoV-2 viral copies this assay can detect is 138 copies/mL. A negative result does not preclude SARS-Cov-2 infection and should not be used as the sole basis for treatment or other patient management decisions. A negative result may occur with  improper specimen collection/handling, submission of specimen other than nasopharyngeal swab, presence of viral mutation(s) within the areas targeted by this assay, and inadequate number of viral copies(<138 copies/mL). A negative result must be combined with clinical observations, patient history, and epidemiological information. The expected result is Negative.  Fact Sheet for Patients:  EntrepreneurPulse.com.au  Fact Sheet for Healthcare Providers:  IncredibleEmployment.be  This test is no t yet approved or cleared by the [place] FDA and  has been authorized for detection and/or diagnosis of SARS-CoV-2 by FDA under an Emergency Use Authorization (EUA). This EUA will remain  in effect (meaning this test can be used) for the duration of the COVID-19 declaration under Section 564(b)(1) of the Act, 21 U.S.C.section 360bbb-3(b)(1), unless  the authorization is terminated  or revoked sooner.       Influenza A by PCR NEGATIVE NEGATIVE Final   Influenza B by PCR NEGATIVE NEGATIVE Final    Comment: (NOTE) The Xpert Xpress SARS-CoV-2/FLU/RSV plus assay is intended as an aid in the diagnosis of influenza from Nasopharyngeal swab specimens and should not be used as a sole basis for treatment. Nasal washings and aspirates are unacceptable for Xpert Xpress SARS-CoV-2/FLU/RSV testing.  Fact Sheet for Patients: EntrepreneurPulse.com.au  Fact Sheet for Healthcare  Providers: IncredibleEmployment.be  This test is not yet approved or cleared by the [place] FDA and has been authorized for detection and/or diagnosis of SARS-CoV-2 by FDA under an Emergency Use Authorization (EUA). This EUA will remain in effect (meaning this test can be used) for the duration of the COVID-19 declaration under Section 564(b)(1) of the Act, 21 U.S.C. section 360bbb-3(b)(1), unless the authorization is terminated or revoked.  Performed at [facility], [address] [place], [place] [address]   CULTURE, BLOOD (ROUTINE X 2) w Reflex to ID Panel     Status: None (Preliminary result)   Collection Time: 12/11/20 12:20 PM   Specimen: BLOOD  Result Value Ref Range Status   Specimen Description BLOOD BLOOD RIGHT FOREARM  Final   Special Requests   Final    BOTTLES DRAWN AEROBIC AND ANAEROBIC Blood Culture adequate volume   Culture   Final    NO GROWTH < 24 HOURS Performed at [facility], [address] [place],  [address]    Report Status PENDING  Incomplete  CULTURE, BLOOD (ROUTINE X 2) w Reflex to ID Panel     Status: None (Preliminary result)   Collection Time: 12/11/20 12:20 PM   Specimen: BLOOD  Result Value Ref Range Status   Specimen Description BLOOD RIGHT ANTECUBITAL  Final   Special Requests   Final    BOTTLES DRAWN AEROBIC AND ANAEROBIC Blood Culture adequate volume   Culture   Final    NO GROWTH < 24 HOURS Performed at [facility], [address] [name],  [address]    Report Status PENDING  Incomplete      Studies: No results found.  Scheduled Meds: . apixaban  5 mg Oral BID  . aspirin EC  81 mg Oral Daily  . carvedilol  25 mg Oral BID WC  . cyproheptadine  4 mg Oral BID  . ezetimibe  10 mg Oral Daily  . feeding supplement  237 mL Oral BID BM  . furosemide  40 mg Oral Daily  . lidocaine  1 patch Transdermal Q24H  . rosuvastatin  40 mg Oral Daily    Continuous  Infusions: . azithromycin 500 mg (12/12/20 1317)  . cefTRIAXone (ROCEPHIN)  IV 1 g (12/12/20 1139)     LOS: 0 days     Annita Brod, MD [facility]   12/12/2020, 3:46 PM

## 2020-12-13 LAB — PHOSPHORUS: Phosphorus: 3.9 mg/dL (ref 2.5–4.6)

## 2020-12-13 LAB — CBC
HCT: 45.7 % (ref 39.0–52.0)
HCT: 48.5 % (ref 39.0–52.0)
Hemoglobin: 14.7 g/dL (ref 13.0–17.0)
Hemoglobin: 15.8 g/dL (ref 13.0–17.0)
MCH: 31.7 pg (ref 26.0–34.0)
MCH: 31.5 pg (ref 26.0–34.0)
MCHC: 32.2 g/dL (ref 30.0–36.0)
MCHC: 32.6 g/dL (ref 30.0–36.0)
MCV: 98.7 fL (ref 80.0–100.0)
MCV: 96.6 fL (ref 80.0–100.0)
Platelets: 168 10*3/uL (ref 150–400)
Platelets: 184 10*3/uL (ref 150–400)
RBC: 4.63 MIL/uL (ref 4.22–5.81)
RBC: 5.02 MIL/uL (ref 4.22–5.81)
RDW: 13.8 % (ref 11.5–15.5)
RDW: 13.4 % (ref 11.5–15.5)
WBC: 13.5 10*3/uL — ABNORMAL HIGH (ref 4.0–10.5)
WBC: 13.6 10*3/uL — ABNORMAL HIGH (ref 4.0–10.5)
nRBC: 0 % (ref 0.0–0.2)
nRBC: 0 % (ref 0.0–0.2)

## 2020-12-13 LAB — MAGNESIUM: Magnesium: 2.3 mg/dL (ref 1.7–2.4)

## 2020-12-13 LAB — COMPREHENSIVE METABOLIC PANEL
ALT: 133 U/L — ABNORMAL HIGH (ref 0–44)
AST: 137 U/L — ABNORMAL HIGH (ref 15–41)
Albumin: 4.5 g/dL (ref 3.5–5.0)
Alkaline Phosphatase: 66 U/L (ref 38–126)
Anion gap: 14 (ref 5–15)
BUN: 40 mg/dL — ABNORMAL HIGH (ref 8–23)
CO2: 28 mmol/L (ref 22–32)
Calcium: 10 mg/dL (ref 8.9–10.3)
Chloride: 100 mmol/L (ref 98–111)
Creatinine, Ser: 1.29 mg/dL — ABNORMAL HIGH (ref 0.61–1.24)
GFR, Estimated: >60 mL/min (ref >60)
Glucose, Bld: 102 mg/dL — ABNORMAL HIGH (ref 70–99)
Potassium: 3.9 mmol/L (ref 3.5–5.1)
Sodium: 142 mmol/L (ref 135–145)
Total Bilirubin: 1.3 mg/dL — ABNORMAL HIGH (ref 0.3–1.2)
Total Protein: 7.5 g/dL (ref 6.5–8.1)

## 2020-12-13 LAB — BASIC METABOLIC PANEL
Anion gap: 12 (ref 5–15)
BUN: 34 mg/dL — ABNORMAL HIGH (ref 8–23)
CO2: 28 mmol/L (ref 22–32)
Calcium: 9.8 mg/dL (ref 8.9–10.3)
Chloride: 103 mmol/L (ref 98–111)
Creatinine, Ser: 1.2 mg/dL (ref 0.61–1.24)
GFR, Estimated: >60 mL/min (ref >60)
Glucose, Bld: 108 mg/dL — ABNORMAL HIGH (ref 70–99)
Potassium: 4.6 mmol/L (ref 3.5–5.1)
Sodium: 143 mmol/L (ref 135–145)

## 2020-12-13 MED ORDER — THIAMINE HCL 100 MG PO TABS
100.0000 mg | ORAL_TABLET | Freq: Every day | ORAL | Status: DC
  Administered 2020-12-14 – 2020-12-16 (×3): 100 mg via ORAL
  Filled 2020-12-13 (×3): qty 1

## 2020-12-13 MED ORDER — HALOPERIDOL LACTATE 5 MG/ML IJ SOLN
2.0000 mg | Freq: Once | INTRAMUSCULAR | Status: AC
  Administered 2020-12-13: 2 mg via INTRAMUSCULAR
  Filled 2020-12-13: qty 1

## 2020-12-13 MED ORDER — THIAMINE HCL 100 MG/ML IJ SOLN: 100.0000 mg | Freq: Every day | INTRAMUSCULAR | Status: DC

## 2020-12-13 MED ORDER — FOLIC ACID 1 MG PO TABS
1.0000 mg | ORAL_TABLET | Freq: Every day | ORAL | Status: DC
  Administered 2020-12-13 – 2020-12-16 (×4): 1 mg via ORAL
  Filled 2020-12-13 (×4): qty 1

## 2020-12-13 MED ORDER — ADULT MULTIVITAMIN W/MINERALS CH
1.0000 | ORAL_TABLET | Freq: Every day | ORAL | Status: DC
  Administered 2020-12-14 – 2020-12-16 (×3): 1 via ORAL
  Filled 2020-12-13 (×3): qty 1

## 2020-12-13 MED ORDER — LORAZEPAM 2 MG/ML IJ SOLN
1.0000 mg | INTRAMUSCULAR | Status: AC | PRN
  Administered 2020-12-13: 3 mg via INTRAVENOUS
  Filled 2020-12-13 (×2): qty 1

## 2020-12-13 MED ORDER — LORAZEPAM 2 MG/ML IJ SOLN
2.0000 mg | Freq: Once | INTRAMUSCULAR | Status: AC
  Administered 2020-12-13: 2 mg via INTRAMUSCULAR
  Filled 2020-12-13: qty 1

## 2020-12-13 MED ORDER — LORAZEPAM 2 MG PO TABS: 2.0000 mg | ORAL_TABLET | Freq: Once | ORAL | Status: AC

## 2020-12-13 MED ORDER — HALOPERIDOL 2 MG PO TABS
2.0000 mg | ORAL_TABLET | Freq: Once | ORAL | Status: AC
  Filled 2020-12-13: qty 1

## 2020-12-13 MED ORDER — LORAZEPAM 1 MG PO TABS
1.0000 mg | ORAL_TABLET | ORAL | Status: AC | PRN
  Administered 2020-12-15 (×2): 1 mg via ORAL
  Filled 2020-12-13: qty 1

## 2020-12-13 NOTE — Progress Notes (Signed)
Initial Nutrition Assessment  DOCUMENTATION CODES:   Obesity unspecified  INTERVENTION:  Continue Ensure Enlive po BID, each supplement provides 350 kcal and 20 grams of protein (chocolate)  MVI with minerals daily   NUTRITION DIAGNOSIS:   Inadequate oral intake related to poor appetite,social / environmental circumstances as evidenced by percent weight loss,per patient/family report.  GOAL:   Patient will meet greater than or equal to 90% of their needs    MONITOR:   PO intake,Labs,I & O's,Supplement acceptance,Weight trends  REASON FOR ASSESSMENT:   Malnutrition Screening Tool    ASSESSMENT:   70 year old male admitted for acute respiratory failure with hypoxia secondary to intentional overdose presented tremulous and groggy after taking 24 Prozac and 15 Remeron pills. Past medical history significant of atrial fibrillation, chronic dCHF, CAD s/p CABG x3, calcium pyrophosphate deposition disease, TIA, HLD, spinal stenosis of lumbar region, and severe depression with prior suicide attempts.  Plans for psychiatric inpatient admission when medically cleared.   Briefly spoke with pt via phone, he reports enjoying his breakfast, recalls scrambeld eggs, grits, and [place] sausage which he states was excellent. He has not been eating much lately. Per chart weights have trended down over the past 3 months. On 10/821 he weighted 116.1 kg (255.42 lbs), on 10/29/20 weights decreased to 106.6 kg (234.52 lbs), on 12/03/20 he weighed 104.8 kg (230.12 lbs) and currently he weighs 103.9 kg (228.58 lbs). This indicates ~27 lb (10.6%) wt loss over the past 3 months which is significant for time frame. Highly suspect malnutrition, however unable to identify without performing exam. Will plan to complete at follow-up as able. Pt is ordered Ensure BID, says he will drink them and prefers the chocolate flavor.   Meal intake improved, pt consumed 100% of breakfast tray. Noted 0% po x 2 documented meals  on 1/13 and 1/14.  Medications reviewed and include: Periactin, Zetia, Lasix 20 mg IV twice daily  Labs: BUN 34 (H), WBC 13.5 (H), BNP 565.7 (H)  NUTRITION - FOCUSED PHYSICAL EXAM:  Unable to complete at this time  Diet Order:   Diet Order            Diet Heart Room service appropriate? Yes; Fluid consistency: Thin  Diet effective now                 EDUCATION NEEDS:   Not appropriate for education at this time  Skin:  Skin Assessment: Reviewed RN Assessment  Last BM:  1/14 type 3  Height:   Ht Readings from Last 1 Encounters:  12/11/20 5\' 11"  (1.803 m)    Weight:   Wt Readings from Last 1 Encounters:  12/11/20 103.9 kg    BMI:  Body mass index is 31.94 kg/m.  Estimated Nutritional Needs:   Kcal:  2400-2600  Protein:  120-135  Fluid:  >/= 2.4 L   Lajuan Lines, RD, LDN Clinical Nutrition After Hours/Weekend Pager # in [place]

## 2020-12-13 NOTE — Progress Notes (Addendum)
PROGRESS NOTE  Jeremy Reese [id number] DOB: September 22, 1951 DOA: 12/11/2020 PCP: Cipriano Mile, NP  HPI/Recap of past 24 hours: Patient is 70 year old male with past medical history of depression, hypertension, obesity, diastolic CHF, CAD status post CABG and PAD who early morning of 1/13 took 24 Prozac 40 mg pills and 15 Remeron 15 mg pills with the intent to try to kill himself.  He started feeling groggy and called EMS.  Patient was brought in and evaluation noted hypoxia on room air and 91% on 2 L nasal cannula.  Chest x-ray noted multifocal infiltrates, but negative COVID test and patient put on antibiotics.  He was noted to be tremulous and given his intentional overdose, concerns for serotonin syndrome.  Discussed with poison control and psychiatry and patient treated with Ativan.  Admitted to the hospitalist service.  Placed with sitter.  No events overnight.  As patient became more alert by following day today, oxygen saturation improved.  Currently at 96% on room air.  He is still anxious and tremulous though.  12/13/20:  Seen and examined.  At the time of this visit he is calm and answering all questions appropriately.  Tremors were noted on exam.  Later, around noon bedside RN reports increased confusion, agitation and aggressive behavior.  I dose haldol given.    Assessment/Plan: Principal Problem:   Overdose Active Problems:   Hyperlipidemia   Chronic atrial fibrillation (HCC)   Chronic diastolic congestive heart failure (HCC)   CAD, multiple vessel   Hypertension   History of TIA (transient ischemic attack)   Spinal stenosis of lumbar region without neurogenic claudication   Depression   Suicide attempt ([place])   Serotonin syndrome   Paroxysmal A-fib (HCC)   Obesity (BMI 30-39.9)   Acute on chronic respiratory failure with hypoxia (HCC)  Depression with suicide attempt of overdose overdose of benzodiazepines and SSRI causing acute respiratory failure with hypoxia:  Now with serotonin syndrome.   Monitoring on telemetry Psych following Continue 1-to-1 sitter  Acute metabolic encephalopathy, suspect delirium This AM A&O x 4 Early this afternoon increase in confusion, agitation and aggressive behavior 1 dose of IM haldol 2 mg given Delirium precautions  Chronic atrial fibrillation ([place]):  EKG on admission noted to be in atrial fibrillation.   Rate controlled on coreg.   Continue Eliquis for CVA prevention  Chronic diastolic congestive heart failure ([place]):  BNP on admission minimally elevated. Repeat BNP much higher at almost 600. Have started IV Lasix Ongoing diuresing with IV lasix 20 mg BID Net I&O -1.4L  CAD, multiple vessel with history of stent placement and CABG:  Denies any anginal symptoms at the time of this exam  Hypertension: BP is not at goal Blood pressures have been trending upwards.  May be in part due to serotonin syndrome versus volume overload   History of TIA (transient ischemic attack)  Spinal stenosis of lumbar region without neurogenic claudication  Abnormal chest x-ray: Thought to be pneumonia, but patient does not have pneumonia. Repeat procalcitonin still normal. White blood cell count increased to 19.3, but that may have been in part due to stress margination from CHF or overdose.  Antibiotics were stopped.  Obesity (BMI 30-39.9): Meets criteria BMI greater than 30.  Chronic insomnia/anxiety Per his wife he was taking xanax qhs  Has hx of chronic insomnia and anxiety  Polysubstance abuse including PCP and benzodiazepine with concern for benzodiazepine withdrawal UDS positive for benzodiazepines and PCP Agitated and aggressive behavior on 12/13/20 CIWA protocol  in place    Code Status: Full code  Family Communication: No family due to suicidal status  Disposition Plan: Inpt psych   Consultants:  Psychiatry  Procedures:  None  Antimicrobials:  IV Rocephin and Zithromax  1/13-1/14  DVT prophylaxis: Eliquis    Status is: Inpatient   Dispo:  Patient From: Home  Planned Disposition: Geri-Psych  Expected discharge date: 12/16/2020  Medically stable for discharge: No, ongoing management of serotonin syndrome.         Objective: Vitals:   12/12/20 1452 12/12/20 1653 12/12/20 2127 12/13/20 0614  BP: (!) 134/101 (!) 166/93 136/88 (!) 159/117  Pulse: 98 90 74 89  Resp:  18 20 20   Temp: 98 F (36.7 C) 98.4 F (36.9 C) 99.1 F (37.3 C) 99.5 F (37.5 C)  TempSrc: Oral Oral Oral Oral  SpO2: 96% 96% 96% 97%  Weight:      Height:        Intake/Output Summary (Last 24 hours) at 12/13/2020 1459 Last data filed at 12/13/2020 0900 Gross per 24 hour  Intake 440 ml  Output 1876 ml  Net -1436 ml   Filed Weights   12/11/20 0621  Weight: 103.9 kg    Exam:  . General: 70 y.o. year-old male well developed well nourished in no acute distress.  Alert and oriented x3.  Tremors noted on exam. . Cardiovascular: Regular rate and rhythm with no rubs or gallops.  No thyromegaly or JVD noted.   Marland Kitchen Respiratory: Clear to auscultation with no wheezes or rales. Good inspiratory effort. . Abdomen: Soft nontender nondistended with normal bowel sounds x4 quadrants. . Musculoskeletal: No lower extremity edema. 2/4 pulses in all 4 extremities. . Skin: No ulcerative lesions noted or rashes, . Psychiatry: Mood is appropriate for condition and setting   Data Reviewed: CBC: Recent Labs  Lab 12/11/20 0632 12/12/20 0556 12/13/20 0605  WBC 8.5 19.3* 13.5*  NEUTROABS 6.2  --   --   HGB 14.8 15.7 14.7  HCT 45.7 46.9 45.7  MCV 97.9 95.5 98.7  PLT 202 210 016   Basic Metabolic Panel: Recent Labs  Lab 12/11/20 0632 12/12/20 0556 12/13/20 0605  NA 137 142 143  K 4.0 4.4 4.6  CL 102 108 103  CO2 24 24 28   GLUCOSE 157* 139* 108*  BUN 16 24* 34*  CREATININE 0.94 1.08 1.20  CALCIUM 9.6 9.7 9.8  MG 2.3  --   --    GFR: Estimated Creatinine Clearance:  72.3 mL/min (by C-G formula based on SCr of 1.2 mg/dL). Liver Function Tests: Recent Labs  Lab 12/11/20 0632  AST 26  ALT 31  ALKPHOS 57  BILITOT 1.1  PROT 7.1  ALBUMIN 4.1   No results for input(s): LIPASE, AMYLASE in the last 168 hours. No results for input(s): AMMONIA in the last 168 hours. Coagulation Profile: No results for input(s): INR, PROTIME in the last 168 hours. Cardiac Enzymes: No results for input(s): CKTOTAL, CKMB, CKMBINDEX, TROPONINI in the last 168 hours. BNP (last 3 results) No results for input(s): PROBNP in the last 8760 hours. HbA1C: No results for input(s): HGBA1C in the last 72 hours. CBG: Recent Labs  Lab 12/11/20 0631  GLUCAP 163*   Lipid Profile: No results for input(s): CHOL, HDL, LDLCALC, TRIG, CHOLHDL, LDLDIRECT in the last 72 hours. Thyroid Function Tests: No results for input(s): TSH, T4TOTAL, FREET4, T3FREE, THYROIDAB in the last 72 hours. Anemia Panel: No results for input(s): VITAMINB12, FOLATE, FERRITIN, TIBC, IRON, RETICCTPCT in  the last 72 hours. Urine analysis:    Component Value Date/Time   COLORURINE YELLOW (A) 11/11/2020 1314   APPEARANCEUR CLEAR (A) 11/11/2020 1314   LABSPEC 1.018 11/11/2020 1314   PHURINE 6.0 11/11/2020 1314   GLUCOSEU NEGATIVE 11/11/2020 1314   HGBUR NEGATIVE 11/11/2020 1314   BILIRUBINUR NEGATIVE 11/11/2020 1314   KETONESUR NEGATIVE 11/11/2020 1314   PROTEINUR 30 (A) 11/11/2020 1314   NITRITE NEGATIVE 11/11/2020 1314   LEUKOCYTESUR NEGATIVE 11/11/2020 1314   Sepsis Labs: @LABRCNTIP (procalcitonin:4,lacticidven:4)  ) Recent Results (from the past 240 hour(s))  Resp Panel by RT-PCR (Flu A&B, Covid) Nasopharyngeal Swab     Status: None   Collection Time: 12/11/20  6:53 AM   Specimen: Nasopharyngeal Swab; Nasopharyngeal(NP) swabs in vial transport medium  Result Value Ref Range Status   SARS Coronavirus 2 by RT PCR NEGATIVE NEGATIVE Final    Comment: (NOTE) SARS-CoV-2 target nucleic acids are NOT  DETECTED.  The SARS-CoV-2 RNA is generally detectable in upper respiratory specimens during the acute phase of infection. The lowest concentration of SARS-CoV-2 viral copies this assay can detect is 138 copies/mL. A negative result does not preclude SARS-Cov-2 infection and should not be used as the sole basis for treatment or other patient management decisions. A negative result may occur with  improper specimen collection/handling, submission of specimen other than nasopharyngeal swab, presence of viral mutation(s) within the areas targeted by this assay, and inadequate number of viral copies(<138 copies/mL). A negative result must be combined with clinical observations, patient history, and epidemiological information. The expected result is Negative.  Fact Sheet for Patients:  EntrepreneurPulse.com.au  Fact Sheet for Healthcare Providers:  IncredibleEmployment.be  This test is no t yet approved or cleared by the [place] FDA and  has been authorized for detection and/or diagnosis of SARS-CoV-2 by FDA under an Emergency Use Authorization (EUA). This EUA will remain  in effect (meaning this test can be used) for the duration of the COVID-19 declaration under Section 564(b)(1) of the Act, 21 U.S.C.section 360bbb-3(b)(1), unless the authorization is terminated  or revoked sooner.       Influenza A by PCR NEGATIVE NEGATIVE Final   Influenza B by PCR NEGATIVE NEGATIVE Final    Comment: (NOTE) The Xpert Xpress SARS-CoV-2/FLU/RSV plus assay is intended as an aid in the diagnosis of influenza from Nasopharyngeal swab specimens and should not be used as a sole basis for treatment. Nasal washings and aspirates are unacceptable for Xpert Xpress SARS-CoV-2/FLU/RSV testing.  Fact Sheet for Patients: EntrepreneurPulse.com.au  Fact Sheet for Healthcare Providers: IncredibleEmployment.be  This test is not yet  approved or cleared by the [place] FDA and has been authorized for detection and/or diagnosis of SARS-CoV-2 by FDA under an Emergency Use Authorization (EUA). This EUA will remain in effect (meaning this test can be used) for the duration of the COVID-19 declaration under Section 564(b)(1) of the Act, 21 U.S.C. section 360bbb-3(b)(1), unless the authorization is terminated or revoked.  Performed at [facility], [address] [place], [place] [address]   CULTURE, BLOOD (ROUTINE X 2) w Reflex to ID Panel     Status: None (Preliminary result)   Collection Time: 12/11/20 12:20 PM   Specimen: BLOOD  Result Value Ref Range Status   Specimen Description BLOOD BLOOD RIGHT FOREARM  Final   Special Requests   Final    BOTTLES DRAWN AEROBIC AND ANAEROBIC Blood Culture adequate volume   Culture   Final    NO GROWTH 2 DAYS Performed  at [facility], [address] [place], [place] [address]    Report Status PENDING  Incomplete  CULTURE, BLOOD (ROUTINE X 2) w Reflex to ID Panel     Status: None (Preliminary result)   Collection Time: 12/11/20 12:20 PM   Specimen: BLOOD  Result Value Ref Range Status   Specimen Description BLOOD RIGHT ANTECUBITAL  Final   Special Requests   Final    BOTTLES DRAWN AEROBIC AND ANAEROBIC Blood Culture adequate volume   Culture   Final    NO GROWTH 2 DAYS Performed at [facility], [address], [place], [place] [address]    Report Status PENDING  Incomplete      Studies: No results found.  Scheduled Meds: . apixaban  5 mg Oral BID  . aspirin EC  81 mg Oral Daily  . carvedilol  25 mg Oral BID WC  . cyproheptadine  4 mg Oral BID  . ezetimibe  10 mg Oral Daily  . feeding supplement  237 mL Oral BID BM  . furosemide  20 mg Intravenous BID  . lidocaine  1 patch Transdermal Q24H  . multivitamin with minerals  1 tablet Oral Daily  . rosuvastatin  40 mg Oral Daily    Continuous Infusions:   LOS: 1 day      Kayleen Memos, MD [facility] Pager [phone]  If 7PM-7AM, please contact night-coverage [email or web address] Password [email or web address] 12/13/2020, 2:59 PM

## 2020-12-14 DIAGNOSIS — T50902A Poisoning by unspecified drugs, medicaments and biological substances, intentional self-harm, initial encounter: Secondary | ICD-10-CM

## 2020-12-14 LAB — COMPREHENSIVE METABOLIC PANEL
ALT: 98 U/L — ABNORMAL HIGH (ref 0–44)
AST: 69 U/L — ABNORMAL HIGH (ref 15–41)
Albumin: 3.8 g/dL (ref 3.5–5.0)
Alkaline Phosphatase: 57 U/L (ref 38–126)
Anion gap: 10 (ref 5–15)
BUN: 43 mg/dL — ABNORMAL HIGH (ref 8–23)
CO2: 30 mmol/L (ref 22–32)
Calcium: 9.4 mg/dL (ref 8.9–10.3)
Chloride: 102 mmol/L (ref 98–111)
Creatinine, Ser: 1.29 mg/dL — ABNORMAL HIGH (ref 0.61–1.24)
GFR, Estimated: >60 mL/min (ref >60)
Glucose, Bld: 102 mg/dL — ABNORMAL HIGH (ref 70–99)
Potassium: 3.7 mmol/L (ref 3.5–5.1)
Sodium: 142 mmol/L (ref 135–145)
Total Bilirubin: 1.1 mg/dL (ref 0.3–1.2)
Total Protein: 6.7 g/dL (ref 6.5–8.1)

## 2020-12-14 LAB — AMMONIA: Ammonia: 13 umol/L (ref 9–35)

## 2020-12-14 MED ORDER — HYDROCORTISONE ACETATE 25 MG RE SUPP
25.0000 mg | Freq: Two times a day (BID) | RECTAL | Status: DC
  Administered 2020-12-14: 25 mg via RECTAL
  Filled 2020-12-14 (×5): qty 1

## 2020-12-14 MED ORDER — PHENYLEPHRINE-MINERAL OIL-PET 0.25-14-74.9 % RE OINT
1.0000 "application " | TOPICAL_OINTMENT | Freq: Two times a day (BID) | RECTAL | Status: DC | PRN
  Filled 2020-12-14: qty 57

## 2020-12-14 NOTE — Progress Notes (Signed)
PROGRESS NOTE  DINARI STGERMAINE BSW:[id number] DOB: 03-25-1951 DOA: 12/11/2020 PCP: Cipriano Mile, NP  HPI/Recap of past 24 hours: Patient is 70 year old male with past medical history of depression, hypertension, obesity, diastolic CHF, CAD status post CABG and PAD who early morning of 1/13 took 24 Prozac 40 mg pills and 15 Remeron 15 mg pills with the intent to try to kill himself.  He started feeling groggy and called EMS.  Patient was brought in and evaluation noted hypoxia on room air and 91% on 2 L nasal cannula.  Chest x-ray noted multifocal infiltrates, but negative COVID test and patient put on antibiotics.  He was noted to be tremulous and given his intentional overdose, concerns for serotonin syndrome.  Discussed with poison control and psychiatry and patient treated with Ativan.  Admitted to the hospitalist service.  Placed with sitter.  No events overnight.  As patient became more alert by following day today, oxygen saturation improved.  Currently at 96% on room air.  He is still anxious and tremulous though.  12/14/20: Seen and examined at bedside.  He is calm and answering to questions appropriately today mild tremors noted in his hands bilaterally but improved from yesterday.   Assessment/Plan: Principal Problem:   Overdose Active Problems:   Hyperlipidemia   Chronic atrial fibrillation (HCC)   Chronic diastolic congestive heart failure (HCC)   CAD, multiple vessel   Hypertension   History of TIA (transient ischemic attack)   Spinal stenosis of lumbar region without neurogenic claudication   Depression   Suicide attempt ([place])   Serotonin syndrome   Paroxysmal A-fib (HCC)   Obesity (BMI 30-39.9)   Acute on chronic respiratory failure with hypoxia (HCC)  Depression with suicide attempt of overdose of benzodiazepines and SSRI causing obtundation complicated by acute respiratory failure with hypoxia: Now with serotonin syndrome.   Monitoring on telemetry Psych  following Continue 1-to-1 sitter Symptomatology appears to be improving.  Acute metabolic encephalopathy, suspect delirium, resolved This morning he is alert and oriented x3. Yesterday 12/13/2020 afternoon he had an episode of confusion agitation and aggressive behavior, received 1 dose of IM Haldol 2 mg Continue delirium precautions  Chronic atrial fibrillation ([place]):  EKG on admission noted to be in atrial fibrillation.   Rate controlled on coreg, continue.   Continue Eliquis for CVA prevention  Chronic diastolic congestive heart failure ([place]):  BNP on admission minimally elevated. Repeat BNP much higher at almost 600. Received IV Lasix, discontinued on 12/13/2020 Net I&O -1.8 L Strict I's and O's and daily weight  CAD, multiple vessel with history of stent placement and CABG:  Denies any anginal symptoms at the time of this visit.  Continue aspirin and Crestor.  Hypertension: BP is currently stable.  Continue Coreg 25 mg twice daily Continue to closely monitor vital signs.   History of TIA (transient ischemic attack)  Spinal stenosis of lumbar region without neurogenic claudication  Abnormal chest x-ray:  Initially thought to be pneumonia However with negative procalcitonin, no fever, nontoxic-appearing, antibiotics were stopped.   Obesity (BMI 30-39.9):  Meets criteria BMI greater than 30.  Chronic insomnia/anxiety Per his wife he was taking xanax qhs  Has hx of chronic insomnia and anxiety  Concern for benzodiazepine withdrawal UDS positive for benzodiazepines and PCP Agitated and aggressive behavior on 12/13/20 CIWA protocol in place    Code Status: Full code  Family Communication: No family due to suicidal status  Disposition Plan: Inpt psych   Consultants:  Psychiatry  Procedures:  None  Antimicrobials:  IV Rocephin and Zithromax 1/13-1/14  DVT prophylaxis: Eliquis    Status is: Inpatient   Dispo:  Patient From:  Home  Planned Disposition: Geri-Psych  Expected discharge date: 12/16/2020  Medically stable for discharge: No, ongoing management of serotonin syndrome.         Objective: Vitals:   12/13/20 2123 12/13/20 2354 12/14/20 0048 12/14/20 1100  BP: (!) 121/92 117/81  (!) 142/88  Pulse: 85 (!) 119 (!) 57 77  Resp: 18 17    Temp:  97.8 F (36.6 C)  99.1 F (37.3 C)  TempSrc:  Oral  Axillary  SpO2: 95% (!) 77% 95% 99%  Weight:      Height:        Intake/Output Summary (Last 24 hours) at 12/14/2020 1333 Last data filed at 12/14/2020 1237 Gross per 24 hour  Intake 580 ml  Output 950 ml  Net -370 ml   Filed Weights   12/11/20 0621  Weight: 103.9 kg    Exam:  . General: 70 y.o. year-old male multiple . With no concerns. Alert and oriented x3. Hand tremors have improved today and are minimal.  . Cardiovascular: Regular rate and rhythm with no rubs or gallops. Marland Kitchen Respiratory: Clear to auscultation no wheezes rales.  . Abdomen: Soft nontender normal bowel sounds. . Musculoskeletal: Lower extremity edema bilaterally.  . Skin: No ulcers or lesions noted.  Marland Kitchen Psychiatry: Mood is appropriate for condition and setting.   Data Reviewed: CBC: Recent Labs  Lab 12/11/20 0632 12/12/20 0556 12/13/20 0605 12/13/20 1732  WBC 8.5 19.3* 13.5* 13.6*  NEUTROABS 6.2  --   --   --   HGB 14.8 15.7 14.7 15.8  HCT 45.7 46.9 45.7 48.5  MCV 97.9 95.5 98.7 96.6  PLT 202 210 168 [id number]   Basic Metabolic Panel: Recent Labs  Lab 12/11/20 0632 12/12/20 0556 12/13/20 0605 12/13/20 1732 12/14/20 0701  NA 137 142 143 142 142  K 4.0 4.4 4.6 3.9 3.7  CL 102 108 103 100 102  CO2 24 24 28 28 30   GLUCOSE 157* 139* 108* 102* 102*  BUN 16 24* 34* 40* 43*  CREATININE 0.94 1.08 1.20 1.29* 1.29*  CALCIUM 9.6 9.7 9.8 10.0 9.4  MG 2.3  --   --  2.3  --   PHOS  --   --   --  3.9  --    GFR: Estimated Creatinine Clearance: 67.2 mL/min (A) (by C-G formula based on SCr of 1.29 mg/dL (H)). Liver  Function Tests: Recent Labs  Lab 12/11/20 [id number] 12/13/20 1732 12/14/20 0701  AST 26 137* 69*  ALT 31 133* 98*  ALKPHOS 57 66 57  BILITOT 1.1 1.3* 1.1  PROT 7.1 7.5 6.7  ALBUMIN 4.1 4.5 3.8   No results for input(s): LIPASE, AMYLASE in the last 168 hours. Recent Labs  Lab 12/14/20 0701  AMMONIA 13   Coagulation Profile: No results for input(s): INR, PROTIME in the last 168 hours. Cardiac Enzymes: No results for input(s): CKTOTAL, CKMB, CKMBINDEX, TROPONINI in the last 168 hours. BNP (last 3 results) No results for input(s): PROBNP in the last 8760 hours. HbA1C: No results for input(s): HGBA1C in the last 72 hours. CBG: Recent Labs  Lab 12/11/20 0631  GLUCAP 163*   Lipid Profile: No results for input(s): CHOL, HDL, LDLCALC, TRIG, CHOLHDL, LDLDIRECT in the last 72 hours. Thyroid Function Tests: No results for input(s): TSH, T4TOTAL, FREET4, T3FREE, THYROIDAB in the last 72 hours. Anemia  Panel: No results for input(s): VITAMINB12, FOLATE, FERRITIN, TIBC, IRON, RETICCTPCT in the last 72 hours. Urine analysis:    Component Value Date/Time   COLORURINE YELLOW (A) 11/11/2020 1314   APPEARANCEUR CLEAR (A) 11/11/2020 1314   LABSPEC 1.018 11/11/2020 1314   PHURINE 6.0 11/11/2020 1314   GLUCOSEU NEGATIVE 11/11/2020 1314   HGBUR NEGATIVE 11/11/2020 1314   BILIRUBINUR NEGATIVE 11/11/2020 1314   KETONESUR NEGATIVE 11/11/2020 1314   PROTEINUR 30 (A) 11/11/2020 1314   NITRITE NEGATIVE 11/11/2020 1314   LEUKOCYTESUR NEGATIVE 11/11/2020 1314   Sepsis Labs: @LABRCNTIP (procalcitonin:4,lacticidven:4)  ) Recent Results (from the past 240 hour(s))  Resp Panel by RT-PCR (Flu A&B, Covid) Nasopharyngeal Swab     Status: None   Collection Time: 12/11/20  6:53 AM   Specimen: Nasopharyngeal Swab; Nasopharyngeal(NP) swabs in vial transport medium  Result Value Ref Range Status   SARS Coronavirus 2 by RT PCR NEGATIVE NEGATIVE Final    Comment: (NOTE) SARS-CoV-2 target nucleic acids  are NOT DETECTED.  The SARS-CoV-2 RNA is generally detectable in upper respiratory specimens during the acute phase of infection. The lowest concentration of SARS-CoV-2 viral copies this assay can detect is 138 copies/mL. A negative result does not preclude SARS-Cov-2 infection and should not be used as the sole basis for treatment or other patient management decisions. A negative result may occur with  improper specimen collection/handling, submission of specimen other than nasopharyngeal swab, presence of viral mutation(s) within the areas targeted by this assay, and inadequate number of viral copies(<138 copies/mL). A negative result must be combined with clinical observations, patient history, and epidemiological information. The expected result is Negative.  Fact Sheet for Patients:  BloggerCourse.comhttps://www.fda.gov/media/152166/download  Fact Sheet for Healthcare Providers:  SeriousBroker.ithttps://www.fda.gov/media/152162/download  This test is no t yet approved or cleared by the [place] FDA and  has been authorized for detection and/or diagnosis of SARS-CoV-2 by FDA under an Emergency Use Authorization (EUA). This EUA will remain  in effect (meaning this test can be used) for the duration of the COVID-19 declaration under Section 564(b)(1) of the Act, 21 U.S.C.section 360bbb-3(b)(1), unless the authorization is terminated  or revoked sooner.       Influenza A by PCR NEGATIVE NEGATIVE Final   Influenza B by PCR NEGATIVE NEGATIVE Final    Comment: (NOTE) The Xpert Xpress SARS-CoV-2/FLU/RSV plus assay is intended as an aid in the diagnosis of influenza from Nasopharyngeal swab specimens and should not be used as a sole basis for treatment. Nasal washings and aspirates are unacceptable for Xpert Xpress SARS-CoV-2/FLU/RSV testing.  Fact Sheet for Patients: BloggerCourse.comhttps://www.fda.gov/media/152166/download  Fact Sheet for Healthcare Providers: SeriousBroker.ithttps://www.fda.gov/media/152162/download  This test is  not yet approved or cleared by the [place] FDA and has been authorized for detection and/or diagnosis of SARS-CoV-2 by FDA under an Emergency Use Authorization (EUA). This EUA will remain in effect (meaning this test can be used) for the duration of the COVID-19 declaration under Section 564(b)(1) of the Act, 21 U.S.C. section 360bbb-3(b)(1), unless the authorization is terminated or revoked.  Performed at [facility], [address] [place], [place] [address]   CULTURE, BLOOD (ROUTINE X 2) w Reflex to ID Panel     Status: None (Preliminary result)   Collection Time: 12/11/20 12:20 PM   Specimen: BLOOD  Result Value Ref Range Status   Specimen Description BLOOD BLOOD RIGHT FOREARM  Final   Special Requests   Final    BOTTLES DRAWN AEROBIC AND ANAEROBIC Blood Culture adequate volume  Culture   Final    NO GROWTH 3 DAYS Performed at [facility], [address] [place], [place] [address]    Report Status PENDING  Incomplete  CULTURE, BLOOD (ROUTINE X 2) w Reflex to ID Panel     Status: None (Preliminary result)   Collection Time: 12/11/20 12:20 PM   Specimen: BLOOD  Result Value Ref Range Status   Specimen Description BLOOD RIGHT ANTECUBITAL  Final   Special Requests   Final    BOTTLES DRAWN AEROBIC AND ANAEROBIC Blood Culture adequate volume   Culture   Final    NO GROWTH 3 DAYS Performed at [facility], [address], [place], [place] [address]    Report Status PENDING  Incomplete      Studies: No results found.  Scheduled Meds: . apixaban  5 mg Oral BID  . aspirin EC  81 mg Oral Daily  . carvedilol  25 mg Oral BID WC  . ezetimibe  10 mg Oral Daily  . feeding supplement  237 mL Oral BID BM  . folic acid  1 mg Oral Daily  . lidocaine  1 patch Transdermal Q24H  . multivitamin with minerals  1 tablet Oral Daily  . rosuvastatin  40 mg Oral Daily  . thiamine  100 mg Oral Daily   Or  . thiamine  100 mg Intravenous Daily     Continuous Infusions:   LOS: 2 days     Kayleen Memos, MD [facility] Pager [phone]  If 7PM-7AM, please contact night-coverage [email or web address] Password [email or web address] 12/14/2020, 1:33 PM

## 2020-12-14 NOTE — Consult Note (Signed)
[facility] Face-to-Face Psychiatry Consult   Follow-up for 70 year old man status post suicide attempt by overdose who is now admitted to medicine Referring Physician: [place] Pink Patient Identification: Jeremy Reese MRN:  [id number] Principal Diagnosis: Overdose Diagnosis:  Principal Problem:   Overdose Active Problems:   Hyperlipidemia   Chronic atrial fibrillation (HCC)   Chronic diastolic congestive heart failure (HCC)   CAD, multiple vessel   Hypertension   History of TIA (transient ischemic attack)   Spinal stenosis of lumbar region without neurogenic claudication   Depression   Suicide attempt ([place])   Serotonin syndrome   Paroxysmal A-fib (HCC)   Obesity (BMI 30-39.9)   Acute on chronic respiratory failure with hypoxia (HCC)   Total Time spent with patient: 20 minutes  Subjective:   Jeremy Reese is a 70 y.o. male patient admitted after he attempted suicide by overdose on medications.  HPI: Patient seen for follow-up.  He is now on medical floor. Sitter is at bedside. Per sitter, patient is intermittently-confused, thought he was at the airplane earlier today.  Patient reports feeling weak, but "better". Reports high anxiety and states "medications help". He is depressed, but denies suicidal thoughts. No reported hallucinations.  Past Psychiatric History: Past history of severe depression with more than 1 suicide attempt  Risk to Self:   Risk to Others:   Prior Inpatient Therapy:   Prior Outpatient Therapy:    Past Medical History:  Past Medical History:  Diagnosis Date  . Atrial flutter, paroxysmal ([place])   . Carotid artery disease without cerebral infarction ([place])   . Coronary artery disease 2011  . Flatback syndrome of thoracolumbar region   . H/O calcium pyrophosphate deposition disease (CPPD)   . History of TIA (transient ischemic attack)   . Hyperlipidemia   . Paroxysmal A-fib ([place])   . Spinal stenosis of lumbar region without neurogenic claudication      Past Surgical History:  Procedure Laterality Date  . BILATERAL CARPAL TUNNEL RELEASE    . CARDIAC CATHETERIZATION    . CIRCUMCISION, NON-NEWBORN    . CLIPPING OF ATRIAL APPENDAGE N/A 09/18/2020   Procedure: CLIPPING OF ATRIAL APPENDAGE USING 43 ATRICURE LAA  EXCLUSION SYSTEM;  Surgeon: Ivin Poot, MD;  Location: [place];  Service: Open Heart Surgery;  Laterality: N/A;  . CORONARY ANGIOPLASTY WITH STENT PLACEMENT  2011  . CORONARY ARTERY BYPASS GRAFT N/A 09/18/2020   Procedure: CORONARY ARTERY BYPASS GRAFTING (CABG)X 3, ON PUMP, USING LEFT INTERAL MAMMARY ARTERY AND ENDOSCOPICALLY HARVESTED RIGHT GREATER SAPHENOUS VEIN. LIMA TO LAD, SVG TO OM, SVG TO PD;  Surgeon: Ivin Poot, MD;  Location: [place];  Service: Open Heart Surgery;  Laterality: N/A;  . ENDOVEIN HARVEST OF GREATER SAPHENOUS VEIN Right 09/18/2020   Procedure: ENDOVEIN HARVEST OF GREATER SAPHENOUS VEIN;  Surgeon: Ivin Poot, MD;  Location: [place];  Service: Open Heart Surgery;  Laterality: Right;  . LEFT HEART CATH AND CORONARY ANGIOGRAPHY Left 09/12/2020   Procedure: LEFT HEART CATH AND CORONARY ANGIOGRAPHY;  Surgeon: Minna Merritts, MD;  Location: [place] [facility];  Service: Cardiovascular;  Laterality: Left;  . TEE WITHOUT CARDIOVERSION N/A 09/18/2020   Procedure: TRANSESOPHAGEAL ECHOCARDIOGRAM (TEE);  Surgeon: Prescott Gum, Collier Salina, MD;  Location: [place];  Service: Open Heart Surgery;  Laterality: N/A;   Family History:  Family History  Problem Relation Age of Onset  . Heart disease Mother 44       CABG x 3   . Hypertension Father  Family Psychiatric  History:  Social History:  Social History   Substance and Sexual Activity  Alcohol Use Yes   Comment: rare      Social History   Substance and Sexual Activity  Drug Use No    Social History   Socioeconomic History  . Marital status: Married    Spouse name: Not on file  . Number of children: Not on file  . Years of education: Not on file  .  Highest education level: Not on file  Occupational History  . Not on file  Tobacco Use  . Smoking status: Former Smoker    Packs/day: 0.25    Years: 35.00    Pack years: 8.75    Types: Cigarettes    Quit date: 11/29/1993    Years since quitting: 27.0  . Smokeless tobacco: Never Used  Vaping Use  . Vaping Use: Never used  Substance and Sexual Activity  . Alcohol use: Yes    Comment: rare   . Drug use: No  . Sexual activity: Not on file  Other Topics Concern  . Not on file  Social History Narrative  . Not on file   Social Determinants of Health   Financial Resource Strain: Not on file  Food Insecurity: Not on file  Transportation Needs: Not on file  Physical Activity: Not on file  Stress: Not on file  Social Connections: Not on file   Additional Social History:    Allergies:   Allergies  Allergen Reactions  . Propafenone Anxiety  . Codeine Nausea Only  . Penicillins Rash    Labs:  Results for orders placed or performed during the hospital encounter of 12/11/20 (from the past 48 hour(s))  Brain natriuretic peptide     Status: Abnormal   Collection Time: 12/12/20  4:32 PM  Result Value Ref Range   B Natriuretic Peptide 565.7 (H) 0.0 - 100.0 pg/mL    Comment: Performed at [facility], [address] [place], [place] [address]  Procalcitonin - Baseline     Status: None   Collection Time: 12/12/20  4:32 PM  Result Value Ref Range   Procalcitonin <0.10 ng/mL    Comment:        Interpretation: PCT (Procalcitonin) <= 0.5 ng/mL: Systemic infection (sepsis) is not likely. Local bacterial infection is possible. (NOTE)       Sepsis PCT Algorithm           Lower Respiratory Tract                                      Infection PCT Algorithm    ----------------------------     ----------------------------         PCT < 0.25 ng/mL                PCT < 0.10 ng/mL          Strongly encourage             Strongly discourage   discontinuation of antibiotics     initiation of antibiotics    ----------------------------     -----------------------------       PCT 0.25 - 0.50 ng/mL            PCT 0.10 - 0.25 ng/mL               OR       >80% decrease in PCT  Discourage initiation of                                            antibiotics      Encourage discontinuation           of antibiotics    ----------------------------     -----------------------------         PCT >= 0.50 ng/mL              PCT 0.26 - 0.50 ng/mL               AND        <80% decrease in PCT             Encourage initiation of                                             antibiotics       Encourage continuation           of antibiotics    ----------------------------     -----------------------------        PCT >= 0.50 ng/mL                  PCT > 0.50 ng/mL               AND         increase in PCT                  Strongly encourage                                      initiation of antibiotics    Strongly encourage escalation           of antibiotics                                     -----------------------------                                           PCT <= 0.25 ng/mL                                                 OR                                        > 80% decrease in PCT                                      Discontinue / Do not initiate  antibiotics  Performed at [facility], [address] [place], [place] [id number]   Basic metabolic panel     Status: Abnormal   Collection Time: 12/13/20  6:05 AM  Result Value Ref Range   Sodium 143 135 - 145 mmol/L   Potassium 4.6 3.5 - 5.1 mmol/L   Chloride 103 98 - 111 mmol/L   CO2 28 22 - 32 mmol/L   Glucose, Bld 108 (H) 70 - 99 mg/dL    Comment: Glucose reference range applies only to samples taken after fasting for at least 8 hours.   BUN 34 (H) 8 - 23 mg/dL   Creatinine, Ser 1.20 0.61 - 1.24 mg/dL   Calcium 9.8 8.9 - 10.3 mg/dL   GFR,  Estimated >60 >60 mL/min    Comment: (NOTE) Calculated using the CKD-EPI Creatinine Equation (2021)    Anion gap 12 5 - 15    Comment: Performed at [facility], [address] [place], [place] [address]  CBC     Status: Abnormal   Collection Time: 12/13/20  6:05 AM  Result Value Ref Range   WBC 13.5 (H) 4.0 - 10.5 K/uL   RBC 4.63 4.22 - 5.81 MIL/uL   Hemoglobin 14.7 13.0 - 17.0 g/dL   HCT 45.7 39.0 - 52.0 %   MCV 98.7 80.0 - 100.0 fL   MCH 31.7 26.0 - 34.0 pg   MCHC 32.2 30.0 - 36.0 g/dL   RDW 13.8 11.5 - 15.5 %   Platelets 168 150 - 400 K/uL   nRBC 0.0 0.0 - 0.2 %    Comment: Performed at [facility], [address] [place], [place] [address]  Comprehensive metabolic panel     Status: Abnormal   Collection Time: 12/13/20  5:32 PM  Result Value Ref Range   Sodium 142 135 - 145 mmol/L   Potassium 3.9 3.5 - 5.1 mmol/L   Chloride 100 98 - 111 mmol/L   CO2 28 22 - 32 mmol/L   Glucose, Bld 102 (H) 70 - 99 mg/dL    Comment: Glucose reference range applies only to samples taken after fasting for at least 8 hours.   BUN 40 (H) 8 - 23 mg/dL   Creatinine, Ser 1.29 (H) 0.61 - 1.24 mg/dL   Calcium 10.0 8.9 - 10.3 mg/dL   Total Protein 7.5 6.5 - 8.1 g/dL   Albumin 4.5 3.5 - 5.0 g/dL   AST 137 (H) 15 - 41 U/L   ALT 133 (H) 0 - 44 U/L   Alkaline Phosphatase 66 38 - 126 U/L   Total Bilirubin 1.3 (H) 0.3 - 1.2 mg/dL   GFR, Estimated >60 >60 mL/min    Comment: (NOTE) Calculated using the CKD-EPI Creatinine Equation (2021)    Anion gap 14 5 - 15    Comment: Performed at [facility], [address] [place], [place] [address]  Magnesium     Status: None   Collection Time: 12/13/20  5:32 PM  Result Value Ref Range   Magnesium 2.3 1.7 - 2.4 mg/dL    Comment: Performed at [facility], [address], [place], [place] [address]  Phosphorus     Status: None   Collection Time: 12/13/20  5:32 PM  Result Value Ref Range   Phosphorus 3.9 2.5 - 4.6  mg/dL    Comment: Performed at [facility], [address], [place], [place] [address]  CBC     Status: Abnormal   Collection Time:  12/13/20  5:32 PM  Result Value Ref Range   WBC 13.6 (H) 4.0 - 10.5 K/uL   RBC 5.02 4.22 - 5.81 MIL/uL   Hemoglobin 15.8 13.0 - 17.0 g/dL   HCT 48.5 39.0 - 52.0 %   MCV 96.6 80.0 - 100.0 fL   MCH 31.5 26.0 - 34.0 pg   MCHC 32.6 30.0 - 36.0 g/dL   RDW 13.4 11.5 - 15.5 %   Platelets 184 150 - 400 K/uL   nRBC 0.0 0.0 - 0.2 %    Comment: Performed at [facility], [address] [place], [place] [address]  Comprehensive metabolic panel     Status: Abnormal   Collection Time: 12/14/20  7:01 AM  Result Value Ref Range   Sodium 142 135 - 145 mmol/L   Potassium 3.7 3.5 - 5.1 mmol/L   Chloride 102 98 - 111 mmol/L   CO2 30 22 - 32 mmol/L   Glucose, Bld 102 (H) 70 - 99 mg/dL    Comment: Glucose reference range applies only to samples taken after fasting for at least 8 hours.   BUN 43 (H) 8 - 23 mg/dL   Creatinine, Ser 1.29 (H) 0.61 - 1.24 mg/dL   Calcium 9.4 8.9 - 10.3 mg/dL   Total Protein 6.7 6.5 - 8.1 g/dL   Albumin 3.8 3.5 - 5.0 g/dL   AST 69 (H) 15 - 41 U/L   ALT 98 (H) 0 - 44 U/L   Alkaline Phosphatase 57 38 - 126 U/L   Total Bilirubin 1.1 0.3 - 1.2 mg/dL   GFR, Estimated >60 >60 mL/min    Comment: (NOTE) Calculated using the CKD-EPI Creatinine Equation (2021)    Anion gap 10 5 - 15    Comment: Performed at [facility], [address] [place], [place] [address]  Ammonia     Status: None   Collection Time: 12/14/20  7:01 AM  Result Value Ref Range   Ammonia 13 9 - 35 umol/L    Comment: Performed at [facility], [address] [place], [place] [address]    Current Facility-Administered Medications  Medication Dose Route Frequency Provider Last Rate Last Admin  . acetaminophen (TYLENOL) tablet 650 mg  650 mg Oral Q6H PRN Ivor Costa, MD      . albuterol (VENTOLIN HFA) 108 (90 Base) MCG/ACT inhaler 2  puff  2 puff Inhalation Q4H PRN Ivor Costa, MD      . apixaban [name]) tablet 5 mg  5 mg Oral BID Ivor Costa, MD   5 mg at 12/14/20 1138  . aspirin EC tablet 81 mg  81 mg Oral Daily Ivor Costa, MD   81 mg at 12/14/20 1138  . carvedilol (COREG) tablet 25 mg  25 mg Oral BID WC Ivor Costa, MD   25 mg at 12/14/20 1141  . ezetimibe (ZETIA) tablet 10 mg  10 mg Oral Daily Ivor Costa, MD   10 mg at 12/14/20 1139  . feeding supplement (ENSURE ENLIVE / ENSURE PLUS) liquid 237 mL  237 mL Oral BID BM Hall, Carole N, DO   237 mL at 12/14/20 1139  . folic acid (FOLVITE) tablet 1 mg  1 mg Oral Daily Kirwin, Carole N, DO   1 mg at 12/14/20 1137  . guaiFENesin-dextromethorphan (ROBITUSSIN DM) 100-10 MG/5ML syrup 5 mL  5 mL Oral Q4H PRN Sharion Settler, NP   5 mL at 12/12/20 1753  . hydrALAZINE (APRESOLINE) injection 5 mg  5 mg Intravenous  Q2H PRN Ivor Costa, MD      . lidocaine (LIDODERM) 5 % 1 patch  1 patch Transdermal Q24H Ivor Costa, MD   1 patch at 12/14/20 1139  . LORazepam (ATIVAN) tablet 1-4 mg  1-4 mg Oral Q1H PRN Kayleen Memos, DO       Or  . LORazepam (ATIVAN) injection 1-4 mg  1-4 mg Intravenous Q1H PRN Irene Pap N, DO   3 mg at 12/13/20 2130  . LORazepam (ATIVAN) tablet 1 mg  1 mg Oral Q6H PRN Ivor Costa, MD   1 mg at 12/13/20 1146  . multivitamin with minerals tablet 1 tablet  1 tablet Oral Daily Irene Pap N, DO   1 tablet at 12/14/20 1137  . nitroGLYCERIN (NITROSTAT) SL tablet 0.4 mg  0.4 mg Sublingual Q5 min PRN Ivor Costa, MD      . promethazine (PHENERGAN) injection 12.5 mg  12.5 mg Intravenous Q8H PRN Ivor Costa, MD      . rosuvastatin (CRESTOR) tablet 40 mg  40 mg Oral Daily Ivor Costa, MD   40 mg at 12/14/20 1140  . thiamine tablet 100 mg  100 mg Oral Daily Irene Pap N, DO   100 mg at 12/14/20 1140   Or  . thiamine (B-1) injection 100 mg  100 mg Intravenous Daily Kayleen Memos, DO       Psychiatric Specialty Exam: Physical Exam  Review of Systems  Blood pressure (!) 142/88,  pulse 77, temperature 99.1 F (37.3 C), temperature source Axillary, resp. rate 17, height 5\' 11"  (1.803 m), weight 103.9 kg, SpO2 99 %.Body mass index is 31.94 kg/m.  General Appearance: Casual  Eye Contact:  Fair  Speech:  Slow  Volume:  Decreased  Mood:  Anxious and Depressed  Affect:  Congruent  Thought Process:  Coherent and Goal Directed  Orientation:  Full (Time, Place, and Person) at the time of the interview  Thought Content:  Logical  Suicidal Thoughts:  denies today  Homicidal Thoughts:  No  Memory:  Immediate;   Fair Recent;   Fair  Judgement:  Poor  Insight:  Shallow  Psychomotor Activity:  Decreased and Tremor  Concentration:  Concentration: Fair  Recall:  [employer] of Knowledge:  Fair  Language:  Fair  Akathisia:  No  Handed:  Right  AIMS (if indicated):     Assets:  Desire for Improvement Housing Resilience Social Support  ADL's:  Impaired  Cognition:  Impaired,  Mild  Sleep:        Treatment Plan Summary: Medication management Patient continues to exhibit some signs of serotonin syndrome with elevated blood pressure, tremulousness, intermittent confusion. He reports anxiety, denies no active suicidal thoughts, plans. Continue to recommend avoiding restarting any antidepressant medication.  We will continue to follow as a consult and can plan for admission once he is fully medically stable   Disposition: Recommend psychiatric Inpatient admission when medically cleared.    Larita Fife, MD 12/14/2020 1:35 PM

## 2020-12-15 ENCOUNTER — Encounter: Payer: Self-pay | Admitting: *Deleted

## 2020-12-15 LAB — HEPATITIS PANEL, ACUTE
HCV Ab: NONREACTIVE
Hep A IgM: NONREACTIVE
Hep B C IgM: NONREACTIVE
Hepatitis B Surface Ag: NONREACTIVE

## 2020-12-15 NOTE — Progress Notes (Signed)
   12/15/20 0127  Assess: MEWS Score  Temp 98.3 F (36.8 C)  BP (!) 146/103  Pulse Rate 98  Resp (!) 24  SpO2 98 %  O2 Device Room Air  Assess: MEWS Score  MEWS Temp 0  MEWS Systolic 0  MEWS Pulse 0  MEWS RR 1  MEWS LOC 1  MEWS Score 2  MEWS Score Color Yellow  Assess: if the MEWS score is Yellow or Red  Were vital signs taken at a resting state? Yes  Focused Assessment Change from prior assessment (see assessment flowsheet)  Early Detection of Sepsis Score *See Row Information* Low  MEWS guidelines implemented *See Row Information* Yes  Treat  MEWS Interventions Other (Comment) (continue to monitor)  Pain Scale 0-10  Pain Score 0  Take Vital Signs  Increase Vital Sign Frequency  Yellow: Q 2hr X 2 then Q 4hr X 2, if remains yellow, continue Q 4hrs  Escalate  MEWS: Escalate Yellow: discuss with charge nurse/RN and consider discussing with provider and RRT  Notify: Charge Nurse/RN  Name of Charge Nurse/RN Notified Microbiologist  Date Charge Nurse/RN Notified 12/15/20  Time Charge Nurse/RN Notified 0143

## 2020-12-15 NOTE — Progress Notes (Signed)
PROGRESS NOTE  Jeremy Reese JME:[id number] DOB: October 19, 1951 DOA: 12/11/2020 PCP: Cipriano Mile, NP  HPI/Recap of past 24 hours: Patient is 70 year old male with past medical history of depression, hypertension, obesity, diastolic CHF, CAD status post CABG and PAD who early morning of 12/11/20 took 24 Prozac 40 mg pills and 15 Remeron 15 mg pills with the intent to kill himself.  He started feeling groggy and called EMS.  Patient was brought in and evaluation noted hypoxia on room air and 91% on 2 L nasal cannula.  Chest x-ray noted multifocal infiltrates, but negative COVID test and patient put on antibiotics.  He was noted to be tremulous and given his intentional overdose, concerns for serotonin syndrome.  Discussed with poison control and psychiatry and patient treated with Ativan.  Admitted to the hospitalist service.  Placed with one-to-one sitter.  Seen by psychiatry, plan to transfer to inpatient psych once the patient is medically cleared.  12/15/20: Seen and examined at his bedside.  One-to-one sitter in the room.  Bilateral, left greater than right hand tremors noted on exam.  He is alert and oriented x3.  He has no new complaints.  Assessment/Plan: Principal Problem:   Overdose Active Problems:   Hyperlipidemia   Chronic atrial fibrillation (HCC)   Chronic diastolic congestive heart failure (HCC)   CAD, multiple vessel   Hypertension   History of TIA (transient ischemic attack)   Spinal stenosis of lumbar region without neurogenic claudication   Depression   Suicide attempt ([place])   Serotonin syndrome   Paroxysmal A-fib (HCC)   Obesity (BMI 30-39.9)   Acute on chronic respiratory failure with hypoxia (HCC)  Depression with suicide attempt of overdose of benzodiazepines and SSRI causing obtundation, now resolved, complicated by acute respiratory failure with hypoxia: with serotonin syndrome.   Monitoring on telemetry Psych following Continue 1-to-1 sitter Symptomatology  appears to be improving.  Acute metabolic encephalopathy, suspect delirium, resolved This morning he is alert and oriented x3. 12/13/2020 afternoon he had an episode of confusion agitation and aggressive behavior, received 1 dose of IM Haldol 2 mg Continue delirium precautions  Improving acute transaminitis, likely related to his overdose LFTs are down-trending Continue to avoid hepatotoxic agents  Chronic atrial fibrillation [facility]):  EKG on admission noted to be in atrial fibrillation.   Rate controlled on coreg, continue.  Continue Eliquis for CVA prevention  Chronic diastolic congestive heart failure ([place]):  BNP on admission minimally elevated. Repeat BNP much higher at almost 600. Received IV Lasix, discontinued on 12/13/2020 Net I&O -2.2 L Continue strict I's and O's and daily weight  CAD, multiple vessel with history of stent placement and CABG:  Denies any anginal symptoms at the time of this visit.  Continue aspirin and Crestor.  Hypertension: BP is at goal. Continue Coreg 25 mg twice daily Continue to closely monitor vital signs.   History of TIA (transient ischemic attack)  Spinal stenosis of lumbar region without neurogenic claudication  Abnormal chest x-ray:  Initially thought to be pneumonia However with negative procalcitonin, no fever, nontoxic-appearing, antibiotics were stopped.   Obesity (BMI 30-39.9):  Meets criteria BMI greater than 30.  Chronic insomnia/anxiety Per his wife he was taking xanax qhs  Has hx of chronic insomnia and anxiety  Concern for benzodiazepine withdrawal UDS positive for benzodiazepines and PCP PCP is likely a false positive. He denies use of street drugs CIWA protocol in place    Code Status: Full code  Family Communication:  Updated his wife  via phone.  Disposition Plan: Inpt psych   Consultants:  Psychiatry  Procedures:  None  Antimicrobials:  IV Rocephin and Zithromax 1/13-1/14  DVT  prophylaxis: Eliquis    Status is: Inpatient   Dispo:  Patient From: Home  Planned Disposition: Geri-Psych  Expected discharge date: 12/16/2020  Medically stable for discharge: No, ongoing management of serotonin syndrome.         Objective: Vitals:   12/15/20 0242 12/15/20 0437 12/15/20 0829 12/15/20 1300  BP: (!) 156/114 (!) 172/98 (!) 147/99 131/70  Pulse: 91 92 77 96  Resp: (!) 24 20 18  (!) 25  Temp: (!) 97.5 F (36.4 C) 98.2 F (36.8 C) 99.9 F (37.7 C) 98.3 F (36.8 C)  TempSrc: Oral  Oral Oral  SpO2: 96% 98% 95% 96%  Weight:      Height:        Intake/Output Summary (Last 24 hours) at 12/15/2020 1605 Last data filed at 12/15/2020 1100 Gross per 24 hour  Intake 240 ml  Output 700 ml  Net -460 ml   Filed Weights   12/11/20 0621  Weight: 103.9 kg    Exam:  . General: 70 y.o. year-old male well-developed well-nourished in no acute distress.  Alert and oriented x3.  Bilateral hand tremors noted left greater than right.   . Cardiovascular: Regular rate and rhythm no rubs or gallops.  Marland Kitchen Respiratory: Clear to auscultation no wheezes rales.   . Abdomen: Soft nontender normal bowel sounds present.  Musculoskeletal: No lower extremity edema bilaterally. . Skin: No ulcerative lesions noted. Marland Kitchen Psychiatry: Mood is appropriate for condition setting.   Data Reviewed: CBC: Recent Labs  Lab 12/11/20 0632 12/12/20 0556 12/13/20 0605 12/13/20 1732  WBC 8.5 19.3* 13.5* 13.6*  NEUTROABS 6.2  --   --   --   HGB 14.8 15.7 14.7 15.8  HCT 45.7 46.9 45.7 48.5  MCV 97.9 95.5 98.7 96.6  PLT 202 210 168 [id number]   Basic Metabolic Panel: Recent Labs  Lab 12/11/20 0632 12/12/20 0556 12/13/20 0605 12/13/20 1732 12/14/20 0701  NA 137 142 143 142 142  K 4.0 4.4 4.6 3.9 3.7  CL 102 108 103 100 102  CO2 24 24 28 28 30   GLUCOSE 157* 139* 108* 102* 102*  BUN 16 24* 34* 40* 43*  CREATININE 0.94 1.08 1.20 1.29* 1.29*  CALCIUM 9.6 9.7 9.8 10.0 9.4  MG 2.3  --   --  2.3   --   PHOS  --   --   --  3.9  --    GFR: Estimated Creatinine Clearance: 67.2 mL/min (A) (by C-G formula based on SCr of 1.29 mg/dL (H)). Liver Function Tests: Recent Labs  Lab 12/11/20 [id number] 12/13/20 1732 12/14/20 0701  AST 26 137* 69*  ALT 31 133* 98*  ALKPHOS 57 66 57  BILITOT 1.1 1.3* 1.1  PROT 7.1 7.5 6.7  ALBUMIN 4.1 4.5 3.8   No results for input(s): LIPASE, AMYLASE in the last 168 hours. Recent Labs  Lab 12/14/20 0701  AMMONIA 13   Coagulation Profile: No results for input(s): INR, PROTIME in the last 168 hours. Cardiac Enzymes: No results for input(s): CKTOTAL, CKMB, CKMBINDEX, TROPONINI in the last 168 hours. BNP (last 3 results) No results for input(s): PROBNP in the last 8760 hours. HbA1C: No results for input(s): HGBA1C in the last 72 hours. CBG: Recent Labs  Lab 12/11/20 0631  GLUCAP 163*   Lipid Profile: No results for input(s): CHOL, HDL, LDLCALC, TRIG,  CHOLHDL, LDLDIRECT in the last 72 hours. Thyroid Function Tests: No results for input(s): TSH, T4TOTAL, FREET4, T3FREE, THYROIDAB in the last 72 hours. Anemia Panel: No results for input(s): VITAMINB12, FOLATE, FERRITIN, TIBC, IRON, RETICCTPCT in the last 72 hours. Urine analysis:    Component Value Date/Time   COLORURINE YELLOW (A) 11/11/2020 1314   APPEARANCEUR CLEAR (A) 11/11/2020 1314   LABSPEC 1.018 11/11/2020 1314   PHURINE 6.0 11/11/2020 1314   GLUCOSEU NEGATIVE 11/11/2020 1314   HGBUR NEGATIVE 11/11/2020 1314   BILIRUBINUR NEGATIVE 11/11/2020 1314   KETONESUR NEGATIVE 11/11/2020 1314   PROTEINUR 30 (A) 11/11/2020 1314   NITRITE NEGATIVE 11/11/2020 1314   LEUKOCYTESUR NEGATIVE 11/11/2020 1314   Sepsis Labs: @LABRCNTIP (procalcitonin:4,lacticidven:4)  ) Recent Results (from the past 240 hour(s))  Resp Panel by RT-PCR (Flu A&B, Covid) Nasopharyngeal Swab     Status: None   Collection Time: 12/11/20  6:53 AM   Specimen: Nasopharyngeal Swab; Nasopharyngeal(NP) swabs in vial transport  medium  Result Value Ref Range Status   SARS Coronavirus 2 by RT PCR NEGATIVE NEGATIVE Final    Comment: (NOTE) SARS-CoV-2 target nucleic acids are NOT DETECTED.  The SARS-CoV-2 RNA is generally detectable in upper respiratory specimens during the acute phase of infection. The lowest concentration of SARS-CoV-2 viral copies this assay can detect is 138 copies/mL. A negative result does not preclude SARS-Cov-2 infection and should not be used as the sole basis for treatment or other patient management decisions. A negative result may occur with  improper specimen collection/handling, submission of specimen other than nasopharyngeal swab, presence of viral mutation(s) within the areas targeted by this assay, and inadequate number of viral copies(<138 copies/mL). A negative result must be combined with clinical observations, patient history, and epidemiological information. The expected result is Negative.  Fact Sheet for Patients:  EntrepreneurPulse.com.au  Fact Sheet for Healthcare Providers:  IncredibleEmployment.be  This test is no t yet approved or cleared by the [place] FDA and  has been authorized for detection and/or diagnosis of SARS-CoV-2 by FDA under an Emergency Use Authorization (EUA). This EUA will remain  in effect (meaning this test can be used) for the duration of the COVID-19 declaration under Section 564(b)(1) of the Act, 21 U.S.C.section 360bbb-3(b)(1), unless the authorization is terminated  or revoked sooner.       Influenza A by PCR NEGATIVE NEGATIVE Final   Influenza B by PCR NEGATIVE NEGATIVE Final    Comment: (NOTE) The Xpert Xpress SARS-CoV-2/FLU/RSV plus assay is intended as an aid in the diagnosis of influenza from Nasopharyngeal swab specimens and should not be used as a sole basis for treatment. Nasal washings and aspirates are unacceptable for Xpert Xpress SARS-CoV-2/FLU/RSV testing.  Fact Sheet for  Patients: EntrepreneurPulse.com.au  Fact Sheet for Healthcare Providers: IncredibleEmployment.be  This test is not yet approved or cleared by the [place] FDA and has been authorized for detection and/or diagnosis of SARS-CoV-2 by FDA under an Emergency Use Authorization (EUA). This EUA will remain in effect (meaning this test can be used) for the duration of the COVID-19 declaration under Section 564(b)(1) of the Act, 21 U.S.C. section 360bbb-3(b)(1), unless the authorization is terminated or revoked.  Performed at [facility], [address] [place], [place] [address]   CULTURE, BLOOD (ROUTINE X 2) w Reflex to ID Panel     Status: None (Preliminary result)   Collection Time: 12/11/20 12:20 PM   Specimen: BLOOD  Result Value Ref Range Status   Specimen Description BLOOD BLOOD  RIGHT FOREARM  Final   Special Requests   Final    BOTTLES DRAWN AEROBIC AND ANAEROBIC Blood Culture adequate volume   Culture   Final    NO GROWTH 4 DAYS Performed at [facility], [address] [place], [place] [address]    Report Status PENDING  Incomplete  CULTURE, BLOOD (ROUTINE X 2) w Reflex to ID Panel     Status: None (Preliminary result)   Collection Time: 12/11/20 12:20 PM   Specimen: BLOOD  Result Value Ref Range Status   Specimen Description BLOOD RIGHT ANTECUBITAL  Final   Special Requests   Final    BOTTLES DRAWN AEROBIC AND ANAEROBIC Blood Culture adequate volume   Culture   Final    NO GROWTH 4 DAYS Performed at [facility], [address] [place], [place] [address]    Report Status PENDING  Incomplete      Studies: No results found.  Scheduled Meds: . apixaban  5 mg Oral BID  . aspirin EC  81 mg Oral Daily  . carvedilol  25 mg Oral BID WC  . ezetimibe  10 mg Oral Daily  . feeding supplement  237 mL Oral BID BM  . folic acid  1 mg Oral Daily  . hydrocortisone  25 mg Rectal BID  . lidocaine  1 patch  Transdermal Q24H  . multivitamin with minerals  1 tablet Oral Daily  . rosuvastatin  40 mg Oral Daily  . thiamine  100 mg Oral Daily   Or  . thiamine  100 mg Intravenous Daily    Continuous Infusions:   LOS: 3 days     Kayleen Memos, MD [facility] Pager [phone]  If 7PM-7AM, please contact night-coverage [email or web address] Password [email or web address] 12/15/2020, 4:05 PM

## 2020-12-16 ENCOUNTER — Other Ambulatory Visit: Payer: Self-pay | Admitting: Behavioral Health

## 2020-12-16 ENCOUNTER — Other Ambulatory Visit: Payer: Self-pay

## 2020-12-16 ENCOUNTER — Inpatient Hospital Stay
Admission: AD | Admit: 2020-12-16 | Discharge: 2020-12-29 | DRG: 885 | Disposition: A | Discharge status: Home / Self Care | Payer: Medicare HMO | Source: Intra-hospital | Attending: Behavioral Health | Admitting: Behavioral Health

## 2020-12-16 ENCOUNTER — Encounter: Payer: Self-pay | Admitting: Behavioral Health

## 2020-12-16 DIAGNOSIS — Z20822 Contact with and (suspected) exposure to covid-19: Secondary | ICD-10-CM | POA: Diagnosis present

## 2020-12-16 DIAGNOSIS — Z951 Presence of aortocoronary bypass graft: Secondary | ICD-10-CM

## 2020-12-16 DIAGNOSIS — G4700 Insomnia, unspecified: Secondary | ICD-10-CM | POA: Diagnosis present

## 2020-12-16 DIAGNOSIS — T50902A Poisoning by unspecified drugs, medicaments and biological substances, intentional self-harm, initial encounter: Secondary | ICD-10-CM | POA: Diagnosis not present

## 2020-12-16 DIAGNOSIS — E785 Hyperlipidemia, unspecified: Secondary | ICD-10-CM | POA: Diagnosis present

## 2020-12-16 DIAGNOSIS — Z8673 Personal history of transient ischemic attack (TIA), and cerebral infarction without residual deficits: Secondary | ICD-10-CM

## 2020-12-16 DIAGNOSIS — Z9151 Personal history of suicidal behavior: Secondary | ICD-10-CM

## 2020-12-16 DIAGNOSIS — Z7982 Long term (current) use of aspirin: Secondary | ICD-10-CM | POA: Diagnosis not present

## 2020-12-16 DIAGNOSIS — Z23 Encounter for immunization: Secondary | ICD-10-CM | POA: Diagnosis present

## 2020-12-16 DIAGNOSIS — K5909 Other constipation: Secondary | ICD-10-CM | POA: Diagnosis present

## 2020-12-16 DIAGNOSIS — M4035 Flatback syndrome, thoracolumbar region: Secondary | ICD-10-CM | POA: Diagnosis present

## 2020-12-16 DIAGNOSIS — Z8249 Family history of ischemic heart disease and other diseases of the circulatory system: Secondary | ICD-10-CM

## 2020-12-16 DIAGNOSIS — I5032 Chronic diastolic (congestive) heart failure: Secondary | ICD-10-CM | POA: Diagnosis present

## 2020-12-16 DIAGNOSIS — Z87891 Personal history of nicotine dependence: Secondary | ICD-10-CM

## 2020-12-16 DIAGNOSIS — I110 Hypertensive heart disease with heart failure: Secondary | ICD-10-CM | POA: Diagnosis present

## 2020-12-16 DIAGNOSIS — T1491XA Suicide attempt, initial encounter: Secondary | ICD-10-CM | POA: Diagnosis not present

## 2020-12-16 DIAGNOSIS — Z955 Presence of coronary angioplasty implant and graft: Secondary | ICD-10-CM

## 2020-12-16 DIAGNOSIS — I10 Essential (primary) hypertension: Secondary | ICD-10-CM | POA: Diagnosis present

## 2020-12-16 DIAGNOSIS — I482 Chronic atrial fibrillation, unspecified: Secondary | ICD-10-CM | POA: Diagnosis present

## 2020-12-16 DIAGNOSIS — F411 Generalized anxiety disorder: Secondary | ICD-10-CM | POA: Diagnosis present

## 2020-12-16 DIAGNOSIS — I2510 Atherosclerotic heart disease of native coronary artery without angina pectoris: Secondary | ICD-10-CM | POA: Diagnosis present

## 2020-12-16 DIAGNOSIS — Z79899 Other long term (current) drug therapy: Secondary | ICD-10-CM

## 2020-12-16 DIAGNOSIS — R45851 Suicidal ideations: Secondary | ICD-10-CM | POA: Diagnosis present

## 2020-12-16 DIAGNOSIS — F332 Major depressive disorder, recurrent severe without psychotic features: Principal | ICD-10-CM | POA: Diagnosis present

## 2020-12-16 DIAGNOSIS — Z7901 Long term (current) use of anticoagulants: Secondary | ICD-10-CM | POA: Diagnosis not present

## 2020-12-16 LAB — CULTURE, BLOOD (ROUTINE X 2)
Culture: NO GROWTH
Culture: NO GROWTH
Special Requests: ADEQUATE
Special Requests: ADEQUATE

## 2020-12-16 LAB — LEGIONELLA PNEUMOPHILA SEROGP 1 UR AG: L. pneumophila Serogp 1 Ur Ag: NEGATIVE

## 2020-12-16 LAB — RESP PANEL BY RT-PCR (FLU A&B, COVID) ARPGX2
Influenza A by PCR: NEGATIVE
Influenza B by PCR: NEGATIVE
SARS Coronavirus 2 by RT PCR: NEGATIVE

## 2020-12-16 MED ORDER — LORAZEPAM 1 MG PO TABS
1.0000 mg | ORAL_TABLET | Freq: Four times a day (QID) | ORAL | Status: DC | PRN
  Administered 2020-12-17 – 2020-12-28 (×17): 1 mg via ORAL
  Filled 2020-12-16 (×17): qty 1

## 2020-12-16 MED ORDER — EZETIMIBE 10 MG PO TABS
10.0000 mg | ORAL_TABLET | Freq: Every day | ORAL | Status: DC
  Administered 2020-12-16 – 2020-12-29 (×14): 10 mg via ORAL
  Filled 2020-12-16 (×15): qty 1

## 2020-12-16 MED ORDER — ACETAMINOPHEN 325 MG PO TABS
650.0000 mg | ORAL_TABLET | Freq: Four times a day (QID) | ORAL | Status: DC | PRN
  Administered 2020-12-19 – 2020-12-28 (×5): 650 mg via ORAL
  Filled 2020-12-16 (×5): qty 2

## 2020-12-16 MED ORDER — ASPIRIN EC 81 MG PO TBEC
81.0000 mg | DELAYED_RELEASE_TABLET | Freq: Every day | ORAL | Status: DC
  Administered 2020-12-16 – 2020-12-29 (×14): 81 mg via ORAL
  Filled 2020-12-16 (×14): qty 1

## 2020-12-16 MED ORDER — NITROGLYCERIN 0.4 MG SL SUBL: 0.4000 mg | SUBLINGUAL_TABLET | SUBLINGUAL | Status: DC | PRN

## 2020-12-16 MED ORDER — PNEUMOCOCCAL VAC POLYVALENT 25 MCG/0.5ML IJ INJ
0.5000 mL | INJECTION | INTRAMUSCULAR | Status: AC
  Administered 2020-12-17: 0.5 mL via INTRAMUSCULAR
  Filled 2020-12-16 (×2): qty 0.5

## 2020-12-16 MED ORDER — POLYETHYLENE GLYCOL 3350 17 G PO PACK
17.0000 g | PACK | Freq: Every day | ORAL | Status: DC
  Filled 2020-12-16: qty 1

## 2020-12-16 MED ORDER — SENNOSIDES-DOCUSATE SODIUM 8.6-50 MG PO TABS
2.0000 | ORAL_TABLET | Freq: Every day | ORAL | Status: DC
  Administered 2020-12-16 – 2020-12-29 (×14): 2 via ORAL
  Filled 2020-12-16 (×15): qty 2

## 2020-12-16 MED ORDER — MAGNESIUM HYDROXIDE 400 MG/5ML PO SUSP: 30.0000 mL | Freq: Every day | ORAL | Status: DC | PRN

## 2020-12-16 MED ORDER — ROSUVASTATIN CALCIUM 20 MG PO TABS
40.0000 mg | ORAL_TABLET | Freq: Every day | ORAL | Status: DC
  Administered 2020-12-16 – 2020-12-29 (×14): 40 mg via ORAL
  Filled 2020-12-16 (×15): qty 2

## 2020-12-16 MED ORDER — ALUM & MAG HYDROXIDE-SIMETH 200-200-20 MG/5ML PO SUSP
30.0000 mL | ORAL | Status: DC | PRN
  Administered 2020-12-26: 30 mL via ORAL
  Filled 2020-12-16: qty 30

## 2020-12-16 MED ORDER — GABAPENTIN 400 MG PO CAPS
400.0000 mg | ORAL_CAPSULE | Freq: Three times a day (TID) | ORAL | Status: DC
  Administered 2020-12-16 – 2020-12-23 (×19): 400 mg via ORAL
  Filled 2020-12-16 (×19): qty 1

## 2020-12-16 MED ORDER — CARVEDILOL 12.5 MG PO TABS
25.0000 mg | ORAL_TABLET | Freq: Two times a day (BID) | ORAL | Status: DC
  Administered 2020-12-16 – 2020-12-29 (×20): 25 mg via ORAL
  Filled 2020-12-16 (×22): qty 2

## 2020-12-16 MED ORDER — APIXABAN 5 MG PO TABS
5.0000 mg | ORAL_TABLET | Freq: Two times a day (BID) | ORAL | Status: DC
Start: 2020-12-16 — End: 2020-12-29
  Administered 2020-12-16 – 2020-12-29 (×25): 5 mg via ORAL
  Filled 2020-12-16 (×29): qty 1

## 2020-12-16 NOTE — Consult Note (Signed)
[facility] Psychiatry Consult   Reason for Consult: Follow-up consult for this 70 year old man who was admitted to the hospital after an intentional overdose of antidepressant medicine Referring Physician:  [place] Patient Identification: Jeremy Reese MRN:  [id number] Principal Diagnosis: Overdose Diagnosis:  Principal Problem:   Overdose Active Problems:   Hyperlipidemia   Chronic atrial fibrillation (HCC)   Chronic diastolic congestive heart failure (HCC)   CAD, multiple vessel   Hypertension   History of TIA (transient ischemic attack)   Spinal stenosis of lumbar region without neurogenic claudication   Depression   Suicide attempt ([place])   Serotonin syndrome   Paroxysmal A-fib (HCC)   Obesity (BMI 30-39.9)   Acute on chronic respiratory failure with hypoxia (HCC)   Total Time spent with patient: 30 minutes  Subjective:   Jeremy Reese is a 70 y.o. male patient admitted with "I guess I'm all right".  HPI: Follow-up note see previous note.  70 year old man with a known history of recent major depression was admitted to the hospital on the 13th after taking an intentional overdose of his antidepressant medication.  He was initially treated for pneumonia and was still a little confused and tremulous.  On reevaluation today he is no longer on oxygen.  Vitals are stabilized.  Patient is alert and oriented.  Continues to have blunt affect.  Minimizes the severity of depression.  Denies acute suicidal intent.  Patient admits that he is feeling better physically.  He has been able to ambulate around his room and to the restroom without dizziness or falls.  He is eating normally.  Past Psychiatric History: Past history of severe major depression with a previous hospitalization just a month or so ago.  Risk to Self:   Risk to Others:   Prior Inpatient Therapy:   Prior Outpatient Therapy:    Past Medical History:  Past Medical History:  Diagnosis Date  . Atrial flutter,  paroxysmal ([place])   . Carotid artery disease without cerebral infarction ([place])   . Coronary artery disease 2011  . Flatback syndrome of thoracolumbar region   . H/O calcium pyrophosphate deposition disease (CPPD)   . History of TIA (transient ischemic attack)   . Hyperlipidemia   . Paroxysmal A-fib ([place])   . Spinal stenosis of lumbar region without neurogenic claudication     Past Surgical History:  Procedure Laterality Date  . BILATERAL CARPAL TUNNEL RELEASE    . CARDIAC CATHETERIZATION    . CIRCUMCISION, NON-NEWBORN    . CLIPPING OF ATRIAL APPENDAGE N/A 09/18/2020   Procedure: CLIPPING OF ATRIAL APPENDAGE USING 110 ATRICURE LAA  EXCLUSION SYSTEM;  Surgeon: Ivin Poot, MD;  Location: [facility];  Service: Open Heart Surgery;  Laterality: N/A;  . CORONARY ANGIOPLASTY WITH STENT PLACEMENT  2011  . CORONARY ARTERY BYPASS GRAFT N/A 09/18/2020   Procedure: CORONARY ARTERY BYPASS GRAFTING (CABG)X 3, ON PUMP, USING LEFT INTERAL MAMMARY ARTERY AND ENDOSCOPICALLY HARVESTED RIGHT GREATER SAPHENOUS VEIN. LIMA TO LAD, SVG TO OM, SVG TO PD;  Surgeon: Ivin Poot, MD;  Location: [facility];  Service: Open Heart Surgery;  Laterality: N/A;  . ENDOVEIN HARVEST OF GREATER SAPHENOUS VEIN Right 09/18/2020   Procedure: ENDOVEIN HARVEST OF GREATER SAPHENOUS VEIN;  Surgeon: Ivin Poot, MD;  Location: [facility];  Service: Open Heart Surgery;  Laterality: Right;  . LEFT HEART CATH AND CORONARY ANGIOGRAPHY Left 09/12/2020   Procedure: LEFT HEART CATH AND CORONARY ANGIOGRAPHY;  Surgeon: Minna Merritts, MD;  Location: [facility]  INVASIVE CV LAB;  Service: Cardiovascular;  Laterality: Left;  . TEE WITHOUT CARDIOVERSION N/A 09/18/2020   Procedure: TRANSESOPHAGEAL ECHOCARDIOGRAM (TEE);  Surgeon: Prescott Gum, Collier Salina, MD;  Location: [facility];  Service: Open Heart Surgery;  Laterality: N/A;   Family History:  Family History  Problem Relation Age of Onset  . Heart disease Mother 49       CABG x 3   . Hypertension Father     Family Psychiatric  History: History of depression Social History:  Social History   Substance and Sexual Activity  Alcohol Use Yes   Comment: rare      Social History   Substance and Sexual Activity  Drug Use No    Social History   Socioeconomic History  . Marital status: Married    Spouse name: Not on file  . Number of children: Not on file  . Years of education: Not on file  . Highest education level: Not on file  Occupational History  . Not on file  Tobacco Use  . Smoking status: Former Smoker    Packs/day: 0.25    Years: 35.00    Pack years: 8.75    Types: Cigarettes    Quit date: 11/29/1993    Years since quitting: 27.0  . Smokeless tobacco: Never Used  Vaping Use  . Vaping Use: Never used  Substance and Sexual Activity  . Alcohol use: Yes    Comment: rare   . Drug use: No  . Sexual activity: Not on file  Other Topics Concern  . Not on file  Social History Narrative  . Not on file   Social Determinants of Health   Financial Resource Strain: Not on file  Food Insecurity: Not on file  Transportation Needs: Not on file  Physical Activity: Not on file  Stress: Not on file  Social Connections: Not on file   Additional Social History:    Allergies:   Allergies  Allergen Reactions  . Propafenone Anxiety  . Codeine Nausea Only  . Penicillins Rash    Labs: No results found for this or any previous visit (from the past 48 hour(s)).  Current Facility-Administered Medications  Medication Dose Route Frequency Provider Last Rate Last Admin  . acetaminophen (TYLENOL) tablet 650 mg  650 mg Oral Q6H PRN Ivor Costa, MD      . albuterol (VENTOLIN HFA) 108 (90 Base) MCG/ACT inhaler 2 puff  2 puff Inhalation Q4H PRN Ivor Costa, MD      . apixaban [name]) tablet 5 mg  5 mg Oral BID Ivor Costa, MD   5 mg at 12/16/20 0831  . aspirin EC tablet 81 mg  81 mg Oral Daily Ivor Costa, MD   81 mg at 12/16/20 0831  . carvedilol (COREG) tablet 25 mg  25 mg Oral BID WC  Ivor Costa, MD   25 mg at 12/16/20 0831  . ezetimibe (ZETIA) tablet 10 mg  10 mg Oral Daily Ivor Costa, MD   10 mg at 12/16/20 [id number]  . feeding supplement (ENSURE ENLIVE / ENSURE PLUS) liquid 237 mL  237 mL Oral BID BM Hall, Carole N, DO   237 mL at 12/16/20 0830  . folic acid (FOLVITE) tablet 1 mg  1 mg Oral Daily [place], Archie Patten N, DO   1 mg at 12/16/20 0831  . guaiFENesin-dextromethorphan (ROBITUSSIN DM) 100-10 MG/5ML syrup 5 mL  5 mL Oral Q4H PRN Sharion Settler, NP   5 mL at 12/12/20 1753  . hydrALAZINE (APRESOLINE) injection  5 mg  5 mg Intravenous Q2H PRN Ivor Costa, MD      . hydrocortisone (ANUSOL-HC) suppository 25 mg  25 mg Rectal BID Irene Pap N, DO   25 mg at 12/14/20 2303  . lidocaine (LIDODERM) 5 % 1 patch  1 patch Transdermal Q24H Ivor Costa, MD   1 patch at 12/14/20 1139  . LORazepam (ATIVAN) tablet 1-4 mg  1-4 mg Oral Q1H PRN Irene Pap N, DO   1 mg at 12/15/20 1323   Or  . LORazepam (ATIVAN) injection 1-4 mg  1-4 mg Intravenous Q1H PRN Irene Pap N, DO   3 mg at 12/13/20 2130  . LORazepam (ATIVAN) tablet 1 mg  1 mg Oral Q6H PRN Ivor Costa, MD   1 mg at 12/15/20 0852  . multivitamin with minerals tablet 1 tablet  1 tablet Oral Daily Irene Pap N, DO   1 tablet at 12/16/20 0831  . nitroGLYCERIN (NITROSTAT) SL tablet 0.4 mg  0.4 mg Sublingual Q5 min PRN Ivor Costa, MD      . phenylephrine-shark liver oil-mineral oil-petrolatum (PREPARATION H) rectal ointment 1 application  1 application Rectal BID PRN Irene Pap N, DO      . polyethylene glycol (MIRALAX / GLYCOLAX) packet 17 g  17 g Oral Daily Dwyane Dee, MD      . promethazine (PHENERGAN) injection 12.5 mg  12.5 mg Intravenous Q8H PRN Ivor Costa, MD      . rosuvastatin (CRESTOR) tablet 40 mg  40 mg Oral Daily Ivor Costa, MD   40 mg at 12/16/20 0830  . thiamine tablet 100 mg  100 mg Oral Daily Irene Pap N, DO   100 mg at 12/16/20 9629   Or  . thiamine (B-1) injection 100 mg  100 mg Intravenous Daily Kayleen Memos,  DO        Musculoskeletal: Strength & Muscle Tone: within normal limits Gait & Station: normal Patient leans: N/A  Psychiatric Specialty Exam: Physical Exam Vitals and nursing note reviewed.  Constitutional:      Appearance: He is well-developed and well-nourished.  HENT:     Head: Normocephalic and atraumatic.  Eyes:     Conjunctiva/sclera: Conjunctivae normal.     Pupils: Pupils are equal, round, and reactive to light.  Cardiovascular:     Heart sounds: Normal heart sounds.  Pulmonary:     Effort: Pulmonary effort is normal.  Abdominal:     Palpations: Abdomen is soft.  Musculoskeletal:        General: Normal range of motion.     Cervical back: Normal range of motion.  Skin:    General: Skin is warm and dry.  Neurological:     Mental Status: He is alert.     Motor: Tremor present.     Comments: Patient still has a visible tremor but it is not currently impairing.  He was able to feed himself without any difficulty.  Psychiatric:        Attention and Perception: Attention normal.        Mood and Affect: Mood is depressed. Affect is blunt.        Speech: Speech is delayed.        Behavior: Behavior is slowed.        Thought Content: Thought content includes suicidal ideation. Thought content does not include suicidal plan.        Cognition and Memory: Cognition is impaired. Memory is impaired.     Review of Systems  Constitutional: Negative.   HENT: Negative.   Eyes: Negative.   Respiratory: Negative.   Cardiovascular: Negative.   Gastrointestinal: Negative.   Musculoskeletal: Negative.   Skin: Negative.   Neurological: Negative.   Psychiatric/Behavioral: Positive for dysphoric mood and suicidal ideas.    Blood pressure (!) 138/95, pulse 72, temperature 97.7 F (36.5 C), temperature source Oral, resp. rate 18, height 5\' 11"  (1.803 m), weight 103.9 kg, SpO2 95 %.Body mass index is 31.94 kg/m.  General Appearance: Casual  Eye Contact:  Fair  Speech:  Slow   Volume:  Decreased  Mood:  Dysphoric  Affect:  Congruent  Thought Process:  Coherent  Orientation:  Full (Time, Place, and Person)  Thought Content:  Logical and Rumination  Suicidal Thoughts:  Yes.  without intent/plan  Homicidal Thoughts:  No  Memory:  Immediate;   Fair Recent;   Poor Remote;   Fair  Judgement:  Impaired  Insight:  Shallow  Psychomotor Activity:  Decreased  Concentration:  Concentration: Fair  Recall:  [employer] of Knowledge:  Fair  Language:  Fair  Akathisia:  No  Handed:  Right  AIMS (if indicated):     Assets:  Desire for Improvement Housing Resilience Social Support  ADL's:  Intact  Cognition:  WNL  Sleep:        Treatment Plan Summary: Medication management and Plan Case reviewed.  Patient's medical issues appear to have stabilized to the point that he would be safe for transfer to the psychiatric unit.  We need to recheck a COVID test today and I will place that order.  Pending the result of that we can plan on possible admission later this afternoon.  Patient advised of the plan.  Continue IVC.  Disposition: Recommend psychiatric Inpatient admission when medically cleared.  Alethia Berthold, MD 12/16/2020 1:49 PM

## 2020-12-16 NOTE — Progress Notes (Signed)
Patient calm and compliant during assessment denying HI/AVH. Patient endorses anxiety and depression. Patient endorses passive SI, verbally contracts for safety. Patient observed interacting appropriately with staff and peers on the unit. Patient didn't have any night medications scheduled and didn't request anything PRN. Patient given education, support, and encouragement to be active in his treatment plan. Patient being monitored Q 15 minutes for safety per unit protocol. Pt remains safe on the unit.

## 2020-12-16 NOTE — Tx Team (Signed)
Initial Treatment Plan 12/16/2020 6:43 PM Jeremy Reese PPI:[id number]    PATIENT STRESSORS: Financial difficulties Health problems   PATIENT STRENGTHS: Motivation for treatment/growth Supportive family/friends   PATIENT IDENTIFIED PROBLEMS: Ineffective coping skills  Medication Abuse                   DISCHARGE CRITERIA:  Improved stabilization in mood, thinking, and/or behavior Motivation to continue treatment in a less acute level of care  PRELIMINARY DISCHARGE PLAN: Outpatient therapy Return to previous living arrangement  PATIENT/FAMILY INVOLVEMENT: This treatment plan has been presented to and reviewed with the patient, Jeremy Reese, and/or family member.  The patient and family have been given the opportunity to ask questions and make suggestions.  Aleen Sells, RN 12/16/2020, 6:43 PM

## 2020-12-16 NOTE — Progress Notes (Signed)
   12/15/20 0118  Assess: MEWS Score  Temp 98.1 F (36.7 C)  BP (!) 133/103  Pulse Rate 94  Resp (!) 24  SpO2 97 %  O2 Device Room Air  Assess: MEWS Score  MEWS Temp 0  MEWS Systolic 0  MEWS Pulse 0  MEWS RR 1  MEWS LOC 1  MEWS Score 2  MEWS Score Color Yellow  Assess: if the MEWS score is Yellow or Red  Were vital signs taken at a resting state? Yes  Focused Assessment Change from prior assessment (see assessment flowsheet)  Early Detection of Sepsis Score *See Row Information* Low  MEWS guidelines implemented *See Row Information* Yes  Treat  MEWS Interventions Other (Comment) (will continue to monitor)  Pain Scale 0-10  Pain Score 0  Take Vital Signs  Increase Vital Sign Frequency  Yellow: Q 2hr X 2 then Q 4hr X 2, if remains yellow, continue Q 4hrs  Escalate  MEWS: Escalate Yellow: discuss with charge nurse/RN and consider discussing with provider and RRT  Notify: Charge Nurse/RN  Name of Charge Nurse/RN Notified Microbiologist  Date Charge Nurse/RN Notified 12/15/20  Time Charge Nurse/RN Notified 0143  Notify: Provider  Provider Name/Title Sharion Settler NP  Date Provider Notified 12/15/20  Time Provider Notified [phone]  Notification Type  (Secure Chat)  Notification Reason Other (Comment) (Increased RR)  Response No new orders  Date of Provider Response 12/15/20  Time of Provider Response [phone]

## 2020-12-16 NOTE — Care Management Important Message (Signed)
Important Message  Patient Details  Name: Jeremy Reese MRN: [id number] Date of Birth: 05-06-1951   Medicare Important Message Given:  Yes     Juliann Pulse A Sidra [place] 12/16/2020, 10:58 AM

## 2020-12-16 NOTE — Plan of Care (Signed)

## 2020-12-16 NOTE — BH Assessment (Addendum)
Patient can come down after negative COVID results are in   Call to give report: [phone]  Patient is to be admitted to [facility] by Dr. Domingo Cocking.  Attending Physician will be. Dr. Domingo Cocking.   Patient has been assigned to room 301, by [place] Nurse Arlyss Repress, RN.   Intake Paper Work has been signed and placed on patient chart.   Medical Floor staff is aware of the admission:  1. [place] Pink, MD  2. Jonelle Sidle, Patient's Nurse 3. Jarrett Soho, NT  4. THO Patient Access.

## 2020-12-16 NOTE — Discharge Summary (Signed)
Physician Discharge Summary   Jeremy Reese [id number] DOB: 1951-05-28 DOA: 12/11/2020  PCP: Cipriano Mile, NP  Admit date: 12/11/2020 Discharge date: 12/16/2020  Admitted From: home Disposition:  Inpatient psych  Discharging physician: Dwyane Dee, MD  Recommendations for Outpatient Follow-up:  1. Patient discharged to inpatient psych facility   Patient discharged to [facility] in Discharge Condition: stable CODE STATUS: Full Diet recommendation:  Diet Orders (From admission, onward)    Start     Ordered   12/16/20 0000  Diet - low sodium heart healthy        12/16/20 1659   12/11/20 2115  Diet Heart Room service appropriate? Yes; Fluid consistency: Thin  Diet effective now       Question Answer Comment  Room service appropriate? Yes   Fluid consistency: Thin      12/11/20 2115          Hospital Course:  Patient is 70 year old male with past medical history of depression, hypertension, obesity, diastolic CHF, CAD status post CABG and PAD who early morning of 12/11/20 took 24 Prozac 40 mg pills and 15 Remeron 15 mg pills with the intent to kill himself. He started feeling groggy and called EMS.  On work-up he was noted to be mildly hypoxic and was placed on oxygen which was ultimately be able to be weaned off.  CXR head showed bilateral infiltrates.  COVID and flu swabs were negative.  PCT negative; he was not considered to have active PNA and abx were stopped and vitals/clinical status remained stable.   There was concern for mild serotonin syndrome given tremors noted in his hands with his overdose and mild variation in BP.   With ongoing monitoring, he stabilized further and continued to improve.  He was evaluated by psychiatry during hospitalization and was placed under an IVC.  He was recommended for discharging to inpatient psychiatry for ongoing treatment.   Acute metabolic encephalopathy - resolved - considered 2/2 OD  Improving acute transaminitis,  likely related to his overdose LFTs are down-trending Continue to avoid hepatotoxic agents  Chronic atrial fibrillation [facility]):  EKG on admission noted to be in atrial fibrillation.  Rate controlled on coreg, continue. Continue Eliquis  Chronic diastolic congestive heart failure ([place]):  BNP on admission minimally elevated.Repeat BNP much higher at almost 600. Received IV Lasix, discontinued on 12/13/2020  CAD, multiple vesselwith history of stent placement and CABG:  Denies any anginal symptoms at the time of this visit.  Continue aspirin and Crestor.  Hypertension: Continue Coreg 25 mg twice daily  History of TIA (transient ischemic attack) Spinal stenosis of lumbar region without neurogenic claudication   Obesity (BMI 30-39.9):  Meets criteria BMI greater than 30.  Chronic insomnia/anxiety Per his wife he was taking xanax qhs  Has hx of chronic insomnia and anxiety  Concern for benzodiazepine withdrawal UDS positive for benzodiazepines and PCP PCP is likely a false positive. He denies use of street drugs   Principal Diagnosis: Overdose  Discharge Diagnoses: Active Hospital Problems   Diagnosis Date Noted  . Overdose 12/11/2020  . Acute on chronic respiratory failure with hypoxia ([place]) 12/12/2020  . Paroxysmal A-fib ([place])   . Obesity (BMI 30-39.9)   . Depression 12/11/2020  . Suicide attempt ([place]) 12/11/2020  . Serotonin syndrome 12/11/2020  . CAD, multiple vessel 09/12/2020  . Chronic diastolic congestive heart failure ([place]) 04/29/2015  . Chronic atrial fibrillation ([place]) 04/01/2015  . Hyperlipidemia 04/01/2015  . History of TIA (transient ischemic attack) 03/27/2015  .  Hypertension 03/27/2015  . Spinal stenosis of lumbar region without neurogenic claudication 06/05/2014    Resolved Hospital Problems  No resolved problems to display.    Discharge Instructions    Diet - low sodium heart healthy   Complete by: As directed    Increase activity  slowly   Complete by: As directed      Allergies as of 12/16/2020      Reactions   Propafenone Anxiety   Codeine Nausea Only   Penicillins Rash      Medication List    STOP taking these medications   Anucort-HC 25 MG suppository Generic drug: hydrocortisone   FLUoxetine 40 MG capsule Commonly known as: PROZAC   mirtazapine 15 MG tablet Commonly known as: REMERON     TAKE these medications   aspirin EC 81 MG tablet Take 1 tablet (81 mg total) by mouth daily.   carvedilol 25 MG tablet Commonly known as: COREG Take 1 tablet (25 mg total) by mouth 2 (two) times daily with a meal.   Eliquis 5 MG Tabs tablet Generic drug: apixaban TAKE ONE TABLET TWICE DAILY   ezetimibe 10 MG tablet Commonly known as: ZETIA TAKE ONE (1) TABLET EACH DAY   gabapentin 400 MG capsule Commonly known as: NEURONTIN Take 400 mg by mouth 3 (three) times daily.   nitroGLYCERIN 0.4 MG SL tablet Commonly known as: NITROSTAT Place 1 tablet (0.4 mg total) under the tongue every 5 (five) minutes as needed for chest pain.   QC Stool Softener Pls Laxative 8.6-50 MG tablet Generic drug: senna-docusate Take 2 tablets by mouth daily.   rosuvastatin 40 MG tablet Commonly known as: CRESTOR Take 1 tablet (40 mg total) by mouth daily.       Allergies  Allergen Reactions  . Propafenone Anxiety  . Codeine Nausea Only  . Penicillins Rash    Consultations: Psychiatry  Discharge Exam: BP (!) 138/95 (BP Location: Left Arm)   Pulse 72   Temp 97.7 F (36.5 C) (Oral)   Resp 18   Ht 5\' 11"  (1.803 m)   Wt 103.9 kg   SpO2 95%   BMI 31.94 kg/m  General appearance: Grossly normal Head: Normocephalic, without obvious abnormality, atraumatic Eyes: EOMI Lungs: clear to auscultation bilaterally Heart: irregularly irregular rhythm and S1, S2 normal Abdomen: normal findings: bowel sounds normal and soft, non-tender Extremities: No edema Skin: mobility and turgor normal Neurologic: Grossly  normal  The results of significant diagnostics from this hospitalization (including imaging, microbiology, ancillary and laboratory) are listed below for reference.   Microbiology: Recent Results (from the past 240 hour(s))  Resp Panel by RT-PCR (Flu A&B, Covid) Nasopharyngeal Swab     Status: None   Collection Time: 12/11/20  6:53 AM   Specimen: Nasopharyngeal Swab; Nasopharyngeal(NP) swabs in vial transport medium  Result Value Ref Range Status   SARS Coronavirus 2 by RT PCR NEGATIVE NEGATIVE Final    Comment: (NOTE) SARS-CoV-2 target nucleic acids are NOT DETECTED.  The SARS-CoV-2 RNA is generally detectable in upper respiratory specimens during the acute phase of infection. The lowest concentration of SARS-CoV-2 viral copies this assay can detect is 138 copies/mL. A negative result does not preclude SARS-Cov-2 infection and should not be used as the sole basis for treatment or other patient management decisions. A negative result may occur with  improper specimen collection/handling, submission of specimen other than nasopharyngeal swab, presence of viral mutation(s) within the areas targeted by this assay, and inadequate number of viral copies(<138 copies/mL).  A negative result must be combined with clinical observations, patient history, and epidemiological information. The expected result is Negative.  Fact Sheet for Patients:  EntrepreneurPulse.com.au  Fact Sheet for Healthcare Providers:  IncredibleEmployment.be  This test is no t yet approved or cleared by the [place] FDA and  has been authorized for detection and/or diagnosis of SARS-CoV-2 by FDA under an Emergency Use Authorization (EUA). This EUA will remain  in effect (meaning this test can be used) for the duration of the COVID-19 declaration under Section 564(b)(1) of the Act, 21 U.S.C.section 360bbb-3(b)(1), unless the authorization is terminated  or revoked sooner.        Influenza A by PCR NEGATIVE NEGATIVE Final   Influenza B by PCR NEGATIVE NEGATIVE Final    Comment: (NOTE) The Xpert Xpress SARS-CoV-2/FLU/RSV plus assay is intended as an aid in the diagnosis of influenza from Nasopharyngeal swab specimens and should not be used as a sole basis for treatment. Nasal washings and aspirates are unacceptable for Xpert Xpress SARS-CoV-2/FLU/RSV testing.  Fact Sheet for Patients: EntrepreneurPulse.com.au  Fact Sheet for Healthcare Providers: IncredibleEmployment.be  This test is not yet approved or cleared by the [place] FDA and has been authorized for detection and/or diagnosis of SARS-CoV-2 by FDA under an Emergency Use Authorization (EUA). This EUA will remain in effect (meaning this test can be used) for the duration of the COVID-19 declaration under Section 564(b)(1) of the Act, 21 U.S.C. section 360bbb-3(b)(1), unless the authorization is terminated or revoked.  Performed at [facility], [address] [place], [place] [address]   CULTURE, BLOOD (ROUTINE X 2) w Reflex to ID Panel     Status: None   Collection Time: 12/11/20 12:20 PM   Specimen: BLOOD  Result Value Ref Range Status   Specimen Description BLOOD BLOOD RIGHT FOREARM  Final   Special Requests   Final    BOTTLES DRAWN AEROBIC AND ANAEROBIC Blood Culture adequate volume   Culture   Final    NO GROWTH 5 DAYS Performed at [facility], [address] [place], [place] [address]    Report Status 12/16/2020 FINAL  Final  CULTURE, BLOOD (ROUTINE X 2) w Reflex to ID Panel     Status: None   Collection Time: 12/11/20 12:20 PM   Specimen: BLOOD  Result Value Ref Range Status   Specimen Description BLOOD RIGHT ANTECUBITAL  Final   Special Requests   Final    BOTTLES DRAWN AEROBIC AND ANAEROBIC Blood Culture adequate volume   Culture   Final    NO GROWTH 5 DAYS Performed at [facility], [address] [place], [place] [address]    Report Status 12/16/2020 FINAL  Final  Resp Panel by RT-PCR (Flu A&B, Covid) Nasopharyngeal Swab     Status: None   Collection Time: 12/16/20  2:04 PM   Specimen: Nasopharyngeal Swab; Nasopharyngeal(NP) swabs in vial transport medium  Result Value Ref Range Status   SARS Coronavirus 2 by RT PCR NEGATIVE NEGATIVE Final    Comment: (NOTE) SARS-CoV-2 target nucleic acids are NOT DETECTED.  The SARS-CoV-2 RNA is generally detectable in upper respiratory specimens during the acute phase of infection. The lowest concentration of SARS-CoV-2 viral copies this assay can detect is 138 copies/mL. A negative result does not preclude SARS-Cov-2 infection and should not be used as the sole basis for treatment or other patient management decisions. A negative result may occur with  improper specimen collection/handling, submission of specimen other than nasopharyngeal  swab, presence of viral mutation(s) within the areas targeted by this assay, and inadequate number of viral copies(<138 copies/mL). A negative result must be combined with clinical observations, patient history, and epidemiological information. The expected result is Negative.  Fact Sheet for Patients:  EntrepreneurPulse.com.au  Fact Sheet for Healthcare Providers:  IncredibleEmployment.be  This test is no t yet approved or cleared by the [place] FDA and  has been authorized for detection and/or diagnosis of SARS-CoV-2 by FDA under an Emergency Use Authorization (EUA). This EUA will remain  in effect (meaning this test can be used) for the duration of the COVID-19 declaration under Section 564(b)(1) of the Act, 21 U.S.C.section 360bbb-3(b)(1), unless the authorization is terminated  or revoked sooner.       Influenza A by PCR NEGATIVE NEGATIVE Final   Influenza B by PCR NEGATIVE NEGATIVE Final    Comment: (NOTE) The Xpert Xpress SARS-CoV-2/FLU/RSV plus  assay is intended as an aid in the diagnosis of influenza from Nasopharyngeal swab specimens and should not be used as a sole basis for treatment. Nasal washings and aspirates are unacceptable for Xpert Xpress SARS-CoV-2/FLU/RSV testing.  Fact Sheet for Patients: EntrepreneurPulse.com.au  Fact Sheet for Healthcare Providers: IncredibleEmployment.be  This test is not yet approved or cleared by the [place] FDA and has been authorized for detection and/or diagnosis of SARS-CoV-2 by FDA under an Emergency Use Authorization (EUA). This EUA will remain in effect (meaning this test can be used) for the duration of the COVID-19 declaration under Section 564(b)(1) of the Act, 21 U.S.C. section 360bbb-3(b)(1), unless the authorization is terminated or revoked.  Performed at [facility], [address] [place],  [address]      Labs: BNP (last 3 results) Recent Labs    12/11/20 0632 12/12/20 1632  BNP 150.9* 0000000*   Basic Metabolic Panel: Recent Labs  Lab 12/11/20 0632 12/12/20 0556 12/13/20 0605 12/13/20 1732 12/14/20 0701  NA 137 142 143 142 142  K 4.0 4.4 4.6 3.9 3.7  CL 102 108 103 100 102  CO2 24 24 28 28 30   GLUCOSE 157* 139* 108* 102* 102*  BUN 16 24* 34* 40* 43*  CREATININE 0.94 1.08 1.20 1.29* 1.29*  CALCIUM 9.6 9.7 9.8 10.0 9.4  MG 2.3  --   --  2.3  --   PHOS  --   --   --  3.9  --    Liver Function Tests: Recent Labs  Lab 12/11/20 0632 12/13/20 1732 12/14/20 0701  AST 26 137* 69*  ALT 31 133* 98*  ALKPHOS 57 66 57  BILITOT 1.1 1.3* 1.1  PROT 7.1 7.5 6.7  ALBUMIN 4.1 4.5 3.8   No results for input(s): LIPASE, AMYLASE in the last 168 hours. Recent Labs  Lab 12/14/20 0701  AMMONIA 13   CBC: Recent Labs  Lab 12/11/20 0632 12/12/20 0556 12/13/20 0605 12/13/20 1732  WBC 8.5 19.3* 13.5* 13.6*  NEUTROABS 6.2  --   --   --   HGB 14.8 15.7 14.7 15.8  HCT 45.7 46.9 45.7 48.5  MCV 97.9  95.5 98.7 96.6  PLT 202 210 168 184   Cardiac Enzymes: No results for input(s): CKTOTAL, CKMB, CKMBINDEX, TROPONINI in the last 168 hours. BNP: Invalid input(s): POCBNP CBG: Recent Labs  Lab 12/11/20 0631  GLUCAP 163*   D-Dimer No results for input(s): DDIMER in the last 72 hours. Hgb A1c No results for input(s): HGBA1C in the last 72 hours. Lipid Profile No results  for input(s): CHOL, HDL, LDLCALC, TRIG, CHOLHDL, LDLDIRECT in the last 72 hours. Thyroid function studies No results for input(s): TSH, T4TOTAL, T3FREE, THYROIDAB in the last 72 hours.  Invalid input(s): FREET3 Anemia work up No results for input(s): VITAMINB12, FOLATE, FERRITIN, TIBC, IRON, RETICCTPCT in the last 72 hours. Urinalysis    Component Value Date/Time   COLORURINE YELLOW (A) 11/11/2020 1314   APPEARANCEUR CLEAR (A) 11/11/2020 1314   LABSPEC 1.018 11/11/2020 1314   PHURINE 6.0 11/11/2020 1314   GLUCOSEU NEGATIVE 11/11/2020 1314   HGBUR NEGATIVE 11/11/2020 1314   BILIRUBINUR NEGATIVE 11/11/2020 1314   KETONESUR NEGATIVE 11/11/2020 1314   PROTEINUR 30 (A) 11/11/2020 1314   NITRITE NEGATIVE 11/11/2020 1314   LEUKOCYTESUR NEGATIVE 11/11/2020 1314   Sepsis Labs Invalid input(s): PROCALCITONIN,  WBC,  LACTICIDVEN Microbiology Recent Results (from the past 240 hour(s))  Resp Panel by RT-PCR (Flu A&B, Covid) Nasopharyngeal Swab     Status: None   Collection Time: 12/11/20  6:53 AM   Specimen: Nasopharyngeal Swab; Nasopharyngeal(NP) swabs in vial transport medium  Result Value Ref Range Status   SARS Coronavirus 2 by RT PCR NEGATIVE NEGATIVE Final    Comment: (NOTE) SARS-CoV-2 target nucleic acids are NOT DETECTED.  The SARS-CoV-2 RNA is generally detectable in upper respiratory specimens during the acute phase of infection. The lowest concentration of SARS-CoV-2 viral copies this assay can detect is 138 copies/mL. A negative result does not preclude SARS-Cov-2 infection and should not be used as  the sole basis for treatment or other patient management decisions. A negative result may occur with  improper specimen collection/handling, submission of specimen other than nasopharyngeal swab, presence of viral mutation(s) within the areas targeted by this assay, and inadequate number of viral copies(<138 copies/mL). A negative result must be combined with clinical observations, patient history, and epidemiological information. The expected result is Negative.  Fact Sheet for Patients:  EntrepreneurPulse.com.au  Fact Sheet for Healthcare Providers:  IncredibleEmployment.be  This test is no t yet approved or cleared by the [place] FDA and  has been authorized for detection and/or diagnosis of SARS-CoV-2 by FDA under an Emergency Use Authorization (EUA). This EUA will remain  in effect (meaning this test can be used) for the duration of the COVID-19 declaration under Section 564(b)(1) of the Act, 21 U.S.C.section 360bbb-3(b)(1), unless the authorization is terminated  or revoked sooner.       Influenza A by PCR NEGATIVE NEGATIVE Final   Influenza B by PCR NEGATIVE NEGATIVE Final    Comment: (NOTE) The Xpert Xpress SARS-CoV-2/FLU/RSV plus assay is intended as an aid in the diagnosis of influenza from Nasopharyngeal swab specimens and should not be used as a sole basis for treatment. Nasal washings and aspirates are unacceptable for Xpert Xpress SARS-CoV-2/FLU/RSV testing.  Fact Sheet for Patients: EntrepreneurPulse.com.au  Fact Sheet for Healthcare Providers: IncredibleEmployment.be  This test is not yet approved or cleared by the [place] FDA and has been authorized for detection and/or diagnosis of SARS-CoV-2 by FDA under an Emergency Use Authorization (EUA). This EUA will remain in effect (meaning this test can be used) for the duration of the COVID-19 declaration under Section 564(b)(1) of  the Act, 21 U.S.C. section 360bbb-3(b)(1), unless the authorization is terminated or revoked.  Performed at [facility], [place], [place] [address]   CULTURE, BLOOD (ROUTINE X 2) w Reflex to ID Panel     Status: None   Collection Time: 12/11/20 12:20 PM   Specimen: BLOOD  Result Value Ref Range Status   Specimen Description BLOOD BLOOD RIGHT FOREARM  Final   Special Requests   Final    BOTTLES DRAWN AEROBIC AND ANAEROBIC Blood Culture adequate volume   Culture   Final    NO GROWTH 5 DAYS Performed at [facility], [address] [place], [place] [address]    Report Status 12/16/2020 FINAL  Final  CULTURE, BLOOD (ROUTINE X 2) w Reflex to ID Panel     Status: None   Collection Time: 12/11/20 12:20 PM   Specimen: BLOOD  Result Value Ref Range Status   Specimen Description BLOOD RIGHT ANTECUBITAL  Final   Special Requests   Final    BOTTLES DRAWN AEROBIC AND ANAEROBIC Blood Culture adequate volume   Culture   Final    NO GROWTH 5 DAYS Performed at [facility], [address] [place], [place] [address]    Report Status 12/16/2020 FINAL  Final  Resp Panel by RT-PCR (Flu A&B, Covid) Nasopharyngeal Swab     Status: None   Collection Time: 12/16/20  2:04 PM   Specimen: Nasopharyngeal Swab; Nasopharyngeal(NP) swabs in vial transport medium  Result Value Ref Range Status   SARS Coronavirus 2 by RT PCR NEGATIVE NEGATIVE Final    Comment: (NOTE) SARS-CoV-2 target nucleic acids are NOT DETECTED.  The SARS-CoV-2 RNA is generally detectable in upper respiratory specimens during the acute phase of infection. The lowest concentration of SARS-CoV-2 viral copies this assay can detect is 138 copies/mL. A negative result does not preclude SARS-Cov-2 infection and should not be used as the sole basis for treatment or other patient management decisions. A negative result may occur with  improper specimen collection/handling, submission of  specimen other than nasopharyngeal swab, presence of viral mutation(s) within the areas targeted by this assay, and inadequate number of viral copies(<138 copies/mL). A negative result must be combined with clinical observations, patient history, and epidemiological information. The expected result is Negative.  Fact Sheet for Patients:  EntrepreneurPulse.com.au  Fact Sheet for Healthcare Providers:  IncredibleEmployment.be  This test is no t yet approved or cleared by the [place] FDA and  has been authorized for detection and/or diagnosis of SARS-CoV-2 by FDA under an Emergency Use Authorization (EUA). This EUA will remain  in effect (meaning this test can be used) for the duration of the COVID-19 declaration under Section 564(b)(1) of the Act, 21 U.S.C.section 360bbb-3(b)(1), unless the authorization is terminated  or revoked sooner.       Influenza A by PCR NEGATIVE NEGATIVE Final   Influenza B by PCR NEGATIVE NEGATIVE Final    Comment: (NOTE) The Xpert Xpress SARS-CoV-2/FLU/RSV plus assay is intended as an aid in the diagnosis of influenza from Nasopharyngeal swab specimens and should not be used as a sole basis for treatment. Nasal washings and aspirates are unacceptable for Xpert Xpress SARS-CoV-2/FLU/RSV testing.  Fact Sheet for Patients: EntrepreneurPulse.com.au  Fact Sheet for Healthcare Providers: IncredibleEmployment.be  This test is not yet approved or cleared by the [place] FDA and has been authorized for detection and/or diagnosis of SARS-CoV-2 by FDA under an Emergency Use Authorization (EUA). This EUA will remain in effect (meaning this test can be used) for the duration of the COVID-19 declaration under Section 564(b)(1) of the Act, 21 U.S.C. section 360bbb-3(b)(1), unless the authorization is terminated or revoked.  Performed at [facility], [address] [place], [place] [address]     Procedures/Studies: [facility] [place] [address]  Result Date: 12/11/2020 CLINICAL DATA:  Shortness of breath EXAM: PORTABLE CHEST 1 VIEW COMPARISON:  October 29, 2020 FINDINGS: Ill-defined airspace opacity is noted in portions of the right upper lobe and right mid lung region. No consolidation. Left lung clear. Heart is mildly enlarged with pulmonary venous hypertension. Patient is status post coronary artery bypass grafting. There is a left atrial appendage clamp. No adenopathy. No appreciable bone lesions. IMPRESSION: 1. Ill-defined airspace opacity right upper lobe and right mid lung regions. Suspect multifocal pneumonia, likely of atypical organism etiology. Correlation with COVID-19 status advised given this appearance. 2. Mild cardiomegaly with pulmonary vascular congestion. Postoperative changes noted. Electronically Signed   By: Lowella Grip III M.D.   On: 12/11/2020 10:26     Time coordinating discharge: Over 3 minutes    Dwyane Dee, MD  [facility] 12/16/2020, 5:00 PM

## 2020-12-16 NOTE — Progress Notes (Signed)
70 year old man with a history of depression brought to the hospital this morning early after taking an overdose of his antidepressant medicine.  Patient estimates around 5:00 AM he took a large number of fluoxetine as well as mirtazapine tablets.  He says he did this intentionally as a suicide attempt.  It is not clear whether he immediately informed his wife or not but the wife did become aware and called 911.  Patient reports that his mood has continued to be depressed since his discharge from the psychiatric ward.  He feels emotionally overwhelmed.  Feels like his stresses are overwhelming.  Sleeping poorly at night.  Energy level very low.  Not working on doing the cardiac reconditioning that he is supposed to be doing.  Having suicidal thoughts.  Denies hallucinations or psychotic symptoms.   Past Psychiatric History: Patient has a history of major depression.  He was an inpatient on our psychiatric ward as recently as December and has had other hospitalizations  prior to that.  He does have a past history of suicide attempts.  No known history of mania or bipolar disorder.  No substance abuse identified.  Patient suffered a myocardial infarction and has had some other complications of arterial disease recently and feels very depressed and overwhelmed about them.   Skin assessment preformed upon admission. There is a surgical scar running down the middle of pt's chx/abdomin and bruising to right lower leg.

## 2020-12-17 DIAGNOSIS — F332 Major depressive disorder, recurrent severe without psychotic features: Principal | ICD-10-CM

## 2020-12-17 MED ORDER — PHENYLEPHRINE-MINERAL OIL-PET 0.25-14-74.9 % RE OINT
1.0000 "application " | TOPICAL_OINTMENT | Freq: Two times a day (BID) | RECTAL | Status: DC | PRN
  Administered 2020-12-22 – 2020-12-27 (×4): 1 via RECTAL
  Filled 2020-12-17 (×2): qty 57

## 2020-12-17 MED ORDER — ENSURE ENLIVE PO LIQD: 237.0000 mL | Freq: Two times a day (BID) | ORAL | Status: DC

## 2020-12-17 MED ORDER — ENSURE ENLIVE PO LIQD
237.0000 mL | Freq: Two times a day (BID) | ORAL | Status: DC
  Administered 2020-12-17 – 2020-12-28 (×19): 237 mL via ORAL

## 2020-12-17 MED ORDER — BUPROPION HCL ER (XL) 150 MG PO TB24
150.0000 mg | ORAL_TABLET | Freq: Every day | ORAL | Status: DC
  Administered 2020-12-17 – 2020-12-25 (×9): 150 mg via ORAL
  Filled 2020-12-17 (×9): qty 1

## 2020-12-17 MED ORDER — ADULT MULTIVITAMIN W/MINERALS CH
1.0000 | ORAL_TABLET | Freq: Every day | ORAL | Status: DC
  Administered 2020-12-18 – 2020-12-29 (×12): 1 via ORAL
  Filled 2020-12-17 (×12): qty 1

## 2020-12-17 MED ORDER — POLYETHYLENE GLYCOL 3350 17 G PO PACK
17.0000 g | PACK | Freq: Every day | ORAL | Status: DC
  Administered 2020-12-17 – 2020-12-29 (×10): 17 g via ORAL
  Filled 2020-12-17 (×11): qty 1

## 2020-12-17 NOTE — Progress Notes (Signed)
Patient called to nurses station stating, "I am dying". Patient assessed and he stated he needed some water and something to eat. Patient endorsing anxiety, check MAR. Patient continuing to be monitored Q 15 minutes for safety per unit protocol. Pt remains safe on the unit.

## 2020-12-17 NOTE — Progress Notes (Signed)
   12/17/20 1410  Clinical Encounter Type  Visited With Patient  Visit Type Follow-up  Referral From Nurse  Consult/Referral To Chaplain   I attempted to follow up with Jeremy Reese. He was sleeping.  [place], [place]

## 2020-12-17 NOTE — Progress Notes (Signed)
D: Pt alert and oriented. Pt denies experiencing any depression at this time however endorses anxiety as a 10/10. Pt denies experiencing any pain at this time. Pt denies experiencing any SI/HI, or AVH at this time.   Pt refuses to get out of bed for medications or meals. Pt states he's unable to walk that he's too weak. MD has been notified of pt's disposition and placed orders for PT consult.   A: Scheduled medications administered to pt, per MD orders. Support and encouragement provided. Frequent verbal contact made. Routine safety checks conducted q15 minutes.   R: No adverse drug reactions noted. Pt verbally contracts for safety at this time. Pt complaint with medications. Pt interacts minimally with others on the unit, pt continues to stay in bed regardless of attempts and conversations with pt on importance of being mobile. Pt remains safe at this time. Will continue to monitor.

## 2020-12-17 NOTE — [facility] Suicide Risk Assessment (Signed)
[facility] Admission Suicide Risk Assessment   Nursing information obtained from:    Demographic factors:  Male,Age 70 or older,Caucasian,Low socioeconomic status,Unemployed Current Mental Status:  Self-harm behaviors Loss Factors:  Decline in physical health,Financial problems / change in socioeconomic status Historical Factors:  Prior suicide attempts Risk Reduction Factors:  Living with another person, especially a relative,Positive social support  Total Time spent with patient: 1 hour Principal Problem: MDD (major depressive disorder), recurrent severe, without psychosis ([place]) Diagnosis:  Principal Problem:   MDD (major depressive disorder), recurrent severe, without psychosis ([place]) Active Problems:   Hyperlipidemia   Chronic atrial fibrillation (HCC)   Chronic diastolic congestive heart failure (HCC)   Flatback syndrome of thoracolumbar region   Hypertension   Suicide attempt [facility])  Subjective Data: 70 year old male with history of MDD and GAD presenting after intentional ingestion of Prozac and Remeron resulting in serotonin syndrome and medical admission. At this time patient continues to endorse depressed mood, poor sleep, low energy, anhedonia, hopelessness, helplessness, worthlessness, and passive suicidal ideations. He states that after he got his booster Moderna injection he began to have a skin rash and had a hemrrohoid flare. This made it painful for him to sit or stand, and caused him to be unable to sleep for three nights. He notes at that time he no longer cared if he lived or died, and took a hand full of Prozac and Remeron. While in the ED, he admitted this was a suicide attempt. However, at this time he is minimizing the attempt and stating he was trying to get to sleep. Discussed RBA of Wellbutrin for treatment of depression to avoid serotonergic agents. He is agreeable with this plan of care. Will also order PT consult for generalized weakness and deconditioning.   Continued  Clinical Symptoms:  Alcohol Use Disorder Identification Test Final Score (AUDIT): 0 The "Alcohol Use Disorders Identification Test", Guidelines for Use in Primary Care, Second Edition.  World Pharmacologist [facility]). Score between 0-7:  no or low risk or alcohol related problems. Score between 8-15:  moderate risk of alcohol related problems. Score between 16-19:  high risk of alcohol related problems. Score 20 or above:  warrants further diagnostic evaluation for alcohol dependence and treatment.   CLINICAL FACTORS:   Severe Anxiety and/or Agitation Depression:   Anhedonia Hopelessness Impulsivity Insomnia Severe Chronic Pain More than one psychiatric diagnosis Unstable or Poor Therapeutic Relationship   Musculoskeletal: Strength & Muscle Tone: decreased Gait & Station: unsteady Patient leans: Front  Psychiatric Specialty Exam: Physical Exam Vitals and nursing note reviewed.  Constitutional:      Appearance: Normal appearance.  HENT:     Head: Normocephalic and atraumatic.     Right Ear: External ear normal.     Left Ear: External ear normal.     Nose: Nose normal.     Mouth/Throat:     Mouth: Mucous membranes are moist.     Pharynx: Oropharynx is clear.  Eyes:     Extraocular Movements: Extraocular movements intact.     Pupils: Pupils are equal, round, and reactive to light.  Cardiovascular:     Rate and Rhythm: Normal rate.     Pulses: Normal pulses.  Pulmonary:     Effort: Pulmonary effort is normal.  Abdominal:     General: Abdomen is flat.     Palpations: Abdomen is soft.  Musculoskeletal:        General: Deformity present. No swelling.     Cervical back: Normal range of motion  and neck supple.  Skin:    General: Skin is warm and dry.  Neurological:     General: No focal deficit present.     Mental Status: He is alert and oriented to person, place, and time.  Psychiatric:        Attention and Perception: Attention and perception normal.        Mood  and Affect: Mood is depressed. Affect is flat.        Speech: Speech normal.        Behavior: Behavior is withdrawn.        Thought Content: Thought content includes suicidal ideation.        Cognition and Memory: Cognition and memory normal.        Judgment: Judgment normal.     Review of Systems  Constitutional: Positive for fatigue. Negative for appetite change.  HENT: Negative for rhinorrhea and sore throat.   Eyes: Negative for photophobia and visual disturbance.  Respiratory: Negative for cough and shortness of breath.   Cardiovascular: Negative for chest pain and palpitations.  Gastrointestinal: Positive for constipation and rectal pain. Negative for diarrhea, nausea and vomiting.  Endocrine: Negative for cold intolerance and heat intolerance.  Genitourinary: Negative for difficulty urinating and dysuria.  Musculoskeletal: Positive for arthralgias, back pain, gait problem and myalgias.  Skin: Negative for rash and wound.  Allergic/Immunologic: Negative for food allergies and immunocompromised state.  Neurological: Positive for weakness. Negative for dizziness and headaches.  Hematological: Negative for adenopathy. Does not bruise/bleed easily.  Psychiatric/Behavioral: Positive for dysphoric mood, sleep disturbance and suicidal ideas.    Blood pressure (!) 154/98, pulse 69, temperature 98.3 F (36.8 C), temperature source Oral, resp. rate 18, height 5\' 11"  (1.803 m), weight 98 kg, SpO2 99 %.Body mass index is 30.13 kg/m.  General Appearance: Disheveled  Eye Contact:  Fair  Speech:  Clear and Coherent  Volume:  Decreased  Mood:  Depressed and Dysphoric  Affect:  Congruent and Constricted  Thought Process:  Coherent  Orientation:  Full (Time, Place, and Person)  Thought Content:  Logical  Suicidal Thoughts:  Yes.  without intent/plan  Homicidal Thoughts:  No  Memory:  Immediate;   Fair Recent;   Fair  Judgement:  Impaired  Insight:  Shallow  Psychomotor Activity:   Decreased  Concentration:  Concentration: Fair and Attention Span: Fair  Recall:  [employer] of Knowledge:  Fair  Language:  Fair  Akathisia:  Negative  Handed:  Right  AIMS (if indicated):     Assets:  Communication Skills Desire for Improvement Financial Resources/Insurance Housing Intimacy Social Support  ADL's:  Impaired  Cognition:  WNL  Sleep:  Number of Hours: 7.75      COGNITIVE FEATURES THAT CONTRIBUTE TO RISK:  Loss of executive function and Polarized thinking    SUICIDE RISK:   Moderate:  Frequent suicidal ideation with limited intensity, and duration, some specificity in terms of plans, no associated intent, good self-control, limited dysphoria/symptomatology, some risk factors present, and identifiable protective factors, including available and accessible social support.  PLAN OF CARE: 70 year old male with history of MDD and GAD presenting after intentional ingestion of Prozac and Remeron resulting in serotonin syndrome and medical admission. At this time patient continues to endorse depressed mood, poor sleep, low energy, anhedonia, hopelessness, helplessness, worthlessness, and passive suicidal ideations. Will start Buproprion XL 150 mg daily for depression, Ativan PRN for anxiety.   I certify that inpatient services furnished can reasonably be expected to improve  the patient's condition.   [name], MD 12/17/2020, 11:04 AM

## 2020-12-17 NOTE — B[facility] Counselor (Signed)
CSW attempted to contact [name], the patient's spouse, for collateral and in order to complete SPE. Patient's spouse was not reached at this time. CSW left HIPAA compliant voicemail with callback request and contact information. CSW will make additional attempts to reach collateral contact.   Signed:  Durenda Hurt, MSW, [place], LCASA 12/17/2020 11:18 AM

## 2020-12-17 NOTE — H&P (Signed)
Psychiatric Admission Assessment Adult  Patient Identification: Jeremy Reese MRN:  [id number] Date of Evaluation:  12/17/2020 Chief Complaint:  MDD (major depressive disorder), recurrent severe, without psychosis ([place]) [F33.2] Principal Diagnosis: MDD (major depressive disorder), recurrent severe, without psychosis ([place]) Diagnosis:  Principal Problem:   MDD (major depressive disorder), recurrent severe, without psychosis ([place]) Active Problems:   Hyperlipidemia   Chronic atrial fibrillation ([place])   Chronic diastolic congestive heart failure ([place])   Flatback syndrome of thoracolumbar region   Hypertension   Suicide attempt ([place])  History of Present Illness: 70 year old male with history of MDD and GAD presenting after intentional ingestion of Prozac and Remeron resulting in serotonin syndrome and medical admission. At this time patient continues to endorse depressed mood, poor sleep, low energy, anhedonia, hopelessness, helplessness, worthlessness, and passive suicidal ideations. He states that after he got his booster Moderna injection he began to have a skin rash and had a hemrrohoid flare. This made it painful for him to sit or stand, and caused him to be unable to sleep for three nights. He notes at that time he no longer cared if he lived or died, and took a hand full of Prozac and Remeron. While in the ED, he admitted this was a suicide attempt. However, at this time he is minimizing the attempt and stating he was trying to get to sleep. Discussed RBA of Wellbutrin for treatment of depression to avoid serotonergic agents. He is agreeable with this plan of care. Will also order PT consult for generalized weakness and deconditioning.   Associated Signs/Symptoms: Depression Symptoms:  depressed mood, anhedonia, insomnia, psychomotor retardation, feelings of worthlessness/guilt, hopelessness, recurrent thoughts of death, suicidal thoughts with specific plan, suicidal  attempt, Duration of Depression Symptoms: No data recorded (Hypo) Manic Symptoms:  Impulsivity, Anxiety Symptoms:  Excessive Worry, Panic Symptoms, Psychotic Symptoms:  Denies Duration of Psychotic Symptoms: No data recorded PTSD Symptoms: Negative Total Time spent with patient: 1 hour  Past Psychiatric History: Patient has a past history of depression. In 1992 he had a suicide attempt by overdose. Had a hospitalization at that time. Saw Dr. Thurmond Butts for years. Much of the time diagnosed with seasonal affective disorder and took fluoxetine and used a light box in the winter. Between that time and now had not had any other major depressive episodes. No history of mania.  Is the patient at risk to self? Yes.    Has the patient been a risk to self in the past 6 months? Yes.    Has the patient been a risk to self within the distant past? Yes.    Is the patient a risk to others? No.  Has the patient been a risk to others in the past 6 months? No.  Has the patient been a risk to others within the distant past? No.   Prior Inpatient Therapy:   Prior Outpatient Therapy:    Alcohol Screening: 1. How often do you have a drink containing alcohol?: Never 2. How many drinks containing alcohol do you have on a typical day when you are drinking?: 1 or 2 3. How often do you have six or more drinks on one occasion?: Never AUDIT-C Score: 0 4. How often during the last year have you found that you were not able to stop drinking once you had started?: Never 5. How often during the last year have you failed to do what was normally expected from you because of drinking?: Never 6. How often during the last year have  you needed a first drink in the morning to get yourself going after a heavy drinking session?: Never 7. How often during the last year have you had a feeling of guilt of remorse after drinking?: Never 8. How often during the last year have you been unable to remember what happened the night  before because you had been drinking?: Never 9. Have you or someone else been injured as a result of your drinking?: No 10. Has a relative or friend or a doctor or another health worker been concerned about your drinking or suggested you cut down?: No Alcohol Use Disorder Identification Test Final Score (AUDIT): 0 Alcohol Brief Interventions/Follow-up: Alcohol Education,AUDIT Score <7 follow-up not indicated Substance Abuse History in the last 12 months:  No. Consequences of Substance Abuse: Negative Previous Psychotropic Medications: Yes  Psychological Evaluations: Yes  Past Medical History:  Past Medical History:  Diagnosis Date  . Atrial flutter, paroxysmal (HCC)   . Carotid artery disease without cerebral infarction (HCC)   . Coronary artery disease 2011  . Flatback syndrome of thoracolumbar region   . H/O calcium pyrophosphate deposition disease (CPPD)   . History of TIA (transient ischemic attack)   . Hyperlipidemia   . Paroxysmal A-fib (HCC)   . Spinal stenosis of lumbar region without neurogenic claudication     Past Surgical History:  Procedure Laterality Date  . BILATERAL CARPAL TUNNEL RELEASE    . CARDIAC CATHETERIZATION    . CIRCUMCISION, NON-NEWBORN    . CLIPPING OF ATRIAL APPENDAGE N/A 09/18/2020   Procedure: CLIPPING OF ATRIAL APPENDAGE USING 45 ATRICURE LAA  EXCLUSION SYSTEM;  Surgeon: Kerin PernaVan Trigt, Peter, MD;  [facility];  Service: Open Heart Surgery;  Laterality: N/A;  . CORONARY ANGIOPLASTY WITH STENT PLACEMENT  2011  . CORONARY ARTERY BYPASS GRAFT N/A 09/18/2020   Procedure: CORONARY ARTERY BYPASS GRAFTING (CABG)X 3, ON PUMP, USING LEFT INTERAL MAMMARY ARTERY AND ENDOSCOPICALLY HARVESTED RIGHT GREATER SAPHENOUS VEIN. LIMA TO LAD, SVG TO OM, SVG TO PD;  Surgeon: Kerin PernaVan Trigt, Peter, MD;  [facility];  Service: Open Heart Surgery;  Laterality: N/A;  . ENDOVEIN HARVEST OF GREATER SAPHENOUS VEIN Right 09/18/2020   Procedure: ENDOVEIN HARVEST OF GREATER SAPHENOUS  VEIN;  Surgeon: Kerin PernaVan Trigt, Peter, MD;  [facility];  Service: Open Heart Surgery;  Laterality: Right;  . LEFT HEART CATH AND CORONARY ANGIOGRAPHY Left 09/12/2020   Procedure: LEFT HEART CATH AND CORONARY ANGIOGRAPHY;  Surgeon: Antonieta IbaGollan, Timothy J, MD;  Location: [facility] INVASIVE CV LAB;  Service: Cardiovascular;  Laterality: Left;  . TEE WITHOUT CARDIOVERSION N/A 09/18/2020   Procedure: TRANSESOPHAGEAL ECHOCARDIOGRAM (TEE);  Surgeon: Donata ClayVan Trigt, Theron AristaPeter, MD;  [facility];  Service: Open Heart Surgery;  Laterality: N/A;   Family History:  Family History  Problem Relation Age of Onset  . Heart disease Mother 6770       CABG x 3   . Hypertension Father    Family Psychiatric  History: Denies Tobacco Screening: Have you used any form of tobacco in the last 30 days? (Cigarettes, Smokeless Tobacco, Cigars, and/or Pipes): No Are you interested in Tobacco Cessation Medications?: No, patient refused Counseled patient on smoking cessation including recognizing danger situations, developing coping skills and basic information about quitting provided: Refused/Declined practical counseling Social History:  Social History   Substance and Sexual Activity  Alcohol Use Yes   Comment: rare      Social History   Substance and Sexual Activity  Drug Use No    Additional  Social History: Marital status: Married Number of Years Married: 44 What types of issues is patient dealing with in the relationship?: Pt denies. Does patient have children?: Yes How many children?: 2 How is patient's relationship with their children?: "great"                         Allergies:   Allergies  Allergen Reactions  . Propafenone Anxiety  . Codeine Nausea Only  . Penicillins Rash   Lab Results:  Results for orders placed or performed during the hospital encounter of 12/11/20 (from the past 48 hour(s))  Resp Panel by RT-PCR (Flu A&B, Covid) Nasopharyngeal Swab     Status: None   Collection Time: 12/16/20   2:04 PM   Specimen: Nasopharyngeal Swab; Nasopharyngeal(NP) swabs in vial transport medium  Result Value Ref Range   SARS Coronavirus 2 by RT PCR NEGATIVE NEGATIVE    Comment: (NOTE) SARS-CoV-2 target nucleic acids are NOT DETECTED.  The SARS-CoV-2 RNA is generally detectable in upper respiratory specimens during the acute phase of infection. The lowest concentration of SARS-CoV-2 viral copies this assay can detect is 138 copies/mL. A negative result does not preclude SARS-Cov-2 infection and should not be used as the sole basis for treatment or other patient management decisions. A negative result may occur with  improper specimen collection/handling, submission of specimen other than nasopharyngeal swab, presence of viral mutation(s) within the areas targeted by this assay, and inadequate number of viral copies(<138 copies/mL). A negative result must be combined with clinical observations, patient history, and epidemiological information. The expected result is Negative.  Fact Sheet for Patients:  EntrepreneurPulse.com.au  Fact Sheet for Healthcare Providers:  IncredibleEmployment.be  This test is no t yet approved or cleared by the [place] FDA and  has been authorized for detection and/or diagnosis of SARS-CoV-2 by FDA under an Emergency Use Authorization (EUA). This EUA will remain  in effect (meaning this test can be used) for the duration of the COVID-19 declaration under Section 564(b)(1) of the Act, 21 U.S.C.section 360bbb-3(b)(1), unless the authorization is terminated  or revoked sooner.       Influenza A by PCR NEGATIVE NEGATIVE   Influenza B by PCR NEGATIVE NEGATIVE    Comment: (NOTE) The Xpert Xpress SARS-CoV-2/FLU/RSV plus assay is intended as an aid in the diagnosis of influenza from Nasopharyngeal swab specimens and should not be used as a sole basis for treatment. Nasal washings and aspirates are unacceptable for  Xpert Xpress SARS-CoV-2/FLU/RSV testing.  Fact Sheet for Patients: EntrepreneurPulse.com.au  Fact Sheet for Healthcare Providers: IncredibleEmployment.be  This test is not yet approved or cleared by the [place] FDA and has been authorized for detection and/or diagnosis of SARS-CoV-2 by FDA under an Emergency Use Authorization (EUA). This EUA will remain in effect (meaning this test can be used) for the duration of the COVID-19 declaration under Section 564(b)(1) of the Act, 21 U.S.C. section 360bbb-3(b)(1), unless the authorization is terminated or revoked.  Performed at [facility], [address] [place], [place] [address]     Blood Alcohol level:  Lab Results  Component Value Date   ETH 16 (H) 12/11/2020   ETH <10 [id number]    Metabolic Disorder Labs:  Lab Results  Component Value Date   HGBA1C 6.4 (H) 09/16/2020   MPG 137 09/16/2020   No results found for: PROLACTIN Lab Results  Component Value Date   CHOL 108 09/05/2020   TRIG 76 09/05/2020  HDL 44 09/05/2020   CHOLHDL 2.5 09/05/2020   LDLCALC 48 09/05/2020   LDLCALC 95 04/02/2015    Current Medications: Current Facility-Administered Medications  Medication Dose Route Frequency Provider Last Rate Last Admin  . acetaminophen (TYLENOL) tablet 650 mg  650 mg Oral Q6H PRN [name], MD      . alum & mag hydroxide-simeth (MAALOX/MYLANTA) 200-200-20 MG/5ML suspension 30 mL  30 mL Oral Q4H PRN [name], MD      . apixaban [name]) tablet 5 mg  5 mg Oral BID [name], MD   5 mg at 12/17/20 0825  . aspirin EC tablet 81 mg  81 mg Oral Daily [name], MD   81 mg at 12/17/20 0825  . buPROPion (WELLBUTRIN XL) 24 hr tablet 150 mg  150 mg Oral Daily [name], MD      . carvedilol (COREG) tablet 25 mg  25 mg Oral BID WC [name], MD   25 mg at 12/17/20 0825  . ezetimibe (ZETIA) tablet 10 mg  10 mg Oral Daily [name], MD   10 mg at 12/17/20 0825  . feeding supplement (ENSURE ENLIVE / ENSURE PLUS) liquid 237 mL  237 mL Oral BID BM [name], MD      . gabapentin (NEURONTIN) capsule 400 mg  400 mg Oral TID [name], MD   400 mg at 12/17/20 0824  . LORazepam (ATIVAN) tablet 1 mg  1 mg Oral Q6H PRN [name], MD   1 mg at 12/17/20 0426  . magnesium hydroxide (MILK OF MAGNESIA) suspension 30 mL  30 mL Oral Daily PRN [name], MD      . Derrill Memo ON 12/18/2020] multivitamin with minerals tablet 1 tablet  1 tablet Oral Daily [name], MD      . nitroGLYCERIN (NITROSTAT) SL tablet 0.4 mg  0.4 mg Sublingual Q5 min PRN [name], MD      . phenylephrine-shark liver oil-mineral oil-petrolatum (PREPARATION H) rectal ointment 1 application  1 application Rectal BID PRN [name], MD      . pneumococcal 23 valent vaccine (PNEUMOVAX-23) injection 0.5 mL  0.5 mL Intramuscular Tomorrow-1000 [name], MD      . polyethylene glycol (MIRALAX / GLYCOLAX) packet 17 g  17 g Oral Daily [name], MD   17 g at 12/17/20 1033  . rosuvastatin (CRESTOR) tablet 40 mg  40 mg Oral Daily [name], MD   40 mg at 12/17/20 0825  . senna-docusate (Senokot-S) tablet 2 tablet  2 tablet Oral Daily [name], MD   2 tablet at 12/17/20 0825   PTA Medications: Medications Prior to Admission  Medication Sig Dispense Refill Last Dose  . aspirin EC 81 MG tablet Take 1 tablet (81 mg total) by mouth daily. 90 tablet 3   . carvedilol (COREG) 25 MG tablet Take 1 tablet (25 mg total) by mouth 2 (two) times daily with a meal. 60 tablet 3   . ELIQUIS 5 MG TABS tablet TAKE ONE TABLET TWICE DAILY 60 tablet 1   . ezetimibe (ZETIA) 10 MG tablet TAKE ONE (1) TABLET EACH DAY 30 tablet 3   . gabapentin (NEURONTIN) 400 MG capsule Take 400 mg by mouth 3 (three) times daily.      . nitroGLYCERIN (NITROSTAT) 0.4 MG SL tablet Place 1 tablet (0.4 mg total) under the tongue every 5 (five)  minutes as needed for chest pain. 25 tablet 3   . QC STOOL SOFTENER PLS LAXATIVE 8.6-50 MG tablet Take 2 tablets by mouth daily.     . rosuvastatin (CRESTOR) 40 MG tablet Take 1 tablet (40 mg total) by mouth daily. 90 tablet 3     Musculoskeletal: Strength & Muscle Tone: decreased Gait & Station: unsteady Patient leans: Front  Psychiatric Specialty Exam: Physical Exam Vitals and nursing note reviewed.  Constitutional:      Appearance: Normal appearance.  HENT:     Head: Normocephalic and atraumatic.     Right Ear: External ear normal.     Left Ear: External ear normal.     Nose: Nose normal.     Mouth/Throat:     Mouth: Mucous membranes are moist.     Pharynx: Oropharynx is clear.  Eyes:     Extraocular Movements: Extraocular movements intact.     Pupils: Pupils are equal, round, and reactive to light.  Cardiovascular:     Rate and Rhythm: Normal rate.     Pulses: Normal pulses.  Pulmonary:     Effort: Pulmonary effort is normal.  Abdominal:     General: Abdomen is flat.     Palpations: Abdomen is soft.  Musculoskeletal:        General: Deformity present. No swelling.     Cervical back: Normal range of motion and neck supple.  Skin:    General: Skin is warm and dry.  Neurological:     General: No focal deficit present.     Mental Status: He is alert and oriented to person, place, and time.  Psychiatric:        Attention and Perception: Attention and perception normal.        Mood and Affect: Mood is depressed. Affect is flat.        Speech: Speech normal.        Behavior: Behavior is withdrawn.        Thought Content: Thought content includes suicidal ideation.        Cognition and Memory: Cognition and memory normal.        Judgment: Judgment normal.     Review of Systems  Constitutional: Positive for fatigue. Negative for appetite change.  HENT: Negative for rhinorrhea and sore throat.   Eyes: Negative for photophobia and visual disturbance.  Respiratory:  Negative for cough and shortness of breath.   Cardiovascular: Negative for chest pain and palpitations.  Gastrointestinal: Positive for constipation and rectal pain. Negative for diarrhea, nausea and vomiting.  Endocrine: Negative for cold intolerance and heat intolerance.  Genitourinary: Negative for difficulty urinating and dysuria.  Musculoskeletal: Positive for arthralgias, back pain, gait problem and myalgias.  Skin: Negative for rash and wound.  Allergic/Immunologic: Negative for food allergies and immunocompromised state.  Neurological: Positive for weakness. Negative for dizziness and headaches.  Hematological: Negative for adenopathy. Does not bruise/bleed easily.  Psychiatric/Behavioral: Positive for dysphoric mood, sleep disturbance and suicidal ideas.    Blood pressure (!) 154/98, pulse 69, temperature 98.3 F (36.8 C), temperature source Oral, resp. rate 18, height 5\' 11"  (1.803 m), weight 98 kg, SpO2 99 %.Body mass index is 30.13 kg/m.  General Appearance: Disheveled  Eye Contact:  Fair  Speech:  Clear and Coherent  Volume:  Decreased  Mood:  Depressed and Dysphoric  Affect:  Congruent and Constricted  Thought Process:  Coherent  Orientation:  Full (Time, Place, and Person)  Thought Content:  Logical  Suicidal Thoughts:  Yes.  without intent/plan  Homicidal Thoughts:  No  Memory:  Immediate;   Fair Recent;   Fair  Judgement:  Impaired  Insight:  Shallow  Psychomotor Activity:  Decreased  Concentration:  Concentration: Fair and Attention Span: Fair  Recall:  [employer] of Knowledge:  Fair  Language:  Fair  Akathisia:  Negative  Handed:  Right  AIMS (if indicated):     Assets:  Communication Skills Desire for Improvement Financial Resources/Insurance Housing Intimacy Social Support  ADL's:  Impaired  Cognition:  WNL  Sleep:  Number of Hours: 7.75       Treatment Plan Summary: Daily contact with patient to assess and evaluate symptoms and progress in  treatment and Medication management PLAN OF CARE: 70 year old male with history of MDD and GAD presenting after intentional ingestion of Prozac and Remeron resulting in serotonin syndrome and medical admission. At this time patient continues to endorse depressed mood, poor sleep, low energy, anhedonia, hopelessness, helplessness, worthlessness, and passive suicidal ideations. Will start Buproprion XL 150 mg daily for depression, Ativan PRN for anxiety.   Observation Level/Precautions:  15 minute checks  Laboratory:  Completed in ED and medical floor  Psychotherapy:    Medications:    Consultations:    Discharge Concerns:    Estimated LOS:  Other:     Physician Treatment Plan for Primary Diagnosis: MDD (major depressive disorder), recurrent severe, without psychosis ([place]) Long Term Goal(s): Improvement in symptoms so as ready for discharge  Short Term Goals: Ability to identify changes in lifestyle to reduce recurrence of condition will improve, Ability to verbalize feelings will improve, Ability to disclose and discuss suicidal ideas, Ability to demonstrate self-control will improve, Ability to identify and develop effective coping behaviors will improve and Compliance with prescribed medications will improve  Physician Treatment Plan for Secondary Diagnosis: Principal Problem:   MDD (major depressive disorder), recurrent severe, without psychosis ([place]) Active Problems:   Hyperlipidemia   Chronic atrial fibrillation ([place])   Chronic diastolic congestive heart failure (HCC)   Flatback syndrome of thoracolumbar region   Hypertension   Suicide attempt ([place])  Long Term Goal(s): Improvement in symptoms so as ready for discharge  Short Term Goals: Ability to identify changes in lifestyle to reduce recurrence of condition will improve, Ability to verbalize feelings will improve, Ability to disclose and discuss suicidal ideas, Ability to demonstrate self-control will improve, Ability to identify  and develop effective coping behaviors will improve and Compliance with prescribed medications will improve  I certify that inpatient services furnished can reasonably be expected to improve the patient's condition.    [name], MD 1/19/202211:15 AM

## 2020-12-17 NOTE — BHH Counselor (Signed)
Adult Comprehensive Assessment  Patient ID: Jeremy Reese, male   DOB: Jul 22, 1951, 70 y.o.   MRN: [id number]  Information Source: Information source: Patient  Current Stressors:  Patient states their primary concerns and needs for treatment are:: Depressed. . . unable to sleep from [place] booster . . . terrible thing to do . . . tupid for doing (related to attempted to overdosed)" Patient states their goals for this hospitilization and ongoing recovery are:: "Have to think about that" Educational / Learning stressors: Pt denies. Employment / Job issues: Pt denies. Family Relationships: Pt denies. Financial / Lack of resources (include bankruptcy): "stressed about bills" Housing / Lack of housing: Pt denies. Physical health (include injuries & life threatening diseases): "back . . .  insomnia" Social relationships: Pt denies. Substance abuse: Pt denies. Bereavement / Loss: Pt denies.  Living/Environment/Situation:  Living Arrangements: Spouse/significant other Living conditions (as described by patient or guardian): "pretty good" Who else lives in the home?: "wife" How long has patient lived in current situation?: "we've been in the home since 1987" What is atmosphere in current home: Supportive,Comfortable  Family History:  Marital status: Married Number of Years Married: 44 What types of issues is patient dealing with in the relationship?: Pt denies. Does patient have children?: Yes How many children?: 2 How is patient's relationship with their children?: "great"  Childhood History:  By whom was/is the patient raised?: Both parents Description of patient's relationship with caregiver when they were a child: "fantastc" Patient's description of current relationship with people who raised him/her: Pt reports that his parents are deceased. Does patient have siblings?: Yes Number of Siblings: 2 Description of patient's current relationship with siblings: "pretty good" Did  patient suffer any verbal/emotional/physical/sexual abuse as a child?: No Did patient suffer from severe childhood neglect?: No Has patient ever been sexually abused/assaulted/raped as an adolescent or adult?: No Was the patient ever a victim of a crime or a disaster?: No Witnessed domestic violence?: No Has patient been affected by domestic violence as an adult?: No  Education:  Highest grade of school patient has completed: "BS from [employer]" Learning disability?: No  Employment/Work Situation:   Employment situation: Retired Archivist job has been impacted by current illness: No What is the longest time patient has a held a job?: "27 years" 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" Has patient ever been in the [employer]?: No  Financial Resources:   Museum/gallery curator resources: Orthoptist ([employer]) Does patient have a Programmer, applications or guardian?: No  Alcohol/Substance Abuse:   What has been your use of drugs/alcohol within the last 12 months?: Patient denies If attempted suicide, did drugs/alcohol play a role in this?: Yes (Patient reports injesting 10x prozac and 10x Rimeral) Alcohol/Substance Abuse Treatment Hx: Denies past history Has alcohol/substance abuse ever caused legal problems?: No  Social Support System:   Pensions consultant Support System: Good Describe Community Support System: Patient reports having a good support system, though resistant to seeking help Type of faith/religion: "Christian" How does patient's faith help to cope with current illness?: "I hope so, pray a lot"  Leisure/Recreation:   Do You Have Hobbies?: Yes Leisure and Hobbies: "used to do woodworking and fishing"  Strengths/Needs:   What is the patient's perception of their strengths?: "easy to get along with" Patient states they can use these personal strengths during their treatment to contribute to their recovery: Pt denies. Patient states  these barriers may affect/interfere with their treatment: none  reported Patient states these barriers may affect their return to the community: none reported Other important information patient would like considered in planning for their treatment: none reported  Discharge Plan:   Currently receiving community mental health services: No Patient states concerns and preferences for aftercare planning are: Patient was agreeable to recieving outpatient therapy Patient states they will know when they are safe and ready for discharge when: "when things do not feel so overwhelming." Does patient have access to transportation?: Yes Does patient have financial barriers related to discharge medications?: No Will patient be returning to same living situation after discharge?: Yes  Summary/Recommendations:   Summary and Recommendations (to be completed by the evaluator): Patient is a 70 year old male, married for 36 years from [place], [place]   He reports that he receives SSI among other retirement assets.  He presents to the hospital following attempted suicide; he reports ingesting roughly 10x Prozac and 10x Remeron. He has a primary diagnosis of MDD, severe, recurrent, w/out psychotic features.  Recommendations include: crisis stabilization, therapeutic milieu, encourage group attendance and participation, medication management for detox/mood stabilization and development of comprehensive mental wellness/sobriety plan.            Patient is orriented x4, patient was a good historian during assessment. Patient presented with pleasant mood, albeit slightly depressed. Patient was last admitted to the unit on 12 November 2020 with SI and depressed mood. Patient provided consent for CSW team to contact Judie Bonus, spouse, at [phone]. Patient signed consent for CSW team to coordinate aftercare at this time.  Durenda Hurt. 12/17/2020

## 2020-12-17 NOTE — Evaluation (Signed)
Physical Therapy Evaluation Patient Details Name: Jeremy Reese MRN: [id number] DOB: [birth year] Today's Date: 12/17/2020   History of Present Illness  70 yo male with onset of severe depression and taking meds in large quantity was admitted to [facility], after not having slept in several days.  Pt has been overwhelmed since dc from prev psych admission, with suicidal thoughts and low energy.  Referred to PT to assess mobility.  PMHx: CHF, HLD, a-fib, CABG, CAD  Clinical Impression  Pt was seen for mobility on RW with good tolerance and correction of posture with longer walk.  His follow through on instructions for mobility and use of RW was good, and has demonstrated a better postural control with exercise and cued sit to stand, supine to sit.  Follow along with HEP set up and will need to try stairs if convenient to demonstrate how he will need to be assisted at home.  Has had family from out of town but will not at the time of DC.  Observe body mechanics, provide education as needed.    Follow Up Recommendations Home health PT;Supervision for mobility/OOB    Equipment Recommendations  Rolling walker with 5" wheels    Recommendations for Other Services       Precautions / Restrictions Precautions Precautions: Fall Precaution Comments: back pain chronically Restrictions Weight Bearing Restrictions: No      Mobility  Bed Mobility Overal bed mobility: Needs Assistance Bed Mobility: Supine to Sit;Sit to Supine     Supine to sit: Min assist;Supervision Sit to supine: Supervision   General bed mobility comments: initially needed minor help to sit up but then demonstrated supervision level bed mob    Transfers Overall transfer level: Needs assistance Equipment used: Rolling walker (2 wheeled);1 person hand held assist Transfers: Sit to/from Stand Sit to Stand: Supervision         General transfer comment: cued body mechanics  Ambulation/Gait Ambulation/Gait assistance: Min  guard Gait Distance (Feet): 100 Feet Assistive device: Rolling walker (2 wheeled);1 person hand held assist Gait Pattern/deviations: Step-through pattern;Decreased stride length;Wide base of support Gait velocity: reduced Gait velocity interpretation: <1.31 ft/sec, indicative of household ambulator General Gait Details: laps in confined space with good response to stand up with better posture as his distance increased  Stairs            Wheelchair Mobility    Modified Rankin (Stroke Patients Only)       Balance Overall balance assessment: Needs assistance Sitting-balance support: Feet supported Sitting balance-Leahy Scale: Good     Standing balance support: Bilateral upper extremity supported;During functional activity Standing balance-Leahy Scale: Fair                               Pertinent Vitals/Pain Pain Assessment: Faces Faces Pain Scale: Hurts little more Pain Location: back Pain Descriptors / Indicators: Grimacing;Guarding Pain Intervention(s): Monitored during session;Repositioned    Home Living Family/patient expects to be discharged to:: Private residence Living Arrangements: Spouse/significant other Available Help at Discharge: Family;Available PRN/intermittently Type of Home: House Home Access: Stairs to enter Entrance Stairs-Rails: None Entrance Stairs-Number of Steps: 3 Home Layout: Two level;Able to live on main level with bedroom/bathroom Home Equipment: Walker - 4 wheels Additional Comments: has been independent of gait with no AD previously    Prior Function Level of Independence: Independent with assistive device(s);Independent         Comments: has Rollator     Hand  Dominance        Extremity/Trunk Assessment   Upper Extremity Assessment Upper Extremity Assessment: Overall WFL for tasks assessed    Lower Extremity Assessment Lower Extremity Assessment: Overall WFL for tasks assessed (x hip ext and adduction)     Cervical / Trunk Assessment Cervical / Trunk Assessment: Other exceptions (chronic back pain)  Communication   Communication: No difficulties  Cognition Arousal/Alertness: Awake/alert Behavior During Therapy: Flat affect Overall Cognitive Status: Within Functional Limits for tasks assessed                                 General Comments: following along with instructions slowly      General Comments General comments (skin integrity, edema, etc.): pt is up to walk with min guard and fair static balance, but with the walker is fair dynamically    Exercises General Exercises - Lower Extremity Ankle Circles/Pumps: AROM;5 reps Quad Sets: AROM;10 reps Gluteal Sets: AROM;10 reps   Assessment/Plan    PT Assessment Patient needs continued PT services  PT Problem List Decreased strength;Decreased balance;Decreased mobility;Decreased safety awareness       PT Treatment Interventions DME instruction;Gait training;Stair training;Functional mobility training;Therapeutic activities;Therapeutic exercise;Balance training;Neuromuscular re-education;Patient/family education    PT Goals (Current goals can be found in the Care Plan section)  Acute Rehab PT Goals Patient Stated Goal: to go home and move with less pain PT Goal Formulation: With patient Time For Goal Achievement: 12/31/20 Potential to Achieve Goals: Good    Frequency Min 2X/week   Barriers to discharge Decreased caregiver support encourage pt to have HHPT for follow up    Co-evaluation               AM-PAC PT "6 Clicks" Mobility  Outcome Measure Help needed turning from your back to your side while in a flat bed without using bedrails?: None Help needed moving from lying on your back to sitting on the side of a flat bed without using bedrails?: A Little Help needed moving to and from a bed to a chair (including a wheelchair)?: A Little Help needed standing up from a chair using your arms (e.g.,  wheelchair or bedside chair)?: A Little Help needed to walk in hospital room?: A Little Help needed climbing 3-5 steps with a railing? : A Little 6 Click Score: 19    End of Session   Activity Tolerance: No increased pain;Patient tolerated treatment well;Patient limited by fatigue Patient left: in bed;with call bell/phone within reach Nurse Communication: Mobility status;Other (comment) (talked about encouraging to to sit OOB) PT Visit Diagnosis: Unsteadiness on feet (R26.81);Muscle weakness (generalized) (M62.81)    Time: 1601-0932 PT Time Calculation (min) (ACUTE ONLY): 24 min   Charges:   PT Evaluation $PT Eval Moderate Complexity: 1 Mod PT Treatments $Gait Training: 8-22 mins       Ramond Dial 12/17/2020, 4:46 PM  Mee Hives, PT MS Acute Rehab Dept. Number: [place] [facility] [place]

## 2020-12-17 NOTE — BHH Group Notes (Signed)
[facility] Group Notes:  (Nursing/MHT/Case Management/Adjunct)  Date:  12/17/2020  Time:  9:03 PM  Type of Therapy:  Group Therapy  Participation Level:  Did Not Attend   Jeremy Reese 12/17/2020, 9:03 PM

## 2020-12-17 NOTE — Progress Notes (Signed)
   12/17/20 1115  Clinical Encounter Type  Visited With Patient (I attempted to visit but pt was asleep)  Visit Type Initial  Referral From Nurse  Consult/Referral To [facility] (For Healthcare)  Does Patient Have a Medical Advance Directive? No  Would patient like information on creating a medical advance directive? No - Patient declined   I went to visit Jeremy Reese during routine rounds. He was asleep. I will attempt another visit later.   [place], [place]

## 2020-12-17 NOTE — Progress Notes (Signed)
NUTRITION ASSESSMENT  Pt identified as at risk on the Malnutrition Screen Tool  INTERVENTION: Continue Ensure Enlive po BID, each supplement provides 350 kcal and 20 grams of protein. Patient prefers chocolate flavor.  Provide MVI po daily.  NUTRITION DIAGNOSIS: Unintentional weight loss related to sub-optimal intake as evidenced by pt report.   Goal: Pt to meet >/= 90% of their estimated nutrition needs.  Monitor:  PO intake  Assessment:  70 y.o. 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   Patient currently admitted in behavioral medicine. While he was inpatient awaiting discharge to behavioral medicine his intake was 25-100% with the average being 81% meal completion of what was recorded. He was drinking Ensure and tolerating well.   Per review of weight history patient has been losing weight since CABG in 08/2020. He was 116.1 kg on 09/12/2020 and is now documented to be 98 kg (216 lbs). He has lost 18.1 kg (15.6% body weight) over the past 3 months, which is significant for time frame.  This puts patient at risk for malnutrition. Patient would benefit from ongoing supplementation with Ensure Enlive, even as PO intake improves to help meet nutrient needs, which will be difficult to meet from meals alone.  Height: Ht Readings from Last 1 Encounters:  12/16/20 5\' 11"  (1.803 m)   Weight: Wt Readings from Last 1 Encounters:  12/16/20 98 kg   Weight Hx: Wt Readings from Last 10 Encounters:  12/16/20 98 kg  12/11/20 103.9 kg  12/08/20 103.9 kg  12/03/20 104.8 kg  11/11/20 104.3 kg  11/10/20 104.3 kg  10/29/20 106.6 kg  10/06/20 111.1 kg  09/23/20 115.4 kg  09/12/20 116.1 kg   BMI:  Body mass index is 30.13 kg/m. Pt meets criteria for obesity class I based on current BMI.  Estimated Nutritional Needs: Kcal: 25-30 kcal/kg Protein: > 1 gram protein/kg Fluid: 1  ml/kcal  Diet Order:  Diet Order            Diet Heart Room service appropriate? Yes; Fluid consistency: Thin  Diet effective now                Pt is also offered choice of unit snacks mid-morning and mid-afternoon.   Lab results and medications reviewed.   Jacklynn Barnacle, MS, RD, LDN Pager number available on Amion

## 2020-12-17 NOTE — BHH Group Notes (Signed)
LCSW Group Therapy Note  12/17/2020 1:56 PM  Type of Therapy/Topic:  Group Therapy:  Emotion Regulation  Participation Level:  Did Not Attend   Description of Group:   The purpose of this group is to assist patients in learning to regulate negative emotions and experience positive emotions. Patients will be guided to discuss ways in which they have been vulnerable to their negative emotions. These vulnerabilities will be juxtaposed with experiences of positive emotions or situations, and patients will be challenged to use positive emotions to combat negative ones. Special emphasis will be placed on coping with negative emotions in conflict situations, and patients will process healthy conflict resolution skills.  Therapeutic Goals: 1. Patient will identify two positive emotions or experiences to reflect on in order to balance out negative emotions 2. Patient will label two or more emotions that they find the most difficult to experience 3. Patient will demonstrate positive conflict resolution skills through discussion and/or role plays  Summary of Patient Progress: Patient did not attend group despite encouraged participation.    Therapeutic Modalities:   Cognitive Behavioral Therapy Feelings Identification Dialectical Behavioral Therapy  Paulla Dolly, MSW, [place], [place] 12/17/2020 1:56 PM

## 2020-12-17 NOTE — Tx Team (Addendum)
Interdisciplinary Treatment and Diagnostic Plan Update  12/17/2020 Time of Session: 9:00AM Jeremy Reese MRN: [id number]  Principal Diagnosis: <principal problem not specified>  Secondary Diagnoses: Active Problems:   MDD (major depressive disorder), recurrent severe, without psychosis ([place])   Current Medications:  Current Facility-Administered Medications  Medication Dose Route Frequency Provider Last Rate Last Admin  . acetaminophen (TYLENOL) tablet 650 mg  650 mg Oral Q6H PRN [name], MD      . alum & mag hydroxide-simeth (MAALOX/MYLANTA) 200-200-20 MG/5ML suspension 30 mL  30 mL Oral Q4H PRN [name], MD      . apixaban [name]) tablet 5 mg  5 mg Oral BID [name], MD   5 mg at 12/17/20 0825  . aspirin EC tablet 81 mg  81 mg Oral Daily [name], MD   81 mg at 12/17/20 0825  . carvedilol (COREG) tablet 25 mg  25 mg Oral BID WC [name], MD   25 mg at 12/17/20 0825  . ezetimibe (ZETIA) tablet 10 mg  10 mg Oral Daily [name], MD   10 mg at 12/17/20 0825  . feeding supplement (ENSURE ENLIVE / ENSURE PLUS) liquid 237 mL  237 mL Oral BID BM [name], MD      . gabapentin (NEURONTIN) capsule 400 mg  400 mg Oral TID [name], MD   400 mg at 12/17/20 0824  . LORazepam (ATIVAN) tablet 1 mg  1 mg Oral Q6H PRN [name], MD   1 mg at 12/17/20 0426  . magnesium hydroxide (MILK OF MAGNESIA) suspension 30 mL  30 mL Oral Daily PRN [name], MD      . nitroGLYCERIN (NITROSTAT) SL tablet 0.4 mg  0.4 mg Sublingual Q5 min PRN [name], MD      . pneumococcal 23 valent vaccine (PNEUMOVAX-23) injection 0.5 mL  0.5 mL Intramuscular Tomorrow-1000 [name], MD      . rosuvastatin (CRESTOR) tablet 40 mg  40 mg Oral Daily [name], MD   40 mg at 12/17/20 0825  . senna-docusate (Senokot-S) tablet 2 tablet  2 tablet Oral Daily [name], MD   2 tablet at 12/17/20 0825   PTA  Medications: Medications Prior to Admission  Medication Sig Dispense Refill Last Dose  . aspirin EC 81 MG tablet Take 1 tablet (81 mg total) by mouth daily. 90 tablet 3   . carvedilol (COREG) 25 MG tablet Take 1 tablet (25 mg total) by mouth 2 (two) times daily with a meal. 60 tablet 3   . ELIQUIS 5 MG TABS tablet TAKE ONE TABLET TWICE DAILY 60 tablet 1   . ezetimibe (ZETIA) 10 MG tablet TAKE ONE (1) TABLET EACH DAY 30 tablet 3   . gabapentin (NEURONTIN) 400 MG capsule Take 400 mg by mouth 3 (three) times daily.      . nitroGLYCERIN (NITROSTAT) 0.4 MG SL tablet Place 1 tablet (0.4 mg total) under the tongue every 5 (five) minutes as needed for chest pain. 25 tablet 3   . QC STOOL SOFTENER PLS LAXATIVE 8.6-50 MG tablet Take 2 tablets by mouth daily.     . rosuvastatin (CRESTOR) 40 MG tablet Take 1 tablet (40 mg total) by mouth daily. 90 tablet 3     Patient Stressors: Financial difficulties Health problems  Patient Strengths: Motivation for treatment/growth Supportive family/friends  Treatment Modalities: Medication Management, Group therapy, Case management,  1 to  1 session with clinician, Psychoeducation, Recreational therapy.   Physician Treatment Plan for Primary Diagnosis: <principal problem not specified> Long Term Goal(s):     Short Term Goals:    Medication Management: Evaluate patient's response, side effects, and tolerance of medication regimen.  Therapeutic Interventions: 1 to 1 sessions, Unit Group sessions and Medication administration.  Evaluation of Outcomes: Not Met  Physician Treatment Plan for Secondary Diagnosis: Active Problems:   MDD (major depressive disorder), recurrent severe, without psychosis ([place])  Long Term Goal(s):     Short Term Goals:       Medication Management: Evaluate patient's response, side effects, and tolerance of medication regimen.  Therapeutic Interventions: 1 to 1 sessions, Unit Group sessions and Medication  administration.  Evaluation of Outcomes: Not Met   RN Treatment Plan for Primary Diagnosis: <principal problem not specified> Long Term Goal(s): Knowledge of disease and therapeutic regimen to maintain health will improve  Short Term Goals: Ability to demonstrate self-control, Ability to participate in decision making will improve, Ability to verbalize feelings will improve, Ability to disclose and discuss suicidal ideas, Ability to identify and develop effective coping behaviors will improve and Compliance with prescribed medications will improve  Medication Management: RN will administer medications as ordered by provider, will assess and evaluate patient's response and provide education to patient for prescribed medication. RN will report any adverse and/or side effects to prescribing provider.  Therapeutic Interventions: 1 on 1 counseling sessions, Psychoeducation, Medication administration, Evaluate responses to treatment, Monitor vital signs and CBGs as ordered, Perform/monitor CIWA, COWS, AIMS and Fall Risk screenings as ordered, Perform wound care treatments as ordered.  Evaluation of Outcomes: Not Met   LCSW Treatment Plan for Primary Diagnosis: <principal problem not specified> Long Term Goal(s): Safe transition to appropriate next level of care at discharge, Engage patient in therapeutic group addressing interpersonal concerns.  Short Term Goals: Engage patient in aftercare planning with referrals and resources, Increase social support, Increase ability to appropriately verbalize feelings, Increase emotional regulation, Facilitate acceptance of mental health diagnosis and concerns and Increase skills for wellness and recovery  Therapeutic Interventions: Assess for all discharge needs, 1 to 1 time with Social worker, Explore available resources and support systems, Assess for adequacy in community support network, Educate family and significant other(s) on suicide prevention, Complete  Psychosocial Assessment, Interpersonal group therapy.  Evaluation of Outcomes: Not Met   Progress in Treatment: Attending groups: No. Participating in groups: No. Taking medication as prescribed: Yes. Toleration medication: Yes. Family/Significant other contact made: No, will contact:  once permission is given. Patient understands diagnosis: Yes. Discussing patient identified problems/goals with staff: No. Medical problems stabilized or resolved: Yes. Denies suicidal/homicidal ideation: Yes. Issues/concerns per patient self-inventory: No. Other: none  New problem(s) identified: No, Describe:  none  New Short Term/Long Term Goal(s):  medication management for mood stabilization; elimination of SI thoughts; development of comprehensive mental wellness/sobriety plan.  Patient Goals:  Patient given the opportunity to attend treatment team, however declined.   Discharge Plan or Barriers: CSW will assist the patient in developing an appropriate discharge plan.    Reason for Continuation of Hospitalization: Anxiety Depression Medical Issues Medication stabilization Suicidal ideation  Estimated Length of Stay:  1-7 days  Recreational Therapy: Patient Stressors: N/A Patient Goal: Patient will engage in groups without prompting or encouragement from LRT x3 group sessions within 5 recreation therapy group sessions.  Attendees: Patient:  12/17/2020 9:40 AM  Physician: Dr. Domingo Cocking, MD  12/17/2020 9:40 AM  Nursing: Graceann Congress, RN  12/17/2020 9:40 AM  RN Care Manager: 12/17/2020 9:40 AM  Social Worker: Assunta Curtis, MSW, LCSW 12/17/2020 9:40 AM  Recreational Therapist: Devin Going, LRT 12/17/2020 9:41 AM  Other: Paulla Dolly, MSW, [place], Narcissa 12/17/2020 9:41 AM  Other:  12/17/2020 9:41 AM  Other: 12/17/2020 9:41 AM    Scribe for Treatment Team: Rozann Lesches, LCSW 12/17/2020 9:41 AM

## 2020-12-17 NOTE — [facility] Suicide Risk Assessment (Signed)
[place] INPATIENT:  Family/Significant Other Suicide Prevention Education  Suicide Prevention Education:  Contact Attempts: [name], (Spouse) has been identified by the patient as the family member/significant other with whom the patient will be residing, and identified as the person(s) who will aid the patient in the event of a mental health crisis.  With written consent from the patient, two attempts were made to provide suicide prevention education, prior to and/or following the patient's discharge.  We were unsuccessful in providing suicide prevention education.  A suicide education pamphlet was given to the patient to share with family/significant other.  Date and time of first attempt: 11:25 AM 17 Dec 2020 Date and time of second attempt:CSW to make additional attempts to reach family member.  Durenda Hurt 12/17/2020, 11:24 AM

## 2020-12-17 NOTE — Progress Notes (Signed)
Recreation Therapy Notes   Date: 12/17/2020  Time: 9:30 am   Location: Craft room     Behavioral response: N/A   Intervention Topic: Time Management   Discussion/Intervention: Patient did not attend group.   Clinical Observations/Feedback:  Patient did not attend group.   Keilee Denman LRT/CTRS        Lizmary Nader 12/17/2020 10:32 AM

## 2020-12-18 NOTE — Progress Notes (Signed)
[facility] MD Progress Note  12/18/2020 1:08 PM Jeremy Reese  MRN:  [id number]   Subjective:  70 year old male with history of MDD and GAD presenting after intentional ingestion of Prozac and Remeron resulting in serotonin syndrome and medical admission. Patient had no acute events overnight, medication compliant, attending to ADLs.  Patient seen one-on-one at bedside today. He continues to minimize the seriousness of his suicide attempt, and continues to state he was trying to sleep better. He notes adequate sleep here on the unit. He feels his anxiety and depression are somewhat better than they were prior to admission. No side effects noted to Wellbutrin started yesterday. He has isolated to his room much of the day, and has been requesting items be brought to his room. Have encouraged patient to ambulate to day room for meals, and attend groups in order to increase strength and endurance. He is also agreeable to home health PT services recommended after his evaluation by physical therapy yesterday.   Principal Problem: MDD (major depressive disorder), recurrent severe, without psychosis ([place]) Diagnosis: Principal Problem:   MDD (major depressive disorder), recurrent severe, without psychosis ([place]) Active Problems:   Hyperlipidemia   Chronic atrial fibrillation (HCC)   Chronic diastolic congestive heart failure (HCC)   Flatback syndrome of thoracolumbar region   Hypertension   Suicide attempt ([place])  Total Time spent with patient: 30 minutes  Past Psychiatric History: Patient has a past history of depression. In 1992 he had a suicide attempt by overdose. Had a hospitalization at that time. Saw Dr. Thurmond Butts for years. Much of the time diagnosed with seasonal affective disorder and took fluoxetine and used a light box in the winter. Between that time and now had not had any other major depressive episodes. No history of mania.  Past Medical History:  Past Medical History:  Diagnosis Date  .  Atrial flutter, paroxysmal ([place])   . Carotid artery disease without cerebral infarction ([place])   . Coronary artery disease 2011  . Flatback syndrome of thoracolumbar region   . H/O calcium pyrophosphate deposition disease (CPPD)   . History of TIA (transient ischemic attack)   . Hyperlipidemia   . Paroxysmal A-fib ([place])   . Spinal stenosis of lumbar region without neurogenic claudication     Past Surgical History:  Procedure Laterality Date  . BILATERAL CARPAL TUNNEL RELEASE    . CARDIAC CATHETERIZATION    . CIRCUMCISION, NON-NEWBORN    . CLIPPING OF ATRIAL APPENDAGE N/A 09/18/2020   Procedure: CLIPPING OF ATRIAL APPENDAGE USING 45 ATRICURE LAA  EXCLUSION SYSTEM;  Surgeon: Ivin Poot, MD;  Location: [place];  Service: Open Heart Surgery;  Laterality: N/A;  . CORONARY ANGIOPLASTY WITH STENT PLACEMENT  2011  . CORONARY ARTERY BYPASS GRAFT N/A 09/18/2020   Procedure: CORONARY ARTERY BYPASS GRAFTING (CABG)X 3, ON PUMP, USING LEFT INTERAL MAMMARY ARTERY AND ENDOSCOPICALLY HARVESTED RIGHT GREATER SAPHENOUS VEIN. LIMA TO LAD, SVG TO OM, SVG TO PD;  Surgeon: Ivin Poot, MD;  Location: [place];  Service: Open Heart Surgery;  Laterality: N/A;  . ENDOVEIN HARVEST OF GREATER SAPHENOUS VEIN Right 09/18/2020   Procedure: ENDOVEIN HARVEST OF GREATER SAPHENOUS VEIN;  Surgeon: Ivin Poot, MD;  Location: [place];  Service: Open Heart Surgery;  Laterality: Right;  . LEFT HEART CATH AND CORONARY ANGIOGRAPHY Left 09/12/2020   Procedure: LEFT HEART CATH AND CORONARY ANGIOGRAPHY;  Surgeon: Minna Merritts, MD;  Location: [place] [facility];  Service: Cardiovascular;  Laterality: Left;  .  TEE WITHOUT CARDIOVERSION N/A 09/18/2020   Procedure: TRANSESOPHAGEAL ECHOCARDIOGRAM (TEE);  Surgeon: Prescott Gum, Collier Salina, MD;  Location: [place];  Service: Open Heart Surgery;  Laterality: N/A;   Family History:  Family History  Problem Relation Age of Onset  . Heart disease Mother 18       CABG x 3   .  Hypertension Father    Family Psychiatric  History: Denies Social History:  Social History   Substance and Sexual Activity  Alcohol Use Yes   Comment: rare      Social History   Substance and Sexual Activity  Drug Use No    Social History   Socioeconomic History  . Marital status: Married    Spouse name: Not on file  . Number of children: Not on file  . Years of education: Not on file  . Highest education level: Not on file  Occupational History  . Not on file  Tobacco Use  . Smoking status: Former Smoker    Packs/day: 0.25    Years: 35.00    Pack years: 8.75    Types: Cigarettes    Quit date: 11/29/1993    Years since quitting: 27.0  . Smokeless tobacco: Never Used  Vaping Use  . Vaping Use: Never used  Substance and Sexual Activity  . Alcohol use: Yes    Comment: rare   . Drug use: No  . Sexual activity: Not on file  Other Topics Concern  . Not on file  Social History Narrative  . Not on file   Social Determinants of Health   Financial Resource Strain: Not on file  Food Insecurity: Not on file  Transportation Needs: Not on file  Physical Activity: Not on file  Stress: Not on file  Social Connections: Not on file   Additional Social History:                         Sleep: Fair  Appetite:  Fair  Current Medications: Current Facility-Administered Medications  Medication Dose Route Frequency Provider Last Rate Last Admin  . acetaminophen (TYLENOL) tablet 650 mg  650 mg Oral Q6H PRN [name], MD      . alum & mag hydroxide-simeth (MAALOX/MYLANTA) 200-200-20 MG/5ML suspension 30 mL  30 mL Oral Q4H PRN [name], MD      . apixaban [name]) tablet 5 mg  5 mg Oral BID [name], MD   5 mg at 12/18/20 [id number]  . aspirin EC tablet 81 mg  81 mg Oral Daily [name], MD   81 mg at 12/18/20 [id number]  . buPROPion (WELLBUTRIN XL) 24 hr tablet 150 mg  150 mg Oral Daily [name], MD   150 mg at 12/18/20 [id number]  . carvedilol  (COREG) tablet 25 mg  25 mg Oral BID WC [name], MD   25 mg at 12/17/20 1629  . ezetimibe (ZETIA) tablet 10 mg  10 mg Oral Daily [name], MD   10 mg at 12/18/20 [id number]  . feeding supplement (ENSURE ENLIVE / ENSURE PLUS) liquid 237 mL  237 mL Oral BID BM [name], MD   237 mL at 12/18/20 1045  . gabapentin (NEURONTIN) capsule 400 mg  400 mg Oral TID [name], MD   400 mg at 12/18/20 1217  . LORazepam (ATIVAN) tablet 1 mg  1 mg Oral Q6H PRN [name], MD   1 mg at  12/17/20 2124  . magnesium hydroxide (MILK OF MAGNESIA) suspension 30 mL  30 mL Oral Daily PRN [name], MD      . multivitamin with minerals tablet 1 tablet  1 tablet Oral Daily [name], MD   1 tablet at 12/18/20 [phone]  . nitroGLYCERIN (NITROSTAT) SL tablet 0.4 mg  0.4 mg Sublingual Q5 min PRN [name], MD      . phenylephrine-shark liver oil-mineral oil-petrolatum (PREPARATION H) rectal ointment 1 application  1 application Rectal BID PRN [name], MD      . polyethylene glycol (MIRALAX / GLYCOLAX) packet 17 g  17 g Oral Daily [name], MD   17 g at 12/18/20 [id number]  . rosuvastatin (CRESTOR) tablet 40 mg  40 mg Oral Daily [name], MD   40 mg at 12/18/20 0820  . senna-docusate (Senokot-S) tablet 2 tablet  2 tablet Oral Daily [name], MD   2 tablet at 12/18/20 0820    Lab Results:  Results for orders placed or performed during the hospital encounter of 12/11/20 (from the past 48 hour(s))  Resp Panel by RT-PCR (Flu A&B, Covid) Nasopharyngeal Swab     Status: None   Collection Time: 12/16/20  2:04 PM   Specimen: Nasopharyngeal Swab; Nasopharyngeal(NP) swabs in vial transport medium  Result Value Ref Range   SARS Coronavirus 2 by RT PCR NEGATIVE NEGATIVE    Comment: (NOTE) SARS-CoV-2 target nucleic acids are NOT DETECTED.  The SARS-CoV-2 RNA is generally detectable in upper respiratory specimens during the acute phase of infection. The  lowest concentration of SARS-CoV-2 viral copies this assay can detect is 138 copies/mL. A negative result does not preclude SARS-Cov-2 infection and should not be used as the sole basis for treatment or other patient management decisions. A negative result may occur with  improper specimen collection/handling, submission of specimen other than nasopharyngeal swab, presence of viral mutation(s) within the areas targeted by this assay, and inadequate number of viral copies(<138 copies/mL). A negative result must be combined with clinical observations, patient history, and epidemiological information. The expected result is Negative.  Fact Sheet for Patients:  EntrepreneurPulse.com.au  Fact Sheet for Healthcare Providers:  IncredibleEmployment.be  This test is no t yet approved or cleared by the [place] FDA and  has been authorized for detection and/or diagnosis of SARS-CoV-2 by FDA under an Emergency Use Authorization (EUA). This EUA will remain  in effect (meaning this test can be used) for the duration of the COVID-19 declaration under Section 564(b)(1) of the Act, 21 U.S.C.section 360bbb-3(b)(1), unless the authorization is terminated  or revoked sooner.       Influenza A by PCR NEGATIVE NEGATIVE   Influenza B by PCR NEGATIVE NEGATIVE    Comment: (NOTE) The Xpert Xpress SARS-CoV-2/FLU/RSV plus assay is intended as an aid in the diagnosis of influenza from Nasopharyngeal swab specimens and should not be used as a sole basis for treatment. Nasal washings and aspirates are unacceptable for Xpert Xpress SARS-CoV-2/FLU/RSV testing.  Fact Sheet for Patients: EntrepreneurPulse.com.au  Fact Sheet for Healthcare Providers: IncredibleEmployment.be  This test is not yet approved or cleared by the [place] FDA and has been authorized for detection and/or diagnosis of SARS-CoV-2 by FDA under an Emergency  Use Authorization (EUA). This EUA will remain in effect (meaning this test can be used) for the duration of the COVID-19 declaration under Section 564(b)(1) of the Act, 21 U.S.C. section 360bbb-3(b)(1), unless the authorization is terminated  or revoked.  Performed at [facility], [address] [place], [place] [address]     Blood Alcohol level:  Lab Results  Component Value Date   ETH 16 (H) 12/11/2020   ETH <10 [id number]    Metabolic Disorder Labs: Lab Results  Component Value Date   HGBA1C 6.4 (H) 09/16/2020   MPG 137 09/16/2020   No results found for: PROLACTIN Lab Results  Component Value Date   CHOL 108 09/05/2020   TRIG 76 09/05/2020   HDL 44 09/05/2020   CHOLHDL 2.5 09/05/2020   LDLCALC 48 09/05/2020   LDLCALC 95 04/02/2015    Physical Findings: AIMS: Facial and Oral Movements Muscles of Facial Expression: None, normal Lips and Perioral Area: None, normal Jaw: None, normal Tongue: None, normal,Extremity Movements Upper (arms, wrists, hands, fingers): None, normal Lower (legs, knees, ankles, toes): None, normal, Trunk Movements Neck, shoulders, hips: None, normal, Overall Severity Severity of abnormal movements (highest score from questions above): None, normal Incapacitation due to abnormal movements: None, normal Patient's awareness of abnormal movements (rate only patient's report): No Awareness, Dental Status Current problems with teeth and/or dentures?: No Does patient usually wear dentures?: No  CIWA:    COWS:     Musculoskeletal: Strength & Muscle Tone: decreased Gait & Station: ambulated with walker Patient leans: Front  Psychiatric Specialty Exam: Physical Exam Vitals and nursing note reviewed.  Constitutional:      Appearance: Normal appearance.  HENT:     Head: Normocephalic and atraumatic.     Right Ear: External ear normal.     Left Ear: External ear normal.     Nose: Nose normal.     Mouth/Throat:     Mouth: Mucous  membranes are moist.     Pharynx: Oropharynx is clear.  Eyes:     Extraocular Movements: Extraocular movements intact.     Conjunctiva/sclera: Conjunctivae normal.     Pupils: Pupils are equal, round, and reactive to light.  Cardiovascular:     Rate and Rhythm: Normal rate and regular rhythm.     Pulses: Normal pulses.  Pulmonary:     Effort: Pulmonary effort is normal.  Abdominal:     General: Abdomen is flat.     Palpations: Abdomen is soft.  Musculoskeletal:        General: Deformity present. No swelling.     Cervical back: Normal range of motion and neck supple.  Skin:    General: Skin is warm and dry.  Neurological:     General: No focal deficit present.     Mental Status: He is alert and oriented to person, place, and time.  Psychiatric:        Attention and Perception: Attention and perception normal.        Mood and Affect: Mood is depressed. Affect is flat.        Speech: Speech normal.        Behavior: Behavior is withdrawn.        Thought Content: Thought content does not include suicidal ideation.        Cognition and Memory: Cognition and memory normal.        Judgment: Judgment normal.     Review of Systems  Constitutional: Positive for fatigue. Negative for appetite change.  HENT: Negative for rhinorrhea and sore throat.   Eyes: Negative for photophobia and visual disturbance.  Respiratory: Negative for cough and shortness of breath.   Cardiovascular: Negative for chest pain and palpitations.  Gastrointestinal: Positive for  constipation. Negative for diarrhea, nausea and vomiting.  Endocrine: Negative for cold intolerance and heat intolerance.  Genitourinary: Negative for difficulty urinating and dysuria.  Musculoskeletal: Positive for arthralgias, back pain and myalgias.  Skin: Negative for rash and wound.  Allergic/Immunologic: Negative for food allergies and immunocompromised state.  Neurological: Positive for weakness. Negative for dizziness and  headaches.  Psychiatric/Behavioral: Positive for dysphoric mood. Negative for suicidal ideas. The patient is nervous/anxious.     Blood pressure 91/73, pulse 83, temperature 97.7 F (36.5 C), temperature source Oral, resp. rate 18, height 5\' 11"  (1.803 m), weight 98 kg, SpO2 99 %.Body mass index is 30.13 kg/m.  General Appearance: Casual  Eye Contact:  Fair  Speech:  Clear and Coherent  Volume:  Decreased  Mood:  Depressed and Dysphoric  Affect:  Congruent  Thought Process:  Coherent and Linear  Orientation:  Full (Time, Place, and Person)  Thought Content:  Logical  Suicidal Thoughts:  No  Homicidal Thoughts:  No  Memory:  Immediate;   Fair Recent;   Fair Remote;   Fair  Judgement:  Intact  Insight:  Shallow  Psychomotor Activity:  Decreased  Concentration:  Concentration: Fair  Recall:  [employer] of Knowledge:  Fair  Language:  Fair  Akathisia:  Negative  Handed:  Right  AIMS (if indicated):     Assets:  Communication Skills Desire for Improvement Financial Resources/Insurance Housing Intimacy  ADL's:  Intact  Cognition:  WNL  Sleep:  Number of Hours: 7.45     Treatment Plan Summary: Daily contact with patient to assess and evaluate symptoms and progress in treatment and Medication management PLAN OF CARE:70 year old male with history of MDD and GAD presenting after intentional ingestion of Prozac and Remeron resulting in serotonin syndrome and medical admission. At this time patient continues to endorse depressed mood, poor sleep, low energy, anhedonia, hopelessness, helplessness, worthlessness, and passive suicidal ideations. Continue Buproprion XL 150 mg daily for depression, Ativan PRN for anxiety.PT consult completed, and appreciate recommendations. Home Health PT order completed.   [name], MD 12/18/2020, 1:08 PM

## 2020-12-18 NOTE — [facility] Suicide Risk Assessment (Signed)
[facility] INPATIENT:  Family/Significant Other Suicide Prevention Education  Suicide Prevention Education:  Education Completed; Nishant Schrecengost,  (Spouse) has been identified by the patient as the family member/significant other with whom the patient will be residing, and identified as the person(s) who will aid the patient in the event of a mental health crisis (suicidal ideations/suicide attempt).  With written consent from the patient, the family member/significant other has been provided the following suicide prevention education, prior to the and/or following the discharge of the patient.  The suicide prevention education provided includes the following:  Suicide risk factors  Suicide prevention and interventions  National Suicide Hotline telephone number  [facility] assessment telephone number  [facility] Emergency Assistance [facility] and/or Residential Mobile Crisis Unit telephone number  Request made of family/significant other to:  Remove weapons (e.g., guns, rifles, knives), all items previously/currently identified as safety concern.    Remove drugs/medications (over-the-counter, prescriptions, illicit drugs), all items previously/currently identified as a safety concern.  The family member/significant other verbalizes understanding of the suicide prevention education information provided.  The family member/significant other agrees to remove the items of safety concern listed above. Family member has taken an active effort in minimizing risk. She reports that she has taken control of patient's medication, agreed to dispense them daily rather than weekly. All guns have been removed from the home at a previous hospital stay.   Durenda Hurt 12/18/2020, 8:05 AM

## 2020-12-18 NOTE — [facility] Group Notes (Signed)
LCSW Group Therapy Note  12/18/2020 2:12 PM  Type of Therapy/Topic:  Group Therapy:  Balance in Life  Participation Level:  Did Not Attend  Description of Group:    This group will address the concept of balance and how it feels and looks when one is unbalanced. Patients will be encouraged to process areas in their lives that are out of balance and identify reasons for remaining unbalanced. Facilitators will guide patients in utilizing problem-solving interventions to address and correct the stressor making their life unbalanced. Understanding and applying boundaries will be explored and addressed for obtaining and maintaining a balanced life. Patients will be encouraged to explore ways to assertively make their unbalanced needs known to significant others in their lives, using other group members and facilitator for support and feedback.  Therapeutic Goals: 1. Patient will identify two or more emotions or situations they have that consume much of in their lives. 2. Patient will identify signs/triggers that life has become out of balance:  3. Patient will identify two ways to set boundaries in order to achieve balance in their lives:  4. Patient will demonstrate ability to communicate their needs through discussion and/or role plays  Summary of Patient Progress: X  Therapeutic Modalities:   Cognitive Behavioral Therapy Solution-Focused Therapy Assertiveness Training  Hedy Camara R. Guerry Bruin, MSW, LCSW, [place] 12/18/2020 2:12 PM

## 2020-12-18 NOTE — [facility] Group Notes (Signed)
LCSW Group Therapy Note  12/18/2020 2:17 PM  Type of Therapy/Topic:  Group Therapy:  Balance in Life  Participation Level:  Did Not Attend  Description of Group:    This group will address the concept of balance and how it feels and looks when one is unbalanced. Patients will be encouraged to process areas in their lives that are out of balance and identify reasons for remaining unbalanced. Facilitators will guide patients in utilizing problem-solving interventions to address and correct the stressor making their life unbalanced. Understanding and applying boundaries will be explored and addressed for obtaining and maintaining a balanced life. Patients will be encouraged to explore ways to assertively make their unbalanced needs known to significant others in their lives, using other group members and facilitator for support and feedback.  Therapeutic Goals: 1. Patient will identify two or more emotions or situations they have that consume much of in their lives. 2. Patient will identify signs/triggers that life has become out of balance:  3. Patient will identify two ways to set boundaries in order to achieve balance in their lives:  4. Patient will demonstrate ability to communicate their needs through discussion and/or role plays  Summary of Patient Progress: X     Therapeutic Modalities:   Cognitive Behavioral Therapy Solution-Focused Therapy Assertiveness Training  Assunta Curtis MSW, LCSW 12/18/2020 2:17 PM

## 2020-12-18 NOTE — Progress Notes (Signed)
Patient is alert and oriented x 4 no distress noted, he is isolated to his room during this shift, he appears less anxious he rated depression a 6/10 (0 low - 10 high ) he was offered emotional and encouragement, and he was complaint with medication regimen. Will continue to monitor.

## 2020-12-18 NOTE — Progress Notes (Signed)
This Probation officer held patient's Carvedilol because his BP was 91/73. MD was notified.

## 2020-12-18 NOTE — Progress Notes (Signed)
Recreation Therapy Notes  Date: 12/18/2020  Time: 9:30 am   Location: Craft room   Behavioral response: Appropriate  Intervention Topic: Self-esteem    Discussion/Intervention:  Group content today was focused on Self-Care. The group defined self-care and some positive ways they care for themselves. Individuals expressed ways and reasons why they neglected any self-care in the past. Patients described ways to improve self-care in the future. The group explained what could happen if they did not do any self-care activities at all. The group participated in the intervention "self-care assessment" where they had a chance to discover some of their weaknesses and strengths in self- care. Patient came up with a self-care plan to improve themselves in the future.  Clinical Observations/Feedback: Patient came to group late due to unknown reasons. He defined self-esteem as how you perceive yourself and how other perceive you. Participant explained that self-esteem can be affected by peers, environment and parents/ upbringing.  Individual was social with peers and staff while participating in the intervention. Kimbree Casanas LRT/CTRS          Joleah Kosak 12/18/2020 12:11 PM

## 2020-12-18 NOTE — Plan of Care (Signed)
D- Patient alert and oriented. Patient presents in a sad, but pleasant mood on assessment stating that he slept good last night and only had complaints about adjusting his bed. Patient endorsed both depression and anxiety stating that his depression is "not as bad as it was, I just feel low". Patient couldn't verbalize what was making him anxious. Patient denies SI, HI, AVH, and pain at this time. Patient had no stated goals for today.  A- Scheduled medications administered to patient, per MD orders. Support and encouragement provided.  Routine safety checks conducted every 15 minutes.  Patient informed to notify staff with problems or concerns.  R- No adverse drug reactions noted. Patient contracts for safety at this time. Patient compliant with medications and treatment plan. Patient receptive, calm, and cooperative. Patient interacts well with others on the unit.  Patient remains safe at this time.  Problem: Education: Goal: Utilization of techniques to improve thought processes will improve Outcome: Progressing Goal: Knowledge of the prescribed therapeutic regimen will improve Outcome: Progressing   Problem: Activity: Goal: Interest or engagement in leisure activities will improve Outcome: Progressing Goal: Imbalance in normal sleep/wake cycle will improve Outcome: Progressing   Problem: Coping: Goal: Coping ability will improve Outcome: Progressing Goal: Will verbalize feelings Outcome: Progressing   Problem: Health Behavior/Discharge Planning: Goal: Ability to make decisions will improve Outcome: Progressing Goal: Compliance with therapeutic regimen will improve Outcome: Progressing   Problem: Role Relationship: Goal: Will demonstrate positive changes in social behaviors and relationships Outcome: Progressing   Problem: Safety: Goal: Ability to disclose and discuss suicidal ideas will improve Outcome: Progressing Goal: Ability to identify and utilize support systems that  promote safety will improve Outcome: Progressing   Problem: Self-Concept: Goal: Will verbalize positive feelings about self Outcome: Progressing Goal: Level of anxiety will decrease Outcome: Progressing   Problem: Education: Goal: Knowledge of General Education information will improve Description: Including pain rating scale, medication(s)/side effects and non-pharmacologic comfort measures Outcome: Progressing   Problem: Health Behavior/Discharge Planning: Goal: Ability to manage health-related needs will improve Outcome: Progressing   Problem: Clinical Measurements: Goal: Ability to maintain clinical measurements within normal limits will improve Outcome: Progressing Goal: Will remain free from infection Outcome: Progressing Goal: Diagnostic test results will improve Outcome: Progressing Goal: Respiratory complications will improve Outcome: Progressing Goal: Cardiovascular complication will be avoided Outcome: Progressing   Problem: Activity: Goal: Risk for activity intolerance will decrease Outcome: Progressing   Problem: Nutrition: Goal: Adequate nutrition will be maintained Outcome: Progressing   Problem: Coping: Goal: Level of anxiety will decrease Outcome: Progressing   Problem: Elimination: Goal: Will not experience complications related to bowel motility Outcome: Progressing Goal: Will not experience complications related to urinary retention Outcome: Progressing   Problem: Pain Managment: Goal: General experience of comfort will improve Outcome: Progressing   Problem: Safety: Goal: Ability to remain free from injury will improve Outcome: Progressing   Problem: Skin Integrity: Goal: Risk for impaired skin integrity will decrease Outcome: Progressing

## 2020-12-18 NOTE — BHH Counselor (Signed)
CSW met with patient to discuss home health, PT only, referral. Patient was initially ambivalent toward treatment, stating "I had planned on going to [facility]" as an option to regain coordination, endurance, and muscular strength. CSW explained that PT specialists have subject matter expertise in rehabilitation that recreational gyms do not provide. Patient eventually became agreeable with home health referral. CSW made referral to [facility] with written consent of patient. Agency reported that they will see the patient within 48 hours of discharge with an anticipated discharge of Monday 22 December 2020, weather permitting.   Signed:  Durenda Hurt, MSW, [place], LCASA 12/18/2020 11:24 AM

## 2020-12-18 NOTE — Progress Notes (Signed)
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 analyst Resources:  Yes  Community Resources:  [employer]  Current Use: Yes  If no, Barriers?:    Expressed Interest in [place] of Residence:  Insurance underwriter  Patient Main Form of Transportation:    Patient Strengths:  My values  Patient Identified Areas of Improvement:  My depression  Patient Goal for Hospitalization:  Do soemthing about my depression  Current SI (including self-harm):  No  Current HI:  No  Current AVH: No  Staff Intervention Plan: Group Attendance,Collaborate with Interdisciplinary Treatment Team  Consent to Intern Participation: N/A  Emonee Winkowski 12/18/2020, 12:16 PM

## 2020-12-19 NOTE — [facility] Group Notes (Signed)
LCSW Group Therapy Note   12/19/2020 2:00 PM  Type of Therapy and Topic:  Group Therapy:  Overcoming Obstacles   Participation Level:  Did Not Attend   Description of Group:    In this group patients will be encouraged to explore what they see as obstacles to their own wellness and recovery. They will be guided to discuss their thoughts, feelings, and behaviors related to these obstacles. The group will process together ways to cope with barriers, with attention given to specific choices patients can make. Each patient will be challenged to identify changes they are motivated to make in order to overcome their obstacles. This group will be process-oriented, with patients participating in exploration of their own experiences as well as giving and receiving support and challenge from other group members.   Therapeutic Goals: 1. Patient will identify personal and current obstacles as they relate to admission. 2. Patient will identify barriers that currently interfere with their wellness or overcoming obstacles.  3. Patient will identify feelings, thought process and behaviors related to these barriers. 4. Patient will identify two changes they are willing to make to overcome these obstacles:      Summary of Patient Progress X   Therapeutic Modalities:   Cognitive Behavioral Therapy Solution Focused Therapy Motivational Interviewing Relapse Prevention Therapy  Assunta Curtis, MSW, LCSW 12/19/2020 2:00 PM

## 2020-12-19 NOTE — Progress Notes (Signed)
Recreation Therapy Notes  Date: 12/19/2020  Time: 9:30 am   Location: Craft room   Behavioral response: Appropriate  Intervention Topic: Communication     Discussion/Intervention:  Group content today was focused on communication. The group defined communication and ways to communicate with others. Individuals stated reason why communication is important and some reasons to communicate with others. Patients expressed if they thought they were good at communicating with others and ways they could improve their communication skills. The group identified important parts of communication and some experiences they have had in the past with communication. The group participated in the intervention "Words in a Bag", where they had a chance to test out their communication skills and identify ways to improve their communication techniques.  Clinical Observations/Feedback: Patient came to group and explained that he would rate his communication skills 5/10. Individual was social with peers and staff while participating in the intervention. Rocsi Hazelbaker LRT/CTRS           Xitlalli Newhard 12/19/2020 11:39 AM

## 2020-12-19 NOTE — Progress Notes (Signed)
Patient spend time in bed resting then was out in the milieu around dinner time. Received a couple of phone calls and reported that it was a good conversation. Reports that he is eating well. Reported that he got some rest after taking Ativan. Remains pleasant and cooperative. No acute distress noted. Safety monitored as recommended.

## 2020-12-19 NOTE — Plan of Care (Signed)
°  Problem: Education: Goal: Utilization of techniques to improve thought processes will improve Outcome: Progressing Goal: Knowledge of the prescribed therapeutic regimen will improve Outcome: Progressing   Problem: Activity: Goal: Interest or engagement in leisure activities will improve Outcome: Progressing Goal: Imbalance in normal sleep/wake cycle will improve Outcome: Progressing   Problem: Coping: Goal: Coping ability will improve Outcome: Progressing Goal: Will verbalize feelings Outcome: Progressing   Problem: Health Behavior/Discharge Planning: Goal: Ability to make decisions will improve Outcome: Progressing Goal: Compliance with therapeutic regimen will improve Outcome: Progressing   Problem: Role Relationship: Goal: Will demonstrate positive changes in social behaviors and relationships Outcome: Progressing   Problem: Safety: Goal: Ability to disclose and discuss suicidal ideas will improve Outcome: Progressing Goal: Ability to identify and utilize support systems that promote safety will improve Outcome: Progressing   Problem: Self-Concept: Goal: Will verbalize positive feelings about self Outcome: Progressing Goal: Level of anxiety will decrease Outcome: Progressing   Problem: Education: Goal: Knowledge of General Education information will improve Description: Including pain rating scale, medication(s)/side effects and non-pharmacologic comfort measures Outcome: Progressing   Problem: Health Behavior/Discharge Planning: Goal: Ability to manage health-related needs will improve Outcome: Progressing   Problem: Clinical Measurements: Goal: Ability to maintain clinical measurements within normal limits will improve Outcome: Progressing Goal: Will remain free from infection Outcome: Progressing Goal: Diagnostic test results will improve Outcome: Progressing Goal: Respiratory complications will improve Outcome: Progressing Goal: Cardiovascular  complication will be avoided Outcome: Progressing   Problem: Activity: Goal: Risk for activity intolerance will decrease Outcome: Progressing   Problem: Nutrition: Goal: Adequate nutrition will be maintained Outcome: Progressing   Problem: Coping: Goal: Level of anxiety will decrease Outcome: Progressing   Problem: Elimination: Goal: Will not experience complications related to bowel motility Outcome: Progressing Goal: Will not experience complications related to urinary retention Outcome: Progressing   Problem: Pain Managment: Goal: General experience of comfort will improve Outcome: Progressing   Problem: Safety: Goal: Ability to remain free from injury will improve Outcome: Progressing   Problem: Skin Integrity: Goal: Risk for impaired skin integrity will decrease Outcome: Progressing   

## 2020-12-19 NOTE — Plan of Care (Signed)
Cooperative and denying thoughts of self-harm. Attended group and remained active. Complained of anxiety earlier this morning and received Ativan 1 mg PO. Appetite is fair. Patient reported to another nurse that he did not sleep well last night. Has no additional concerns. Support and encouragements provided. Safety precautions maintained.

## 2020-12-19 NOTE — Progress Notes (Signed)
[facility] MD Progress Note  12/19/2020 11:05 AM Jeremy Reese  MRN:  [id number]   Subjective:  70 year old male with history of MDD and GAD presenting after intentional ingestion of Prozac and Remeron resulting in serotonin syndrome and medical admission. Patient had no acute events overnight, medication compliant, attending to ADLs.  Patient seen one-on-one at bedside today. He continues to minimize the seriousness of his suicide attempt, and continues to state he was trying to sleep better.  He feels his anxiety and depression are somewhat better than they were prior to admission. He still has passive thoughts of being better off dead. He is also not attending to his basic daily needs. He is isolating to room to point he feels dehydrated. Encouraged him to get out of bed to attend groups, and meals. Also encouraged increased water intake. When in room encouraged him to open blinds and turn on lights as basic behavioral activation techniques to assist with depression. No side effects noted to Wellbutrin started yesterday.   Principal Problem: MDD (major depressive disorder), recurrent severe, without psychosis ([place]) Diagnosis: Principal Problem:   MDD (major depressive disorder), recurrent severe, without psychosis ([place]) Active Problems:   Hyperlipidemia   Chronic atrial fibrillation (HCC)   Chronic diastolic congestive heart failure (HCC)   Flatback syndrome of thoracolumbar region   Hypertension   Suicide attempt ([place])  Total Time spent with patient: 30 minutes  Past Psychiatric History: Patient has a past history of depression. In 1992 he had a suicide attempt by overdose. Had a hospitalization at that time. Saw Dr. Thurmond Butts for years. Much of the time diagnosed with seasonal affective disorder and took fluoxetine and used a light box in the winter. Between that time and now had not had any other major depressive episodes. No history of mania.  Past Medical History:  Past Medical History:   Diagnosis Date  . Atrial flutter, paroxysmal ([place])   . Carotid artery disease without cerebral infarction ([place])   . Coronary artery disease 2011  . Flatback syndrome of thoracolumbar region   . H/O calcium pyrophosphate deposition disease (CPPD)   . History of TIA (transient ischemic attack)   . Hyperlipidemia   . Paroxysmal A-fib ([place])   . Spinal stenosis of lumbar region without neurogenic claudication     Past Surgical History:  Procedure Laterality Date  . BILATERAL CARPAL TUNNEL RELEASE    . CARDIAC CATHETERIZATION    . CIRCUMCISION, NON-NEWBORN    . CLIPPING OF ATRIAL APPENDAGE N/A 09/18/2020   Procedure: CLIPPING OF ATRIAL APPENDAGE USING 7 ATRICURE LAA  EXCLUSION SYSTEM;  Surgeon: Ivin Poot, MD;  Location: [place];  Service: Open Heart Surgery;  Laterality: N/A;  . CORONARY ANGIOPLASTY WITH STENT PLACEMENT  2011  . CORONARY ARTERY BYPASS GRAFT N/A 09/18/2020   Procedure: CORONARY ARTERY BYPASS GRAFTING (CABG)X 3, ON PUMP, USING LEFT INTERAL MAMMARY ARTERY AND ENDOSCOPICALLY HARVESTED RIGHT GREATER SAPHENOUS VEIN. LIMA TO LAD, SVG TO OM, SVG TO PD;  Surgeon: Ivin Poot, MD;  Location: [place];  Service: Open Heart Surgery;  Laterality: N/A;  . ENDOVEIN HARVEST OF GREATER SAPHENOUS VEIN Right 09/18/2020   Procedure: ENDOVEIN HARVEST OF GREATER SAPHENOUS VEIN;  Surgeon: Ivin Poot, MD;  Location: [place];  Service: Open Heart Surgery;  Laterality: Right;  . LEFT HEART CATH AND CORONARY ANGIOGRAPHY Left 09/12/2020   Procedure: LEFT HEART CATH AND CORONARY ANGIOGRAPHY;  Surgeon: Minna Merritts, MD;  Location: [place] [facility];  Service: Cardiovascular;  Laterality: Left;  . TEE WITHOUT CARDIOVERSION N/A 09/18/2020   Procedure: TRANSESOPHAGEAL ECHOCARDIOGRAM (TEE);  Surgeon: Prescott Gum, Collier Salina, MD;  Location: [place];  Service: Open Heart Surgery;  Laterality: N/A;   Family History:  Family History  Problem Relation Age of Onset  . Heart disease Mother 42        CABG x 3   . Hypertension Father    Family Psychiatric  History: Denies Social History:  Social History   Substance and Sexual Activity  Alcohol Use Yes   Comment: rare      Social History   Substance and Sexual Activity  Drug Use No    Social History   Socioeconomic History  . Marital status: Married    Spouse name: Not on file  . Number of children: Not on file  . Years of education: Not on file  . Highest education level: Not on file  Occupational History  . Not on file  Tobacco Use  . Smoking status: Former Smoker    Packs/day: 0.25    Years: 35.00    Pack years: 8.75    Types: Cigarettes    Quit date: 11/29/1993    Years since quitting: 27.0  . Smokeless tobacco: Never Used  Vaping Use  . Vaping Use: Never used  Substance and Sexual Activity  . Alcohol use: Yes    Comment: rare   . Drug use: No  . Sexual activity: Not on file  Other Topics Concern  . Not on file  Social History Narrative  . Not on file   Social Determinants of Health   Financial Resource Strain: Not on file  Food Insecurity: Not on file  Transportation Needs: Not on file  Physical Activity: Not on file  Stress: Not on file  Social Connections: Not on file   Additional Social History:                         Sleep: Fair  Appetite:  Fair  Current Medications: Current Facility-Administered Medications  Medication Dose Route Frequency Provider Last Rate Last Admin  . acetaminophen (TYLENOL) tablet 650 mg  650 mg Oral Q6H PRN [name], MD   650 mg at 12/19/20 [id number]  . alum & mag hydroxide-simeth (MAALOX/MYLANTA) 200-200-20 MG/5ML suspension 30 mL  30 mL Oral Q4H PRN [name], MD      . apixaban [name]) tablet 5 mg  5 mg Oral BID [name], MD   5 mg at 12/19/20 0809  . aspirin EC tablet 81 mg  81 mg Oral Daily [name], MD   81 mg at 12/19/20 [id number]  . buPROPion (WELLBUTRIN XL) 24 hr tablet 150 mg  150 mg Oral Daily [name], MD   150  mg at 12/19/20 [id number]  . carvedilol (COREG) tablet 25 mg  25 mg Oral BID WC [name], MD   25 mg at 12/19/20 [id number]  . ezetimibe (ZETIA) tablet 10 mg  10 mg Oral Daily [name], MD   10 mg at 12/19/20 0809  . feeding supplement (ENSURE ENLIVE / ENSURE PLUS) liquid 237 mL  237 mL Oral BID BM [name], MD   237 mL at 12/19/20 1027  . gabapentin (NEURONTIN) capsule 400 mg  400 mg Oral TID [name], MD   400 mg at 12/19/20 [id number]  . LORazepam (ATIVAN) tablet 1 mg  1 mg Oral Q6H PRN Selina Cooley  M, MD   1 mg at 12/19/20 0819  . magnesium hydroxide (MILK OF MAGNESIA) suspension 30 mL  30 mL Oral Daily PRN [name], MD      . multivitamin with minerals tablet 1 tablet  1 tablet Oral Daily [name], MD   1 tablet at 12/19/20 [phone]  . nitroGLYCERIN (NITROSTAT) SL tablet 0.4 mg  0.4 mg Sublingual Q5 min PRN [name], MD      . phenylephrine-shark liver oil-mineral oil-petrolatum (PREPARATION H) rectal ointment 1 application  1 application Rectal BID PRN [name], MD      . polyethylene glycol (MIRALAX / GLYCOLAX) packet 17 g  17 g Oral Daily [name], MD   17 g at 12/19/20 7048  . rosuvastatin (CRESTOR) tablet 40 mg  40 mg Oral Daily [name], MD   40 mg at 12/19/20 0810  . senna-docusate (Senokot-S) tablet 2 tablet  2 tablet Oral Daily [name], MD   2 tablet at 12/19/20 8891    Lab Results:  No results found for this or any previous visit (from the past 48 hour(s)).  Blood Alcohol level:  Lab Results  Component Value Date   ETH 16 (H) 12/11/2020   ETH <10 69/45/0388    Metabolic Disorder Labs: Lab Results  Component Value Date   HGBA1C 6.4 (H) 09/16/2020   MPG 137 09/16/2020   No results found for: PROLACTIN Lab Results  Component Value Date   CHOL 108 09/05/2020   TRIG 76 09/05/2020   HDL 44 09/05/2020   CHOLHDL 2.5 09/05/2020   LDLCALC 48 09/05/2020   LDLCALC 95 04/02/2015    Physical  Findings: AIMS: Facial and Oral Movements Muscles of Facial Expression: None, normal Lips and Perioral Area: None, normal Jaw: None, normal Tongue: None, normal,Extremity Movements Upper (arms, wrists, hands, fingers): None, normal Lower (legs, knees, ankles, toes): None, normal, Trunk Movements Neck, shoulders, hips: None, normal, Overall Severity Severity of abnormal movements (highest score from questions above): None, normal Incapacitation due to abnormal movements: None, normal Patient's awareness of abnormal movements (rate only patient's report): No Awareness, Dental Status Current problems with teeth and/or dentures?: No Does patient usually wear dentures?: No  CIWA:    COWS:     Musculoskeletal: Strength & Muscle Tone: decreased Gait & Station: ambulated with walker Patient leans: Front  Psychiatric Specialty Exam: Physical Exam Vitals and nursing note reviewed.  Constitutional:      Appearance: Normal appearance.  HENT:     Head: Normocephalic and atraumatic.     Right Ear: External ear normal.     Left Ear: External ear normal.     Nose: Nose normal.     Mouth/Throat:     Mouth: Mucous membranes are moist.     Pharynx: Oropharynx is clear.  Eyes:     Extraocular Movements: Extraocular movements intact.     Conjunctiva/sclera: Conjunctivae normal.     Pupils: Pupils are equal, round, and reactive to light.  Cardiovascular:     Rate and Rhythm: Normal rate and regular rhythm.     Pulses: Normal pulses.  Pulmonary:     Effort: Pulmonary effort is normal.  Abdominal:     General: Abdomen is flat.     Palpations: Abdomen is soft.  Musculoskeletal:        General: Deformity present. No swelling.     Cervical back: Normal range of motion and neck supple.  Skin:    General:  Skin is warm and dry.  Neurological:     General: No focal deficit present.     Mental Status: He is alert and oriented to person, place, and time.  Psychiatric:        Attention and  Perception: Attention and perception normal.        Mood and Affect: Mood is depressed. Affect is flat.        Speech: Speech normal.        Behavior: Behavior is withdrawn.        Thought Content: Thought content includes suicidal ideation. Thought content does not include suicidal plan.        Cognition and Memory: Cognition and memory normal.        Judgment: Judgment normal.     Review of Systems  Constitutional: Positive for fatigue. Negative for appetite change.  HENT: Negative for rhinorrhea and sore throat.   Eyes: Negative for photophobia and visual disturbance.  Respiratory: Negative for cough and shortness of breath.   Cardiovascular: Negative for chest pain and palpitations.  Gastrointestinal: Positive for constipation. Negative for diarrhea, nausea and vomiting.  Endocrine: Negative for cold intolerance and heat intolerance.  Genitourinary: Negative for difficulty urinating and dysuria.  Musculoskeletal: Positive for arthralgias, back pain and myalgias.  Skin: Negative for rash and wound.  Allergic/Immunologic: Negative for food allergies and immunocompromised state.  Neurological: Positive for weakness. Negative for dizziness and headaches.  Psychiatric/Behavioral: Positive for dysphoric mood. Negative for suicidal ideas. The patient is nervous/anxious.     Blood pressure (!) 138/94, pulse 87, temperature 97.7 F (36.5 C), temperature source Oral, resp. rate 18, height 5\' 11"  (1.803 m), weight 98 kg, SpO2 96 %.Body mass index is 30.13 kg/m.  General Appearance: Casual  Eye Contact:  Fair  Speech:  Clear and Coherent  Volume:  Decreased  Mood:  Depressed and Dysphoric  Affect:  Congruent  Thought Process:  Coherent and Linear  Orientation:  Full (Time, Place, and Person)  Thought Content:  Logical  Suicidal Thoughts:  Yes.  without intent/plan  Homicidal Thoughts:  No  Memory:  Immediate;   Fair Recent;   Fair Remote;   Fair  Judgement:  Intact  Insight:   Shallow  Psychomotor Activity:  Decreased  Concentration:  Concentration: Fair  Recall:  [employer] of Knowledge:  Fair  Language:  Fair  Akathisia:  Negative  Handed:  Right  AIMS (if indicated):     Assets:  Communication Skills Desire for Improvement Financial Resources/Insurance Housing Intimacy  ADL's:  Intact  Cognition:  WNL  Sleep:  Number of Hours: 8.25     Treatment Plan Summary: Daily contact with patient to assess and evaluate symptoms and progress in treatment and Medication management PLAN OF CARE:70 year old male with history of MDD and GAD presenting after intentional ingestion of Prozac and Remeron resulting in serotonin syndrome and medical admission. At this time patient continues to endorse depressed mood, poor sleep, low energy, anhedonia, hopelessness, helplessness, worthlessness, and passive suicidal ideations. Continue Buproprion XL 150 mg daily for depression, Ativan PRN for anxiety.PT consult completed, and appreciate recommendations. Home Health PT order completed.   [name], MD 12/19/2020, 11:05 AM

## 2020-12-19 NOTE — Progress Notes (Signed)
Physical Therapy Treatment Patient Details Name: Jeremy Reese MRN: [id number] DOB: Oct 02, 1951 Today's Date: 12/19/2020    History of Present Illness 70 yo male with onset of severe depression and taking meds in large quantity was admitted to [place], after not having slept in several days.  Pt has been overwhelmed since dc from prev psych admission, with suicidal thoughts and low energy.  Referred to PT to assess mobility.  PMHx: CHF, HLD, a-fib, CABG, CAD    PT Comments    Pt was pleasant and willing to participate with PT services after education provided on physiological benefits of activity.  Pt required extra time and effort with bed mobility and transfers but no physical assistance.  Pt was able to amb 150 feet with a RW with slow cadence and with significant trunk flexion that improved with cues.  Pt put forth good effort with below therex and reported no adverse symptoms during the session other than mild nausea and back pain.  Pt's SpO2 was 100% and HR 85 bpm after ambulation.  Pt will benefit from HHPT services upon discharge to safely address deficits listed in patient problem list for decreased caregiver assistance and eventual return to PLOF.    Follow Up Recommendations  Home health PT;Supervision for mobility/OOB     Equipment Recommendations  Rolling walker with 5" wheels    Recommendations for Other Services       Precautions / Restrictions Precautions Precautions: Fall Precaution Comments: back pain chronically Restrictions Weight Bearing Restrictions: No    Mobility  Bed Mobility Overal bed mobility: Needs Assistance Bed Mobility: Supine to Sit;Sit to Supine     Supine to sit: Supervision Sit to supine: Supervision   General bed mobility comments: Extra time and effort required; pt educated on Log Roll technique for chronic back pain control  Transfers Overall transfer level: Needs assistance Equipment used: Rolling walker (2 wheeled);1 person hand held  assist Transfers: Sit to/from Stand Sit to Stand: Supervision         General transfer comment: Min verbal cues for sequencing with pt able to stand without assist with extra time and effort  Ambulation/Gait Ambulation/Gait assistance: Supervision Gait Distance (Feet): 150 Feet Assistive device: Rolling walker (2 wheeled) Gait Pattern/deviations: Step-through pattern;Decreased stride length;Wide base of support Gait velocity: reduced   General Gait Details: Mod verbal and visual cues for sequencing for amb closer to the RW with upright posture   Stairs             Wheelchair Mobility    Modified Rankin (Stroke Patients Only)       Balance Overall balance assessment: Needs assistance Sitting-balance support: Feet supported Sitting balance-Leahy Scale: Good     Standing balance support: Bilateral upper extremity supported;During functional activity Standing balance-Leahy Scale: Fair Standing balance comment: Mod lean on the RW for support                            Cognition Arousal/Alertness: Awake/alert Behavior During Therapy: Flat affect Overall Cognitive Status: Within Functional Limits for tasks assessed                                        Exercises Total Joint Exercises Ankle Circles/Pumps: AROM;Strengthening;Both;15 reps Quad Sets: Strengthening;Both;15 reps Gluteal Sets: Strengthening;Both;15 reps Heel Slides: AROM;Strengthening;Both;10 reps Straight Leg Raises: Strengthening;Both;10 reps Long Arc Quad: AROM;Strengthening;Both;10 reps  Knee Flexion: AROM;Strengthening;Both;10 reps Other Exercises Other Exercises: HEP education for BLE APs, QS, GS, and LAQs x 10 each every 1-2 hours Other Exercises: Log roll training  Pt education provided on physiological benefits of activity    General Comments        Pertinent Vitals/Pain Pain Assessment: 0-10 Pain Score: 4  Pain Location: back Pain Descriptors /  Indicators: Sore Pain Intervention(s): Monitored during session    Home Living                      Prior Function            PT Goals (current goals can now be found in the care plan section) Progress towards PT goals: Progressing toward goals    Frequency    Min 2X/week      PT Plan Current plan remains appropriate    Co-evaluation              AM-PAC PT "6 Clicks" Mobility   Outcome Measure  Help needed turning from your back to your side while in a flat bed without using bedrails?: None Help needed moving from lying on your back to sitting on the side of a flat bed without using bedrails?: A Little Help needed moving to and from a bed to a chair (including a wheelchair)?: A Little Help needed standing up from a chair using your arms (e.g., wheelchair or bedside chair)?: A Little Help needed to walk in hospital room?: A Little Help needed climbing 3-5 steps with a railing? : A Little 6 Click Score: 19    End of Session Equipment Utilized During Treatment: Gait belt Activity Tolerance: Patient tolerated treatment well Patient left: in bed Nurse Communication: Mobility status PT Visit Diagnosis: Unsteadiness on feet (R26.81);Muscle weakness (generalized) (M62.81)     Time: 7001-7494 PT Time Calculation (min) (ACUTE ONLY): 23 min  Charges:  $Gait Training: 8-22 mins $Therapeutic Exercise: 8-22 mins                     D. Scott Marlyss Cissell PT, DPT 12/19/20, 3:45 PM

## 2020-12-19 NOTE — Progress Notes (Signed)
Isolative to room. Calm and cooperative. Denies SI HI and AVH

## 2020-12-20 LAB — CBC
HCT: 41.6 % (ref 39.0–52.0)
Hemoglobin: 13.6 g/dL (ref 13.0–17.0)
MCH: 31.9 pg (ref 26.0–34.0)
MCHC: 32.7 g/dL (ref 30.0–36.0)
MCV: 97.4 fL (ref 80.0–100.0)
Platelets: 146 10*3/uL — ABNORMAL LOW (ref 150–400)
RBC: 4.27 MIL/uL (ref 4.22–5.81)
RDW: 13.1 % (ref 11.5–15.5)
WBC: 7 10*3/uL (ref 4.0–10.5)
nRBC: 0 % (ref 0.0–0.2)

## 2020-12-20 MED ORDER — LISINOPRIL 5 MG PO TABS
5.0000 mg | ORAL_TABLET | Freq: Every day | ORAL | Status: DC
  Administered 2020-12-20 – 2020-12-29 (×10): 5 mg via ORAL
  Filled 2020-12-20 (×11): qty 1

## 2020-12-20 MED ORDER — OLOPATADINE HCL 0.1 % OP SOLN
1.0000 [drp] | Freq: Four times a day (QID) | OPHTHALMIC | Status: DC | PRN
  Filled 2020-12-20: qty 5

## 2020-12-20 NOTE — Progress Notes (Signed)
[facility] MD Progress Note  12/20/2020 10:40 AM Jeremy Reese  MRN:  [id number] Subjective: Follow-up for 70 year old man with Major depression status post suicide attempt.  Patient seen chart reviewed.  Case reviewed with nursing.  Patient reports his mood is better.  He denies any suicidal thoughts.  Continues to express anxiety mostly about his wife and about his situation at home in a general way.  Patient's energy level still seems to be somewhat low stays in bed much of the time.  He has complained a couple of times over the last day of his eyes for itching and burning.  Problem comes and goes and was relieved with eyedrops yesterday.  Noted that blood pressure continues to be slightly elevated although tremor appears to be resolved. Principal Problem: MDD (major depressive disorder), recurrent severe, without psychosis ([place]) Diagnosis: Principal Problem:   MDD (major depressive disorder), recurrent severe, without psychosis ([place]) Active Problems:   Hyperlipidemia   Chronic atrial fibrillation (HCC)   Chronic diastolic congestive heart failure (HCC)   Flatback syndrome of thoracolumbar region   Hypertension   Suicide attempt ([place])  Total Time spent with patient: 30 minutes  Past Psychiatric History: Patient has a history of major depression and has had more than 1 suicide attempt.  Only partial response to medication treatment so far.  Past Medical History:  Past Medical History:  Diagnosis Date  . Atrial flutter, paroxysmal ([place])   . Carotid artery disease without cerebral infarction ([place])   . Coronary artery disease 2011  . Flatback syndrome of thoracolumbar region   . H/O calcium pyrophosphate deposition disease (CPPD)   . History of TIA (transient ischemic attack)   . Hyperlipidemia   . Paroxysmal A-fib ([place])   . Spinal stenosis of lumbar region without neurogenic claudication     Past Surgical History:  Procedure Laterality Date  . BILATERAL CARPAL TUNNEL RELEASE    .  CARDIAC CATHETERIZATION    . CIRCUMCISION, NON-NEWBORN    . CLIPPING OF ATRIAL APPENDAGE N/A 09/18/2020   Procedure: CLIPPING OF ATRIAL APPENDAGE USING 16 ATRICURE LAA  EXCLUSION SYSTEM;  Surgeon: Ivin Poot, MD;  Location: [place];  Service: Open Heart Surgery;  Laterality: N/A;  . CORONARY ANGIOPLASTY WITH STENT PLACEMENT  2011  . CORONARY ARTERY BYPASS GRAFT N/A 09/18/2020   Procedure: CORONARY ARTERY BYPASS GRAFTING (CABG)X 3, ON PUMP, USING LEFT INTERAL MAMMARY ARTERY AND ENDOSCOPICALLY HARVESTED RIGHT GREATER SAPHENOUS VEIN. LIMA TO LAD, SVG TO OM, SVG TO PD;  Surgeon: Ivin Poot, MD;  Location: [facility];  Service: Open Heart Surgery;  Laterality: N/A;  . ENDOVEIN HARVEST OF GREATER SAPHENOUS VEIN Right 09/18/2020   Procedure: ENDOVEIN HARVEST OF GREATER SAPHENOUS VEIN;  Surgeon: Ivin Poot, MD;  Location: [facility];  Service: Open Heart Surgery;  Laterality: Right;  . LEFT HEART CATH AND CORONARY ANGIOGRAPHY Left 09/12/2020   Procedure: LEFT HEART CATH AND CORONARY ANGIOGRAPHY;  Surgeon: Minna Merritts, MD;  Location: [facility];  Service: Cardiovascular;  Laterality: Left;  . TEE WITHOUT CARDIOVERSION N/A 09/18/2020   Procedure: TRANSESOPHAGEAL ECHOCARDIOGRAM (TEE);  Surgeon: Prescott Gum, Collier Salina, MD;  Location: [facility];  Service: Open Heart Surgery;  Laterality: N/A;   Family History:  Family History  Problem Relation Age of Onset  . Heart disease Mother 55       CABG x 3   . Hypertension Father    Family Psychiatric  History: See previous.  Nothing specific reported Social History:  Social History   Substance and Sexual Activity  Alcohol Use Yes   Comment: rare      Social History   Substance and Sexual Activity  Drug Use No    Social History   Socioeconomic History  . Marital status: Married    Spouse name: Not on file  . Number of children: Not on file  . Years of education: Not on file  . Highest education level: Not on file  Occupational History   . Not on file  Tobacco Use  . Smoking status: Former Smoker    Packs/day: 0.25    Years: 35.00    Pack years: 8.75    Types: Cigarettes    Quit date: 11/29/1993    Years since quitting: 27.0  . Smokeless tobacco: Never Used  Vaping Use  . Vaping Use: Never used  Substance and Sexual Activity  . Alcohol use: Yes    Comment: rare   . Drug use: No  . Sexual activity: Not on file  Other Topics Concern  . Not on file  Social History Narrative  . Not on file   Social Determinants of Health   Financial Resource Strain: Not on file  Food Insecurity: Not on file  Transportation Needs: Not on file  Physical Activity: Not on file  Stress: Not on file  Social Connections: Not on file   Additional Social History:                         Sleep: Fair  Appetite:  Fair  Current Medications: Current Facility-Administered Medications  Medication Dose Route Frequency Provider Last Rate Last Admin  . acetaminophen (TYLENOL) tablet 650 mg  650 mg Oral Q6H PRN [name], MD   650 mg at 12/20/20 0804  . alum & mag hydroxide-simeth (MAALOX/MYLANTA) 200-200-20 MG/5ML suspension 30 mL  30 mL Oral Q4H PRN [name], MD      . apixaban [name]) tablet 5 mg  5 mg Oral BID [name], MD   5 mg at 12/20/20 0804  . aspirin EC tablet 81 mg  81 mg Oral Daily [name], MD   81 mg at 12/20/20 [id number]  . buPROPion (WELLBUTRIN XL) 24 hr tablet 150 mg  150 mg Oral Daily [name], MD   150 mg at 12/20/20 0804  . carvedilol (COREG) tablet 25 mg  25 mg Oral BID WC [name], MD   25 mg at 12/20/20 0805  . ezetimibe (ZETIA) tablet 10 mg  10 mg Oral Daily [name], MD   10 mg at 12/20/20 0805  . feeding supplement (ENSURE ENLIVE / ENSURE PLUS) liquid 237 mL  237 mL Oral BID BM [name], MD   237 mL at 12/19/20 1334  . gabapentin (NEURONTIN) capsule 400 mg  400 mg Oral TID [name], MD   400 mg at 12/20/20 0805  . lisinopril (ZESTRIL)  tablet 5 mg  5 mg Oral Daily Nicholas Ossa T, MD      . LORazepam (ATIVAN) tablet 1 mg  1 mg Oral Q6H PRN [name], MD   1 mg at 12/20/20 0804  . magnesium hydroxide (MILK OF MAGNESIA) suspension 30 mL  30 mL Oral Daily PRN [name], MD      . multivitamin with minerals tablet 1 tablet  1 tablet Oral Daily [name], MD   1 tablet at 12/20/20 0805  .  nitroGLYCERIN (NITROSTAT) SL tablet 0.4 mg  0.4 mg Sublingual Q5 min PRN [name], MD      . olopatadine (PATANOL) 0.1 % ophthalmic solution 1 drop  1 drop Both Eyes Q6H PRN Marguerite Jarboe, [name], MD      . phenylephrine-shark liver oil-mineral oil-petrolatum (PREPARATION H) rectal ointment 1 application  1 application Rectal BID PRN [name], MD      . polyethylene glycol (MIRALAX / GLYCOLAX) packet 17 g  17 g Oral Daily [name], MD   17 g at 12/20/20 0803  . rosuvastatin (CRESTOR) tablet 40 mg  40 mg Oral Daily [name], MD   40 mg at 12/20/20 0804  . senna-docusate (Senokot-S) tablet 2 tablet  2 tablet Oral Daily [name], MD   2 tablet at 12/20/20 5621    Lab Results: No results found for this or any previous visit (from the past 48 hour(s)).  Blood Alcohol level:  Lab Results  Component Value Date   ETH 16 (H) 12/11/2020   ETH <10 30/86/5784    Metabolic Disorder Labs: Lab Results  Component Value Date   HGBA1C 6.4 (H) 09/16/2020   MPG 137 09/16/2020   No results found for: PROLACTIN Lab Results  Component Value Date   CHOL 108 09/05/2020   TRIG 76 09/05/2020   HDL 44 09/05/2020   CHOLHDL 2.5 09/05/2020   LDLCALC 48 09/05/2020   LDLCALC 95 04/02/2015    Physical Findings: AIMS: Facial and Oral Movements Muscles of Facial Expression: None, normal Lips and Perioral Area: None, normal Jaw: None, normal Tongue: None, normal,Extremity Movements Upper (arms, wrists, hands, fingers): None, normal Lower (legs, knees, ankles, toes): None, normal, Trunk Movements Neck,  shoulders, hips: None, normal, Overall Severity Severity of abnormal movements (highest score from questions above): None, normal Incapacitation due to abnormal movements: None, normal Patient's awareness of abnormal movements (rate only patient's report): No Awareness, Dental Status Current problems with teeth and/or dentures?: No Does patient usually wear dentures?: No  CIWA:    COWS:     Musculoskeletal: Strength & Muscle Tone: within normal limits Gait & Station: normal Patient leans: N/A  Psychiatric Specialty Exam: Physical Exam Vitals and nursing note reviewed.  Constitutional:      Appearance: He is well-developed and well-nourished.  HENT:     Head: Normocephalic and atraumatic.  Eyes:     Conjunctiva/sclera: Conjunctivae normal.     Pupils: Pupils are equal, round, and reactive to light.  Cardiovascular:     Heart sounds: Normal heart sounds.  Pulmonary:     Effort: Pulmonary effort is normal.  Abdominal:     Palpations: Abdomen is soft.  Musculoskeletal:        General: Normal range of motion.     Cervical back: Normal range of motion.  Skin:    General: Skin is warm and dry.  Neurological:     General: No focal deficit present.     Mental Status: He is alert.  Psychiatric:        Attention and Perception: Attention normal.        Mood and Affect: Affect is blunt.        Speech: Speech is delayed.        Behavior: Behavior is slowed.        Thought Content: Thought content is not paranoid. Thought content does not include homicidal or suicidal ideation.        Cognition and Memory: Cognition is  impaired.        Judgment: Judgment normal.     Review of Systems  Constitutional: Negative.   HENT: Negative.   Eyes: Positive for itching.  Respiratory: Negative.   Cardiovascular: Negative.   Gastrointestinal: Negative.   Musculoskeletal: Negative.   Skin: Negative.   Neurological: Negative.   Psychiatric/Behavioral: Negative for suicidal ideas. The  patient is nervous/anxious.     Blood pressure (!) 136/92, pulse 77, temperature 97.7 F (36.5 C), temperature source Oral, resp. rate 18, height 5\' 11"  (1.803 m), weight 98 kg, SpO2 100 %.Body mass index is 30.13 kg/m.  General Appearance: Disheveled  Eye Contact:  Fair  Speech:  Slow  Volume:  Decreased  Mood:  Euthymic  Affect:  Constricted  Thought Process:  Coherent  Orientation:  Full (Time, Place, and Person)  Thought Content:  Logical and Rumination  Suicidal Thoughts:  No  Homicidal Thoughts:  No  Memory:  Immediate;   Fair Recent;   Fair Remote;   Fair  Judgement:  Fair  Insight:  Shallow  Psychomotor Activity:  Decreased  Concentration:  Concentration: Fair  Recall:  [employer] of Knowledge:  Fair  Language:  Fair  Akathisia:  No  Handed:  Right  AIMS (if indicated):     Assets:  Desire for Improvement Financial Resources/Insurance Housing Social Support  ADL's:  Impaired  Cognition:  Impaired,  Mild  Sleep:  Number of Hours: 7.5     Treatment Plan Summary: Medication management and Plan Patient seems to have shown some improvement in his depression although signs and symptoms of depression and anxiety are still noticeable.  Insight only partial but he does deny any suicidal thoughts.  Encourage patient to be up out of bed taking care of his health more active on the ward and talking with staff.  No change to antidepressant medicine.  Reviewing blood pressure I have added a small dose of lisinopril because of persistent elevated blood pressure just 5 mg.  I have also ordered as needed Pataday ophthalmic drops for eye itching.  Alethia Berthold, MD 12/20/2020, 10:40 AM

## 2020-12-20 NOTE — Progress Notes (Signed)
Patient calm and compliant during assessment denying HI/AVH. Patient endorses anxiety and depression. Patient endorses passive SI, verbally contracts for safety. Patient observed interacting appropriately with staff and peers on the unit. Patient didn't have any night medications scheduled and didn't request anything PRN. Patient given education, support, and encouragement to be active in his treatment plan. Patient being monitored Q 15 minutes for safety per unit protocol. Pt remains safe on the unit. 

## 2020-12-20 NOTE — BHH Group Notes (Signed)
LCSW Group Therapy Note  12/20/2020 2:24 PM  Type of Therapy and Topic:  Group Therapy: Avoiding Self-Sabotaging and Enabling Behaviors  Participation Level:  Did Not Attend   Description of Group:   In this group, patients will learn how to identify obstacles, self-sabotaging and enabling behaviors, as well as: what are they, why do we do them and what needs these behaviors meet. Discuss unhealthy relationships and how to have positive healthy boundaries with those that sabotage and enable. Explore aspects of self-sabotage and enabling in yourself and how to limit these self-destructive behaviors in everyday life.   Therapeutic Goals: 1. Patient will identify one obstacle that relates to self-sabotage and enabling behaviors 2. Patient will identify one personal self-sabotaging or enabling behavior they did prior to admission 3. Patient will state a plan to change the above identified behavior 4. Patient will demonstrate ability to communicate their needs through discussion and/or role play.   Summary of Patient Progress: Patient did not attend group despite encouraged participation.   Therapeutic Modalities:   Cognitive Behavioral Therapy Person-Centered Therapy Motivational Interviewing   Paulla Dolly, MSW, [place], [place] 12/20/2020 2:24 PM

## 2020-12-20 NOTE — BHH Counselor (Signed)
CSW met with patient while completing rounds. CSW informed patient of outcomes of home health pt referral (will see pt. within 48h of discharge) and outpatient therapy referral (left VM w/ provider). Patient reported that he has felt particularly sleepy on this day. Patient expressed no other needs at this time.   Signed:  Durenda Hurt, MSW, [place], LCASA 12/20/2020 3:19 PM

## 2020-12-20 NOTE — Plan of Care (Signed)
D- Patient alert and oriented. Patient presents in an anxious, but pleasant mood on assessment stating that he slept "fairly good" last night and had complaints of chronic back pain. Patient rated his pain a "4/10", in which he did request pain medication from this Probation officer. Patient also endorsed both depression and anxiety, stating that "my wife's home by herself in this weather". Patient also requested PRN anxiety medication. Patient denies SI, HI, AVH at this time. Patient's goal for today is to "exercise and get more strength".  A- Scheduled medications administered to patient, per MD orders. Support and encouragement provided.  Routine safety checks conducted every 15 minutes.  Patient informed to notify staff with problems or concerns.  R- No adverse drug reactions noted. Patient contracts for safety at this time. Patient compliant with medications and treatment plan. Patient receptive, calm, and cooperative. Patient interacts well with others on the unit.  Patient remains safe at this time.  Problem: Education: Goal: Utilization of techniques to improve thought processes will improve Outcome: Progressing Goal: Knowledge of the prescribed therapeutic regimen will improve Outcome: Progressing   Problem: Activity: Goal: Interest or engagement in leisure activities will improve Outcome: Progressing Goal: Imbalance in normal sleep/wake cycle will improve Outcome: Progressing   Problem: Coping: Goal: Coping ability will improve Outcome: Progressing Goal: Will verbalize feelings Outcome: Progressing   Problem: Health Behavior/Discharge Planning: Goal: Ability to make decisions will improve Outcome: Progressing Goal: Compliance with therapeutic regimen will improve Outcome: Progressing   Problem: Role Relationship: Goal: Will demonstrate positive changes in social behaviors and relationships Outcome: Progressing   Problem: Safety: Goal: Ability to disclose and discuss suicidal ideas  will improve Outcome: Progressing Goal: Ability to identify and utilize support systems that promote safety will improve Outcome: Progressing   Problem: Self-Concept: Goal: Will verbalize positive feelings about self Outcome: Progressing Goal: Level of anxiety will decrease Outcome: Progressing   Problem: Education: Goal: Knowledge of General Education information will improve Description: Including pain rating scale, medication(s)/side effects and non-pharmacologic comfort measures Outcome: Progressing   Problem: Health Behavior/Discharge Planning: Goal: Ability to manage health-related needs will improve Outcome: Progressing   Problem: Clinical Measurements: Goal: Ability to maintain clinical measurements within normal limits will improve Outcome: Progressing Goal: Will remain free from infection Outcome: Progressing Goal: Diagnostic test results will improve Outcome: Progressing Goal: Respiratory complications will improve Outcome: Progressing Goal: Cardiovascular complication will be avoided Outcome: Progressing   Problem: Activity: Goal: Risk for activity intolerance will decrease Outcome: Progressing   Problem: Nutrition: Goal: Adequate nutrition will be maintained Outcome: Progressing   Problem: Coping: Goal: Level of anxiety will decrease Outcome: Progressing   Problem: Elimination: Goal: Will not experience complications related to bowel motility Outcome: Progressing Goal: Will not experience complications related to urinary retention Outcome: Progressing   Problem: Pain Managment: Goal: General experience of comfort will improve Outcome: Progressing   Problem: Safety: Goal: Ability to remain free from injury will improve Outcome: Progressing   Problem: Skin Integrity: Goal: Risk for impaired skin integrity will decrease Outcome: Progressing

## 2020-12-20 NOTE — Progress Notes (Signed)
This Probation officer held patient's evening Carvedilol because his BP was 99/65.

## 2020-12-21 NOTE — Progress Notes (Signed)
Patient remained isolative to room, got up and walked around at about 5:30. Denies SI, HI and AVH

## 2020-12-21 NOTE — Plan of Care (Signed)
Patient is out of bed this morning and active in the milieu. Pleasant on approach but appears worried and pensive. Reports that he slept ok. Denying thoughts of self-harm. Denying hallucinations. Patient ate breakfast: appetite is good. Requested Ativan along with his AM medications. No additional concerns so far. Support offered and safety maintained.

## 2020-12-21 NOTE — BHH Group Notes (Signed)
[facility] LCSW Group Therapy Note  Date/Time: 12/21/2020 @ 1pm  Type of Therapy/Topic:  Group Therapy:  Feelings about Diagnosis  Participation Level:  Did Not Attend   Mood: Did not attend    Description of Group:    This group will allow patients to explore their thoughts and feelings about diagnoses they have received. Patients will be guided to explore their level of understanding and acceptance of these diagnoses. Facilitator will encourage patients to process their thoughts and feelings about the reactions of others to their diagnosis, and will guide patients in identifying ways to discuss their diagnosis with significant others in their lives. This group will be process-oriented, with patients participating in exploration of their own experiences as well as giving and receiving support and challenge from other group members.   Therapeutic Goals: 1. Patient will demonstrate understanding of diagnosis as evidence by identifying two or more symptoms of the disorder:  2. Patient will be able to express two feelings regarding the diagnosis 3. Patient will demonstrate ability to communicate their needs through discussion and/or role plays  Summary of Patient Progress:    Patient did not attend group today.     Therapeutic Modalities:   Cognitive Behavioral Therapy Brief Therapy Feelings Identification   Ardelle Anton, LCSW

## 2020-12-21 NOTE — Plan of Care (Signed)
  Problem: Education: Goal: Utilization of techniques to improve thought processes will improve Outcome: Progressing Goal: Knowledge of the prescribed therapeutic regimen will improve Outcome: Progressing   Problem: Activity: Goal: Interest or engagement in leisure activities will improve Outcome: Progressing Goal: Imbalance in normal sleep/wake cycle will improve Outcome: Progressing   Problem: Coping: Goal: Coping ability will improve Outcome: Progressing Goal: Will verbalize feelings Outcome: Progressing   Problem: Health Behavior/Discharge Planning: Goal: Ability to make decisions will improve Outcome: Progressing Goal: Compliance with therapeutic regimen will improve Outcome: Progressing   Problem: Role Relationship: Goal: Will demonstrate positive changes in social behaviors and relationships Outcome: Progressing   Problem: Safety: Goal: Ability to disclose and discuss suicidal ideas will improve Outcome: Progressing Goal: Ability to identify and utilize support systems that promote safety will improve Outcome: Progressing   Problem: Self-Concept: Goal: Will verbalize positive feelings about self Outcome: Progressing Goal: Level of anxiety will decrease Outcome: Progressing   Problem: Education: Goal: Knowledge of General Education information will improve Description: Including pain rating scale, medication(s)/side effects and non-pharmacologic comfort measures Outcome: Progressing   Problem: Health Behavior/Discharge Planning: Goal: Ability to manage health-related needs will improve Outcome: Progressing   Problem: Clinical Measurements: Goal: Ability to maintain clinical measurements within normal limits will improve Outcome: Progressing Goal: Will remain free from infection Outcome: Progressing Goal: Diagnostic test results will improve Outcome: Progressing Goal: Respiratory complications will improve Outcome: Progressing Goal: Cardiovascular  complication will be avoided Outcome: Progressing   Problem: Activity: Goal: Risk for activity intolerance will decrease Outcome: Progressing   Problem: Nutrition: Goal: Adequate nutrition will be maintained Outcome: Progressing   Problem: Coping: Goal: Level of anxiety will decrease Outcome: Progressing   Problem: Elimination: Goal: Will not experience complications related to bowel motility Outcome: Progressing Goal: Will not experience complications related to urinary retention Outcome: Progressing   Problem: Pain Managment: Goal: General experience of comfort will improve Outcome: Progressing   Problem: Safety: Goal: Ability to remain free from injury will improve Outcome: Progressing   Problem: Skin Integrity: Goal: Risk for impaired skin integrity will decrease Outcome: Progressing

## 2020-12-21 NOTE — Progress Notes (Signed)
Pt has been sleeping 

## 2020-12-21 NOTE — Progress Notes (Signed)
[facility] MD Progress Note  12/21/2020 12:54 PM BRISTOL GINLEY  MRN:  [id number] Subjective: Follow-up patient with major depression.  Continues to state he feels his mood is a little better.  Feels like the Wellbutrin makes him feel jittery otherwise no new complaints.  Blood pressure is improved.  Denies acute suicidal ideation. Principal Problem: MDD (major depressive disorder), recurrent severe, without psychosis ([place]) Diagnosis: Principal Problem:   MDD (major depressive disorder), recurrent severe, without psychosis ([place]) Active Problems:   Hyperlipidemia   Chronic atrial fibrillation (HCC)   Chronic diastolic congestive heart failure (HCC)   Flatback syndrome of thoracolumbar region   Hypertension   Suicide attempt ([place])  Total Time spent with patient: 30 minutes  Past Psychiatric History: Past history of recurrent depression and suicidal behavior  Past Medical History:  Past Medical History:  Diagnosis Date  . Atrial flutter, paroxysmal ([place])   . Carotid artery disease without cerebral infarction ([place])   . Coronary artery disease 2011  . Flatback syndrome of thoracolumbar region   . H/O calcium pyrophosphate deposition disease (CPPD)   . History of TIA (transient ischemic attack)   . Hyperlipidemia   . Paroxysmal A-fib ([place])   . Spinal stenosis of lumbar region without neurogenic claudication     Past Surgical History:  Procedure Laterality Date  . BILATERAL CARPAL TUNNEL RELEASE    . CARDIAC CATHETERIZATION    . CIRCUMCISION, NON-NEWBORN    . CLIPPING OF ATRIAL APPENDAGE N/A 09/18/2020   Procedure: CLIPPING OF ATRIAL APPENDAGE USING 43 ATRICURE LAA  EXCLUSION SYSTEM;  Surgeon: Ivin Poot, MD;  Location: [place];  Service: Open Heart Surgery;  Laterality: N/A;  . CORONARY ANGIOPLASTY WITH STENT PLACEMENT  2011  . CORONARY ARTERY BYPASS GRAFT N/A 09/18/2020   Procedure: CORONARY ARTERY BYPASS GRAFTING (CABG)X 3, ON PUMP, USING LEFT INTERAL MAMMARY ARTERY AND  ENDOSCOPICALLY HARVESTED RIGHT GREATER SAPHENOUS VEIN. LIMA TO LAD, SVG TO OM, SVG TO PD;  Surgeon: Ivin Poot, MD;  Location: [place];  Service: Open Heart Surgery;  Laterality: N/A;  . ENDOVEIN HARVEST OF GREATER SAPHENOUS VEIN Right 09/18/2020   Procedure: ENDOVEIN HARVEST OF GREATER SAPHENOUS VEIN;  Surgeon: Ivin Poot, MD;  Location: [place];  Service: Open Heart Surgery;  Laterality: Right;  . LEFT HEART CATH AND CORONARY ANGIOGRAPHY Left 09/12/2020   Procedure: LEFT HEART CATH AND CORONARY ANGIOGRAPHY;  Surgeon: Minna Merritts, MD;  Location: [place];  Service: Cardiovascular;  Laterality: Left;  . TEE WITHOUT CARDIOVERSION N/A 09/18/2020   Procedure: TRANSESOPHAGEAL ECHOCARDIOGRAM (TEE);  Surgeon: Prescott Gum, Collier Salina, MD;  Location: [place];  Service: Open Heart Surgery;  Laterality: N/A;   Family History:  Family History  Problem Relation Age of Onset  . Heart disease Mother 12       CABG x 3   . Hypertension Father    Family Psychiatric  History: She previous Social History:  Social History   Substance and Sexual Activity  Alcohol Use Yes   Comment: rare      Social History   Substance and Sexual Activity  Drug Use No    Social History   Socioeconomic History  . Marital status: Married    Spouse name: Not on file  . Number of children: Not on file  . Years of education: Not on file  . Highest education level: Not on file  Occupational History  . Not on file  Tobacco Use  . Smoking status: Former Smoker  Packs/day: 0.25    Years: 35.00    Pack years: 8.75    Types: Cigarettes    Quit date: 11/29/1993    Years since quitting: 27.0  . Smokeless tobacco: Never Used  Vaping Use  . Vaping Use: Never used  Substance and Sexual Activity  . Alcohol use: Yes    Comment: rare   . Drug use: No  . Sexual activity: Not on file  Other Topics Concern  . Not on file  Social History Narrative  . Not on file   Social Determinants of Health    Financial Resource Strain: Not on file  Food Insecurity: Not on file  Transportation Needs: Not on file  Physical Activity: Not on file  Stress: Not on file  Social Connections: Not on file   Additional Social History:                         Sleep: Fair  Appetite:  Fair  Current Medications: Current Facility-Administered Medications  Medication Dose Route Frequency Provider Last Rate Last Admin  . acetaminophen (TYLENOL) tablet 650 mg  650 mg Oral Q6H PRN [name], MD   650 mg at 12/20/20 0804  . alum & mag hydroxide-simeth (MAALOX/MYLANTA) 200-200-20 MG/5ML suspension 30 mL  30 mL Oral Q4H PRN [name], MD      . apixaban [name]) tablet 5 mg  5 mg Oral BID [name], MD   5 mg at 12/21/20 [id number]  . aspirin EC tablet 81 mg  81 mg Oral Daily [name], MD   81 mg at 12/21/20 [id number]  . buPROPion (WELLBUTRIN XL) 24 hr tablet 150 mg  150 mg Oral Daily [name], MD   150 mg at 12/21/20 [id number]  . carvedilol (COREG) tablet 25 mg  25 mg Oral BID WC [name], MD   25 mg at 12/21/20 0803  . ezetimibe (ZETIA) tablet 10 mg  10 mg Oral Daily [name], MD   10 mg at 12/21/20 0804  . feeding supplement (ENSURE ENLIVE / ENSURE PLUS) liquid 237 mL  237 mL Oral BID BM [name], MD   237 mL at 12/21/20 1113  . gabapentin (NEURONTIN) capsule 400 mg  400 mg Oral TID [name], MD   400 mg at 12/21/20 [id number]  . lisinopril (ZESTRIL) tablet 5 mg  5 mg Oral Daily Ehren Berisha, [name], MD   5 mg at 12/21/20 0805  . LORazepam (ATIVAN) tablet 1 mg  1 mg Oral Q6H PRN [name], MD   1 mg at 12/21/20 [id number]  . magnesium hydroxide (MILK OF MAGNESIA) suspension 30 mL  30 mL Oral Daily PRN [name], MD      . multivitamin with minerals tablet 1 tablet  1 tablet Oral Daily [name], MD   1 tablet at 12/21/20 [id number]  . nitroGLYCERIN (NITROSTAT) SL tablet 0.4 mg  0.4 mg Sublingual Q5 min PRN [name], MD      . olopatadine  (PATANOL) 0.1 % ophthalmic solution 1 drop  1 drop Both Eyes Q6H PRN Zalia Hautala, [name], MD      . phenylephrine-shark liver oil-mineral oil-petrolatum (PREPARATION H) rectal ointment 1 application  1 application Rectal BID PRN [name], MD      . polyethylene glycol (MIRALAX / GLYCOLAX) packet 17 g  17 g Oral Daily [name], MD   [phone]  g at 12/21/20 0803  . rosuvastatin (CRESTOR) tablet 40 mg  40 mg Oral Daily [name], MD   40 mg at 12/21/20 0805  . senna-docusate (Senokot-S) tablet 2 tablet  2 tablet Oral Daily [name], MD   2 tablet at 12/21/20 0806    Lab Results:  Results for orders placed or performed during the hospital encounter of 12/16/20 (from the past 48 hour(s))  CBC     Status: Abnormal   Collection Time: 12/20/20 11:42 AM  Result Value Ref Range   WBC 7.0 4.0 - 10.5 K/uL   RBC 4.27 4.22 - 5.81 MIL/uL   Hemoglobin 13.6 13.0 - 17.0 g/dL   HCT 41.6 39.0 - 52.0 %   MCV 97.4 80.0 - 100.0 fL   MCH 31.9 26.0 - 34.0 pg   MCHC 32.7 30.0 - 36.0 g/dL   RDW 13.1 11.5 - 15.5 %   Platelets 146 (L) 150 - 400 K/uL   nRBC 0.0 0.0 - 0.2 %    Comment: Performed at [facility], [address] [place], [place] [address]    Blood Alcohol level:  Lab Results  Component Value Date   ETH 16 (H) 12/11/2020   ETH <10 54/00/8676    Metabolic Disorder Labs: Lab Results  Component Value Date   HGBA1C 6.4 (H) 09/16/2020   MPG 137 09/16/2020   No results found for: PROLACTIN Lab Results  Component Value Date   CHOL 108 09/05/2020   TRIG 76 09/05/2020   HDL 44 09/05/2020   CHOLHDL 2.5 09/05/2020   LDLCALC 48 09/05/2020   LDLCALC 95 04/02/2015    Physical Findings: AIMS: Facial and Oral Movements Muscles of Facial Expression: None, normal Lips and Perioral Area: None, normal Jaw: None, normal Tongue: None, normal,Extremity Movements Upper (arms, wrists, hands, fingers): None, normal Lower (legs, knees, ankles, toes): None, normal, Trunk  Movements Neck, shoulders, hips: None, normal, Overall Severity Severity of abnormal movements (highest score from questions above): None, normal Incapacitation due to abnormal movements: None, normal Patient's awareness of abnormal movements (rate only patient's report): No Awareness, Dental Status Current problems with teeth and/or dentures?: No Does patient usually wear dentures?: No  CIWA:    COWS:     Musculoskeletal: Strength & Muscle Tone: within normal limits Gait & Station: normal Patient leans: N/A  Psychiatric Specialty Exam: Physical Exam Vitals and nursing note reviewed.  Constitutional:      Appearance: He is well-developed and well-nourished.  HENT:     Head: Normocephalic and atraumatic.  Eyes:     Conjunctiva/sclera: Conjunctivae normal.     Pupils: Pupils are equal, round, and reactive to light.  Cardiovascular:     Heart sounds: Normal heart sounds.  Pulmonary:     Effort: Pulmonary effort is normal.  Abdominal:     Palpations: Abdomen is soft.  Musculoskeletal:        General: Normal range of motion.     Cervical back: Normal range of motion.  Skin:    General: Skin is warm and dry.  Neurological:     General: No focal deficit present.     Mental Status: He is alert.  Psychiatric:        Attention and Perception: Attention normal.        Mood and Affect: Affect is blunt.        Speech: Speech is delayed.        Behavior: Behavior is slowed.  Thought Content: Thought content does not include suicidal ideation.        Cognition and Memory: Cognition is impaired.        Judgment: Judgment normal.     Review of Systems  Constitutional: Negative.   HENT: Negative.   Eyes: Negative.   Respiratory: Negative.   Cardiovascular: Negative.   Gastrointestinal: Negative.   Musculoskeletal: Negative.   Skin: Negative.   Neurological: Negative.   Psychiatric/Behavioral: Positive for dysphoric mood. Negative for suicidal ideas. The patient is  nervous/anxious.     Blood pressure 135/86, pulse 98, temperature 98.3 F (36.8 C), temperature source Oral, resp. rate 17, height 5\' 11"  (1.803 m), weight 98 kg, SpO2 98 %.Body mass index is 30.13 kg/m.  General Appearance: Disheveled  Eye Contact:  Fair  Speech:  Slow  Volume:  Decreased  Mood:  Depressed  Affect:  Blunt  Thought Process:  Coherent  Orientation:  Full (Time, Place, and Person)  Thought Content:  Logical  Suicidal Thoughts:  No  Homicidal Thoughts:  No  Memory:  Immediate;   Fair Recent;   Fair Remote;   Fair  Judgement:  Fair  Insight:  Fair  Psychomotor Activity:  Decreased  Concentration:  Concentration: Fair  Recall:  [employer] of Knowledge:  Fair  Language:  Fair  Akathisia:  No  Handed:  Right  AIMS (if indicated):     Assets:  Desire for Improvement Housing Social Support  ADL's:  Impaired  Cognition:  Impaired,  Mild  Sleep:  Number of Hours: 8.25     Treatment Plan Summary: Medication management and Plan No change to medication.  Reviewed overall treatment plan.  Psychoeducation and encouragement completed.  Blood pressure improved compared to yesterday no other change to medicine.  Alethia Berthold, MD 12/21/2020, 12:54 PM

## 2020-12-22 MED ORDER — HYDROXYZINE HCL 25 MG PO TABS
25.0000 mg | ORAL_TABLET | Freq: Three times a day (TID) | ORAL | Status: DC
  Administered 2020-12-22 – 2020-12-23 (×2): 25 mg via ORAL
  Filled 2020-12-22 (×2): qty 1

## 2020-12-22 NOTE — [facility] Counselor (Signed)
CSW received call from [facility], at this time she is unable to accept new patients.    CSW will review with the patient alternative options.    Assunta Curtis, MSW, LCSW 12/22/2020 9:47 AM

## 2020-12-22 NOTE — Plan of Care (Signed)
  Problem: Education: Goal: Utilization of techniques to improve thought processes will improve Outcome: Progressing Goal: Knowledge of the prescribed therapeutic regimen will improve Outcome: Progressing   Problem: Activity: Goal: Interest or engagement in leisure activities will improve Outcome: Progressing Goal: Imbalance in normal sleep/wake cycle will improve Outcome: Progressing   Problem: Coping: Goal: Coping ability will improve Outcome: Progressing Goal: Will verbalize feelings Outcome: Progressing   Problem: Health Behavior/Discharge Planning: Goal: Ability to make decisions will improve Outcome: Progressing Goal: Compliance with therapeutic regimen will improve Outcome: Progressing   Problem: Role Relationship: Goal: Will demonstrate positive changes in social behaviors and relationships Outcome: Progressing   Problem: Safety: Goal: Ability to disclose and discuss suicidal ideas will improve Outcome: Progressing Goal: Ability to identify and utilize support systems that promote safety will improve Outcome: Progressing   Problem: Self-Concept: Goal: Will verbalize positive feelings about self Outcome: Progressing Goal: Level of anxiety will decrease Outcome: Progressing   Problem: Education: Goal: Knowledge of General Education information will improve Description: Including pain rating scale, medication(s)/side effects and non-pharmacologic comfort measures Outcome: Progressing   Problem: Health Behavior/Discharge Planning: Goal: Ability to manage health-related needs will improve Outcome: Progressing   Problem: Clinical Measurements: Goal: Ability to maintain clinical measurements within normal limits will improve Outcome: Progressing Goal: Will remain free from infection Outcome: Progressing Goal: Diagnostic test results will improve Outcome: Progressing Goal: Respiratory complications will improve Outcome: Progressing Goal: Cardiovascular  complication will be avoided Outcome: Progressing   Problem: Activity: Goal: Risk for activity intolerance will decrease Outcome: Progressing   Problem: Nutrition: Goal: Adequate nutrition will be maintained Outcome: Progressing   Problem: Coping: Goal: Level of anxiety will decrease Outcome: Progressing   Problem: Elimination: Goal: Will not experience complications related to bowel motility Outcome: Progressing Goal: Will not experience complications related to urinary retention Outcome: Progressing   Problem: Pain Managment: Goal: General experience of comfort will improve Outcome: Progressing   Problem: Safety: Goal: Ability to remain free from injury will improve Outcome: Progressing   Problem: Skin Integrity: Goal: Risk for impaired skin integrity will decrease Outcome: Progressing

## 2020-12-22 NOTE — Progress Notes (Signed)
D: Pt alert and oriented. Pt rates depression 2/10, hopelessness 2/10, and anxiety 2/10. Pt reports energy level as low and concentration as being good. Pt reports sleep last night as being good. Pt did receive medications for sleep and did find them helpful. Pt reports experiencing chronic lower back pain 4/10, scheduled meds given. Pt denies experiencing any SI/HI, or AVH during at this time.    A: Scheduled medications administered to pt, per MD orders. Support and encouragement provided. Frequent verbal contact made. Routine safety checks conducted q15 minutes.   R: No adverse drug reactions noted. Pt verbally contracts for safety at this time. Pt complaint with medications. Pt interacts minimally with others on the unit. Pt comes out for medication pass and meal and spends the rest of his time in his room lying in bed. Pt remains safe at this time. Will continue to monitor.

## 2020-12-22 NOTE — Progress Notes (Signed)
[facility] MD Progress Note  12/22/2020 11:56 AM Jeremy Reese  MRN:  [id number]   Subjective:  70 year old male with history of MDD and GAD presenting after intentional ingestion of Prozac and Remeron resulting in serotonin syndrome and medical admission. Patient had no acute events overnight, medication compliant, attending to ADLs.  Patient seen one-on-one at bedside today. He feels his anxiety and depression are somewhat better than they were prior to admission, but he still does not feel safe to return home at this time. He still has passive thoughts of being better off dead, and some general feelings of hopelessness that he will never feel happy again. He has been eating a little better, but continues to isolate to his room. He worked with physical therapy this morning, and feels his weakness is improving.  No side effects noted to medications.   Attempted to contact [name], [phone], for collateral. No answer, voicemail with call back number left.   Principal Problem: MDD (major depressive disorder), recurrent severe, without psychosis ([place]) Diagnosis: Principal Problem:   MDD (major depressive disorder), recurrent severe, without psychosis ([place]) Active Problems:   Hyperlipidemia   Chronic atrial fibrillation (HCC)   Chronic diastolic congestive heart failure (HCC)   Flatback syndrome of thoracolumbar region   Hypertension   Suicide attempt ([place])  Total Time spent with patient: 30 minutes  Past Psychiatric History: Patient has a past history of depression. In 1992 he had a suicide attempt by overdose. Had a hospitalization at that time. Saw Dr. Thurmond Butts for years. Much of the time diagnosed with seasonal affective disorder and took fluoxetine and used a light box in the winter. Between that time and now had not had any other major depressive episodes. No history of mania.  Past Medical History:  Past Medical History:  Diagnosis Date   Atrial flutter, paroxysmal ([place])     Carotid artery disease without cerebral infarction [facility]    Coronary artery disease 2011   Flatback syndrome of thoracolumbar region    H/O calcium pyrophosphate deposition disease (CPPD)    History of TIA (transient ischemic attack)    Hyperlipidemia    Paroxysmal A-fib (HCC)    Spinal stenosis of lumbar region without neurogenic claudication     Past Surgical History:  Procedure Laterality Date   BILATERAL CARPAL TUNNEL RELEASE     CARDIAC CATHETERIZATION     CIRCUMCISION, NON-NEWBORN     CLIPPING OF ATRIAL APPENDAGE N/A 09/18/2020   Procedure: CLIPPING OF ATRIAL APPENDAGE USING 36 ATRICURE LAA  EXCLUSION SYSTEM;  Surgeon: Ivin Poot, MD;  Location: [place];  Service: Open Heart Surgery;  Laterality: N/A;   CORONARY ANGIOPLASTY WITH STENT PLACEMENT  2011   CORONARY ARTERY BYPASS GRAFT N/A 09/18/2020   Procedure: CORONARY ARTERY BYPASS GRAFTING (CABG)X 3, ON PUMP, USING LEFT INTERAL MAMMARY ARTERY AND ENDOSCOPICALLY HARVESTED RIGHT GREATER SAPHENOUS VEIN. LIMA TO LAD, SVG TO OM, SVG TO PD;  Surgeon: Ivin Poot, MD;  Location: [place];  Service: Open Heart Surgery;  Laterality: N/A;   ENDOVEIN HARVEST OF GREATER SAPHENOUS VEIN Right 09/18/2020   Procedure: ENDOVEIN HARVEST OF GREATER SAPHENOUS VEIN;  Surgeon: Ivin Poot, MD;  Location: [place];  Service: Open Heart Surgery;  Laterality: Right;   LEFT HEART CATH AND CORONARY ANGIOGRAPHY Left 09/12/2020   Procedure: LEFT HEART CATH AND CORONARY ANGIOGRAPHY;  Surgeon: Minna Merritts, MD;  Location: [place] [facility];  Service: Cardiovascular;  Laterality: Left;   TEE WITHOUT CARDIOVERSION N/A 09/18/2020  Procedure: TRANSESOPHAGEAL ECHOCARDIOGRAM (TEE);  Surgeon: Prescott Gum, Collier Salina, MD;  Location: [place];  Service: Open Heart Surgery;  Laterality: N/A;   Family History:  Family History  Problem Relation Age of Onset   Heart disease Mother 55       CABG x 3    Hypertension Father    Family Psychiatric   History: Denies Social History:  Social History   Substance and Sexual Activity  Alcohol Use Yes   Comment: rare      Social History   Substance and Sexual Activity  Drug Use No    Social History   Socioeconomic History   Marital status: Married    Spouse name: Not on file   Number of children: Not on file   Years of education: Not on file   Highest education level: Not on file  Occupational History   Not on file  Tobacco Use   Smoking status: Former Smoker    Packs/day: 0.25    Years: 35.00    Pack years: 8.75    Types: Cigarettes    Quit date: 11/29/1993    Years since quitting: 27.0   Smokeless tobacco: Never Used  Scientific laboratory technician Use: Never used  Substance and Sexual Activity   Alcohol use: Yes    Comment: rare    Drug use: No   Sexual activity: Not on file  Other Topics Concern   Not on file  Social History Narrative   Not on file   Social Determinants of Health   Financial Resource Strain: Not on file  Food Insecurity: Not on file  Transportation Needs: Not on file  Physical Activity: Not on file  Stress: Not on file  Social Connections: Not on file   Additional Social History:                         Sleep: Fair  Appetite:  Fair  Current Medications: Current Facility-Administered Medications  Medication Dose Route Frequency Provider Last Rate Last Admin   acetaminophen (TYLENOL) tablet 650 mg  650 mg Oral Q6H PRN [name], MD   650 mg at 12/21/20 2019   alum & mag hydroxide-simeth (MAALOX/MYLANTA) 200-200-20 MG/5ML suspension 30 mL  30 mL Oral Q4H PRN [name], MD       apixaban [name]) tablet 5 mg  5 mg Oral BID [name], MD   5 mg at 12/22/20 0740   aspirin EC tablet 81 mg  81 mg Oral Daily [name], MD   81 mg at 12/22/20 9024   buPROPion (WELLBUTRIN XL) 24 hr tablet 150 mg  150 mg Oral Daily [name], MD   150 mg at 12/22/20 0739   carvedilol (COREG) tablet 25 mg   25 mg Oral BID WC [name], MD   25 mg at 12/22/20 0739   ezetimibe (ZETIA) tablet 10 mg  10 mg Oral Daily [name], MD   10 mg at 12/22/20 0739   feeding supplement (ENSURE ENLIVE / ENSURE PLUS) liquid 237 mL  237 mL Oral BID BM [name], MD   237 mL at 12/22/20 1050   gabapentin (NEURONTIN) capsule 400 mg  400 mg Oral TID [name], MD   400 mg at 12/22/20 0739   lisinopril (ZESTRIL) tablet 5 mg  5 mg Oral Daily [name], [name], MD   5 mg at 12/22/20 0739   LORazepam (  ATIVAN) tablet 1 mg  1 mg Oral Q6H PRN [name], MD   1 mg at 12/21/20 2018   magnesium hydroxide (MILK OF MAGNESIA) suspension 30 mL  30 mL Oral Daily PRN [name], MD       multivitamin with minerals tablet 1 tablet  1 tablet Oral Daily [name], MD   1 tablet at 12/22/20 1062   nitroGLYCERIN (NITROSTAT) SL tablet 0.4 mg  0.4 mg Sublingual Q5 min PRN [name], MD       olopatadine (PATANOL) 0.1 % ophthalmic solution 1 drop  1 drop Both Eyes Q6H PRN [name], [name], MD       phenylephrine-shark liver oil-mineral oil-petrolatum (PREPARATION H) rectal ointment 1 application  1 application Rectal BID PRN [name], MD       polyethylene glycol (MIRALAX / GLYCOLAX) packet 17 g  17 g Oral Daily [name], MD   17 g at 12/22/20 0740   rosuvastatin (CRESTOR) tablet 40 mg  40 mg Oral Daily [name], MD   40 mg at 12/22/20 0740   senna-docusate (Senokot-S) tablet 2 tablet  2 tablet Oral Daily [name], MD   2 tablet at 12/22/20 6948    Lab Results:  No results found for this or any previous visit (from the past 48 hour(s)).  Blood Alcohol level:  Lab Results  Component Value Date   ETH 16 (H) 12/11/2020   ETH <10 54/62/7035    Metabolic Disorder Labs: Lab Results  Component Value Date   HGBA1C 6.4 (H) 09/16/2020   MPG 137 09/16/2020   No results found for: PROLACTIN Lab Results  Component Value Date   CHOL 108  09/05/2020   TRIG 76 09/05/2020   HDL 44 09/05/2020   CHOLHDL 2.5 09/05/2020   LDLCALC 48 09/05/2020   LDLCALC 95 04/02/2015    Physical Findings: AIMS: Facial and Oral Movements Muscles of Facial Expression: None, normal Lips and Perioral Area: None, normal Jaw: None, normal Tongue: None, normal,Extremity Movements Upper (arms, wrists, hands, fingers): None, normal Lower (legs, knees, ankles, toes): None, normal, Trunk Movements Neck, shoulders, hips: None, normal, Overall Severity Severity of abnormal movements (highest score from questions above): None, normal Incapacitation due to abnormal movements: None, normal Patient's awareness of abnormal movements (rate only patient's report): No Awareness, Dental Status Current problems with teeth and/or dentures?: No Does patient usually wear dentures?: No  CIWA:    COWS:     Musculoskeletal: Strength & Muscle Tone: decreased Gait & Station: ambulated with walker Patient leans: Front  Psychiatric Specialty Exam: Physical Exam Vitals and nursing note reviewed.  Constitutional:      Appearance: Normal appearance.  HENT:     Head: Normocephalic and atraumatic.     Right Ear: External ear normal.     Left Ear: External ear normal.     Nose: Nose normal.     Mouth/Throat:     Mouth: Mucous membranes are moist.     Pharynx: Oropharynx is clear.  Eyes:     Extraocular Movements: Extraocular movements intact.     Conjunctiva/sclera: Conjunctivae normal.     Pupils: Pupils are equal, round, and reactive to light.  Cardiovascular:     Rate and Rhythm: Normal rate and regular rhythm.     Pulses: Normal pulses.  Pulmonary:     Effort: Pulmonary effort is normal.  Abdominal:     General: Abdomen is flat.  Palpations: Abdomen is soft.  Musculoskeletal:        General: Deformity present. No swelling.     Cervical back: Normal range of motion and neck supple.  Skin:    General: Skin is warm and dry.  Neurological:      General: No focal deficit present.     Mental Status: He is alert and oriented to person, place, and time.  Psychiatric:        Attention and Perception: Attention and perception normal.        Mood and Affect: Mood is depressed. Affect is flat.        Speech: Speech normal.        Behavior: Behavior is withdrawn.        Thought Content: Thought content includes suicidal ideation. Thought content does not include suicidal plan.        Cognition and Memory: Cognition and memory normal.        Judgment: Judgment normal.     Review of Systems  Constitutional: Positive for fatigue. Negative for appetite change.  HENT: Negative for rhinorrhea and sore throat.   Eyes: Negative for photophobia and visual disturbance.  Respiratory: Negative for cough and shortness of breath.   Cardiovascular: Negative for chest pain and palpitations.  Gastrointestinal: Positive for constipation. Negative for diarrhea, nausea and vomiting.  Endocrine: Negative for cold intolerance and heat intolerance.  Genitourinary: Negative for difficulty urinating and dysuria.  Musculoskeletal: Positive for arthralgias, back pain and myalgias.  Skin: Negative for rash and wound.  Allergic/Immunologic: Negative for food allergies and immunocompromised state.  Neurological: Positive for weakness. Negative for dizziness and headaches.  Psychiatric/Behavioral: Positive for dysphoric mood. Negative for suicidal ideas. The patient is nervous/anxious.     Blood pressure (!) 149/92, pulse 66, temperature 97.7 F (36.5 C), temperature source Oral, resp. rate 17, height 5\' 11"  (1.803 m), weight 98 kg, SpO2 97 %.Body mass index is 30.13 kg/m.  General Appearance: Casual  Eye Contact:  Fair  Speech:  Clear and Coherent  Volume:  Decreased  Mood:  Depressed and Dysphoric  Affect:  Congruent  Thought Process:  Coherent and Linear  Orientation:  Full (Time, Place, and Person)  Thought Content:  Logical  Suicidal Thoughts:  Yes.   without intent/plan  Homicidal Thoughts:  No  Memory:  Immediate;   Fair Recent;   Fair Remote;   Fair  Judgement:  Intact  Insight:  Shallow  Psychomotor Activity:  Decreased  Concentration:  Concentration: Fair  Recall:  [employer] of Knowledge:  Fair  Language:  Fair  Akathisia:  Negative  Handed:  Right  AIMS (if indicated):     Assets:  Communication Skills Desire for Improvement Financial Resources/Insurance Housing Intimacy  ADL's:  Intact  Cognition:  WNL  Sleep:  Number of Hours: 7.75     Treatment Plan Summary: Daily contact with patient to assess and evaluate symptoms and progress in treatment and Medication management PLAN OF CARE:70 year old male with history of MDD and GAD presenting after intentional ingestion of Prozac and Remeron resulting in serotonin syndrome and medical admission. At this time patient continues to endorse depressed mood, poor sleep, low energy, anhedonia, hopelessness, helplessness, worthlessness, and passive suicidal ideations. Continue Buproprion XL 150 mg daily for depression, Ativan PRN for anxiety.PT consult completed, and appreciate recommendations. Home Health PT order completed.   [name], MD 12/22/2020, 11:56 AM

## 2020-12-22 NOTE — [facility] Group Notes (Signed)
LCSW Group Therapy Note   12/22/2020 1:38 PM  Type of Therapy and Topic:  Group Therapy:  Overcoming Obstacles   Participation Level:  Did Not Attend   Description of Group:    In this group patients will be encouraged to explore what they see as obstacles to their own wellness and recovery. They will be guided to discuss their thoughts, feelings, and behaviors related to these obstacles. The group will process together ways to cope with barriers, with attention given to specific choices patients can make. Each patient will be challenged to identify changes they are motivated to make in order to overcome their obstacles. This group will be process-oriented, with patients participating in exploration of their own experiences as well as giving and receiving support and challenge from other group members.   Therapeutic Goals: 1. Patient will identify personal and current obstacles as they relate to admission. 2. Patient will identify barriers that currently interfere with their wellness or overcoming obstacles.  3. Patient will identify feelings, thought process and behaviors related to these barriers. 4. Patient will identify two changes they are willing to make to overcome these obstacles:      Summary of Patient Progress X   Therapeutic Modalities:   Cognitive Behavioral Therapy Solution Focused Therapy Motivational Interviewing Relapse Prevention Therapy  Assunta Curtis, MSW, LCSW 12/22/2020 1:38 PM

## 2020-12-22 NOTE — Progress Notes (Signed)
Requested and received Ativan for anxiety per prn order at 2030. Denies SI HI and AVH

## 2020-12-22 NOTE — [facility] Group Notes (Signed)
[place] Group Notes:  (Nursing/MHT/Case Management/Adjunct)  Date:  12/22/2020  Time:  8:54 PM  Type of Therapy:  Group Therapy  Participation Level:  Did Not Attend  Jeremy Reese 12/22/2020, 8:54 PM

## 2020-12-22 NOTE — Tx Team (Signed)
Interdisciplinary Treatment and Diagnostic Plan Update  12/22/2020 Time of Session: 8:30AM Jeremy Reese MRN: [id number]  Principal Diagnosis: MDD (major depressive disorder), recurrent severe, without psychosis ([name])  Secondary Diagnoses: Principal Problem:   MDD (major depressive disorder), recurrent severe, without psychosis ([name]) Active Problems:   Hyperlipidemia   Chronic atrial fibrillation ([name])   Chronic diastolic congestive heart failure ([name])   Flatback syndrome of thoracolumbar region   Hypertension   Suicide attempt [name])   Current Medications:  Current Facility-Administered Medications  Medication Dose Route Frequency Provider Last Rate Last Admin  . acetaminophen (TYLENOL) tablet 650 mg  650 mg Oral Q6H PRN [name], MD   650 mg at 12/21/20 2019  . alum & mag hydroxide-simeth (MAALOX/MYLANTA) 200-200-20 MG/5ML suspension 30 mL  30 mL Oral Q4H PRN [name], MD      . apixaban [name]) tablet 5 mg  5 mg Oral BID [name], MD   5 mg at 12/22/20 0740  . aspirin EC tablet 81 mg  81 mg Oral Daily [name], MD   81 mg at 12/22/20 8546  . buPROPion (WELLBUTRIN XL) 24 hr tablet 150 mg  150 mg Oral Daily [name], MD   150 mg at 12/22/20 0739  . carvedilol (COREG) tablet 25 mg  25 mg Oral BID WC [name], MD   25 mg at 12/22/20 0739  . ezetimibe (ZETIA) tablet 10 mg  10 mg Oral Daily [name], MD   10 mg at 12/22/20 0739  . feeding supplement (ENSURE ENLIVE / ENSURE PLUS) liquid 237 mL  237 mL Oral BID BM [name], MD   237 mL at 12/21/20 1113  . gabapentin (NEURONTIN) capsule 400 mg  400 mg Oral TID [name], MD   400 mg at 12/22/20 0739  . lisinopril (ZESTRIL) tablet 5 mg  5 mg Oral Daily [name], [name], MD   5 mg at 12/22/20 0739  . LORazepam (ATIVAN) tablet 1 mg  1 mg Oral Q6H PRN [name], MD   1 mg at 12/21/20 2018  . magnesium hydroxide (MILK OF MAGNESIA) suspension 30 mL  30 mL Oral Daily  PRN [name], MD      . multivitamin with minerals tablet 1 tablet  1 tablet Oral Daily [name], MD   1 tablet at 12/22/20 [phone]  . nitroGLYCERIN (NITROSTAT) SL tablet 0.4 mg  0.4 mg Sublingual Q5 min PRN [name], MD      . olopatadine (PATANOL) 0.1 % ophthalmic solution 1 drop  1 drop Both Eyes Q6H PRN [name], [name], MD      . phenylephrine-shark liver oil-mineral oil-petrolatum (PREPARATION H) rectal ointment 1 application  1 application Rectal BID PRN [name], MD      . polyethylene glycol (MIRALAX / GLYCOLAX) packet 17 g  17 g Oral Daily [name], MD   17 g at 12/22/20 0740  . rosuvastatin (CRESTOR) tablet 40 mg  40 mg Oral Daily [name], MD   40 mg at 12/22/20 0740  . senna-docusate (Senokot-S) tablet 2 tablet  2 tablet Oral Daily [name], MD   2 tablet at 12/22/20 5009   PTA Medications: Medications Prior to Admission  Medication Sig Dispense Refill Last Dose  . aspirin EC 81 MG tablet Take 1 tablet (81 mg total) by mouth daily. 90 tablet 3   . carvedilol (COREG) 25  MG tablet Take 1 tablet (25 mg total) by mouth 2 (two) times daily with a meal. 60 tablet 3   . ELIQUIS 5 MG TABS tablet TAKE ONE TABLET TWICE DAILY 60 tablet 1   . ezetimibe (ZETIA) 10 MG tablet TAKE ONE (1) TABLET EACH DAY 30 tablet 3   . gabapentin (NEURONTIN) 400 MG capsule Take 400 mg by mouth 3 (three) times daily.      . nitroGLYCERIN (NITROSTAT) 0.4 MG SL tablet Place 1 tablet (0.4 mg total) under the tongue every 5 (five) minutes as needed for chest pain. 25 tablet 3   . QC STOOL SOFTENER PLS LAXATIVE 8.6-50 MG tablet Take 2 tablets by mouth daily.     . rosuvastatin (CRESTOR) 40 MG tablet Take 1 tablet (40 mg total) by mouth daily. 90 tablet 3     Patient Stressors: Financial difficulties Health problems  Patient Strengths: Motivation for treatment/growth Supportive family/friends  Treatment Modalities: Medication Management, Group therapy, Case  management,  1 to 1 session with clinician, Psychoeducation, Recreational therapy.   Physician Treatment Plan for Primary Diagnosis: MDD (major depressive disorder), recurrent severe, without psychosis ([place]) Long Term Goal(s): Improvement in symptoms so as ready for discharge Improvement in symptoms so as ready for discharge   Short Term Goals: Ability to identify changes in lifestyle to reduce recurrence of condition will improve Ability to verbalize feelings will improve Ability to disclose and discuss suicidal ideas Ability to demonstrate self-control will improve Ability to identify and develop effective coping behaviors will improve Compliance with prescribed medications will improve Ability to identify changes in lifestyle to reduce recurrence of condition will improve Ability to verbalize feelings will improve Ability to disclose and discuss suicidal ideas Ability to demonstrate self-control will improve Ability to identify and develop effective coping behaviors will improve Compliance with prescribed medications will improve  Medication Management: Evaluate patient's response, side effects, and tolerance of medication regimen.  Therapeutic Interventions: 1 to 1 sessions, Unit Group sessions and Medication administration.  Evaluation of Outcomes: Progressing  Physician Treatment Plan for Secondary Diagnosis: Principal Problem:   MDD (major depressive disorder), recurrent severe, without psychosis ([name]) Active Problems:   Hyperlipidemia   Chronic atrial fibrillation ([facility])   Chronic diastolic congestive heart failure ([facility])   Flatback syndrome of thoracolumbar region   Hypertension   Suicide attempt ([name])  Long Term Goal(s): Improvement in symptoms so as ready for discharge Improvement in symptoms so as ready for discharge   Short Term Goals: Ability to identify changes in lifestyle to reduce recurrence of condition will improve Ability to verbalize feelings will  improve Ability to disclose and discuss suicidal ideas Ability to demonstrate self-control will improve Ability to identify and develop effective coping behaviors will improve Compliance with prescribed medications will improve Ability to identify changes in lifestyle to reduce recurrence of condition will improve Ability to verbalize feelings will improve Ability to disclose and discuss suicidal ideas Ability to demonstrate self-control will improve Ability to identify and develop effective coping behaviors will improve Compliance with prescribed medications will improve     Medication Management: Evaluate patient's response, side effects, and tolerance of medication regimen.  Therapeutic Interventions: 1 to 1 sessions, Unit Group sessions and Medication administration.  Evaluation of Outcomes: Progressing   RN Treatment Plan for Primary Diagnosis: MDD (major depressive disorder), recurrent severe, without psychosis ([place]) Long Term Goal(s): Knowledge of disease and therapeutic regimen to maintain health will improve  Short Term Goals: Ability to demonstrate self-control, Ability to participate  in decision making will improve, Ability to verbalize feelings will improve, Ability to disclose and discuss suicidal ideas, Ability to identify and develop effective coping behaviors will improve and Compliance with prescribed medications will improve  Medication Management: RN will administer medications as ordered by provider, will assess and evaluate patient's response and provide education to patient for prescribed medication. RN will report any adverse and/or side effects to prescribing provider.  Therapeutic Interventions: 1 on 1 counseling sessions, Psychoeducation, Medication administration, Evaluate responses to treatment, Monitor vital signs and CBGs as ordered, Perform/monitor CIWA, COWS, AIMS and Fall Risk screenings as ordered, Perform wound care treatments as ordered.  Evaluation of  Outcomes: Progressing   LCSW Treatment Plan for Primary Diagnosis: MDD (major depressive disorder), recurrent severe, without psychosis ([place]) Long Term Goal(s): Safe transition to appropriate next level of care at discharge, Engage patient in therapeutic group addressing interpersonal concerns.  Short Term Goals: Engage patient in aftercare planning with referrals and resources, Increase social support, Increase ability to appropriately verbalize feelings, Increase emotional regulation, Facilitate acceptance of mental health diagnosis and concerns and Increase skills for wellness and recovery  Therapeutic Interventions: Assess for all discharge needs, 1 to 1 time with Social worker, Explore available resources and support systems, Assess for adequacy in community support network, Educate family and significant other(s) on suicide prevention, Complete Psychosocial Assessment, Interpersonal group therapy.  Evaluation of Outcomes: Progressing   Progress in Treatment: Attending groups: No. Participating in groups: No. Taking medication as prescribed: Yes. Toleration medication: Yes. Family/Significant other contact made: Yes, individual(s) contacted:  SPE completed with patient and wife. Patient understands diagnosis: Yes. Discussing patient identified problems/goals with staff: No. Medical problems stabilized or resolved: Yes. Denies suicidal/homicidal ideation: Yes. Issues/concerns per patient self-inventory: No. Other: none  New problem(s) identified: No, Describe:  none  New Short Term/Long Term Goal(s):  medication management for mood stabilization; elimination of SI thoughts; development of comprehensive mental wellness/sobriety plan.  Update 12/22/2020:  No changes at this time.   Patient Goals:  Patient given the opportunity to attend treatment team, however declined.  Update 12/22/2020:  No changes at this time.   Discharge Plan or Barriers: CSW will assist the patient in  developing an appropriate discharge plan.  Update 12/22/2020:  CSW team has made referrals as indicated in PT note.  CSW hgas reached out to [facility] for aftercare plans.    Reason for Continuation of Hospitalization: Anxiety Depression Medical Issues Medication stabilization Suicidal ideation  Estimated Length of Stay:  1-7 days  Recreational Therapy: Patient Stressors: N/A Patient Goal: Patient will engage in groups without prompting or encouragement from LRT x3 group sessions within 5 recreation therapy group sessions.  Attendees: Patient:  12/22/2020 9:30 AM  Physician: Dr. Domingo Cocking, MD  12/22/2020 9:30 AM  Nursing:  12/22/2020 9:30 AM  RN Care Manager: 12/22/2020 9:30 AM  Social Worker: Assunta Curtis, MSW, LCSW 12/22/2020 9:30 AM  Recreational Therapist:  12/22/2020 9:30 AM  Other: Gwynneth Munson" [place], [place] 12/22/2020 9:30 AM  Other:  12/22/2020 9:30 AM  Other: 12/22/2020 9:30 AM    Scribe for Treatment Team: Rozann Lesches, LCSW 12/22/2020 9:30 AM

## 2020-12-22 NOTE — Progress Notes (Signed)
Patient calm and compliant during assessment denying HI/AVH. Patient endorses anxiety and depression. Patient endorses passive SI, verbally contracts for safety. Patient observed interacting appropriately with staff and peers on the unit. Patient didn't have any night medications scheduled and didn't request anything PRN. Patient given education, support, and encouragement to be active in his treatment plan. Patient being monitored Q 15 minutes for safety per unit protocol. Pt remains safe on the unit. 

## 2020-12-22 NOTE — Plan of Care (Signed)
Patient presents at his baseline   Problem: Education: Goal: Knowledge of the prescribed therapeutic regimen will improve Outcome: Progressing

## 2020-12-22 NOTE — BHH Counselor (Signed)
CSW met with patient to discuss PHP referral.  CSW provided psycho-education on what a PHP program consisted of and what the potential schedule may look like.  Patient did express concerns that he is not very Economist.  CSW reviewed the basics of the programs that would likely be used.  CSW explained, at pt's request that recommendation came from insurance as insurance has stopped covering days.  CSW explained that pt could/would remain hospitalized until psychiatrically cleared by physician.  CSW explained that the provider that the patient initially wanted to follow up with is no longer accepting new patient so pt would need to identify a new plan with CSW assistance.    CSW did note that during the conversation the pt placed his hand on his chest and leaned back stating that "Do they know that I have had a bypass recently?"  CSW questioned if pt was in pain and need medical attention and pt denied.  Assunta Curtis, MSW, LCSW 12/22/2020 3:52 PM

## 2020-12-22 NOTE — Progress Notes (Signed)
Physical Therapy Treatment Patient Details Name: Jeremy Reese MRN: [id number] DOB: 02/10/1951 Today's Date: 12/22/2020    History of Present Illness 70 yo male with onset of severe depression and taking meds in large quantity was admitted to [facility], after not having slept in several days.  Pt has been overwhelmed since dc from prev psych admission, with suicidal thoughts and low energy.  Referred to PT to assess mobility.  PMHx: CHF, HLD, a-fib, CABG, CAD    PT Comments    Pt was pleasant and motivated to participate during the session.  Pt continued to require min extra time and effort with bed mobility and transfers but was generally steady with no need of physical assistance.  Pt demonstrated fair carryover of proper sequencing during ambulation with a RW and was able to amb increased distance this session without LOB.  Pt will benefit from HHPT services upon discharge to safely address deficits listed in patient problem list for decreased caregiver assistance and eventual return to PLOF.     Follow Up Recommendations  Home health PT;Supervision for mobility/OOB     Equipment Recommendations  Rolling walker with 5" wheels    Recommendations for Other Services       Precautions / Restrictions Precautions Precautions: Fall Precaution Comments: back pain chronically Restrictions Weight Bearing Restrictions: No    Mobility  Bed Mobility Overal bed mobility: Modified Independent Bed Mobility: Supine to Sit;Sit to Supine           General bed mobility comments: Extra time and effort; log roll sequencing reinforced but pt stated is unable to perform log roll on his bed in [facility] secondary to raised edges of mattress  Transfers Overall transfer level: Needs assistance Equipment used: Rolling walker (2 wheeled) Transfers: Sit to/from Stand Sit to Stand: Supervision         General transfer comment: Min verbal cues for sequencing with pt able to stand without assist with  extra time and effort  Ambulation/Gait Ambulation/Gait assistance: Supervision Gait Distance (Feet): 200 Feet Assistive device: Rolling walker (2 wheeled) Gait Pattern/deviations: Step-through pattern;Decreased stride length;Trunk flexed Gait velocity: reduced   General Gait Details: Min verbal cues for amb closer to the RW and for general safety   Stairs             Wheelchair Mobility    Modified Rankin (Stroke Patients Only)       Balance Overall balance assessment: Needs assistance Sitting-balance support: Feet supported Sitting balance-Leahy Scale: Normal     Standing balance support: Bilateral upper extremity supported;During functional activity Standing balance-Leahy Scale: Good Standing balance comment: Min lean on the RW for support                            Cognition Arousal/Alertness: Awake/alert Behavior During Therapy: WFL for tasks assessed/performed Overall Cognitive Status: Within Functional Limits for tasks assessed                                        Exercises Other Exercises Other Exercises: HEP review Other Exercises: Log roll training reinforcement verbally    General Comments        Pertinent Vitals/Pain Pain Assessment: 0-10 Pain Score: 8  Pain Location: low back Pain Descriptors / Indicators: Sore;Aching Pain Intervention(s): Premedicated before session;Monitored during session    Home Living  Prior Function            PT Goals (current goals can now be found in the care plan section) Progress towards PT goals: Progressing toward goals    Frequency    Min 2X/week      PT Plan Current plan remains appropriate    Co-evaluation              AM-PAC PT "6 Clicks" Mobility   Outcome Measure  Help needed turning from your back to your side while in a flat bed without using bedrails?: None Help needed moving from lying on your back to sitting on the  side of a flat bed without using bedrails?: A Little Help needed moving to and from a bed to a chair (including a wheelchair)?: A Little Help needed standing up from a chair using your arms (e.g., wheelchair or bedside chair)?: A Little Help needed to walk in hospital room?: A Little Help needed climbing 3-5 steps with a railing? : A Little 6 Click Score: 19    End of Session   Activity Tolerance: Patient tolerated treatment well Patient left: in bed Nurse Communication: Mobility status PT Visit Diagnosis: Unsteadiness on feet (R26.81);Muscle weakness (generalized) (M62.81)     Time: 1191-4782 PT Time Calculation (min) (ACUTE ONLY): 15 min  Charges:  $Gait Training: 8-22 mins                     D. Scott Bernarr [place] PT, DPT 12/22/20, 11:04 AM

## 2020-12-22 NOTE — Progress Notes (Signed)
Recreation Therapy Notes  Date: 12/22/2020  Time: 9:30 am   Location: Craft room   Behavioral response: Appropriate  Intervention Topic: Goals   Discussion/Intervention:  Group content on today was focused on goals. Patients described what goals are and how they define goals. Individuals expressed how they go about setting goals and reaching them. The group identified how important goals are and if they make short term goals to reach long term goals. Patients described how many goals they work on at a time and what affects them not reaching their goal. Individuals described how much time they put into planning and obtaining their goals. The group participated in the intervention "My Goal Board" and made personal goal boards to help them achieve their goal. Clinical Observations/Feedback: Patient came to group and identified his goal as feeling better and getting out of here. He stated that to stay out of the hospital he could get out more and work with other people more. Participant explained that he normally enjoys cutting firewood and fishing. Patient expressed that flying down to [place] for two months and fishing would do him some good right now. Individual was social with staff while participating in the intervention. Vanessa Alesi LRT/CTRS         Rainy Rothman 12/22/2020 11:19 AM

## 2020-12-23 MED ORDER — GABAPENTIN 300 MG PO CAPS
600.0000 mg | ORAL_CAPSULE | Freq: Three times a day (TID) | ORAL | Status: DC
Start: 2020-12-23 — End: 2020-12-29
  Administered 2020-12-23 – 2020-12-29 (×18): 600 mg via ORAL
  Filled 2020-12-23 (×3): qty 2
  Filled 2020-12-23: qty 6
  Filled 2020-12-23 (×14): qty 2

## 2020-12-23 NOTE — Progress Notes (Signed)
[facility] MD Progress Note  12/23/2020 2:20 PM Jeremy Reese  MRN:  [id number]   Subjective:  70 year old male with history of MDD and GAD presenting after intentional ingestion of Prozac and Remeron resulting in serotonin syndrome and medical admission. Patient had no acute events overnight, medication compliant, attending to ADLs.  Patient seen one-on-one at bedside today. He feels his anxiety and depression are still quite bad. Today he discusses that he wished his suicide attempt was successful. He continues to desire to take a handful of pills to go to sleep and never wake up. He continues to feel that there is little hope or purpose in living. He does not feel safe to go home, and is unsure what will help him feel safe. He is open to team trying to find partial hospitalization or ACT team services.   Principal Problem: MDD (major depressive disorder), recurrent severe, without psychosis ([place]) Diagnosis: Principal Problem:   MDD (major depressive disorder), recurrent severe, without psychosis ([place]) Active Problems:   Hyperlipidemia   Chronic atrial fibrillation (HCC)   Chronic diastolic congestive heart failure (HCC)   Flatback syndrome of thoracolumbar region   Hypertension   Suicide attempt ([place])  Total Time spent with patient: 30 minutes  Past Psychiatric History: Patient has a past history of depression. In 1992 he had a suicide attempt by overdose. Had a hospitalization at that time. Saw Dr. Thurmond Butts for years. Much of the time diagnosed with seasonal affective disorder and took fluoxetine and used a light box in the winter. Between that time and now had not had any other major depressive episodes. No history of mania.  Past Medical History:  Past Medical History:  Diagnosis Date  . Atrial flutter, paroxysmal ([place])   . Carotid artery disease without cerebral infarction ([place])   . Coronary artery disease 2011  . Flatback syndrome of thoracolumbar region   . H/O calcium  pyrophosphate deposition disease (CPPD)   . History of TIA (transient ischemic attack)   . Hyperlipidemia   . Paroxysmal A-fib ([place])   . Spinal stenosis of lumbar region without neurogenic claudication     Past Surgical History:  Procedure Laterality Date  . BILATERAL CARPAL TUNNEL RELEASE    . CARDIAC CATHETERIZATION    . CIRCUMCISION, NON-NEWBORN    . CLIPPING OF ATRIAL APPENDAGE N/A 09/18/2020   Procedure: CLIPPING OF ATRIAL APPENDAGE USING 92 ATRICURE LAA  EXCLUSION SYSTEM;  Surgeon: Ivin Poot, MD;  Location: [place];  Service: Open Heart Surgery;  Laterality: N/A;  . CORONARY ANGIOPLASTY WITH STENT PLACEMENT  2011  . CORONARY ARTERY BYPASS GRAFT N/A 09/18/2020   Procedure: CORONARY ARTERY BYPASS GRAFTING (CABG)X 3, ON PUMP, USING LEFT INTERAL MAMMARY ARTERY AND ENDOSCOPICALLY HARVESTED RIGHT GREATER SAPHENOUS VEIN. LIMA TO LAD, SVG TO OM, SVG TO PD;  Surgeon: Ivin Poot, MD;  Location: [place];  Service: Open Heart Surgery;  Laterality: N/A;  . ENDOVEIN HARVEST OF GREATER SAPHENOUS VEIN Right 09/18/2020   Procedure: ENDOVEIN HARVEST OF GREATER SAPHENOUS VEIN;  Surgeon: Ivin Poot, MD;  Location: [place];  Service: Open Heart Surgery;  Laterality: Right;  . LEFT HEART CATH AND CORONARY ANGIOGRAPHY Left 09/12/2020   Procedure: LEFT HEART CATH AND CORONARY ANGIOGRAPHY;  Surgeon: Minna Merritts, MD;  Location: [place] [facility];  Service: Cardiovascular;  Laterality: Left;  . TEE WITHOUT CARDIOVERSION N/A 09/18/2020   Procedure: TRANSESOPHAGEAL ECHOCARDIOGRAM (TEE);  Surgeon: Prescott Gum, Collier Salina, MD;  Location: [place];  Service: Open Heart  Surgery;  Laterality: N/A;   Family History:  Family History  Problem Relation Age of Onset  . Heart disease Mother 59       CABG x 3   . Hypertension Father    Family Psychiatric  History: Denies Social History:  Social History   Substance and Sexual Activity  Alcohol Use Yes   Comment: rare      Social History   Substance  and Sexual Activity  Drug Use No    Social History   Socioeconomic History  . Marital status: Married    Spouse name: Not on file  . Number of children: Not on file  . Years of education: Not on file  . Highest education level: Not on file  Occupational History  . Not on file  Tobacco Use  . Smoking status: Former Smoker    Packs/day: 0.25    Years: 35.00    Pack years: 8.75    Types: Cigarettes    Quit date: 11/29/1993    Years since quitting: 27.0  . Smokeless tobacco: Never Used  Vaping Use  . Vaping Use: Never used  Substance and Sexual Activity  . Alcohol use: Yes    Comment: rare   . Drug use: No  . Sexual activity: Not on file  Other Topics Concern  . Not on file  Social History Narrative  . Not on file   Social Determinants of Health   Financial Resource Strain: Not on file  Food Insecurity: Not on file  Transportation Needs: Not on file  Physical Activity: Not on file  Stress: Not on file  Social Connections: Not on file   Additional Social History:                         Sleep: Fair  Appetite:  Fair  Current Medications: Current Facility-Administered Medications  Medication Dose Route Frequency Provider Last Rate Last Admin  . acetaminophen (TYLENOL) tablet 650 mg  650 mg Oral Q6H PRN [name], MD   650 mg at 12/21/20 2019  . alum & mag hydroxide-simeth (MAALOX/MYLANTA) 200-200-20 MG/5ML suspension 30 mL  30 mL Oral Q4H PRN [name], MD      . apixaban [name]) tablet 5 mg  5 mg Oral BID [name], MD   5 mg at 12/23/20 0825  . aspirin EC tablet 81 mg  81 mg Oral Daily [name], MD   81 mg at 12/23/20 1610  . buPROPion (WELLBUTRIN XL) 24 hr tablet 150 mg  150 mg Oral Daily [name], MD   150 mg at 12/23/20 9604  . carvedilol (COREG) tablet 25 mg  25 mg Oral BID WC [name], MD   25 mg at 12/23/20 5409  . ezetimibe (ZETIA) tablet 10 mg  10 mg Oral Daily [name], MD   10 mg at 12/23/20  0825  . feeding supplement (ENSURE ENLIVE / ENSURE PLUS) liquid 237 mL  237 mL Oral BID BM [name], MD   237 mL at 12/22/20 1446  . gabapentin (NEURONTIN) capsule 600 mg  600 mg Oral TID [name], MD   600 mg at 12/23/20 1251  . lisinopril (ZESTRIL) tablet 5 mg  5 mg Oral Daily [name], [name], MD   5 mg at 12/23/20 0824  . LORazepam (ATIVAN) tablet 1 mg  1 mg Oral Q6H PRN [name], MD   1 mg  at 12/22/20 1325  . magnesium hydroxide (MILK OF MAGNESIA) suspension 30 mL  30 mL Oral Daily PRN Jesse Sans, MD      . multivitamin with minerals tablet 1 tablet  1 tablet Oral Daily Jesse Sans, MD   1 tablet at 12/23/20 0272  . nitroGLYCERIN (NITROSTAT) SL tablet 0.4 mg  0.4 mg Sublingual Q5 min PRN Jesse Sans, MD      . olopatadine (PATANOL) 0.1 % ophthalmic solution 1 drop  1 drop Both Eyes Q6H PRN [name], Jackquline Denmark, MD      . phenylephrine-shark liver oil-mineral oil-petrolatum (PREPARATION H) rectal ointment 1 application  1 application Rectal BID PRN Jesse Sans, MD   1 application at 12/22/20 1316  . polyethylene glycol (MIRALAX / GLYCOLAX) packet 17 g  17 g Oral Daily Jesse Sans, MD   17 g at 12/22/20 0740  . rosuvastatin (CRESTOR) tablet 40 mg  40 mg Oral Daily Jesse Sans, MD   40 mg at 12/23/20 0825  . senna-docusate (Senokot-S) tablet 2 tablet  2 tablet Oral Daily Jesse Sans, MD   2 tablet at 12/23/20 5366    Lab Results:  No results found for this or any previous visit (from the past 48 hour(s)).  Blood Alcohol level:  Lab Results  Component Value Date   ETH 16 (H) 12/11/2020   ETH <10 11/10/2020    Metabolic Disorder Labs: Lab Results  Component Value Date   HGBA1C 6.4 (H) 09/16/2020   MPG 137 09/16/2020   No results found for: PROLACTIN Lab Results  Component Value Date   CHOL 108 09/05/2020   TRIG 76 09/05/2020   HDL 44 09/05/2020   CHOLHDL 2.5 09/05/2020   LDLCALC 48 09/05/2020   LDLCALC 95 04/02/2015     Physical Findings: AIMS: Facial and Oral Movements Muscles of Facial Expression: None, normal Lips and Perioral Area: None, normal Jaw: None, normal Tongue: None, normal,Extremity Movements Upper (arms, wrists, hands, fingers): None, normal Lower (legs, knees, ankles, toes): None, normal, Trunk Movements Neck, shoulders, hips: None, normal, Overall Severity Severity of abnormal movements (highest score from questions above): None, normal Incapacitation due to abnormal movements: None, normal Patient's awareness of abnormal movements (rate only patient's report): No Awareness, Dental Status Current problems with teeth and/or dentures?: No Does patient usually wear dentures?: No  CIWA:    COWS:     Musculoskeletal: Strength & Muscle Tone: decreased Gait & Station: ambulated with walker Patient leans: Front  Psychiatric Specialty Exam: Physical Exam Vitals and nursing note reviewed.  Constitutional:      Appearance: Normal appearance.  HENT:     Head: Normocephalic and atraumatic.     Right Ear: External ear normal.     Left Ear: External ear normal.     Nose: Nose normal.     Mouth/Throat:     Mouth: Mucous membranes are moist.     Pharynx: Oropharynx is clear.  Eyes:     Extraocular Movements: Extraocular movements intact.     Conjunctiva/sclera: Conjunctivae normal.     Pupils: Pupils are equal, round, and reactive to light.  Cardiovascular:     Rate and Rhythm: Normal rate and regular rhythm.     Pulses: Normal pulses.  Pulmonary:     Effort: Pulmonary effort is normal.  Abdominal:     General: Abdomen is flat.     Palpations: Abdomen is soft.  Musculoskeletal:        General: Deformity  present. No swelling.     Cervical back: Normal range of motion and neck supple.  Skin:    General: Skin is warm and dry.  Neurological:     General: No focal deficit present.     Mental Status: He is alert and oriented to person, place, and time.  Psychiatric:         Attention and Perception: Attention and perception normal.        Mood and Affect: Mood is depressed. Affect is flat.        Speech: Speech normal.        Behavior: Behavior is withdrawn.        Thought Content: Thought content includes suicidal ideation. Thought content does not include suicidal plan.        Cognition and Memory: Cognition and memory normal.        Judgment: Judgment normal.     Review of Systems  Constitutional: Positive for fatigue. Negative for appetite change.  HENT: Negative for rhinorrhea and sore throat.   Eyes: Negative for photophobia and visual disturbance.  Respiratory: Negative for cough and shortness of breath.   Cardiovascular: Negative for chest pain and palpitations.  Gastrointestinal: Positive for constipation. Negative for diarrhea, nausea and vomiting.  Endocrine: Negative for cold intolerance and heat intolerance.  Genitourinary: Negative for difficulty urinating and dysuria.  Musculoskeletal: Positive for arthralgias, back pain and myalgias.  Skin: Negative for rash and wound.  Allergic/Immunologic: Negative for food allergies and immunocompromised state.  Neurological: Positive for weakness. Negative for dizziness and headaches.  Psychiatric/Behavioral: Positive for dysphoric mood. Negative for suicidal ideas. The patient is nervous/anxious.     Blood pressure (!) 165/93, pulse 67, temperature 97.7 F (36.5 C), temperature source Oral, resp. rate 18, height 5\' 11"  (1.803 m), weight 98 kg, SpO2 97 %.Body mass index is 30.13 kg/m.  General Appearance: Casual  Eye Contact:  Fair  Speech:  Clear and Coherent  Volume:  Decreased  Mood:  Depressed and Dysphoric  Affect:  Congruent  Thought Process:  Coherent and Linear  Orientation:  Full (Time, Place, and Person)  Thought Content:  Logical  Suicidal Thoughts:  Yes.  without intent/plan  Homicidal Thoughts:  No  Memory:  Immediate;   Fair Recent;   Fair Remote;   Fair  Judgement:  Intact   Insight:  Shallow  Psychomotor Activity:  Decreased  Concentration:  Concentration: Fair  Recall:  [employer] of Knowledge:  Fair  Language:  Fair  Akathisia:  Negative  Handed:  Right  AIMS (if indicated):     Assets:  Communication Skills Desire for Improvement Financial Resources/Insurance Housing Intimacy  ADL's:  Intact  Cognition:  WNL  Sleep:  Number of Hours: 9     Treatment Plan Summary: Daily contact with patient to assess and evaluate symptoms and progress in treatment and Medication management PLAN OF CARE:70 year old male with history of MDD and GAD presenting after intentional ingestion of Prozac and Remeron resulting in serotonin syndrome and medical admission. At this time patient continues to endorse depressed mood, poor sleep, low energy, anhedonia, hopelessness, helplessness, worthlessness, and passive suicidal ideations. Continue Buproprion XL 150 mg daily for depression, increase gabapentin to 600 mg TID for pain and anxiety. Ativan PRN for anxiety.PT consult completed, and appreciate recommendations. Home Health PT order completed.   [name], MD 12/23/2020, 2:20 PM

## 2020-12-23 NOTE — [facility] Group Notes (Signed)
LCSW Group Therapy Note  12/23/2020 2:02 PM  Type of Therapy/Topic:  Group Therapy:  Feelings about Diagnosis  Participation Level:  Did Not Attend   Description of Group:   This group will allow patients to explore their thoughts and feelings about diagnoses they have received. Patients will be guided to explore their level of understanding and acceptance of these diagnoses. Facilitator will encourage patients to process their thoughts and feelings about the reactions of others to their diagnosis and will guide patients in identifying ways to discuss their diagnosis with significant others in their lives. This group will be process-oriented, with patients participating in exploration of their own experiences, giving and receiving support, and processing challenge from other group members.   Therapeutic Goals: 1. Patient will demonstrate understanding of diagnosis as evidenced by identifying two or more symptoms of the disorder 2. Patient will be able to express two feelings regarding the diagnosis 3. Patient will demonstrate their ability to communicate their needs through discussion and/or role play  Summary of Patient Progress: X  Therapeutic Modalities:   Cognitive Behavioral Therapy Brief Therapy Feelings Identification   Litzi Binning R. Guerry Bruin, MSW, LCSW, [place] 12/23/2020 2:02 PM

## 2020-12-23 NOTE — Plan of Care (Signed)
Pt rates depression 4/10, anxiety 8/10 and hopelessness at 10/10. Pt was educated on care plan and verbalizes understanding. Collier Bullock RN Problem: Education: Goal: Utilization of techniques to improve thought processes will improve 12/23/2020 1809 by Kieth Brightly, RN Outcome: Progressing 12/23/2020 1034 by Kieth Brightly, RN Outcome: Progressing Goal: Knowledge of the prescribed therapeutic regimen will improve 12/23/2020 1809 by Kieth Brightly, RN Outcome: Progressing 12/23/2020 1034 by Kieth Brightly, RN Outcome: Progressing   Problem: Activity: Goal: Interest or engagement in leisure activities will improve 12/23/2020 1809 by Kieth Brightly, RN Outcome: Progressing 12/23/2020 1034 by Kieth Brightly, RN Outcome: Not Progressing Goal: Imbalance in normal sleep/wake cycle will improve 12/23/2020 1809 by Kieth Brightly, RN Outcome: Progressing 12/23/2020 1034 by Kieth Brightly, RN Outcome: Not Progressing   Problem: Coping: Goal: Coping ability will improve 12/23/2020 1809 by Kieth Brightly, RN Outcome: Progressing 12/23/2020 1034 by Kieth Brightly, RN Outcome: Progressing Goal: Will verbalize feelings 12/23/2020 1809 by Kieth Brightly, RN Outcome: Progressing 12/23/2020 1034 by Kieth Brightly, RN Outcome: Progressing   Problem: Health Behavior/Discharge Planning: Goal: Ability to make decisions will improve 12/23/2020 1809 by Kieth Brightly, RN Outcome: Progressing 12/23/2020 1034 by Kieth Brightly, RN Outcome: Progressing Goal: Compliance with therapeutic regimen will improve 12/23/2020 1809 by Kieth Brightly, RN Outcome: Progressing 12/23/2020 1034 by Kieth Brightly, RN Outcome: Progressing   Problem: Role Relationship: Goal: Will demonstrate positive changes in social behaviors and relationships 12/23/2020 1809 by Kieth Brightly, RN Outcome: Progressing 12/23/2020 1034 by Kieth Brightly, RN Outcome: Progressing   Problem: Safety: Goal: Ability to  disclose and discuss suicidal ideas will improve 12/23/2020 1809 by Kieth Brightly, RN Outcome: Progressing 12/23/2020 1034 by Kieth Brightly, RN Outcome: Progressing Goal: Ability to identify and utilize support systems that promote safety will improve 12/23/2020 1809 by Kieth Brightly, RN Outcome: Progressing 12/23/2020 1034 by Kieth Brightly, RN Outcome: Progressing   Problem: Self-Concept: Goal: Will verbalize positive feelings about self 12/23/2020 1809 by Kieth Brightly, RN Outcome: Progressing 12/23/2020 1034 by Kieth Brightly, RN Outcome: Progressing Goal: Level of anxiety will decrease 12/23/2020 1809 by Kieth Brightly, RN Outcome: Progressing 12/23/2020 1034 by Kieth Brightly, RN Outcome: Progressing   Problem: Education: Goal: Knowledge of General Education information will improve Description: Including pain rating scale, medication(s)/side effects and non-pharmacologic comfort measures 12/23/2020 1809 by Kieth Brightly, RN Outcome: Progressing 12/23/2020 1034 by Kieth Brightly, RN Outcome: Progressing   Problem: Health Behavior/Discharge Planning: Goal: Ability to manage health-related needs will improve 12/23/2020 1809 by Kieth Brightly, RN Outcome: Progressing 12/23/2020 1034 by Kieth Brightly, RN Outcome: Progressing   Problem: Clinical Measurements: Goal: Ability to maintain clinical measurements within normal limits will improve 12/23/2020 1809 by Kieth Brightly, RN Outcome: Progressing 12/23/2020 1034 by Kieth Brightly, RN Outcome: Progressing Goal: Will remain free from infection 12/23/2020 1809 by Kieth Brightly, RN Outcome: Progressing 12/23/2020 1034 by Kieth Brightly, RN Outcome: Progressing Goal: Diagnostic test results will improve 12/23/2020 1809 by Kieth Brightly, RN Outcome: Progressing 12/23/2020 1034 by Kieth Brightly, RN Outcome: Progressing Goal: Respiratory complications will improve 12/23/2020 1809 by Kieth Brightly,  RN Outcome: Progressing 12/23/2020 1034 by Kieth Brightly, RN Outcome: Progressing Goal: Cardiovascular complication will be avoided 12/23/2020 1809 by Kieth Brightly, RN Outcome: Progressing 12/23/2020 1034 by Kieth Brightly, RN Outcome: Progressing   Problem: Activity:  Goal: Risk for activity intolerance will decrease 12/23/2020 1809 by Kieth Brightly, RN Outcome: Progressing 12/23/2020 1034 by Kieth Brightly, RN Outcome: Progressing   Problem: Nutrition: Goal: Adequate nutrition will be maintained 12/23/2020 1809 by Kieth Brightly, RN Outcome: Progressing 12/23/2020 1034 by Kieth Brightly, RN Outcome: Progressing   Problem: Coping: Goal: Level of anxiety will decrease 12/23/2020 1809 by Kieth Brightly, RN Outcome: Progressing 12/23/2020 1034 by Kieth Brightly, RN Outcome: Progressing   Problem: Elimination: Goal: Will not experience complications related to bowel motility 12/23/2020 1809 by Kieth Brightly, RN Outcome: Progressing 12/23/2020 1034 by Kieth Brightly, RN Outcome: Progressing Goal: Will not experience complications related to urinary retention 12/23/2020 1809 by Kieth Brightly, RN Outcome: Progressing 12/23/2020 1034 by Kieth Brightly, RN Outcome: Progressing   Problem: Pain Managment: Goal: General experience of comfort will improve 12/23/2020 1809 by Kieth Brightly, RN Outcome: Progressing 12/23/2020 1034 by Kieth Brightly, RN Outcome: Progressing   Problem: Safety: Goal: Ability to remain free from injury will improve 12/23/2020 1809 by Kieth Brightly, RN Outcome: Progressing 12/23/2020 1034 by Kieth Brightly, RN Outcome: Progressing   Problem: Skin Integrity: Goal: Risk for impaired skin integrity will decrease 12/23/2020 1809 by Kieth Brightly, RN Outcome: Progressing 12/23/2020 1034 by Kieth Brightly, RN Outcome: Progressing

## 2020-12-23 NOTE — Progress Notes (Signed)
Patient calm and compliant during assessment denying HI/AVH. Patient endorses anxiety and depression. Patient endorses passive SI, verbally contracts for safety. Patient observed interacting appropriately with staff and peers on the unit. Patient didn't have any night medications scheduled and didn't request anything PRN. Patient given education, support, and encouragement to be active in his treatment plan. Patient being monitored Q 15 minutes for safety per unit protocol. Pt remains safe on the unit. 

## 2020-12-23 NOTE — Progress Notes (Signed)
Recreation Therapy Notes   Date: 12/23/2020  Time: 9:30 am   Location: Craft room   Behavioral response: Appropriate  Intervention Topic: Stress    Discussion/Intervention:  Group content on today was focused on stress. The group defined stress and way to cope with stress. Participants expressed how they know when they are stresses out. Individuals described the different ways they have to cope with stress. The group stated reasons why it is important to cope with stress. Patient explained what good stress is and some examples. The group participated in the intervention "Stress Management Jeopardy". Individuals were separated into two group and answered questions related to stress.  Clinical Observations/Feedback: Patient came to group and defined stress as being afraid of something. He stated that he normally deals with stress by just laughing it off and having a positive attitude. Participant explained that he can improve his stress management skills by deep breathing, not doing stupid stuff and avoiding dangerous situations. Patient expressed that stress management is important to save your life. Individual was social with staff while participating in the intervention. Adianna Darwin LRT/CTRS         Raef Sprigg 12/23/2020 11:48 AM

## 2020-12-23 NOTE — Plan of Care (Signed)
Patient compliant with medication administration per MD orders and procedures on the unit   Problem: Health Behavior/Discharge Planning: Goal: Compliance with therapeutic regimen will improve Outcome: Progressing

## 2020-12-24 NOTE — Progress Notes (Signed)
Recreation Therapy Notes   Date: 12/24/2020  Time: 9:30 am   Location: Craft room     Behavioral response: N/A   Intervention Topic: Wellness   Discussion/Intervention: Patient did not attend group.   Clinical Observations/Feedback:  Patient did not attend group.   Samyiah Halvorsen LRT/CTRS        Danaly Bari 12/24/2020 10:54 AM

## 2020-12-24 NOTE — Progress Notes (Signed)
Physical Therapy Treatment Patient Details Name: Jeremy Reese MRN: [id number] DOB: February 01, 1951 Today's Date: 12/24/2020    History of Present Illness 70 yo male with onset of severe depression and taking meds in large quantity was admitted to [facility], after not having slept in several days.  Pt has been overwhelmed since dc from prev psych admission, with suicidal thoughts and low energy.  Referred to PT to assess mobility.  PMHx: CHF, HLD, a-fib, CABG, CAD    PT Comments    Pt was pleasant and motivated to participate during the session.  Pt demonstrated good carryover of proper sequencing during transfers along with good eccentric and concentric control and stability.  Pt steady with amb with time spent working on finding pt's most upright position that did not cause back pain.  Pt put forth very good effort during below balance training/therex and presented with grossly decreased sway with activities as the session progressed.  Pt will benefit from HHPT services upon discharge to safely address deficits listed in patient problem list for decreased caregiver assistance and eventual return to PLOF.     Follow Up Recommendations  Home health PT;Supervision for mobility/OOB     Equipment Recommendations  Rolling walker with 5" wheels    Recommendations for Other Services       Precautions / Restrictions Precautions Precautions: Fall Precaution Comments: back pain chronically Restrictions Weight Bearing Restrictions: No    Mobility  Bed Mobility               General bed mobility comments: NT, pt in sitting  Transfers Overall transfer level: Modified independent Equipment used: Rolling walker (2 wheeled) Transfers: Sit to/from Stand Sit to Stand: Modified independent (Device/Increase time)         General transfer comment: No cues for sequencing needed with pt demonstrating good eccentric and concentric control  Ambulation/Gait Ambulation/Gait assistance:  Supervision Gait Distance (Feet): 300 Feet Assistive device: Rolling walker (2 wheeled) Gait Pattern/deviations: Step-through pattern;Decreased stride length;Trunk flexed Gait velocity: reduced   General Gait Details: Min verbal cues for amb closer to the RW and for general safety   Stairs             Wheelchair Mobility    Modified Rankin (Stroke Patients Only)       Balance Overall balance assessment: Needs assistance Sitting-balance support: Feet supported Sitting balance-Leahy Scale: Normal     Standing balance support: No upper extremity supported;Bilateral upper extremity supported;During functional activity Standing balance-Leahy Scale: Good Standing balance comment: Pt steady with amb with a RW and demonstrated good stability during static and dynamic standing balance training without UE support                            Cognition Arousal/Alertness: Awake/alert Behavior During Therapy: WFL for tasks assessed/performed Overall Cognitive Status: Within Functional Limits for tasks assessed                                        Exercises Other Exercises Other Exercises: Static standing balance training with various foot positions and combinations of eyes open/closed and head still/head turns Other Exercises: Dynamic standing balance training with feet apart, together, and semi-tandem with reaching activities outside BOS    General Comments        Pertinent Vitals/Pain Pain Assessment: No/denies pain    Home Living  Prior Function            PT Goals (current goals can now be found in the care plan section) Progress towards PT goals: Progressing toward goals    Frequency    Min 2X/week      PT Plan Current plan remains appropriate    Co-evaluation              AM-PAC PT "6 Clicks" Mobility   Outcome Measure  Help needed turning from your back to your side while in a flat  bed without using bedrails?: None Help needed moving from lying on your back to sitting on the side of a flat bed without using bedrails?: A Little Help needed moving to and from a bed to a chair (including a wheelchair)?: A Little Help needed standing up from a chair using your arms (e.g., wheelchair or bedside chair)?: None Help needed to walk in hospital room?: A Little Help needed climbing 3-5 steps with a railing? : A Little 6 Click Score: 20    End of Session Equipment Utilized During Treatment: Gait belt Activity Tolerance: Patient tolerated treatment well Patient left: Other (comment) (Pt left at nursing station) Nurse Communication: Mobility status PT Visit Diagnosis: Unsteadiness on feet (R26.81);Muscle weakness (generalized) (M62.81)     Time: 2751-7001 PT Time Calculation (min) (ACUTE ONLY): 23 min  Charges:  $Gait Training: 8-22 mins $Therapeutic Exercise: 8-22 mins                     D. Scott Champagne Paletta PT, DPT 12/24/20, 5:30 PM

## 2020-12-24 NOTE — Plan of Care (Signed)
Patient compliant with medications and procedures on the unit   Problem: Health Behavior/Discharge Planning: Goal: Compliance with therapeutic regimen will improve Outcome: Progressing

## 2020-12-24 NOTE — BHH Group Notes (Signed)
LCSW Group Therapy Note  12/24/2020 2:03 PM  Type of Therapy/Topic:  Group Therapy:  Emotion Regulation  Participation Level:  Did Not Attend   Description of Group:   The purpose of this group is to assist patients in learning to regulate negative emotions and experience positive emotions. Patients will be guided to discuss ways in which they have been vulnerable to their negative emotions. These vulnerabilities will be juxtaposed with experiences of positive emotions or situations, and patients will be challenged to use positive emotions to combat negative ones. Special emphasis will be placed on coping with negative emotions in conflict situations, and patients will process healthy conflict resolution skills.  Therapeutic Goals: 1. Patient will identify two positive emotions or experiences to reflect on in order to balance out negative emotions 2. Patient will label two or more emotions that they find the most difficult to experience 3. Patient will demonstrate positive conflict resolution skills through discussion and/or role plays  Summary of Patient Progress: X   Therapeutic Modalities:   Cognitive Behavioral Therapy Feelings Identification Dialectical Behavioral Therapy  Jeremy Reese R. Guerry Bruin, MSW, [place], [place] 12/24/2020 2:03 PM

## 2020-12-24 NOTE — Plan of Care (Signed)
  Problem: Decision Making Goal: STG - Patient will successfully identify 2 ways of making healthy decisions post d/c within 5 recreation therapy group sessions Description: STG - Patient will successfully identify 2 ways of making healthy decisions post d/c within 5 recreation therapy group sessions Outcome: Progressing

## 2020-12-24 NOTE — BHH Counselor (Signed)
CSW did follow up with the patient on PHP programs.  Pt reports that he has not considered PHP programs or thought about it.  CSW again encouraged patient to begin thinking about it so that referrals can be made with adequate enough time for discharge.  Assunta Curtis, MSW, LCSW 12/24/2020 3:10 PM

## 2020-12-24 NOTE — Progress Notes (Signed)
[facility] MD Progress Note  12/24/2020 1:56 PM Jeremy Reese  MRN:  [id number]   Subjective:  70 year old male with history of MDD and GAD presenting after intentional ingestion of Prozac and Remeron resulting in serotonin syndrome and medical admission. Patient had no acute events overnight, medication compliant, attending to ADLs.  Patient seen one-on-one at bedside today. Today he states he had very bad anxiety and depression this morning causing him to have suicidal thoughts. However, he was able to put those thoughts aside after eating breakfast. He notes that he has been trying to remain mindful of the good things he has in his life.   Contacted [employer] work line, [phone], She notes he was cleared to drive in December, but has not driven since then. She does not feel he would be able to do virtual visits because he does not do well reading texts. She notes he had a woman sitting with him from Monday-Thursday prior to this suicide attempt. She is looking into whether or not she has a new job, or could resume looking after him. She also feels he would benefit from outpatient therapy and psychiatric visits.   Principal Problem: MDD (major depressive disorder), recurrent severe, without psychosis ([place]) Diagnosis: Principal Problem:   MDD (major depressive disorder), recurrent severe, without psychosis ([place]) Active Problems:   Hyperlipidemia   Chronic atrial fibrillation (HCC)   Chronic diastolic congestive heart failure (HCC)   Flatback syndrome of thoracolumbar region   Hypertension   Suicide attempt ([place])  Total Time spent with patient: 30 minutes  Past Psychiatric History: Patient has a past history of depression. In 1992 he had a suicide attempt by overdose. Had a hospitalization at that time. Saw Dr. Thurmond Butts for years. Much of the time diagnosed with seasonal affective disorder and took fluoxetine and used a light box in the winter. Between that time and now had not had any  other major depressive episodes. No history of mania.  Past Medical History:  Past Medical History:  Diagnosis Date  . Atrial flutter, paroxysmal ([place])   . Carotid artery disease without cerebral infarction ([place])   . Coronary artery disease 2011  . Flatback syndrome of thoracolumbar region   . H/O calcium pyrophosphate deposition disease (CPPD)   . History of TIA (transient ischemic attack)   . Hyperlipidemia   . Paroxysmal A-fib ([place])   . Spinal stenosis of lumbar region without neurogenic claudication     Past Surgical History:  Procedure Laterality Date  . BILATERAL CARPAL TUNNEL RELEASE    . CARDIAC CATHETERIZATION    . CIRCUMCISION, NON-NEWBORN    . CLIPPING OF ATRIAL APPENDAGE N/A 09/18/2020   Procedure: CLIPPING OF ATRIAL APPENDAGE USING 61 ATRICURE LAA  EXCLUSION SYSTEM;  Surgeon: Ivin Poot, MD;  Location: [facility];  Service: Open Heart Surgery;  Laterality: N/A;  . CORONARY ANGIOPLASTY WITH STENT PLACEMENT  2011  . CORONARY ARTERY BYPASS GRAFT N/A 09/18/2020   Procedure: CORONARY ARTERY BYPASS GRAFTING (CABG)X 3, ON PUMP, USING LEFT INTERAL MAMMARY ARTERY AND ENDOSCOPICALLY HARVESTED RIGHT GREATER SAPHENOUS VEIN. LIMA TO LAD, SVG TO OM, SVG TO PD;  Surgeon: Ivin Poot, MD;  Location: [facility];  Service: Open Heart Surgery;  Laterality: N/A;  . ENDOVEIN HARVEST OF GREATER SAPHENOUS VEIN Right 09/18/2020   Procedure: ENDOVEIN HARVEST OF GREATER SAPHENOUS VEIN;  Surgeon: Ivin Poot, MD;  Location: [facility];  Service: Open Heart Surgery;  Laterality: Right;  . LEFT HEART CATH AND CORONARY ANGIOGRAPHY Left  09/12/2020   Procedure: LEFT HEART CATH AND CORONARY ANGIOGRAPHY;  Surgeon: Minna Merritts, MD;  Location: [facility];  Service: Cardiovascular;  Laterality: Left;  . TEE WITHOUT CARDIOVERSION N/A 09/18/2020   Procedure: TRANSESOPHAGEAL ECHOCARDIOGRAM (TEE);  Surgeon: Prescott Gum, Collier Salina, MD;  Location: [facility];  Service: Open Heart Surgery;  Laterality: N/A;    Family History:  Family History  Problem Relation Age of Onset  . Heart disease Mother 76       CABG x 3   . Hypertension Father    Family Psychiatric  History: Denies Social History:  Social History   Substance and Sexual Activity  Alcohol Use Yes   Comment: rare      Social History   Substance and Sexual Activity  Drug Use No    Social History   Socioeconomic History  . Marital status: Married    Spouse name: Not on file  . Number of children: Not on file  . Years of education: Not on file  . Highest education level: Not on file  Occupational History  . Not on file  Tobacco Use  . Smoking status: Former Smoker    Packs/day: 0.25    Years: 35.00    Pack years: 8.75    Types: Cigarettes    Quit date: 11/29/1993    Years since quitting: 27.0  . Smokeless tobacco: Never Used  Vaping Use  . Vaping Use: Never used  Substance and Sexual Activity  . Alcohol use: Yes    Comment: rare   . Drug use: No  . Sexual activity: Not on file  Other Topics Concern  . Not on file  Social History Narrative  . Not on file   Social Determinants of Health   Financial Resource Strain: Not on file  Food Insecurity: Not on file  Transportation Needs: Not on file  Physical Activity: Not on file  Stress: Not on file  Social Connections: Not on file   Additional Social History:                         Sleep: Fair  Appetite:  Fair  Current Medications: Current Facility-Administered Medications  Medication Dose Route Frequency Provider Last Rate Last Admin  . acetaminophen (TYLENOL) tablet 650 mg  650 mg Oral Q6H PRN [name], MD   650 mg at 12/21/20 2019  . alum & mag hydroxide-simeth (MAALOX/MYLANTA) 200-200-20 MG/5ML suspension 30 mL  30 mL Oral Q4H PRN [name], MD      . apixaban [name]) tablet 5 mg  5 mg Oral BID [name], MD   5 mg at 12/24/20 0754  . aspirin EC tablet 81 mg  81 mg Oral Daily [name], MD   81 mg at  12/24/20 0751  . buPROPion (WELLBUTRIN XL) 24 hr tablet 150 mg  150 mg Oral Daily [name], MD   150 mg at 12/24/20 0752  . carvedilol (COREG) tablet 25 mg  25 mg Oral BID WC [name], MD   25 mg at 12/24/20 0752  . ezetimibe (ZETIA) tablet 10 mg  10 mg Oral Daily [name], MD   10 mg at 12/24/20 0753  . feeding supplement (ENSURE ENLIVE / ENSURE PLUS) liquid 237 mL  237 mL Oral BID BM [name], MD   237 mL at 12/24/20 1000  . gabapentin (NEURONTIN) capsule 600 mg  600 mg Oral TID  [name], MD   600 mg at 12/24/20 1213  . lisinopril (ZESTRIL) tablet 5 mg  5 mg Oral Daily [name], [name], MD   5 mg at 12/24/20 0645  . LORazepam (ATIVAN) tablet 1 mg  1 mg Oral Q6H PRN [name], MD   1 mg at 12/24/20 [id number]  . magnesium hydroxide (MILK OF MAGNESIA) suspension 30 mL  30 mL Oral Daily PRN [name], MD      . multivitamin with minerals tablet 1 tablet  1 tablet Oral Daily [name], MD   1 tablet at 12/24/20 [phone]  . nitroGLYCERIN (NITROSTAT) SL tablet 0.4 mg  0.4 mg Sublingual Q5 min PRN [name], MD      . olopatadine (PATANOL) 0.1 % ophthalmic solution 1 drop  1 drop Both Eyes Q6H PRN [name], [name], MD      . phenylephrine-shark liver oil-mineral oil-petrolatum (PREPARATION H) rectal ointment 1 application  1 application Rectal BID PRN [name], MD   1 application at [phone]  . polyethylene glycol (MIRALAX / GLYCOLAX) packet 17 g  17 g Oral Daily [name], MD   17 g at 12/22/20 0740  . rosuvastatin (CRESTOR) tablet 40 mg  40 mg Oral Daily [name], MD   40 mg at 12/24/20 0753  . senna-docusate (Senokot-S) tablet 2 tablet  2 tablet Oral Daily [name], MD   2 tablet at 12/24/20 [id number]    Lab Results:  No results found for this or any previous visit (from the past 48 hour(s)).  Blood Alcohol level:  Lab Results  Component Value Date   ETH 16 (H) 12/11/2020   ETH <10 [id number]    Metabolic  Disorder Labs: Lab Results  Component Value Date   HGBA1C 6.4 (H) 09/16/2020   MPG 137 09/16/2020   No results found for: PROLACTIN Lab Results  Component Value Date   CHOL 108 09/05/2020   TRIG 76 09/05/2020   HDL 44 09/05/2020   CHOLHDL 2.5 09/05/2020   LDLCALC 48 09/05/2020   LDLCALC 95 04/02/2015    Physical Findings: AIMS: Facial and Oral Movements Muscles of Facial Expression: None, normal Lips and Perioral Area: None, normal Jaw: None, normal Tongue: None, normal,Extremity Movements Upper (arms, wrists, hands, fingers): None, normal Lower (legs, knees, ankles, toes): None, normal, Trunk Movements Neck, shoulders, hips: None, normal, Overall Severity Severity of abnormal movements (highest score from questions above): None, normal Incapacitation due to abnormal movements: None, normal Patient's awareness of abnormal movements (rate only patient's report): No Awareness, Dental Status Current problems with teeth and/or dentures?: No Does patient usually wear dentures?: No  CIWA:    COWS:     Musculoskeletal: Strength & Muscle Tone: decreased Gait & Station: ambulated with walker Patient leans: Front  Psychiatric Specialty Exam: Physical Exam Vitals and nursing note reviewed.  Constitutional:      Appearance: Normal appearance.  HENT:     Head: Normocephalic and atraumatic.     Right Ear: External ear normal.     Left Ear: External ear normal.     Nose: Nose normal.     Mouth/Throat:     Mouth: Mucous membranes are moist.     Pharynx: Oropharynx is clear.  Eyes:     Extraocular Movements: Extraocular movements intact.     Conjunctiva/sclera: Conjunctivae normal.     Pupils: Pupils are equal, round, and reactive to light.  Cardiovascular:  Rate and Rhythm: Normal rate and regular rhythm.     Pulses: Normal pulses.  Pulmonary:     Effort: Pulmonary effort is normal.  Abdominal:     General: Abdomen is flat.     Palpations: Abdomen is soft.   Musculoskeletal:        General: Deformity present. No swelling.     Cervical back: Normal range of motion and neck supple.  Skin:    General: Skin is warm and dry.  Neurological:     General: No focal deficit present.     Mental Status: He is alert and oriented to person, place, and time.  Psychiatric:        Attention and Perception: Attention and perception normal.        Mood and Affect: Mood is depressed. Affect is flat.        Speech: Speech normal.        Behavior: Behavior is withdrawn.        Thought Content: Thought content includes suicidal ideation. Thought content does not include suicidal plan.        Cognition and Memory: Cognition and memory normal.        Judgment: Judgment normal.     Review of Systems  Constitutional: Positive for fatigue. Negative for appetite change.  HENT: Negative for rhinorrhea and sore throat.   Eyes: Negative for photophobia and visual disturbance.  Respiratory: Negative for cough and shortness of breath.   Cardiovascular: Negative for chest pain and palpitations.  Gastrointestinal: Positive for constipation. Negative for diarrhea, nausea and vomiting.  Endocrine: Negative for cold intolerance and heat intolerance.  Genitourinary: Negative for difficulty urinating and dysuria.  Musculoskeletal: Positive for arthralgias, back pain and myalgias.  Skin: Negative for rash and wound.  Allergic/Immunologic: Negative for food allergies and immunocompromised state.  Neurological: Positive for weakness. Negative for dizziness and headaches.  Psychiatric/Behavioral: Positive for dysphoric mood. Negative for suicidal ideas. The patient is nervous/anxious.     Blood pressure (!) 149/69, pulse 74, temperature 97.7 F (36.5 C), temperature source Oral, resp. rate 17, height 5\' 11"  (1.803 m), weight 98 kg, SpO2 100 %.Body mass index is 30.13 kg/m.  General Appearance: Casual  Eye Contact:  Fair  Speech:  Clear and Coherent  Volume:  Decreased   Mood:  Depressed and Dysphoric  Affect:  Congruent  Thought Process:  Coherent and Linear  Orientation:  Full (Time, Place, and Person)  Thought Content:  Logical  Suicidal Thoughts:  Yes.  without intent/plan  Homicidal Thoughts:  No  Memory:  Immediate;   Fair Recent;   Fair Remote;   Fair  Judgement:  Intact  Insight:  Shallow  Psychomotor Activity:  Decreased  Concentration:  Concentration: Fair  Recall:  [employer] of Knowledge:  Fair  Language:  Fair  Akathisia:  Negative  Handed:  Right  AIMS (if indicated):     Assets:  Communication Skills Desire for Improvement Financial Resources/Insurance Housing Intimacy  ADL's:  Intact  Cognition:  WNL  Sleep:  Number of Hours: 8.15     Treatment Plan Summary: Daily contact with patient to assess and evaluate symptoms and progress in treatment and Medication management PLAN OF CARE:70 year old male with history of MDD and GAD presenting after intentional ingestion of Prozac and Remeron resulting in serotonin syndrome and medical admission. At this time patient continues to endorse depressed mood, poor sleep, low energy, anhedonia, hopelessness, helplessness, worthlessness, and passive suicidal ideations. Continue Buproprion XL 150 mg daily  for depression, continue gabapentin to 600 mg TID for pain and anxiety. Ativan PRN for anxiety.PT consult completed, and appreciate recommendations. Home Health PT order completed.   [name], MD 12/24/2020, 1:56 PM

## 2020-12-24 NOTE — Plan of Care (Signed)
Pt rates anxiety and depression 4/10. Pt denies HI and AVH but has passive SI with no plan. Pt contracts for safety. Pt was educated on care plan and verbalizes understanding. Collier Bullock RN Problem: Education: Goal: Utilization of techniques to improve thought processes will improve Outcome: Progressing Goal: Knowledge of the prescribed therapeutic regimen will improve Outcome: Progressing   Problem: Activity: Goal: Interest or engagement in leisure activities will improve Outcome: Progressing Goal: Imbalance in normal sleep/wake cycle will improve Outcome: Progressing   Problem: Coping: Goal: Coping ability will improve Outcome: Progressing Goal: Will verbalize feelings Outcome: Progressing   Problem: Health Behavior/Discharge Planning: Goal: Ability to make decisions will improve Outcome: Progressing Goal: Compliance with therapeutic regimen will improve Outcome: Progressing   Problem: Role Relationship: Goal: Will demonstrate positive changes in social behaviors and relationships Outcome: Progressing   Problem: Safety: Goal: Ability to disclose and discuss suicidal ideas will improve Outcome: Progressing Goal: Ability to identify and utilize support systems that promote safety will improve Outcome: Progressing   Problem: Self-Concept: Goal: Will verbalize positive feelings about self Outcome: Progressing Goal: Level of anxiety will decrease Outcome: Not Progressing   Problem: Education: Goal: Knowledge of General Education information will improve Description: Including pain rating scale, medication(s)/side effects and non-pharmacologic comfort measures Outcome: Progressing   Problem: Health Behavior/Discharge Planning: Goal: Ability to manage health-related needs will improve Outcome: Progressing   Problem: Clinical Measurements: Goal: Ability to maintain clinical measurements within normal limits will improve Outcome: Progressing Goal: Will remain free from  infection Outcome: Progressing Goal: Diagnostic test results will improve Outcome: Progressing Goal: Respiratory complications will improve Outcome: Progressing Goal: Cardiovascular complication will be avoided Outcome: Progressing   Problem: Activity: Goal: Risk for activity intolerance will decrease Outcome: Progressing   Problem: Nutrition: Goal: Adequate nutrition will be maintained Outcome: Progressing   Problem: Coping: Goal: Level of anxiety will decrease Outcome: Not Progressing   Problem: Elimination: Goal: Will not experience complications related to bowel motility Outcome: Progressing Goal: Will not experience complications related to urinary retention Outcome: Progressing   Problem: Pain Managment: Goal: General experience of comfort will improve Outcome: Progressing   Problem: Safety: Goal: Ability to remain free from injury will improve Outcome: Progressing   Problem: Skin Integrity: Goal: Risk for impaired skin integrity will decrease Outcome: Progressing

## 2020-12-24 NOTE — Progress Notes (Signed)
Pt finished with PT, I took his BP before evening meds because his BP was low. I held BP med. Dr. Domingo Cocking was made aware. Gatorade was given and pb crackers. Pt encouraged to hydrate. Pt is safe. Will continue to monitor. Collier Bullock RN

## 2020-12-25 MED ORDER — BUPROPION HCL ER (XL) 150 MG PO TB24
300.0000 mg | ORAL_TABLET | Freq: Every day | ORAL | Status: DC
Start: 2020-12-26 — End: 2020-12-29
  Administered 2020-12-26 – 2020-12-29 (×4): 300 mg via ORAL
  Filled 2020-12-25 (×5): qty 2

## 2020-12-25 NOTE — BHH Counselor (Signed)
CSW spoke with patient again on his treatment options with PHP or outpatient therapy.  Patient appeared fixated on speaking about his physical health concerns and requesting assistance with setting up home health.  CSW notes that home health referral has been sent and accepted, they will contact patient at discharge.  CSW attempted to redirect patient to focusing on mental health and patient was no cooperative, instead discussing his physical health and feeling "weak".  Assunta Curtis, MSW, LCSW 12/25/2020 12:45 PM

## 2020-12-25 NOTE — Progress Notes (Signed)
Recreation Therapy Notes   Date: 12/25/2020   Time: 9:30 am   Location: Craft room     Behavioral response: N/A   Intervention Topic: Decision Making    Discussion/Intervention: Patient did not attend group.   Clinical Observations/Feedback:  Patient did not attend group.   Mickael Mcnutt LRT/CTRS        Bookert Guzzi 12/25/2020 11:12 AM

## 2020-12-25 NOTE — Plan of Care (Signed)
Pt rates depression 2/10. Pt rates anxiety 3/10. Pt denies SI, HI and AVH. Pt was educated on care plan and verbalizes understanding. Collier Bullock RN Problem: Education: Goal: Utilization of techniques to improve thought processes will improve Outcome: Progressing Goal: Knowledge of the prescribed therapeutic regimen will improve Outcome: Progressing   Problem: Activity: Goal: Interest or engagement in leisure activities will improve Outcome: Progressing Goal: Imbalance in normal sleep/wake cycle will improve Outcome: Progressing   Problem: Coping: Goal: Coping ability will improve Outcome: Progressing Goal: Will verbalize feelings Outcome: Progressing   Problem: Health Behavior/Discharge Planning: Goal: Ability to make decisions will improve Outcome: Progressing Goal: Compliance with therapeutic regimen will improve Outcome: Progressing   Problem: Role Relationship: Goal: Will demonstrate positive changes in social behaviors and relationships Outcome: Progressing   Problem: Safety: Goal: Ability to disclose and discuss suicidal ideas will improve Outcome: Progressing Goal: Ability to identify and utilize support systems that promote safety will improve Outcome: Progressing   Problem: Self-Concept: Goal: Will verbalize positive feelings about self Outcome: Progressing Goal: Level of anxiety will decrease Outcome: Progressing   Problem: Education: Goal: Knowledge of General Education information will improve Description: Including pain rating scale, medication(s)/side effects and non-pharmacologic comfort measures Outcome: Progressing   Problem: Health Behavior/Discharge Planning: Goal: Ability to manage health-related needs will improve Outcome: Progressing   Problem: Clinical Measurements: Goal: Ability to maintain clinical measurements within normal limits will improve Outcome: Progressing Goal: Will remain free from infection Outcome: Progressing Goal:  Diagnostic test results will improve Outcome: Progressing Goal: Respiratory complications will improve Outcome: Progressing Goal: Cardiovascular complication will be avoided Outcome: Progressing   Problem: Activity: Goal: Risk for activity intolerance will decrease Outcome: Progressing   Problem: Nutrition: Goal: Adequate nutrition will be maintained Outcome: Progressing   Problem: Coping: Goal: Level of anxiety will decrease Outcome: Progressing   Problem: Elimination: Goal: Will not experience complications related to bowel motility Outcome: Progressing Goal: Will not experience complications related to urinary retention Outcome: Progressing   Problem: Pain Managment: Goal: General experience of comfort will improve Outcome: Progressing   Problem: Safety: Goal: Ability to remain free from injury will improve Outcome: Progressing   Problem: Skin Integrity: Goal: Risk for impaired skin integrity will decrease Outcome: Progressing

## 2020-12-25 NOTE — Progress Notes (Signed)
[facility] MD Progress Note  12/25/2020 12:08 PM Jeremy Reese  MRN:  [id number]   Subjective:  70 year old male with history of MDD and GAD presenting after intentional ingestion of Prozac and Remeron resulting in serotonin syndrome and medical admission. Patient had no acute events overnight, medication compliant, attending to ADLs.  Patient seen one-on-one at bedside today. He states he was having thoughts about being better off dead again last night, and wishing his suicide attempt had succeeded. This morning, he denies any passive or active suicidal ideations. He notes he continues to feel anxious and depressed. When asked about discharge today vs. Tomorrow he felt unprepared to leave either day. He continues to site his wife's absence Monday-Thursday as his main concern about discharge. However, he still has not considered partial hospitalization plans or alternate plans for follow-up care.    Principal Problem: MDD (major depressive disorder), recurrent severe, without psychosis ([place]) Diagnosis: Principal Problem:   MDD (major depressive disorder), recurrent severe, without psychosis ([place]) Active Problems:   Hyperlipidemia   Chronic atrial fibrillation (HCC)   Chronic diastolic congestive heart failure (HCC)   Flatback syndrome of thoracolumbar region   Hypertension   Suicide attempt ([place])  Total Time spent with patient: 30 minutes  Past Psychiatric History: Patient has a past history of depression. In 1992 he had a suicide attempt by overdose. Had a hospitalization at that time. Saw Dr. Thurmond Butts for years. Much of the time diagnosed with seasonal affective disorder and took fluoxetine and used a light box in the winter. Between that time and now had not had any other major depressive episodes. No history of mania.  Past Medical History:  Past Medical History:  Diagnosis Date  . Atrial flutter, paroxysmal ([employer])   . Carotid artery disease without cerebral infarction ([place])   .  Coronary artery disease 2011  . Flatback syndrome of thoracolumbar region   . H/O calcium pyrophosphate deposition disease (CPPD)   . History of TIA (transient ischemic attack)   . Hyperlipidemia   . Paroxysmal A-fib ([place])   . Spinal stenosis of lumbar region without neurogenic claudication     Past Surgical History:  Procedure Laterality Date  . BILATERAL CARPAL TUNNEL RELEASE    . CARDIAC CATHETERIZATION    . CIRCUMCISION, NON-NEWBORN    . CLIPPING OF ATRIAL APPENDAGE N/A 09/18/2020   Procedure: CLIPPING OF ATRIAL APPENDAGE USING 35 ATRICURE LAA  EXCLUSION SYSTEM;  Surgeon: Ivin Poot, MD;  Location: [place];  Service: Open Heart Surgery;  Laterality: N/A;  . CORONARY ANGIOPLASTY WITH STENT PLACEMENT  2011  . CORONARY ARTERY BYPASS GRAFT N/A 09/18/2020   Procedure: CORONARY ARTERY BYPASS GRAFTING (CABG)X 3, ON PUMP, USING LEFT INTERAL MAMMARY ARTERY AND ENDOSCOPICALLY HARVESTED RIGHT GREATER SAPHENOUS VEIN. LIMA TO LAD, SVG TO OM, SVG TO PD;  Surgeon: Ivin Poot, MD;  Location: [place];  Service: Open Heart Surgery;  Laterality: N/A;  . ENDOVEIN HARVEST OF GREATER SAPHENOUS VEIN Right 09/18/2020   Procedure: ENDOVEIN HARVEST OF GREATER SAPHENOUS VEIN;  Surgeon: Ivin Poot, MD;  Location: [place];  Service: Open Heart Surgery;  Laterality: Right;  . LEFT HEART CATH AND CORONARY ANGIOGRAPHY Left 09/12/2020   Procedure: LEFT HEART CATH AND CORONARY ANGIOGRAPHY;  Surgeon: Minna Merritts, MD;  Location: [place] [facility];  Service: Cardiovascular;  Laterality: Left;  . TEE WITHOUT CARDIOVERSION N/A 09/18/2020   Procedure: TRANSESOPHAGEAL ECHOCARDIOGRAM (TEE);  Surgeon: Prescott Gum, Collier Salina, MD;  Location: [place];  Service: Open  Heart Surgery;  Laterality: N/A;   Family History:  Family History  Problem Relation Age of Onset  . Heart disease Mother 23       CABG x 3   . Hypertension Father    Family Psychiatric  History: Denies Social History:  Social History   Substance  and Sexual Activity  Alcohol Use Yes   Comment: rare      Social History   Substance and Sexual Activity  Drug Use No    Social History   Socioeconomic History  . Marital status: Married    Spouse name: Not on file  . Number of children: Not on file  . Years of education: Not on file  . Highest education level: Not on file  Occupational History  . Not on file  Tobacco Use  . Smoking status: Former Smoker    Packs/day: 0.25    Years: 35.00    Pack years: 8.75    Types: Cigarettes    Quit date: 11/29/1993    Years since quitting: 27.0  . Smokeless tobacco: Never Used  Vaping Use  . Vaping Use: Never used  Substance and Sexual Activity  . Alcohol use: Yes    Comment: rare   . Drug use: No  . Sexual activity: Not on file  Other Topics Concern  . Not on file  Social History Narrative  . Not on file   Social Determinants of Health   Financial Resource Strain: Not on file  Food Insecurity: Not on file  Transportation Needs: Not on file  Physical Activity: Not on file  Stress: Not on file  Social Connections: Not on file   Additional Social History:                         Sleep: Fair  Appetite:  Fair  Current Medications: Current Facility-Administered Medications  Medication Dose Route Frequency Provider Last Rate Last Admin  . acetaminophen (TYLENOL) tablet 650 mg  650 mg Oral Q6H PRN [name], MD   650 mg at 12/21/20 2019  . alum & mag hydroxide-simeth (MAALOX/MYLANTA) 200-200-20 MG/5ML suspension 30 mL  30 mL Oral Q4H PRN [name], MD      . apixaban [name]) tablet 5 mg  5 mg Oral BID [name], MD   5 mg at 12/25/20 0739  . aspirin EC tablet 81 mg  81 mg Oral Daily [name], MD   81 mg at 12/25/20 0626  . buPROPion (WELLBUTRIN XL) 24 hr tablet 150 mg  150 mg Oral Daily [name], MD   150 mg at 12/25/20 9485  . carvedilol (COREG) tablet 25 mg  25 mg Oral BID WC [name], MD   25 mg at 12/25/20 4627   . ezetimibe (ZETIA) tablet 10 mg  10 mg Oral Daily [name], MD   10 mg at 12/25/20 0740  . feeding supplement (ENSURE ENLIVE / ENSURE PLUS) liquid 237 mL  237 mL Oral BID BM [name], MD   237 mL at 12/24/20 1400  . gabapentin (NEURONTIN) capsule 600 mg  600 mg Oral TID [name], MD   600 mg at 12/25/20 0350  . lisinopril (ZESTRIL) tablet 5 mg  5 mg Oral Daily [name], [name], MD   5 mg at 12/25/20 0740  . LORazepam (ATIVAN) tablet 1 mg  1 mg Oral Q6H PRN [name], MD   1  mg at 12/25/20 0738  . magnesium hydroxide (MILK OF MAGNESIA) suspension 30 mL  30 mL Oral Daily PRN [name], MD      . multivitamin with minerals tablet 1 tablet  1 tablet Oral Daily [name], MD   1 tablet at 12/25/20 0737  . nitroGLYCERIN (NITROSTAT) SL tablet 0.4 mg  0.4 mg Sublingual Q5 min PRN [name], MD      . olopatadine (PATANOL) 0.1 % ophthalmic solution 1 drop  1 drop Both Eyes Q6H PRN [name], [name], MD      . phenylephrine-shark liver oil-mineral oil-petrolatum (PREPARATION H) rectal ointment 1 application  1 application Rectal BID PRN [name], MD   1 application at [id number]  . polyethylene glycol (MIRALAX / GLYCOLAX) packet 17 g  17 g Oral Daily [name], MD   17 g at 12/22/20 0740  . rosuvastatin (CRESTOR) tablet 40 mg  40 mg Oral Daily [name], MD   40 mg at 12/25/20 0739  . senna-docusate (Senokot-S) tablet 2 tablet  2 tablet Oral Daily [name], MD   2 tablet at 12/25/20 0740    Lab Results:  No results found for this or any previous visit (from the past 48 hour(s)).  Blood Alcohol level:  Lab Results  Component Value Date   ETH 16 (H) 12/11/2020   ETH <10 99991111    Metabolic Disorder Labs: Lab Results  Component Value Date   HGBA1C 6.4 (H) 09/16/2020   MPG 137 09/16/2020   No results found for: PROLACTIN Lab Results  Component Value Date   CHOL 108 09/05/2020   TRIG 76 09/05/2020   HDL 44  09/05/2020   CHOLHDL 2.5 09/05/2020   LDLCALC 48 09/05/2020   LDLCALC 95 04/02/2015    Physical Findings: AIMS: Facial and Oral Movements Muscles of Facial Expression: None, normal Lips and Perioral Area: None, normal Jaw: None, normal Tongue: None, normal,Extremity Movements Upper (arms, wrists, hands, fingers): None, normal Lower (legs, knees, ankles, toes): None, normal, Trunk Movements Neck, shoulders, hips: None, normal, Overall Severity Severity of abnormal movements (highest score from questions above): None, normal Incapacitation due to abnormal movements: None, normal Patient's awareness of abnormal movements (rate only patient's report): No Awareness, Dental Status Current problems with teeth and/or dentures?: No Does patient usually wear dentures?: No  CIWA:    COWS:     Musculoskeletal: Strength & Muscle Tone: decreased Gait & Station: ambulated with walker Patient leans: Front  Psychiatric Specialty Exam: Physical Exam Vitals and nursing note reviewed.  Constitutional:      Appearance: Normal appearance.  HENT:     Head: Normocephalic and atraumatic.     Right Ear: External ear normal.     Left Ear: External ear normal.     Nose: Nose normal.     Mouth/Throat:     Mouth: Mucous membranes are moist.     Pharynx: Oropharynx is clear.  Eyes:     Extraocular Movements: Extraocular movements intact.     Conjunctiva/sclera: Conjunctivae normal.     Pupils: Pupils are equal, round, and reactive to light.  Cardiovascular:     Rate and Rhythm: Normal rate and regular rhythm.     Pulses: Normal pulses.  Pulmonary:     Effort: Pulmonary effort is normal.  Abdominal:     General: Abdomen is flat.     Palpations: Abdomen is soft.  Musculoskeletal:        General:  Deformity present. No swelling.     Cervical back: Normal range of motion and neck supple.  Skin:    General: Skin is warm and dry.  Neurological:     General: No focal deficit present.     Mental  Status: He is alert and oriented to person, place, and time.  Psychiatric:        Attention and Perception: Attention and perception normal.        Mood and Affect: Mood is depressed. Affect is flat.        Speech: Speech normal.        Behavior: Behavior is withdrawn.        Thought Content: Thought content does not include suicidal ideation. Thought content does not include suicidal plan.        Cognition and Memory: Cognition and memory normal.        Judgment: Judgment normal.     Review of Systems  Constitutional: Positive for fatigue. Negative for appetite change.  HENT: Negative for rhinorrhea and sore throat.   Eyes: Negative for photophobia and visual disturbance.  Respiratory: Negative for cough and shortness of breath.   Cardiovascular: Negative for chest pain and palpitations.  Gastrointestinal: Positive for constipation. Negative for diarrhea, nausea and vomiting.  Endocrine: Negative for cold intolerance and heat intolerance.  Genitourinary: Negative for difficulty urinating and dysuria.  Musculoskeletal: Positive for arthralgias, back pain and myalgias.  Skin: Negative for rash and wound.  Allergic/Immunologic: Negative for food allergies and immunocompromised state.  Neurological: Positive for weakness. Negative for dizziness and headaches.  Psychiatric/Behavioral: Positive for dysphoric mood. Negative for suicidal ideas. The patient is nervous/anxious.     Blood pressure (!) 144/87, pulse 81, temperature 98.1 F (36.7 C), temperature source Oral, resp. rate 18, height 5\' 11"  (1.803 m), weight 98 kg, SpO2 97 %.Body mass index is 30.13 kg/m.  General Appearance: Casual  Eye Contact:  Fair  Speech:  Clear and Coherent  Volume:  Decreased  Mood:  Depressed and Dysphoric  Affect:  Congruent  Thought Process:  Coherent and Linear  Orientation:  Full (Time, Place, and Person)  Thought Content:  Logical  Suicidal Thoughts:  No  Homicidal Thoughts:  No  Memory:   Immediate;   Fair Recent;   Fair Remote;   Fair  Judgement:  Intact  Insight:  Shallow  Psychomotor Activity:  Decreased  Concentration:  Concentration: Fair  Recall:  [employer] of Knowledge:  Fair  Language:  Fair  Akathisia:  Negative  Handed:  Right  AIMS (if indicated):     Assets:  Communication Skills Desire for Improvement Financial Resources/Insurance Housing Intimacy  ADL's:  Intact  Cognition:  WNL  Sleep:  Number of Hours: 8.75     Treatment Plan Summary: Daily contact with patient to assess and evaluate symptoms and progress in treatment and Medication management PLAN OF CARE:70 year old male with history of MDD and GAD presenting after intentional ingestion of Prozac and Remeron resulting in serotonin syndrome and medical admission. At this time patient continues to endorse depressed mood, poor sleep, low energy, anhedonia, hopelessness, helplessness, worthlessness, and passive suicidal ideations. Increase Buproprion XL 300 mg daily for depression, continue gabapentin to 600 mg TID for pain and anxiety. Ativan PRN for anxiety.PT consult completed, and appreciate recommendations. Home Health PT order completed.   [name], MD 12/25/2020, 12:08 PM

## 2020-12-25 NOTE — Progress Notes (Signed)
Patient calm and compliant during assessment denying HI/AVH. Patient endorses anxiety and depression. Patient endorses passive SI, verbally contracts for safety. Patient observed interacting appropriately with staff and peers on the unit. Patient didn't have any night medications scheduled and didn't request anything PRN. Patient given education, support, and encouragement to be active in his treatment plan. Patient being monitored Q 15 minutes for safety per unit protocol. Pt remains safe on the unit. 

## 2020-12-25 NOTE — Care Management Important Message (Signed)
Important Message  Patient Details  Name: Jeremy Reese MRN: [id number] Date of Birth: [birth year]   Medicare Important Message Given:  Yes  Patient was given important message and instructed on appeal process/contact information for Judithann Graves was provided.  Shirl Harris, LCSW 12/25/2020, 9:23 AM

## 2020-12-25 NOTE — Progress Notes (Signed)
Patient is calm and cooperative during assessment. He denies SI, HI, and AVH. He appears depressed and his affect is flat, but states that he has no concerns at the time. He states he has some mild back pain but does not need anything for it at this time. Patient remains isolative to his room.  Patient remains safe on the unit at this time. Q15 minute safety checks are maintained.

## 2020-12-25 NOTE — B[facility] Group Notes (Signed)
LCSW Group Therapy Note  12/25/2020 1:59 PM  Type of Therapy/Topic:  Group Therapy:  Balance in Life  Participation Level:  Did Not Attend  Description of Group:    This group will address the concept of balance and how it feels and looks when one is unbalanced. Patients will be encouraged to process areas in their lives that are out of balance and identify reasons for remaining unbalanced. Facilitators will guide patients in utilizing problem-solving interventions to address and correct the stressor making their life unbalanced. Understanding and applying boundaries will be explored and addressed for obtaining and maintaining a balanced life. Patients will be encouraged to explore ways to assertively make their unbalanced needs known to significant others in their lives, using other group members and facilitator for support and feedback.  Therapeutic Goals: 1. Patient will identify two or more emotions or situations they have that consume much of in their lives. 2. Patient will identify signs/triggers that life has become out of balance:  3. Patient will identify two ways to set boundaries in order to achieve balance in their lives:  4. Patient will demonstrate ability to communicate their needs through discussion and/or role plays  Summary of Patient Progress: X  Therapeutic Modalities:   Cognitive Behavioral Therapy Solution-Focused Therapy Assertiveness Training  Assunta Curtis MSW, LCSW 12/25/2020 1:59 PM

## 2020-12-25 NOTE — BHH Counselor (Signed)
CSW spoke with the patient again about upcoming discharge.  Patient asked for a date of discharge and CSW encouraged patient to speak with psychiatrist.  Pt reports "I can't think right now I have never been in this type of situation." Patient stated that "I just want to be a little stronger" prior to discharge.  Patient reports that he is referring to physically as opposed to mentally.  CSW pointed out that referral has been sent and accepted in regards to home health PT.  Patient was agreeable to this.   Patient asked to use CSW cell phone to call his peer Jana Half to see if she will cover his bill.  CSW encouraged patient to use the phones in the hallway.  Assunta Curtis, MSW, LCSW 12/25/2020 2:45 PM

## 2020-12-25 NOTE — B[facility] Counselor (Signed)
CSW notes that patient stopped her to review again the [employer] appeal form. CSW reviewed the form with the patient again, pointing out that appeal could not be started until discharge order has been signed.    Pt indicated a plan to appeal.    Assunta Curtis, MSW, LCSW 12/25/2020 10:11 AM

## 2020-12-26 LAB — CBC
HCT: 37.2 % — ABNORMAL LOW (ref 39.0–52.0)
Hemoglobin: 11.7 g/dL — ABNORMAL LOW (ref 13.0–17.0)
MCH: 31.7 pg (ref 26.0–34.0)
MCHC: 31.5 g/dL (ref 30.0–36.0)
MCV: 100.8 fL — ABNORMAL HIGH (ref 80.0–100.0)
Platelets: 152 10*3/uL (ref 150–400)
RBC: 3.69 MIL/uL — ABNORMAL LOW (ref 4.22–5.81)
RDW: 13.7 % (ref 11.5–15.5)
WBC: 6.5 10*3/uL (ref 4.0–10.5)
nRBC: 0 % (ref 0.0–0.2)

## 2020-12-26 NOTE — Progress Notes (Signed)
[facility] MD Progress Note  12/26/2020 12:12 PM Jeremy Reese  MRN:  [id number]   Subjective:  70 year old male with history of MDD and GAD presenting after intentional ingestion of Prozac and Remeron resulting in serotonin syndrome and medical admission. Patient had no acute events overnight, medication compliant, attending to ADLs.  Patient seen one-on-one at bedside today. He denies suicidal ideations, homicidal ideations, visual hallucinations, and auditory hallucinations. However, he does continues to endorse depression, anxiety, and overall sense of hopelessness and worthlessness. He continues to wonder if he is more of a burden to his wife alive than if he were gone from most recent suicide attempt. He worries that he will attempt suicide again if he returns home. His main concern remains being home alone Mon-Thurs when wife is at work. He has yet to look into offered options of partial hospitalization vs. Outpatient follow-up with provider. Patient also enquires about possibility of getting ECT again. He has had a previous course at [facility] that he felt worked really well, and was maintained on maintenance ECT for approximately 4 years before it was discontinued secondary to his a.fib. He requests that I enquire about possibility of restarting with her current cardiac conditions. ECT team notified of request. We also discussed possiblity of [place] if he is not a candidate for ECT. Information provided about both interventions.   Principal Problem: MDD (major depressive disorder), recurrent severe, without psychosis ([place]) Diagnosis: Principal Problem:   MDD (major depressive disorder), recurrent severe, without psychosis ([place]) Active Problems:   Hyperlipidemia   Chronic atrial fibrillation (HCC)   Chronic diastolic congestive heart failure (HCC)   Flatback syndrome of thoracolumbar region   Hypertension   Suicide attempt ([place])  Total Time spent with patient: 30 minutes  Past Psychiatric History:  Patient has a past history of depression. In 1992 he had a suicide attempt by overdose. Had a hospitalization at that time. Saw Dr. Thurmond Butts for years. Much of the time diagnosed with seasonal affective disorder and took fluoxetine and used a light box in the winter. Between that time and now had not had any other major depressive episodes. No history of mania.  Past Medical History:  Past Medical History:  Diagnosis Date  . Atrial flutter, paroxysmal ([place])   . Carotid artery disease without cerebral infarction ([place])   . Coronary artery disease 2011  . Flatback syndrome of thoracolumbar region   . H/O calcium pyrophosphate deposition disease (CPPD)   . History of TIA (transient ischemic attack)   . Hyperlipidemia   . Paroxysmal A-fib ([place])   . Spinal stenosis of lumbar region without neurogenic claudication     Past Surgical History:  Procedure Laterality Date  . BILATERAL CARPAL TUNNEL RELEASE    . CARDIAC CATHETERIZATION    . CIRCUMCISION, NON-NEWBORN    . CLIPPING OF ATRIAL APPENDAGE N/A 09/18/2020   Procedure: CLIPPING OF ATRIAL APPENDAGE USING 45 ATRICURE LAA  EXCLUSION SYSTEM;  Surgeon: Ivin Poot, MD;  Location: [place];  Service: Open Heart Surgery;  Laterality: N/A;  . CORONARY ANGIOPLASTY WITH STENT PLACEMENT  2011  . CORONARY ARTERY BYPASS GRAFT N/A 09/18/2020   Procedure: CORONARY ARTERY BYPASS GRAFTING (CABG)X 3, ON PUMP, USING LEFT INTERAL MAMMARY ARTERY AND ENDOSCOPICALLY HARVESTED RIGHT GREATER SAPHENOUS VEIN. LIMA TO LAD, SVG TO OM, SVG TO PD;  Surgeon: Ivin Poot, MD;  Location: [place];  Service: Open Heart Surgery;  Laterality: N/A;  . ENDOVEIN HARVEST OF GREATER SAPHENOUS VEIN Right 09/18/2020   Procedure:  ENDOVEIN HARVEST OF GREATER SAPHENOUS VEIN;  Surgeon: Ivin Poot, MD;  Location: [place];  Service: Open Heart Surgery;  Laterality: Right;  . LEFT HEART CATH AND CORONARY ANGIOGRAPHY Left 09/12/2020   Procedure: LEFT HEART CATH AND CORONARY  ANGIOGRAPHY;  Surgeon: Minna Merritts, MD;  Location: [place] CV [facility];  Service: Cardiovascular;  Laterality: Left;  . TEE WITHOUT CARDIOVERSION N/A 09/18/2020   Procedure: TRANSESOPHAGEAL ECHOCARDIOGRAM (TEE);  Surgeon: Prescott Gum, Collier Salina, MD;  Location: [place];  Service: Open Heart Surgery;  Laterality: N/A;   Family History:  Family History  Problem Relation Age of Onset  . Heart disease Mother 64       CABG x 3   . Hypertension Father    Family Psychiatric  History: Denies Social History:  Social History   Substance and Sexual Activity  Alcohol Use Yes   Comment: rare      Social History   Substance and Sexual Activity  Drug Use No    Social History   Socioeconomic History  . Marital status: Married    Spouse name: Not on file  . Number of children: Not on file  . Years of education: Not on file  . Highest education level: Not on file  Occupational History  . Not on file  Tobacco Use  . Smoking status: Former Smoker    Packs/day: 0.25    Years: 35.00    Pack years: 8.75    Types: Cigarettes    Quit date: 11/29/1993    Years since quitting: 27.0  . Smokeless tobacco: Never Used  Vaping Use  . Vaping Use: Never used  Substance and Sexual Activity  . Alcohol use: Yes    Comment: rare   . Drug use: No  . Sexual activity: Not on file  Other Topics Concern  . Not on file  Social History Narrative  . Not on file   Social Determinants of Health   Financial Resource Strain: Not on file  Food Insecurity: Not on file  Transportation Needs: Not on file  Physical Activity: Not on file  Stress: Not on file  Social Connections: Not on file   Additional Social History:                         Sleep: Fair  Appetite:  Fair  Current Medications: Current Facility-Administered Medications  Medication Dose Route Frequency Provider Last Rate Last Admin  . acetaminophen (TYLENOL) tablet 650 mg  650 mg Oral Q6H PRN [name], MD   650 mg at  12/21/20 2019  . alum & mag hydroxide-simeth (MAALOX/MYLANTA) 200-200-20 MG/5ML suspension 30 mL  30 mL Oral Q4H PRN [name], MD   30 mL at 12/26/20 1009  . apixaban (ELIQUIS) tablet 5 mg  5 mg Oral BID [name], MD   5 mg at 12/26/20 3790  . aspirin EC tablet 81 mg  81 mg Oral Daily [name], MD   81 mg at 12/26/20 2409  . buPROPion (WELLBUTRIN XL) 24 hr tablet 300 mg  300 mg Oral Daily [name], MD   300 mg at 12/26/20 7353  . carvedilol (COREG) tablet 25 mg  25 mg Oral BID WC [name], MD   25 mg at 12/26/20 0645  . ezetimibe (ZETIA) tablet 10 mg  10 mg Oral Daily [name], MD   10 mg at 12/26/20 2992  . feeding supplement (ENSURE  ENLIVE / ENSURE PLUS) liquid 237 mL  237 mL Oral BID BM [name], MD   237 mL at 12/26/20 1024  . gabapentin (NEURONTIN) capsule 600 mg  600 mg Oral TID [name], MD   600 mg at 12/26/20 [id number]  . lisinopril (ZESTRIL) tablet 5 mg  5 mg Oral Daily [name], [name], MD   5 mg at 12/26/20 0645  . LORazepam (ATIVAN) tablet 1 mg  1 mg Oral Q6H PRN [name], MD   1 mg at 12/26/20 0830  . magnesium hydroxide (MILK OF MAGNESIA) suspension 30 mL  30 mL Oral Daily PRN [name], MD      . multivitamin with minerals tablet 1 tablet  1 tablet Oral Daily [name], MD   1 tablet at 12/26/20 [phone]  . nitroGLYCERIN (NITROSTAT) SL tablet 0.4 mg  0.4 mg Sublingual Q5 min PRN [name], MD      . olopatadine (PATANOL) 0.1 % ophthalmic solution 1 drop  1 drop Both Eyes Q6H PRN [name], [name], MD      . phenylephrine-shark liver oil-mineral oil-petrolatum (PREPARATION H) rectal ointment 1 application  1 application Rectal BID PRN [name], MD   1 application at [phone] [id number]  . polyethylene glycol (MIRALAX / GLYCOLAX) packet 17 g  17 g Oral Daily [name], MD   17 g at 12/26/20 [id number]  . rosuvastatin (CRESTOR) tablet 40 mg  40 mg Oral Daily [name], MD   40 mg at 12/26/20 [id number]   . senna-docusate (Senokot-S) tablet 2 tablet  2 tablet Oral Daily [name], MD   2 tablet at 12/26/20 [id number]    Lab Results:  No results found for this or any previous visit (from the past 48 hour(s)).  Blood Alcohol level:  Lab Results  Component Value Date   ETH 16 (H) 12/11/2020   ETH <10 [id number]    Metabolic Disorder Labs: Lab Results  Component Value Date   HGBA1C 6.4 (H) 09/16/2020   MPG 137 09/16/2020   No results found for: PROLACTIN Lab Results  Component Value Date   CHOL 108 09/05/2020   TRIG 76 09/05/2020   HDL 44 09/05/2020   CHOLHDL 2.5 09/05/2020   LDLCALC 48 09/05/2020   LDLCALC 95 04/02/2015    Physical Findings: AIMS: Facial and Oral Movements Muscles of Facial Expression: None, normal Lips and Perioral Area: None, normal Jaw: None, normal Tongue: None, normal,Extremity Movements Upper (arms, wrists, hands, fingers): None, normal Lower (legs, knees, ankles, toes): None, normal, Trunk Movements Neck, shoulders, hips: None, normal, Overall Severity Severity of abnormal movements (highest score from questions above): None, normal Incapacitation due to abnormal movements: None, normal Patient's awareness of abnormal movements (rate only patient's report): No Awareness, Dental Status Current problems with teeth and/or dentures?: No Does patient usually wear dentures?: No  CIWA:    COWS:     Musculoskeletal: Strength & Muscle Tone: decreased Gait & Station: ambulated with walker Patient leans: Front  Psychiatric Specialty Exam: Physical Exam Vitals and nursing note reviewed.  Constitutional:      Appearance: Normal appearance.  HENT:     Head: Normocephalic and atraumatic.     Right Ear: External ear normal.     Left Ear: External ear normal.     Nose: Nose normal.     Mouth/Throat:     Mouth: Mucous membranes are moist.     Pharynx: Oropharynx is clear.  Eyes:     Extraocular Movements: Extraocular movements intact.      Conjunctiva/sclera: Conjunctivae normal.     Pupils: Pupils are equal, round, and reactive to light.  Cardiovascular:     Rate and Rhythm: Normal rate and regular rhythm.     Pulses: Normal pulses.  Pulmonary:     Effort: Pulmonary effort is normal.  Abdominal:     General: Abdomen is flat.     Palpations: Abdomen is soft.  Musculoskeletal:        General: Deformity present. No swelling.     Cervical back: Normal range of motion and neck supple.  Skin:    General: Skin is warm and dry.  Neurological:     General: No focal deficit present.     Mental Status: He is alert and oriented to person, place, and time.  Psychiatric:        Attention and Perception: Attention and perception normal.        Mood and Affect: Mood is depressed. Affect is flat.        Speech: Speech normal.        Behavior: Behavior is withdrawn.        Thought Content: Thought content does not include suicidal ideation. Thought content does not include suicidal plan.        Cognition and Memory: Cognition and memory normal.        Judgment: Judgment normal.     Review of Systems  Constitutional: Positive for fatigue. Negative for appetite change.  HENT: Negative for rhinorrhea and sore throat.   Eyes: Negative for photophobia and visual disturbance.  Respiratory: Negative for cough and shortness of breath.   Cardiovascular: Negative for chest pain and palpitations.  Gastrointestinal: Positive for constipation. Negative for diarrhea, nausea and vomiting.  Endocrine: Negative for cold intolerance and heat intolerance.  Genitourinary: Negative for difficulty urinating and dysuria.  Musculoskeletal: Positive for arthralgias, back pain and myalgias.  Skin: Negative for rash and wound.  Allergic/Immunologic: Negative for food allergies and immunocompromised state.  Neurological: Positive for weakness. Negative for dizziness and headaches.  Psychiatric/Behavioral: Positive for dysphoric mood. Negative for suicidal  ideas. The patient is nervous/anxious.     Blood pressure 110/61, pulse 61, temperature 97.8 F (36.6 C), temperature source Oral, resp. rate 18, height 5\' 11"  (1.803 m), weight 98 kg, SpO2 100 %.Body mass index is 30.13 kg/m.  General Appearance: Casual  Eye Contact:  Fair  Speech:  Clear and Coherent  Volume:  Decreased  Mood:  Depressed and Dysphoric  Affect:  Congruent  Thought Process:  Coherent and Linear  Orientation:  Full (Time, Place, and Person)  Thought Content:  Logical  Suicidal Thoughts:  No  Homicidal Thoughts:  No  Memory:  Immediate;   Fair Recent;   Fair Remote;   Fair  Judgement:  Intact  Insight:  Shallow  Psychomotor Activity:  Decreased  Concentration:  Concentration: Fair  Recall:  [employer] of Knowledge:  Fair  Language:  Fair  Akathisia:  Negative  Handed:  Right  AIMS (if indicated):     Assets:  Communication Skills Desire for Improvement Financial Resources/Insurance Housing Intimacy  ADL's:  Intact  Cognition:  WNL  Sleep:  Number of Hours: 8.75     Treatment Plan Summary: Daily contact with patient to assess and evaluate symptoms and progress in treatment and Medication management PLAN OF CARE:70 year old male with history of MDD and GAD presenting after intentional ingestion of Prozac and Remeron  resulting in serotonin syndrome and medical admission. At this time patient continues to endorse depressed mood, poor sleep, low energy, anhedonia, hopelessness, helplessness, worthlessness, and passive suicidal ideations. Continue Buproprion XL 300 mg daily for depression, continue gabapentin to 600 mg TID for pain and anxiety. Ativan PRN for anxiety.PT consult completed, and appreciate recommendations. Home Health PT order completed.   [name], MD 12/26/2020, 12:12 PM

## 2020-12-26 NOTE — Progress Notes (Signed)
Patient's morning BP was 182/99. Patient was given his lisinopril and carvedilol early. Pt stated he did not have a headache and voiced no physical concerns. He was pleasant and cooperative.

## 2020-12-26 NOTE — Plan of Care (Signed)
Patient has been a little anxious reporting that he is not ready to go home, that he needs to address his physical/medical needs. MD and SW  notified. Patient is otherwise cooperative and denying SI/HI/AVH. Had breakfast and received AM medications. Requested Ativan for anxiety. Emotional support provided. Safety monitored as expected.

## 2020-12-26 NOTE — BHH Counselor (Signed)
CSW met with patient to discuss process of appealing his discharge. CSW provided patient with [facility] phone and re-explained the process and highlighted the phone number. Patient provided CSW with case number afterwards ([id number]). Situation ongoing.   Signed:  Durenda Hurt, MSW, [place], LCASA 12/26/2020 10:38 AM

## 2020-12-26 NOTE — Progress Notes (Signed)
Completed DND and [id number] to assist with appeal. Unable to acquire esignature due to patient status.

## 2020-12-26 NOTE — BHH Group Notes (Signed)
LCSW Group Therapy Note  12/26/2020 2:04 PM  Type of Therapy and Topic:  Group Therapy:  Feelings around Relapse and Recovery  Participation Level:  Did Not Attend   Description of Group:    Patients in this group will discuss emotions they experience before and after a relapse. They will process how experiencing these feelings, or avoidance of experiencing them, relates to having a relapse. Facilitator will guide patients to explore emotions they have related to recovery. Patients will be encouraged to process which emotions are more powerful. They will be guided to discuss the emotional reaction significant others in their lives may have to their relapse or recovery. Patients will be assisted in exploring ways to respond to the emotions of others without this contributing to a relapse.  Therapeutic Goals: 1. Patient will identify two or more emotions that lead to a relapse for them 2. Patient will identify two emotions that result when they relapse 3. Patient will identify two emotions related to recovery 4. Patient will demonstrate ability to communicate their needs through discussion and/or role plays   Summary of Patient Progress: Patient did not attend group despite encouraged participation.    Therapeutic Modalities:   Cognitive Behavioral Therapy Solution-Focused Therapy Assertiveness Training Relapse Prevention Therapy   Paulla Dolly, MSW, [place], [place] 12/26/2020 2:04 PM

## 2020-12-26 NOTE — Progress Notes (Signed)
Pt presents as anxious but is in a pleasant mood. He is observed to be ambulating in the hall and to the dayroom in the evening. Pt states, "for some reason I started to think of happy thoughts today". He feels his depression is improving and engages this Probation officer in more conversation than previous encounters. Pt denies SI, HI, and AVH.  Pt is given medications per MD orders. Support and encouragement is provided. Pt remains safe on the unit at this time. Q15 minute safety checks are maintained.

## 2020-12-26 NOTE — Progress Notes (Signed)
Recreation Therapy Notes  Date: 12/26/2020  Time: 9:30 am   Location: Craft room     Behavioral response: N/A   Intervention Topic: Time Management   Discussion/Intervention: Patient did not attend group.   Clinical Observations/Feedback:  Patient did not attend group.   Shanyla Marconi LRT/CTRS         Velina Drollinger 12/26/2020 10:29 AM

## 2020-12-27 NOTE — BHH Counselor (Signed)
CSW met with patient on 12/26/2020 at roughly 1630 to provide him with detailed notice of discharge and [id number]. CSW summarized documents and further explanation of the appeal process at this time. Patient was given paper copies.  Signed:  Durenda Hurt, MSW, [place], LCASA 12/27/2020 10:26 AM

## 2020-12-27 NOTE — Progress Notes (Signed)
Patient got up around dinner time and stayed in the milieu. No mental status change from previous assessment. Patient ate dinner and received scheduled medications. Received phone calls from his wife and reported that she is supportive.

## 2020-12-27 NOTE — BHH Counselor (Signed)
CSW met with patient while completing rounds. CSW updating patient on status of his discharge appeal. Patient expressed no other concerns at this time.   Signed:  Durenda Hurt, MSW, [place], LCASA 12/27/2020 2:16 PM

## 2020-12-27 NOTE — Progress Notes (Addendum)
[facility] MD Progress Note  12/27/2020 10:10 AM Jeremy Reese  MRN:  [id number]   Subjective:  70 year old male with history of MDD and GAD presenting after intentional ingestion of Prozac and Remeron resulting in serotonin syndrome and medical admission. Patient had no acute events overnight, medication compliant, attending to ADLs.  Patient was seen in his room. Volunteers that he had some "happy thoughts puch through" yesterday that pertained to his marriage, fond memories with his parents who are now deceased, and memories of home-life in general. The improvement in mood has carried forwarded to today. Tells me that he is retired and his wife is a Copywriter, advertising. Says that he is feeling better today than previous days,and surmises that perhaps his meds are starting to work. He did sleep well last night. Depression is a 5 out of 10 as his anxiety. No thoughts of suicide. Says that he is worried about money issues and bills after he gets back to home. Requests more Ensure and snacks in between meals.  Principal Problem: MDD (major depressive disorder), recurrent severe, without psychosis ([place]) Diagnosis: Principal Problem:   MDD (major depressive disorder), recurrent severe, without psychosis ([place]) Active Problems:   Hyperlipidemia   Chronic atrial fibrillation (HCC)   Chronic diastolic congestive heart failure (HCC)   Flatback syndrome of thoracolumbar region   Hypertension   Suicide attempt ([place])  Total Time spent with patient: 30 minutes  Past Psychiatric History: Patient has a past history of depression. In 1992 he had a suicide attempt by overdose. Had a hospitalization at that time. Saw Dr. Thurmond Reese for years. Much of the time diagnosed with seasonal affective disorder and took fluoxetine and used a light box in the winter. Between that time and now had not had any other major depressive episodes. No history of mania.  Past Medical History:  Past Medical History:  Diagnosis Date   . Atrial flutter, paroxysmal ([place])   . Carotid artery disease without cerebral infarction ([place])   . Coronary artery disease 2011  . Flatback syndrome of thoracolumbar region   . H/O calcium pyrophosphate deposition disease (CPPD)   . History of TIA (transient ischemic attack)   . Hyperlipidemia   . Paroxysmal A-fib ([place])   . Spinal stenosis of lumbar region without neurogenic claudication     Past Surgical History:  Procedure Laterality Date  . BILATERAL CARPAL TUNNEL RELEASE    . CARDIAC CATHETERIZATION    . CIRCUMCISION, NON-NEWBORN    . CLIPPING OF ATRIAL APPENDAGE N/A 09/18/2020   Procedure: CLIPPING OF ATRIAL APPENDAGE USING 36 ATRICURE LAA  EXCLUSION SYSTEM;  Surgeon: Ivin Poot, MD;  Location: [place];  Service: Open Heart Surgery;  Laterality: N/A;  . CORONARY ANGIOPLASTY WITH STENT PLACEMENT  2011  . CORONARY ARTERY BYPASS GRAFT N/A 09/18/2020   Procedure: CORONARY ARTERY BYPASS GRAFTING (CABG)X 3, ON PUMP, USING LEFT INTERAL MAMMARY ARTERY AND ENDOSCOPICALLY HARVESTED RIGHT GREATER SAPHENOUS VEIN. LIMA TO LAD, SVG TO OM, SVG TO PD;  Surgeon: Ivin Poot, MD;  Location: [place];  Service: Open Heart Surgery;  Laterality: N/A;  . ENDOVEIN HARVEST OF GREATER SAPHENOUS VEIN Right 09/18/2020   Procedure: ENDOVEIN HARVEST OF GREATER SAPHENOUS VEIN;  Surgeon: Ivin Poot, MD;  Location: [place];  Service: Open Heart Surgery;  Laterality: Right;  . LEFT HEART CATH AND CORONARY ANGIOGRAPHY Left 09/12/2020   Procedure: LEFT HEART CATH AND CORONARY ANGIOGRAPHY;  Surgeon: Minna Merritts, MD;  Location: [place] [facility];  Service:  Cardiovascular;  Laterality: Left;  . TEE WITHOUT CARDIOVERSION N/A 09/18/2020   Procedure: TRANSESOPHAGEAL ECHOCARDIOGRAM (TEE);  Surgeon: Prescott Gum, Collier Salina, MD;  Location: [place];  Service: Open Heart Surgery;  Laterality: N/A;   Family History:  Family History  Problem Relation Age of Onset  . Heart disease Mother 39       CABG x 3   .  Hypertension Father    Family Psychiatric  History: Denies Social History:  Social History   Substance and Sexual Activity  Alcohol Use Yes   Comment: rare      Social History   Substance and Sexual Activity  Drug Use No    Social History   Socioeconomic History  . Marital status: Married    Spouse name: Not on file  . Number of children: Not on file  . Years of education: Not on file  . Highest education level: Not on file  Occupational History  . Not on file  Tobacco Use  . Smoking status: Former Smoker    Packs/day: 0.25    Years: 35.00    Pack years: 8.75    Types: Cigarettes    Quit date: 11/29/1993    Years since quitting: 27.0  . Smokeless tobacco: Never Used  Vaping Use  . Vaping Use: Never used  Substance and Sexual Activity  . Alcohol use: Yes    Comment: rare   . Drug use: No  . Sexual activity: Not on file  Other Topics Concern  . Not on file  Social History Narrative  . Not on file   Social Determinants of Health   Financial Resource Strain: Not on file  Food Insecurity: Not on file  Transportation Needs: Not on file  Physical Activity: Not on file  Stress: Not on file  Social Connections: Not on file   Additional Social History:                         Sleep: Fair  Appetite:  Fair  Current Medications: Current Facility-Administered Medications  Medication Dose Route Frequency Provider Last Rate Last Admin  . acetaminophen (TYLENOL) tablet 650 mg  650 mg Oral Q6H PRN [name], MD   650 mg at 12/21/20 2019  . alum & mag hydroxide-simeth (MAALOX/MYLANTA) 200-200-20 MG/5ML suspension 30 mL  30 mL Oral Q4H PRN [name], MD   30 mL at 12/26/20 1009  . apixaban (ELIQUIS) tablet 5 mg  5 mg Oral BID [name], MD   5 mg at 12/27/20 [id number]  . aspirin EC tablet 81 mg  81 mg Oral Daily [name], MD   81 mg at 12/27/20 [id number]  . buPROPion (WELLBUTRIN XL) 24 hr tablet 300 mg  300 mg Oral Daily [name], MD    300 mg at 12/27/20 [id number]  . carvedilol (COREG) tablet 25 mg  25 mg Oral BID WC [name], MD   25 mg at 12/27/20 [id number]  . ezetimibe (ZETIA) tablet 10 mg  10 mg Oral Daily [name], MD   10 mg at 12/27/20 0805  . feeding supplement (ENSURE ENLIVE / ENSURE PLUS) liquid 237 mL  237 mL Oral BID BM [name], MD   237 mL at 12/26/20 1024  . gabapentin (NEURONTIN) capsule 600 mg  600 mg Oral TID [name], MD   600 mg at 12/27/20 0804  . lisinopril (ZESTRIL) tablet 5 mg  5 mg  Oral Daily [name], [name], MD   5 mg at 12/27/20 0806  . LORazepam (ATIVAN) tablet 1 mg  1 mg Oral Q6H PRN [name], MD   1 mg at 12/27/20 0809  . magnesium hydroxide (MILK OF MAGNESIA) suspension 30 mL  30 mL Oral Daily PRN [name], MD      . multivitamin with minerals tablet 1 tablet  1 tablet Oral Daily [name], MD   1 tablet at 12/27/20 0804  . nitroGLYCERIN (NITROSTAT) SL tablet 0.4 mg  0.4 mg Sublingual Q5 min PRN [name], MD      . olopatadine (PATANOL) 0.1 % ophthalmic solution 1 drop  1 drop Both Eyes Q6H PRN [name], [name], MD      . phenylephrine-shark liver oil-mineral oil-petrolatum (PREPARATION H) rectal ointment 1 application  1 application Rectal BID PRN [name], MD   1 application at 96/22/29 1239  . polyethylene glycol (MIRALAX / GLYCOLAX) packet 17 g  17 g Oral Daily [name], MD   17 g at 12/27/20 0803  . rosuvastatin (CRESTOR) tablet 40 mg  40 mg Oral Daily [name], MD   40 mg at 12/27/20 0805  . senna-docusate (Senokot-S) tablet 2 tablet  2 tablet Oral Daily [name], MD   2 tablet at 12/27/20 0807    Lab Results:  Results for orders placed or performed during the hospital encounter of 12/16/20 (from the past 48 hour(s))  CBC     Status: Abnormal   Collection Time: 12/26/20  3:17 PM  Result Value Ref Range   WBC 6.5 4.0 - 10.5 K/uL   RBC 3.69 (L) 4.22 - 5.81 MIL/uL   Hemoglobin 11.7 (L) 13.0 - 17.0 g/dL    HCT 37.2 (L) 39.0 - 52.0 %   MCV 100.8 (H) 80.0 - 100.0 fL   MCH 31.7 26.0 - 34.0 pg   MCHC 31.5 30.0 - 36.0 g/dL   RDW 13.7 11.5 - 15.5 %   Platelets 152 150 - 400 K/uL   nRBC 0.0 0.0 - 0.2 %    Comment: Performed at [facility], [address] [place], [place] [address]    Blood Alcohol level:  Lab Results  Component Value Date   ETH 16 (H) 12/11/2020   ETH <10 11/94/1740    Metabolic Disorder Labs: Lab Results  Component Value Date   HGBA1C 6.4 (H) 09/16/2020   MPG 137 09/16/2020   No results found for: PROLACTIN Lab Results  Component Value Date   CHOL 108 09/05/2020   TRIG 76 09/05/2020   HDL 44 09/05/2020   CHOLHDL 2.5 09/05/2020   LDLCALC 48 09/05/2020   LDLCALC 95 04/02/2015    Physical Findings: AIMS: Facial and Oral Movements Muscles of Facial Expression: None, normal Lips and Perioral Area: None, normal Jaw: None, normal Tongue: None, normal,Extremity Movements Upper (arms, wrists, hands, fingers): None, normal Lower (legs, knees, ankles, toes): None, normal, Trunk Movements Neck, shoulders, hips: None, normal, Overall Severity Severity of abnormal movements (highest score from questions above): None, normal Incapacitation due to abnormal movements: None, normal Patient's awareness of abnormal movements (rate only patient's report): No Awareness, Dental Status Current problems with teeth and/or dentures?: No Does patient usually wear dentures?: No  CIWA:    COWS:     Musculoskeletal: Strength & Muscle Tone: decreased Gait & Station: ambulated with walker Patient leans: Front  Psychiatric Specialty Exam: Physical Exam Vitals and nursing note  reviewed.  Constitutional:      Appearance: Normal appearance.  HENT:     Head: Normocephalic and atraumatic.     Right Ear: External ear normal.     Left Ear: External ear normal.     Nose: Nose normal.     Mouth/Throat:     Mouth: Mucous membranes are moist.     Pharynx: Oropharynx is  clear.  Eyes:     Extraocular Movements: Extraocular movements intact.     Conjunctiva/sclera: Conjunctivae normal.     Pupils: Pupils are equal, round, and reactive to light.  Cardiovascular:     Rate and Rhythm: Normal rate and regular rhythm.     Pulses: Normal pulses.  Pulmonary:     Effort: Pulmonary effort is normal.  Abdominal:     General: Abdomen is flat.     Palpations: Abdomen is soft.  Musculoskeletal:        General: Deformity present. No swelling.     Cervical back: Normal range of motion and neck supple.  Skin:    General: Skin is warm and dry.  Neurological:     General: No focal deficit present.     Mental Status: He is alert and oriented to person, place, and time.  Psychiatric:        Attention and Perception: Attention and perception normal.        Mood and Affect: Mood is depressed. Affect is flat.        Speech: Speech normal.        Behavior: Behavior is withdrawn.        Thought Content: Thought content does not include suicidal ideation. Thought content does not include suicidal plan.        Cognition and Memory: Cognition and memory normal.        Judgment: Judgment normal.     Review of Systems  Constitutional: Positive for fatigue. Negative for appetite change.  HENT: Negative for rhinorrhea and sore throat.   Eyes: Negative for photophobia and visual disturbance.  Respiratory: Negative for cough and shortness of breath.   Cardiovascular: Negative for chest pain and palpitations.  Gastrointestinal: Positive for constipation. Negative for diarrhea, nausea and vomiting.  Endocrine: Negative for cold intolerance and heat intolerance.  Genitourinary: Negative for difficulty urinating and dysuria.  Musculoskeletal: Positive for arthralgias, back pain and myalgias.  Skin: Negative for rash and wound.  Allergic/Immunologic: Negative for food allergies and immunocompromised state.  Neurological: Positive for weakness. Negative for dizziness and  headaches.  Psychiatric/Behavioral: Positive for dysphoric mood. Negative for suicidal ideas. The patient is nervous/anxious.     Blood pressure (!) 145/87, pulse (!) 106, temperature (!) 97.5 F (36.4 C), temperature source Oral, resp. rate 18, height 5\' 11"  (1.803 m), weight 98 kg, SpO2 98 %.Body mass index is 30.13 kg/m.  General Appearance: Hospital attair  Eye Contact:  good  Speech:  Clear and Coherent  Volume:  Decreased  Mood better  Affect:  Congruent  Thought Process:  Coherent and Linear  Orientation:  Full (Time, Place, and Person)  Thought Content:  Logical  Suicidal Thoughts:  No  Homicidal Thoughts:  No  Memory:  Immediate;   Fair Recent;   Fair Remote;   Fair  Judgement:  Intact  Insight:  Shallow  Psychomotor Activity:  Decreased  Concentration:  good  Recall:  good  Fund of Knowledge:  Fair  Language:  Fair  Akathisia:  Negative  Handed:  Right  AIMS (if indicated):  Assets:  Communication Skills Desire for Improvement Financial Resources/Insurance Housing Intimacy  ADL's:  Intact  Cognition:  WNL  Sleep:  Number of Hours: 9     Treatment Plan Summary: Daily contact with patient to assess and evaluate symptoms and progress in treatment and Medication management PLAN OF CARE:70 year old male with history of MDD and GAD presenting after intentional ingestion of Prozac and Remeron resulting in serotonin syndrome and medical admission. At this time patient continues to endorse depressed mood, poor sleep, low energy, anhedonia, hopelessness, helplessness, worthlessness, and passive suicidal ideations. Continue Buproprion XL 300 mg daily for depression, continue gabapentin to 600 mg TID for pain and anxiety. Ativan PRN for anxiety.PT consult completed, and appreciate recommendations. Home Health PT order completed.   1/29 No changes  Rulon Sera, MD 12/27/2020, 10:10 AM

## 2020-12-27 NOTE — BHH Group Notes (Signed)
LCSW Group Therapy Note  12/27/2020 1:34 PM  Type of Therapy and Topic:  Group Therapy: Avoiding Self-Sabotaging and Enabling Behaviors  Participation Level:  Did Not Attend   Description of Group:   In this group, patients will learn how to identify obstacles, self-sabotaging and enabling behaviors, as well as: what are they, why do we do them and what needs these behaviors meet. Discuss unhealthy relationships and how to have positive healthy boundaries with those that sabotage and enable. Explore aspects of self-sabotage and enabling in yourself and how to limit these self-destructive behaviors in everyday life.   Therapeutic Goals: 1. Patient will identify one obstacle that relates to self-sabotage and enabling behaviors 2. Patient will identify one personal self-sabotaging or enabling behavior they did prior to admission 3. Patient will state a plan to change the above identified behavior 4. Patient will demonstrate ability to communicate their needs through discussion and/or role play.   Summary of Patient Progress: Patient did not attend group despite encouraged participation.   Therapeutic Modalities:   Cognitive Behavioral Therapy Person-Centered Therapy Motivational Interviewing   Paulla Dolly, MSW, [place], [place] 12/27/2020 1:34 PM

## 2020-12-27 NOTE — Care Management Important Message (Signed)
Important Message  Patient Details  Name: Jeremy Reese MRN: [id number] Date of Birth: 1951-08-24   Medicare Important Message Given:  Yes  [employer] Appeal Detailed Notice of Discharge letter created and saved: Yes Detailed Notice of Discharge Document Given to [place]: Yes [employer] ROI Document Created: Yes [employer] appeal documents uploaded to [employer] stite: Yes   Jeremy Reese Jeremy Reese 12/27/2020, 11:02 AM

## 2020-12-27 NOTE — Plan of Care (Signed)
Patient got up for breakfast an medications. Cooperative and denying thoughts of self-harm. Denying hallucinations. Patient ate breakfast, received medications and went to his room to perform hygiene. Currently in bed resting with no sign of distress. Safety monitored as expected.

## 2020-12-28 NOTE — Progress Notes (Addendum)
Received message from [facility] with  [place] concerning hospital Appeal. Patient has lost the hospital appeal and today will be the last covered day of hospital stay. Legrand Como LCSW made aware. Carlis Stable Transition of Care Supervisor [phone]

## 2020-12-28 NOTE — BHH Counselor (Addendum)
CSW met with patient in his room to notify him of his appeal status. Assigned nurse Marla Roe, RN was present during the encounter. Patient was informed of financial liability beginning 12/29/2020 1200. Patient acknowledged and provided verbal consent for CSW to notify spouse.   Signed:  Durenda Hurt, MSW, [place], LCASA 12/28/2020 1:12 PM   Patient provided [facility] letter containing detailed justification for decision.   Signed:  Durenda Hurt, MSW, [place], LCASA 12/28/2020 1:54 PM   CSW spoke with patient's spouse with written consent of patient in order to update her on situation. Spouse reported that she can pick up the patient around 10:00am and requested prescriptions be sent to [facility] pharmacy.   Signed:  Durenda Hurt, MSW, [place], LCASA 12/28/2020 2:05 PM

## 2020-12-28 NOTE — Progress Notes (Signed)
Patient came up to request his medication at hs. Got Tylenol and Ativan. Denies SI, HI and AVH

## 2020-12-28 NOTE — BHH Counselor (Signed)
CSW discussed partial hospitalization programs with patient, provided purpose and details of program. Patient reported that he was not interested in these services, however, expressed interest in ECT treatment. CSW encouraged patient to discuss it with physician in the morning prior to discharge.   Signed:  Durenda Hurt, MSW, [place], LCASA 12/28/2020 2:39 PM

## 2020-12-28 NOTE — Progress Notes (Signed)
  [facility] Adult Case Management Discharge Plan :  Will you be returning to the same living situation after discharge:  Yes,  Patient to return to place of residence  At discharge, do you have transportation home?: Yes,  Patient's spouse to assist with transportation.  Do you have the ability to pay for your medications: Yes,  [employer] Medicare   Release of information consent forms completed and in the chart;  Patient's signature needed at discharge.  Patient to Follow up at:  [facility].   Specialty: Home Health Services Why: Services will be rendered within 48 hours of discharge, weather permitting. Agency will call patient upon discharge.  Contact information: [address] [place] [address] [phone]        unlisted Follow up.   Why: Patient provided description and contact information for outpatient therapists in [place] and surrounding areas.  Contact information: unlisted              Next level of care provider has access to [place] and Suicide Prevention discussed: Yes,  SPE completed with patient and spouse.   Have you used any form of tobacco in the last 30 days? (Cigarettes, Smokeless Tobacco, Cigars, and/or Pipes): No  Has patient been referred to the Quitline?: Patient refused referral  Patient has been referred for addiction treatment: N/A  Durenda Hurt, LCSWA 12/28/2020, 2:40 PM

## 2020-12-28 NOTE — Plan of Care (Signed)
  Problem: Education: Goal: Utilization of techniques to improve thought processes will improve Outcome: Progressing Goal: Knowledge of the prescribed therapeutic regimen will improve Outcome: Progressing   Problem: Activity: Goal: Interest or engagement in leisure activities will improve Outcome: Progressing Goal: Imbalance in normal sleep/wake cycle will improve Outcome: Progressing   Problem: Coping: Goal: Coping ability will improve Outcome: Progressing Goal: Will verbalize feelings Outcome: Progressing   Problem: Health Behavior/Discharge Planning: Goal: Ability to make decisions will improve Outcome: Progressing Goal: Compliance with therapeutic regimen will improve Outcome: Progressing   Problem: Role Relationship: Goal: Will demonstrate positive changes in social behaviors and relationships Outcome: Progressing   Problem: Safety: Goal: Ability to disclose and discuss suicidal ideas will improve Outcome: Progressing Goal: Ability to identify and utilize support systems that promote safety will improve Outcome: Progressing   Problem: Self-Concept: Goal: Will verbalize positive feelings about self Outcome: Progressing Goal: Level of anxiety will decrease Outcome: Progressing   Problem: Education: Goal: Knowledge of General Education information will improve Description: Including pain rating scale, medication(s)/side effects and non-pharmacologic comfort measures Outcome: Progressing   Problem: Health Behavior/Discharge Planning: Goal: Ability to manage health-related needs will improve Outcome: Progressing   Problem: Clinical Measurements: Goal: Ability to maintain clinical measurements within normal limits will improve Outcome: Progressing Goal: Will remain free from infection Outcome: Progressing Goal: Diagnostic test results will improve Outcome: Progressing Goal: Respiratory complications will improve Outcome: Progressing Goal: Cardiovascular  complication will be avoided Outcome: Progressing   Problem: Activity: Goal: Risk for activity intolerance will decrease Outcome: Progressing   Problem: Nutrition: Goal: Adequate nutrition will be maintained Outcome: Progressing   Problem: Coping: Goal: Level of anxiety will decrease Outcome: Progressing   Problem: Elimination: Goal: Will not experience complications related to bowel motility Outcome: Progressing Goal: Will not experience complications related to urinary retention Outcome: Progressing   Problem: Pain Managment: Goal: General experience of comfort will improve Outcome: Progressing   Problem: Safety: Goal: Ability to remain free from injury will improve Outcome: Progressing   Problem: Skin Integrity: Goal: Risk for impaired skin integrity will decrease Outcome: Progressing

## 2020-12-28 NOTE — Progress Notes (Signed)
[facility] MD Progress Note  12/28/2020 9:09 AM Jeremy Reese  MRN:  [id number]   Subjective:  70 year old male with history of MDD and GAD presenting after intentional ingestion of Prozac and Remeron resulting in serotonin syndrome and medical admission. Patient had no acute events overnight, medication compliant, attending to ADLs.   1/30 The patient reports feeling well emotionally, rating his depression to be a 2out of 10 with 10 being high. Denies having panic attacks but is anxiously awaiting a decision from Medicare. Hopes that he can stay longer to regain his strength. Sleep was fair last night. Denies psychosis. No conflicts or challenges on the unit. Imagines feeling alone at home when his wife is that work during the daytime. Denies irritability and anger. No s/i, h/i.   1/29 Patient was seen in his room. Volunteers that he had some "happy thoughts push through" yesterday that pertained to his marriage, fond memories with his parents who are now deceased, and memories of home-life in general. The improvement in mood has carried forwarded to today. Tells me that he is retired and his wife is a Copywriter, advertising. Says that he is feeling better today than previous days,and surmises that perhaps his meds are starting to work. He did sleep well last night. Depression is a 5 out of 10 as his anxiety. No thoughts of suicide. Says that he is worried about money issues and bills after he gets back to home. Requests more Ensure and snacks in between meals.  Principal Problem: MDD (major depressive disorder), recurrent severe, without psychosis ([place]) Diagnosis: Principal Problem:   MDD (major depressive disorder), recurrent severe, without psychosis ([place]) Active Problems:   Hyperlipidemia   Chronic atrial fibrillation (HCC)   Chronic diastolic congestive heart failure (HCC)   Flatback syndrome of thoracolumbar region   Hypertension   Suicide attempt ([place])  Total Time spent with patient: 30  minutes  Past Psychiatric History: Patient has a past history of depression. In 1992 he had a suicide attempt by overdose. Had a hospitalization at that time. Saw Dr. Thurmond Butts for years. Much of the time diagnosed with seasonal affective disorder and took fluoxetine and used a light box in the winter. Between that time and now had not had any other major depressive episodes. No history of mania.  Past Medical History:  Past Medical History:  Diagnosis Date  . Atrial flutter, paroxysmal ([place])   . Carotid artery disease without cerebral infarction ([place])   . Coronary artery disease 2011  . Flatback syndrome of thoracolumbar region   . H/O calcium pyrophosphate deposition disease (CPPD)   . History of TIA (transient ischemic attack)   . Hyperlipidemia   . Paroxysmal A-fib ([place])   . Spinal stenosis of lumbar region without neurogenic claudication     Past Surgical History:  Procedure Laterality Date  . BILATERAL CARPAL TUNNEL RELEASE    . CARDIAC CATHETERIZATION    . CIRCUMCISION, NON-NEWBORN    . CLIPPING OF ATRIAL APPENDAGE N/A 09/18/2020   Procedure: CLIPPING OF ATRIAL APPENDAGE USING 62 ATRICURE LAA  EXCLUSION SYSTEM;  Surgeon: Ivin Poot, MD;  Location: [place];  Service: Open Heart Surgery;  Laterality: N/A;  . CORONARY ANGIOPLASTY WITH STENT PLACEMENT  2011  . CORONARY ARTERY BYPASS GRAFT N/A 09/18/2020   Procedure: CORONARY ARTERY BYPASS GRAFTING (CABG)X 3, ON PUMP, USING LEFT INTERAL MAMMARY ARTERY AND ENDOSCOPICALLY HARVESTED RIGHT GREATER SAPHENOUS VEIN. LIMA TO LAD, SVG TO OM, SVG TO PD;  Surgeon: Ivin Poot, MD;  Location: [facility];  Service: Open Heart Surgery;  Laterality: N/A;  . ENDOVEIN HARVEST OF GREATER SAPHENOUS VEIN Right 09/18/2020   Procedure: ENDOVEIN HARVEST OF GREATER SAPHENOUS VEIN;  Surgeon: Ivin Poot, MD;  Location: [place];  Service: Open Heart Surgery;  Laterality: Right;  . LEFT HEART CATH AND CORONARY ANGIOGRAPHY Left 09/12/2020   Procedure:  LEFT HEART CATH AND CORONARY ANGIOGRAPHY;  Surgeon: Minna Merritts, MD;  Location: [facility];  Service: Cardiovascular;  Laterality: Left;  . TEE WITHOUT CARDIOVERSION N/A 09/18/2020   Procedure: TRANSESOPHAGEAL ECHOCARDIOGRAM (TEE);  Surgeon: Prescott Gum, Collier Salina, MD;  Location: [facility];  Service: Open Heart Surgery;  Laterality: N/A;   Family History:  Family History  Problem Relation Age of Onset  . Heart disease Mother 49       CABG x 3   . Hypertension Father    Family Psychiatric  History: Denies Social History:  Social History   Substance and Sexual Activity  Alcohol Use Yes   Comment: rare      Social History   Substance and Sexual Activity  Drug Use No    Social History   Socioeconomic History  . Marital status: Married    Spouse name: Not on file  . Number of children: Not on file  . Years of education: Not on file  . Highest education level: Not on file  Occupational History  . Not on file  Tobacco Use  . Smoking status: Former Smoker    Packs/day: 0.25    Years: 35.00    Pack years: 8.75    Types: Cigarettes    Quit date: 11/29/1993    Years since quitting: 27.0  . Smokeless tobacco: Never Used  Vaping Use  . Vaping Use: Never used  Substance and Sexual Activity  . Alcohol use: Yes    Comment: rare   . Drug use: No  . Sexual activity: Not on file  Other Topics Concern  . Not on file  Social History Narrative  . Not on file   Social Determinants of Health   Financial Resource Strain: Not on file  Food Insecurity: Not on file  Transportation Needs: Not on file  Physical Activity: Not on file  Stress: Not on file  Social Connections: Not on file   Additional Social History:                         Sleep: Fair  Appetite:  Fair  Current Medications: Current Facility-Administered Medications  Medication Dose Route Frequency Provider Last Rate Last Admin  . acetaminophen (TYLENOL) tablet 650 mg  650 mg Oral Q6H PRN  [name], MD   650 mg at 12/27/20 2111  . alum & mag hydroxide-simeth (MAALOX/MYLANTA) 200-200-20 MG/5ML suspension 30 mL  30 mL Oral Q4H PRN [name], MD   30 mL at 12/26/20 1009  . apixaban (ELIQUIS) tablet 5 mg  5 mg Oral BID [name], MD   5 mg at 12/28/20 [id number]  . aspirin EC tablet 81 mg  81 mg Oral Daily [name], MD   81 mg at 12/28/20 [id number]  . buPROPion (WELLBUTRIN XL) 24 hr tablet 300 mg  300 mg Oral Daily [name], MD   300 mg at 12/28/20 0813  . carvedilol (COREG) tablet 25 mg  25 mg Oral BID WC [name], MD   25 mg at 12/28/20 [id number]  . ezetimibe (ZETIA)  tablet 10 mg  10 mg Oral Daily [name], MD   10 mg at 12/28/20 1610  . feeding supplement (ENSURE ENLIVE / ENSURE PLUS) liquid 237 mL  237 mL Oral BID BM [name], MD   237 mL at 12/26/20 1024  . gabapentin (NEURONTIN) capsule 600 mg  600 mg Oral TID [name], MD   600 mg at 12/28/20 9604  . lisinopril (ZESTRIL) tablet 5 mg  5 mg Oral Daily [name], [name], MD   5 mg at 12/28/20 0835  . LORazepam (ATIVAN) tablet 1 mg  1 mg Oral Q6H PRN [name], MD   1 mg at 12/28/20 0813  . magnesium hydroxide (MILK OF MAGNESIA) suspension 30 mL  30 mL Oral Daily PRN [name], MD      . multivitamin with minerals tablet 1 tablet  1 tablet Oral Daily [name], MD   1 tablet at 12/28/20 [phone]  . nitroGLYCERIN (NITROSTAT) SL tablet 0.4 mg  0.4 mg Sublingual Q5 min PRN [name], MD      . olopatadine (PATANOL) 0.1 % ophthalmic solution 1 drop  1 drop Both Eyes Q6H PRN [name], [name], MD      . phenylephrine-shark liver oil-mineral oil-petrolatum (PREPARATION H) rectal ointment 1 application  1 application Rectal BID PRN [name], MD   1 application at 81/19/14 1800  . polyethylene glycol (MIRALAX / GLYCOLAX) packet 17 g  17 g Oral Daily [name], MD   17 g at 12/28/20 [phone]  . rosuvastatin (CRESTOR) tablet 40 mg  40 mg Oral Daily [name], MD   40 mg at 12/28/20 0810  . senna-docusate (Senokot-S) tablet 2 tablet  2 tablet Oral Daily [name], MD   2 tablet at 12/28/20 0809    Lab Results:  Results for orders placed or performed during the hospital encounter of 12/16/20 (from the past 48 hour(s))  CBC     Status: Abnormal   Collection Time: 12/26/20  3:17 PM  Result Value Ref Range   WBC 6.5 4.0 - 10.5 K/uL   RBC 3.69 (L) 4.22 - 5.81 MIL/uL   Hemoglobin 11.7 (L) 13.0 - 17.0 g/dL   HCT 37.2 (L) 39.0 - 52.0 %   MCV 100.8 (H) 80.0 - 100.0 fL   MCH 31.7 26.0 - 34.0 pg   MCHC 31.5 30.0 - 36.0 g/dL   RDW 13.7 11.5 - 15.5 %   Platelets 152 150 - 400 K/uL   nRBC 0.0 0.0 - 0.2 %    Comment: Performed at [facility], [address] [place], [place] [address]    Blood Alcohol level:  Lab Results  Component Value Date   ETH 16 (H) 12/11/2020   ETH <10 08/65/7846    Metabolic Disorder Labs: Lab Results  Component Value Date   HGBA1C 6.4 (H) 09/16/2020   MPG 137 09/16/2020   No results found for: PROLACTIN Lab Results  Component Value Date   CHOL 108 09/05/2020   TRIG 76 09/05/2020   HDL 44 09/05/2020   CHOLHDL 2.5 09/05/2020   LDLCALC 48 09/05/2020   LDLCALC 95 04/02/2015    Physical Findings: AIMS: Facial and Oral Movements Muscles of Facial Expression: None, normal Lips and Perioral Area: None, normal Jaw: None, normal Tongue: None, normal,Extremity Movements Upper (arms, wrists, hands, fingers): None, normal Lower (legs, knees, ankles, toes): None, normal, Trunk Movements Neck, shoulders, hips: None, normal,  Overall Severity Severity of abnormal movements (highest score from questions above): None, normal Incapacitation due to abnormal movements: None, normal Patient's awareness of abnormal movements (rate only patient's report): No Awareness, Dental Status Current problems with teeth and/or dentures?: No Does patient usually wear dentures?: No  CIWA:    COWS:      Musculoskeletal: Strength & Muscle Tone: decreased Gait & Station: ambulated with walker Patient leans: Front  Psychiatric Specialty Exam: Physical Exam Vitals and nursing note reviewed.  Constitutional:      Appearance: Normal appearance.  HENT:     Head: Normocephalic and atraumatic.     Right Ear: External ear normal.     Left Ear: External ear normal.     Nose: Nose normal.     Mouth/Throat:     Mouth: Mucous membranes are moist.     Pharynx: Oropharynx is clear.  Eyes:     Extraocular Movements: Extraocular movements intact.     Conjunctiva/sclera: Conjunctivae normal.     Pupils: Pupils are equal, round, and reactive to light.  Cardiovascular:     Rate and Rhythm: Normal rate and regular rhythm.     Pulses: Normal pulses.  Pulmonary:     Effort: Pulmonary effort is normal.  Abdominal:     General: Abdomen is flat.     Palpations: Abdomen is soft.  Musculoskeletal:        General: Deformity present. No swelling.     Cervical back: Normal range of motion and neck supple.  Skin:    General: Skin is warm and dry.  Neurological:     General: No focal deficit present.     Mental Status: He is alert and oriented to person, place, and time.  Psychiatric:        Attention and Perception: Attention and perception normal.        Mood and Affect: Mood is depressed. Affect is flat.        Speech: Speech normal.        Behavior: Behavior is withdrawn.        Thought Content: Thought content does not include suicidal ideation. Thought content does not include suicidal plan.        Cognition and Memory: Cognition and memory normal.        Judgment: Judgment normal.       Blood pressure (!) 160/105, pulse 82, temperature (!) 97.5 F (36.4 C), temperature source Oral, resp. rate 18, height 5\' 11"  (1.803 m), weight 98 kg, SpO2 100 %.Body mass index is 30.13 kg/m.  General Appearance: Hospital attair  Eye Contact:  good  Speech:  Clear and Coherent  Volume:  wnl  Mood  good  Affect:  Congruent  Thought Process:  Coherent and Linear  Orientation:  Full (Time, Place, and Person)  Thought Content:  Logical  Suicidal Thoughts:  No  Homicidal Thoughts:  No  Memory:  Immediate;   Fair Recent;   Fair Remote;   Fair  Judgement:  Intact  Insight:  Shallow  Psychomotor Activity:  Decreased  Concentration:  good  Recall:  good  Fund of Knowledge:  Fair  Language:  good  Akathisia:  Negative  Handed:  Right  AIMS (if indicated):     Assets:  Communication Skills Desire for Improvement Financial Resources/Insurance Housing Intimacy  ADL's:  Intact  Cognition:  WNL  Sleep:  Number of Hours: 9     Treatment Plan Summary: Daily contact with patient to assess and evaluate symptoms and progress  in treatment and Medication management PLAN OF CARE:70 year old male with history of MDD and GAD presenting after intentional ingestion of Prozac and Remeron resulting in serotonin syndrome and medical admission. At this time patient continues to endorse depressed mood, poor sleep, low energy, anhedonia, hopelessness, helplessness, worthlessness, and passive suicidal ideations. Continue Buproprion XL 300 mg daily for depression, continue gabapentin to 600 mg TID for pain and anxiety. Ativan PRN for anxiety.PT consult completed, and appreciate recommendations. Home Health PT order completed.   1/29 No changes  1/30 Awaiting decision for Medicare-Appeal.   Rulon Sera, MD 12/28/2020, 9:09 AM

## 2020-12-28 NOTE — Progress Notes (Signed)
Pt presents in a pleasant mood. He reports sleeping and eating well. He denies SI, HI, and AVH. He reports his anxiety to be a 4 or 5 and requests medication for it. Pt remains isolative to his room, but comes out for meals.  Medications were given per MD orders. Support and encouragement was provided. Pt remains safe on the unit at this time. Q15 min safety checks are maintained.

## 2020-12-28 NOTE — [facility] Group Notes (Signed)
LCSW Group Therapy Note  12/28/2020 4:05 PM  Type of Therapy/Topic:  Group Therapy:  Feelings about Diagnosis  Participation Level:  Did Not Attend   Description of Group:   This group will allow patients to explore their thoughts and feelings about diagnoses they have received. Patients will be guided to explore their level of understanding and acceptance of these diagnoses. Facilitator will encourage patients to process their thoughts and feelings about the reactions of others to their diagnosis and will guide patients in identifying ways to discuss their diagnosis with significant others in their lives. This group will be process-oriented, with patients participating in exploration of their own experiences, giving and receiving support, and processing challenge from other group members.   Therapeutic Goals: 1. Patient will demonstrate understanding of diagnosis as evidenced by identifying two or more symptoms of the disorder 2. Patient will be able to express two feelings regarding the diagnosis 3. Patient will demonstrate their ability to communicate their needs through discussion and/or role play  Summary of Patient Progress: Patient did not attend group despite encouraged participation.     Therapeutic Modalities:   Cognitive Behavioral Therapy Brief Therapy Feelings Identification   Paulla Dolly, MSW, [place], [place] 12/28/2020 4:05 PM

## 2020-12-28 NOTE — Progress Notes (Signed)
Pt was observed to be walking around on the unit without his walker. He states he wants to stretch his leg. He was instructed to continue using the walker.

## 2020-12-28 NOTE — Tx Team (Addendum)
Interdisciplinary Treatment and Diagnostic Plan Update  12/28/2020 Time of Session: 8:30AM Jeremy Reese MRN: [id number]  Principal Diagnosis: MDD (major depressive disorder), recurrent severe, without psychosis ([place])  Secondary Diagnoses: Principal Problem:   MDD (major depressive disorder), recurrent severe, without psychosis ([place]) Active Problems:   Hyperlipidemia   Chronic atrial fibrillation ([place])   Chronic diastolic congestive heart failure ([place])   Flatback syndrome of thoracolumbar region   Hypertension   Suicide attempt [facility])   Current Medications:  Current Facility-Administered Medications  Medication Dose Route Frequency Provider Last Rate Last Admin  . acetaminophen (TYLENOL) tablet 650 mg  650 mg Oral Q6H PRN [name], MD   650 mg at 12/27/20 2111  . alum & mag hydroxide-simeth (MAALOX/MYLANTA) 200-200-20 MG/5ML suspension 30 mL  30 mL Oral Q4H PRN [name], MD   30 mL at 12/26/20 1009  . apixaban (ELIQUIS) tablet 5 mg  5 mg Oral BID [name], MD   5 mg at 12/28/20 [id number]  . aspirin EC tablet 81 mg  81 mg Oral Daily [name], MD   81 mg at 12/28/20 [id number]  . buPROPion (WELLBUTRIN XL) 24 hr tablet 300 mg  300 mg Oral Daily [name], MD   300 mg at 12/28/20 0813  . carvedilol (COREG) tablet 25 mg  25 mg Oral BID WC [name], MD   25 mg at 12/28/20 [id number]  . ezetimibe (ZETIA) tablet 10 mg  10 mg Oral Daily [name], MD   10 mg at 12/28/20 [id number]  . feeding supplement (ENSURE ENLIVE / ENSURE PLUS) liquid 237 mL  237 mL Oral BID BM [name], MD   237 mL at 12/28/20 1024  . gabapentin (NEURONTIN) capsule 600 mg  600 mg Oral TID [name], MD   600 mg at 12/28/20 [id number]  . lisinopril (ZESTRIL) tablet 5 mg  5 mg Oral Daily [name], [name], MD   5 mg at 12/28/20 0835  . LORazepam (ATIVAN) tablet 1 mg  1 mg Oral Q6H PRN [name], MD   1 mg at 12/28/20 0813  . magnesium hydroxide (MILK OF MAGNESIA) suspension 30  mL  30 mL Oral Daily PRN [name], MD      . multivitamin with minerals tablet 1 tablet  1 tablet Oral Daily [name], MD   1 tablet at 12/28/20 [phone]  . nitroGLYCERIN (NITROSTAT) SL tablet 0.4 mg  0.4 mg Sublingual Q5 min PRN [name], MD      . olopatadine (PATANOL) 0.1 % ophthalmic solution 1 drop  1 drop Both Eyes Q6H PRN [name], [name], MD      . phenylephrine-shark liver oil-mineral oil-petrolatum (PREPARATION H) rectal ointment 1 application  1 application Rectal BID PRN [name], MD   1 application at [id number]  . polyethylene glycol (MIRALAX / GLYCOLAX) packet 17 g  17 g Oral Daily [name], MD   17 g at 12/28/20 [phone]  . rosuvastatin (CRESTOR) tablet 40 mg  40 mg Oral Daily [name], MD   40 mg at 12/28/20 0810  . senna-docusate (Senokot-S) tablet 2 tablet  2 tablet Oral Daily [name], MD   2 tablet at 12/28/20 0809   PTA Medications: Medications Prior to Admission  Medication Sig Dispense Refill Last Dose  . aspirin EC 81 MG tablet Take 1 tablet (81 mg total) by mouth daily. 90 tablet 3   .  carvedilol (COREG) 25 MG tablet Take 1 tablet (25 mg total) by mouth 2 (two) times daily with a meal. 60 tablet 3   . ELIQUIS 5 MG TABS tablet TAKE ONE TABLET TWICE DAILY 60 tablet 1   . ezetimibe (ZETIA) 10 MG tablet TAKE ONE (1) TABLET EACH DAY 30 tablet 3   . gabapentin (NEURONTIN) 400 MG capsule Take 400 mg by mouth 3 (three) times daily.      . nitroGLYCERIN (NITROSTAT) 0.4 MG SL tablet Place 1 tablet (0.4 mg total) under the tongue every 5 (five) minutes as needed for chest pain. 25 tablet 3   . QC STOOL SOFTENER PLS LAXATIVE 8.6-50 MG tablet Take 2 tablets by mouth daily.     . rosuvastatin (CRESTOR) 40 MG tablet Take 1 tablet (40 mg total) by mouth daily. 90 tablet 3     Patient Stressors: Financial difficulties Health problems  Patient Strengths: Motivation for treatment/growth Supportive family/friends  Treatment  Modalities: Medication Management, Group therapy, Case management,  1 to 1 session with clinician, Psychoeducation, Recreational therapy.   Physician Treatment Plan for Primary Diagnosis: MDD (major depressive disorder), recurrent severe, without psychosis ([place]) Long Term Goal(s): Improvement in symptoms so as ready for discharge Improvement in symptoms so as ready for discharge   Short Term Goals: Ability to identify changes in lifestyle to reduce recurrence of condition will improve Ability to verbalize feelings will improve Ability to disclose and discuss suicidal ideas Ability to demonstrate self-control will improve Ability to identify and develop effective coping behaviors will improve Compliance with prescribed medications will improve Ability to identify changes in lifestyle to reduce recurrence of condition will improve Ability to verbalize feelings will improve Ability to disclose and discuss suicidal ideas Ability to demonstrate self-control will improve Ability to identify and develop effective coping behaviors will improve Compliance with prescribed medications will improve  Medication Management: Evaluate patient's response, side effects, and tolerance of medication regimen.  Therapeutic Interventions: 1 to 1 sessions, Unit Group sessions and Medication administration.  Evaluation of Outcomes: Adequate for Discharge  Physician Treatment Plan for Secondary Diagnosis: Principal Problem:   MDD (major depressive disorder), recurrent severe, without psychosis ([place]) Active Problems:   Hyperlipidemia   Chronic atrial fibrillation ([facility])   Chronic diastolic congestive heart failure ([facility])   Flatback syndrome of thoracolumbar region   Hypertension   Suicide attempt ([place])  Long Term Goal(s): Improvement in symptoms so as ready for discharge Improvement in symptoms so as ready for discharge   Short Term Goals: Ability to identify changes in lifestyle to reduce recurrence of  condition will improve Ability to verbalize feelings will improve Ability to disclose and discuss suicidal ideas Ability to demonstrate self-control will improve Ability to identify and develop effective coping behaviors will improve Compliance with prescribed medications will improve Ability to identify changes in lifestyle to reduce recurrence of condition will improve Ability to verbalize feelings will improve Ability to disclose and discuss suicidal ideas Ability to demonstrate self-control will improve Ability to identify and develop effective coping behaviors will improve Compliance with prescribed medications will improve     Medication Management: Evaluate patient's response, side effects, and tolerance of medication regimen.  Therapeutic Interventions: 1 to 1 sessions, Unit Group sessions and Medication administration.  Evaluation of Outcomes: Adequate for Discharge   RN Treatment Plan for Primary Diagnosis: MDD (major depressive disorder), recurrent severe, without psychosis ([place]) Long Term Goal(s): Knowledge of disease and therapeutic regimen to maintain health will improve  Short Term Goals:  Ability to demonstrate self-control, Ability to participate in decision making will improve, Ability to verbalize feelings will improve, Ability to disclose and discuss suicidal ideas, Ability to identify and develop effective coping behaviors will improve and Compliance with prescribed medications will improve  Medication Management: RN will administer medications as ordered by provider, will assess and evaluate patient's response and provide education to patient for prescribed medication. RN will report any adverse and/or side effects to prescribing provider.  Therapeutic Interventions: 1 on 1 counseling sessions, Psychoeducation, Medication administration, Evaluate responses to treatment, Monitor vital signs and CBGs as ordered, Perform/monitor CIWA, COWS, AIMS and Fall Risk screenings  as ordered, Perform wound care treatments as ordered.  Evaluation of Outcomes: Adequate for Discharge   LCSW Treatment Plan for Primary Diagnosis: MDD (major depressive disorder), recurrent severe, without psychosis ([place]) Long Term Goal(s): Safe transition to appropriate next level of care at discharge, Engage patient in therapeutic group addressing interpersonal concerns.  Short Term Goals: Engage patient in aftercare planning with referrals and resources, Increase social support, Increase ability to appropriately verbalize feelings, Increase emotional regulation, Facilitate acceptance of mental health diagnosis and concerns and Increase skills for wellness and recovery  Therapeutic Interventions: Assess for all discharge needs, 1 to 1 time with Social worker, Explore available resources and support systems, Assess for adequacy in community support network, Educate family and significant other(s) on suicide prevention, Complete Psychosocial Assessment, Interpersonal group therapy.  Evaluation of Outcomes: Adequate for Discharge   Progress in Treatment: Attending groups: No. Participating in groups: No. Taking medication as prescribed: Yes. Toleration medication: Yes. Family/Significant other contact made: Yes, individual(s) contacted:  SPE completed with patient and wife. Patient understands diagnosis: Yes. Discussing patient identified problems/goals with staff: No. Medical problems stabilized or resolved: Yes. Denies suicidal/homicidal ideation: Yes. Issues/concerns per patient self-inventory: No. Other: none  New problem(s) identified: No, Describe:  none  New Short Term/Long Term Goal(s):  medication management for mood stabilization; elimination of SI thoughts; development of comprehensive mental wellness/sobriety plan.  Update 12/22/2020:  No changes at this time. Update: 12/27/2020 patient is adequate for d/c  Patient Goals:  Patient given the opportunity to attend treatment  team, however declined.  Update 12/22/2020:  No changes at this time. Update: 12/27/2020 patient has appealed d/c  Discharge Plan or Barriers: CSW will assist the patient in developing an appropriate discharge plan.  Update 12/22/2020:  CSW team has made referrals as indicated in PT note.  CSW has reached out to [facility] for aftercare plans. Update: 12/27/2020 patient has appealed d/c, case status "under review"   Reason for Continuation of Hospitalization: Anxiety Depression Medical Issues Medication stabilization Suicidal ideation  Estimated Length of Stay:  1-7 days  Recreational Therapy: Patient Stressors: N/A Patient Goal: Patient will engage in groups without prompting or encouragement from LRT x3 group sessions within 5 recreation therapy group sessions.  Attendees: Patient:  12/28/2020 10:43 AM  Physician: Rulon Sera, MD 12/28/2020 10:43 AM  Nursing: Marla Roe, RN 12/28/2020 10:43 AM  RN Care Manager: 12/28/2020 10:43 AM  Social Worker: Paulla Dolly, MSW, [place], [place]  12/28/2020 10:43 AM  Recreational Therapist:  12/28/2020 10:43 AM  Other:  12/28/2020 10:43 AM  Other:  12/28/2020 10:43 AM  Other: 12/28/2020 10:43 AM    Scribe for Treatment Team: Durenda Hurt, [place] 12/28/2020 10:43 AM

## 2020-12-29 LAB — CBC
HCT: 42.1 % (ref 39.0–52.0)
Hemoglobin: 13.4 g/dL (ref 13.0–17.0)
MCH: 31.9 pg (ref 26.0–34.0)
MCHC: 31.8 g/dL (ref 30.0–36.0)
MCV: 100.2 fL — ABNORMAL HIGH (ref 80.0–100.0)
Platelets: 155 10*3/uL (ref 150–400)
RBC: 4.2 MIL/uL — ABNORMAL LOW (ref 4.22–5.81)
RDW: 13.9 % (ref 11.5–15.5)
WBC: 6.2 10*3/uL (ref 4.0–10.5)
nRBC: 0 % (ref 0.0–0.2)

## 2020-12-29 MED ORDER — BUPROPION HCL ER (XL) 300 MG PO TB24: 300.0000 mg | ORAL_TABLET | Freq: Every day | ORAL | 1 refills | Status: AC

## 2020-12-29 MED ORDER — LORAZEPAM 1 MG PO TABS: 1.0000 mg | ORAL_TABLET | Freq: Two times a day (BID) | ORAL | 1 refills | Status: AC | PRN

## 2020-12-29 MED ORDER — GABAPENTIN 300 MG PO CAPS: 600.0000 mg | ORAL_CAPSULE | Freq: Three times a day (TID) | ORAL | 1 refills | Status: AC

## 2020-12-29 NOTE — Plan of Care (Signed)
  Problem: Education: Goal: Utilization of techniques to improve thought processes will improve Outcome: Progressing Goal: Knowledge of the prescribed therapeutic regimen will improve Outcome: Progressing   Problem: Activity: Goal: Interest or engagement in leisure activities will improve Outcome: Progressing Goal: Imbalance in normal sleep/wake cycle will improve Outcome: Progressing   Problem: Coping: Goal: Coping ability will improve Outcome: Progressing Goal: Will verbalize feelings Outcome: Progressing   Problem: Health Behavior/Discharge Planning: Goal: Ability to make decisions will improve Outcome: Progressing Goal: Compliance with therapeutic regimen will improve Outcome: Progressing   Problem: Role Relationship: Goal: Will demonstrate positive changes in social behaviors and relationships Outcome: Progressing   Problem: Safety: Goal: Ability to disclose and discuss suicidal ideas will improve Outcome: Progressing Goal: Ability to identify and utilize support systems that promote safety will improve Outcome: Progressing   Problem: Self-Concept: Goal: Will verbalize positive feelings about self Outcome: Progressing Goal: Level of anxiety will decrease Outcome: Progressing   Problem: Education: Goal: Knowledge of General Education information will improve Description: Including pain rating scale, medication(s)/side effects and non-pharmacologic comfort measures Outcome: Progressing   Problem: Health Behavior/Discharge Planning: Goal: Ability to manage health-related needs will improve Outcome: Progressing   Problem: Clinical Measurements: Goal: Ability to maintain clinical measurements within normal limits will improve Outcome: Progressing Goal: Will remain free from infection Outcome: Progressing Goal: Diagnostic test results will improve Outcome: Progressing Goal: Respiratory complications will improve Outcome: Progressing Goal: Cardiovascular  complication will be avoided Outcome: Progressing   Problem: Activity: Goal: Risk for activity intolerance will decrease Outcome: Progressing   Problem: Nutrition: Goal: Adequate nutrition will be maintained Outcome: Progressing   Problem: Coping: Goal: Level of anxiety will decrease Outcome: Progressing   Problem: Elimination: Goal: Will not experience complications related to bowel motility Outcome: Progressing Goal: Will not experience complications related to urinary retention Outcome: Progressing   Problem: Pain Managment: Goal: General experience of comfort will improve Outcome: Progressing   Problem: Safety: Goal: Ability to remain free from injury will improve Outcome: Progressing   Problem: Skin Integrity: Goal: Risk for impaired skin integrity will decrease Outcome: Progressing
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
## 2020-12-29 NOTE — Progress Notes (Signed)
  [facility] Adult Case Management Discharge Plan :  Will you be returning to the same living situation after discharge:  Yes,  pt reports that he is returning home. At discharge, do you have transportation home?: Yes,  pt reports that his wife will provide transportation. Do you have the ability to pay for your medications: Yes,  [employer] Medicare  Release of information consent forms completed and in the chart;  Patient's signature needed at discharge.  Patient to Follow up at:  [facility].   Specialty: Home Health Services Why: Services will be rendered within 48 hours of discharge, weather permitting. Agency will call patient upon discharge.  Contact information: [address] [place] [address] [phone]        unlisted Follow up.   Why: Patient provided description and contact information for outpatient therapists in [place] and surrounding areas.  Contact information: unlisted       Theora Gianotti, NP Follow up on 01/07/2021.   Specialties: Nurse Practitioner, Cardiology, Radiology Why: Appointment 2:20PM on 01/07/21 to assess cardiac clearance for ECT Contact information: [place] [address] [place] at [facility] Follow up.   Why: Hours of operation are Monday through Thursday 7AM-6PM and Friday 8AM-5pm.  Calls have been made for Partial Hospitalization Program, at time of dicharge calls have not been returned.  They will call you, if they do not please call to follow up. Thanks! Contact information: [address] [place],  [place]  [address] Main: [phone] [phone]              Next level of care provider has access to [facility] [place] and Suicide Prevention discussed: Yes,  SPE completed with the patient and patient's wife.   Have you used any form of tobacco  in the last 30 days? (Cigarettes, Smokeless Tobacco, Cigars, and/or Pipes): No  Has patient been referred to the Quitline?: Patient refused referral  Patient has been referred for addiction treatment: Pt. refused referral  Rozann Lesches, LCSW 12/29/2020, 9:12 AM

## 2020-12-29 NOTE — [facility] Counselor (Signed)
CSW reviewed with the patient the appeal decision from [facility].  Patient stated that he did not agree with the decision and asked CSW to "find a reason for me to stay, you can't help me unless you can offer me some more days".  CSW explained that she does not have that power, however, patient has the right to file a reconsideration to the appeal and reviewed the details as provided on the paperwork.  Pt reported plans to proceed with the discharge as planned.  CSW spoke with the patient again regarding aftercare.  Patient again stated that he feels weak.  CSW encouraged pt to discuss this with the physician and reminded that home health PT has been set up for pt's discharge. CSW also pointed out that patient should have a walker delivered to the unit for him to take home.  CSW asked patient to focus on mental health needs at discharge, in an effort to identify what aftercare plan pt would like.  In past patient has utilized this discussion to discuss length of stay and not identify a plan. CSW pointed out that goal is to set pt up with a good aftercare plan to reduce the patient returning to the hospital but to also provide patient with support at home.  CSW reviewed PHP program recommendation.  Patient agreed to [facility] referral. CSW informed that it is possible that patient would be discharged prior to an official appointment, however, PHP program contact information will be provided and CSW will provide the [facility] program with the patient's information.   Assunta Curtis, MSW, LCSW 12/29/2020 9:16 AM

## 2020-12-29 NOTE — Discharge Summary (Signed)
Physician Discharge Summary Note  Patient:  Jeremy Reese is an 70 y.o., male MRN:  [id number] DOB:  05/31/1951 Patient phone:  [phone] (home)  Patient address:   [place] [address],  Total Time spent with patient: 30 minutes  Date of Admission:  12/16/2020 Date of Discharge: 12/29/2020  Reason for Admission:  70 year old male with history of MDD and GAD presenting after intentional ingestion of Prozac and Remeron resulting in serotonin syndrome and medical admission.  Principal Problem: MDD (major depressive disorder), recurrent severe, without psychosis ([place]) Discharge Diagnoses: Principal Problem:   MDD (major depressive disorder), recurrent severe, without psychosis ([place]) Active Problems:   Hyperlipidemia   Chronic atrial fibrillation (HCC)   Chronic diastolic congestive heart failure (HCC)   Flatback syndrome of thoracolumbar region   Hypertension   Suicide attempt [facility])   Past Psychiatric History: Patient has a past history of depression. In 1992 he had a suicide attempt by overdose. Had a hospitalization at that time. Saw Dr. Thurmond Butts for years. Much of the time diagnosed with seasonal affective disorder and took fluoxetine and used a light box in the winter. Between that time and now had not had any other major depressive episodes. No history of mania.  Past Medical History:  Past Medical History:  Diagnosis Date  . Atrial flutter, paroxysmal ([place])   . Carotid artery disease without cerebral infarction ([place])   . Coronary artery disease 2011  . Flatback syndrome of thoracolumbar region   . H/O calcium pyrophosphate deposition disease (CPPD)   . History of TIA (transient ischemic attack)   . Hyperlipidemia   . Paroxysmal A-fib ([place])   . Spinal stenosis of lumbar region without neurogenic claudication     Past Surgical History:  Procedure Laterality Date  . BILATERAL CARPAL TUNNEL RELEASE    . CARDIAC CATHETERIZATION    . CIRCUMCISION,  NON-NEWBORN    . CLIPPING OF ATRIAL APPENDAGE N/A 09/18/2020   Procedure: CLIPPING OF ATRIAL APPENDAGE USING 51 ATRICURE LAA  EXCLUSION SYSTEM;  Surgeon: Ivin Poot, MD;  Location: [place];  Service: Open Heart Surgery;  Laterality: N/A;  . CORONARY ANGIOPLASTY WITH STENT PLACEMENT  2011  . CORONARY ARTERY BYPASS GRAFT N/A 09/18/2020   Procedure: CORONARY ARTERY BYPASS GRAFTING (CABG)X 3, ON PUMP, USING LEFT INTERAL MAMMARY ARTERY AND ENDOSCOPICALLY HARVESTED RIGHT GREATER SAPHENOUS VEIN. LIMA TO LAD, SVG TO OM, SVG TO PD;  Surgeon: Ivin Poot, MD;  Location: [place];  Service: Open Heart Surgery;  Laterality: N/A;  . ENDOVEIN HARVEST OF GREATER SAPHENOUS VEIN Right 09/18/2020   Procedure: ENDOVEIN HARVEST OF GREATER SAPHENOUS VEIN;  Surgeon: Ivin Poot, MD;  Location: [place];  Service: Open Heart Surgery;  Laterality: Right;  . LEFT HEART CATH AND CORONARY ANGIOGRAPHY Left 09/12/2020   Procedure: LEFT HEART CATH AND CORONARY ANGIOGRAPHY;  Surgeon: Minna Merritts, MD;  Location: [place] [facility];  Service: Cardiovascular;  Laterality: Left;  . TEE WITHOUT CARDIOVERSION N/A 09/18/2020   Procedure: TRANSESOPHAGEAL ECHOCARDIOGRAM (TEE);  Surgeon: Prescott Gum, Collier Salina, MD;  Location: [place];  Service: Open Heart Surgery;  Laterality: N/A;   Family History:  Family History  Problem Relation Age of Onset  . Heart disease Mother 76       CABG x 3   . Hypertension Father    Family Psychiatric  History: Denies Social History:  Social History   Substance and Sexual Activity  Alcohol Use Yes   Comment: rare  Social History   Substance and Sexual Activity  Drug Use No    Social History   Socioeconomic History  . Marital status: Married    Spouse name: Not on file  . Number of children: Not on file  . Years of education: Not on file  . Highest education level: Not on file  Occupational History  . Not on file  Tobacco Use  . Smoking status: Former Smoker     Packs/day: 0.25    Years: 35.00    Pack years: 8.75    Types: Cigarettes    Quit date: 11/29/1993    Years since quitting: 27.1  . Smokeless tobacco: Never Used  Vaping Use  . Vaping Use: Never used  Substance and Sexual Activity  . Alcohol use: Yes    Comment: rare   . Drug use: No  . Sexual activity: Not on file  Other Topics Concern  . Not on file  Social History Narrative  . Not on file   Social Determinants of Health   Financial Resource Strain: Not on file  Food Insecurity: Not on file  Transportation Needs: Not on file  Physical Activity: Not on file  Stress: Not on file  Social Connections: Not on file    Hospital Course:  70 year old male with history of MDD and GAD presenting after intentional ingestion of Prozac and Remeron resulting in serotonin syndrome and medical admission. Prozac and Remeron were discontinued on admission to medical floor, and symptoms of serotonin syndrome gradually resolved without complication. Once on the BMU, he was started on Wellbutrin and this was titrated to 300 mg daily. His gabapentin was increased to 600 mg TID for neuropathic pain and anxiety. He also was given Ativan 0.5 mg BID PRN for anxiety or sleep. His mood gradually improved and he no longer had active suicidal plans. However, he did not feel safe to leave the hospital due to continued weakness and fear of being alone Monday-Thursday while his wife was at work. Home health physical therapy and walker were ordered per inpatient physical therapy consult recommendations. He was also offered partial hospitalization, but he declined due to difficulty using technology and feeling unable to connect to Zoom appointments. He did express an interest in ECT. An appointment with cardiology was set up fro 01/07/21 at 2:20PM to assess cardiac clearance. He was also educated about [place] as an alternate modality to treat depression should he not be cleared for ECT. Patient did exercise his right to appeal  discharge, but his appeal was not granted. At time of discharge he denied suicidal ideations, homicidal ideations, visual hallucination, and auditory hallucinations. His wife was available to pick him up from the hospital today.   Physical Findings: AIMS: Facial and Oral Movements Muscles of Facial Expression: None, normal Lips and Perioral Area: None, normal Jaw: None, normal Tongue: None, normal,Extremity Movements Upper (arms, wrists, hands, fingers): None, normal Lower (legs, knees, ankles, toes): None, normal, Trunk Movements Neck, shoulders, hips: None, normal, Overall Severity Severity of abnormal movements (highest score from questions above): None, normal Incapacitation due to abnormal movements: None, normal Patient's awareness of abnormal movements (rate only patient's report): No Awareness, Dental Status Current problems with teeth and/or dentures?: No Does patient usually wear dentures?: No  CIWA:    COWS:     Musculoskeletal: Strength & Muscle Tone: within normal limits Gait & Station: ambulates with walker Patient leans: Front  Psychiatric Specialty Exam: Physical Exam Vitals and nursing note reviewed.  Constitutional:  Appearance: Normal appearance.  HENT:     Head: Normocephalic and atraumatic.     Right Ear: External ear normal.     Left Ear: External ear normal.     Nose: Nose normal.     Mouth/Throat:     Mouth: Mucous membranes are moist.     Pharynx: Oropharynx is clear.  Eyes:     Extraocular Movements: Extraocular movements intact.     Conjunctiva/sclera: Conjunctivae normal.     Pupils: Pupils are equal, round, and reactive to light.  Cardiovascular:     Rate and Rhythm: Normal rate.     Pulses: Normal pulses.  Pulmonary:     Effort: Pulmonary effort is normal.     Breath sounds: Normal breath sounds.  Abdominal:     General: Abdomen is flat.     Palpations: Abdomen is soft.  Musculoskeletal:        General: Deformity present. No  swelling.     Cervical back: Normal range of motion and neck supple.  Skin:    General: Skin is warm and dry.  Neurological:     General: No focal deficit present.     Mental Status: He is alert and oriented to person, place, and time.  Psychiatric:        Mood and Affect: Mood is depressed.        Behavior: Behavior normal.        Thought Content: Thought content does not include suicidal ideation. Thought content does not include suicidal plan.        Judgment: Judgment normal.     Review of Systems  Constitutional: Positive for fatigue. Negative for appetite change.  HENT: Negative for rhinorrhea and sore throat.   Eyes: Negative for photophobia and visual disturbance.  Respiratory: Negative for cough and shortness of breath.   Cardiovascular: Negative for chest pain and palpitations.  Gastrointestinal: Negative for constipation, diarrhea, nausea and vomiting.  Endocrine: Negative for cold intolerance and heat intolerance.  Genitourinary: Negative for difficulty urinating and dysuria.  Musculoskeletal: Positive for arthralgias and back pain.  Skin: Negative for rash and wound.  Allergic/Immunologic: Negative for food allergies and immunocompromised state.  Neurological: Positive for weakness. Negative for dizziness.  Hematological: Negative for adenopathy. Does not bruise/bleed easily.  Psychiatric/Behavioral: Positive for dysphoric mood. Negative for hallucinations and suicidal ideas. The patient is not nervous/anxious.     Blood pressure 122/77, pulse 80, temperature 98.1 F (36.7 C), temperature source Oral, resp. rate 17, height 5\' 11"  (1.803 m), weight 98 kg, SpO2 100 %.Body mass index is 30.13 kg/m.  General Appearance: Fairly Groomed  Engineer, water::  Good  Speech:  Clear and Coherent and Normal Rate  Volume:  Normal  Mood:  Depressed  Affect:  Congruent  Thought Process:  Coherent and Linear  Orientation:  Full (Time, Place, and Person)  Thought Content:  Logical   Suicidal Thoughts:  No  Homicidal Thoughts:  No  Memory:  Immediate;   Fair  Judgement:  Fair  Insight:  Fair  Psychomotor Activity:  Normal  Concentration:  Fair  Recall:  [employer] of Knowledge:Fair  Language: Fair  Akathisia:  Negative  Handed:  Right  AIMS (if indicated):     Assets:  Communication Skills Desire for Improvement Financial Resources/Insurance Housing Intimacy Leisure Time Social Support Transportation  Sleep:  Number of Hours: 9  Cognition: WNL  ADL's:  Intact        Have you used any form of tobacco in  the last 30 days? (Cigarettes, Smokeless Tobacco, Cigars, and/or Pipes): No  Has this patient used any form of tobacco in the last 30 days? (Cigarettes, Smokeless Tobacco, Cigars, and/or Pipes) No  Blood Alcohol level:  Lab Results  Component Value Date   ETH 16 (H) 12/11/2020   ETH <10 99991111    Metabolic Disorder Labs:  Lab Results  Component Value Date   HGBA1C 6.4 (H) 09/16/2020   MPG 137 09/16/2020   No results found for: PROLACTIN Lab Results  Component Value Date   CHOL 108 09/05/2020   TRIG 76 09/05/2020   HDL 44 09/05/2020   CHOLHDL 2.5 09/05/2020   LDLCALC 48 09/05/2020   [place] 95 04/02/2015    See Psychiatric Specialty Exam and Suicide Risk Assessment completed by Attending Physician prior to discharge.  Discharge destination:  Home  Is patient on multiple antipsychotic therapies at discharge:  No   Has Patient had three or more failed trials of antipsychotic monotherapy by history:  No  Recommended Plan for Multiple Antipsychotic Therapies: NA  Discharge Instructions    Diet - low sodium heart healthy   Complete by: As directed    Increase activity slowly   Complete by: As directed      Allergies as of 12/29/2020      Reactions   Propafenone Anxiety   Codeine Nausea Only   Penicillins Rash      Medication List    TAKE these medications     Indication  aspirin EC 81 MG tablet Take 1 tablet (81 mg  total) by mouth daily.  Indication: Coronary Bypass Surgery   buPROPion 300 MG 24 hr tablet Commonly known as: WELLBUTRIN XL Take 1 tablet (300 mg total) by mouth daily. Start taking on: December 30, 2020  Indication: Major Depressive Disorder, Seasonal Affective Disorder   carvedilol 25 MG tablet Commonly known as: COREG Take 1 tablet (25 mg total) by mouth 2 (two) times daily with a meal.  Indication: Atrial Fibrillation   Eliquis 5 MG Tabs tablet Generic drug: apixaban TAKE ONE TABLET TWICE DAILY  Indication: Cerebrovascular accident secondary to Atrial Fibrillation   ezetimibe 10 MG tablet Commonly known as: ZETIA TAKE ONE (1) TABLET EACH DAY  Indication: Disease involving Lipid Deposits in the Arteries   gabapentin 300 MG capsule Commonly known as: NEURONTIN Take 2 capsules (600 mg total) by mouth 3 (three) times daily. What changed:   medication strength  how much to take  Indication: Neuropathic Pain   LORazepam 1 MG tablet Commonly known as: ATIVAN Take 1 tablet (1 mg total) by mouth 2 (two) times daily as needed for anxiety or sleep. Script must last 30 days from fill date.  Indication: Feeling Anxious, Trouble Sleeping   nitroGLYCERIN 0.4 MG SL tablet Commonly known as: NITROSTAT Place 1 tablet (0.4 mg total) under the tongue every 5 (five) minutes as needed for chest pain.  Indication: Acute Angina Pectoris   QC Stool Softener Pls Laxative 8.6-50 MG tablet Generic drug: senna-docusate Take 2 tablets by mouth daily.  Indication: Constipation   rosuvastatin 40 MG tablet Commonly known as: CRESTOR Take 1 tablet (40 mg total) by mouth daily.  Indication: Disease involving Lipid Deposits in the Arteries            Durable Medical Equipment  (From admission, onward)         Start     Ordered   12/29/20 0811  For home use only DME Walker rolling  Once  Question Answer Comment  Walker: With 5 Inch Wheels   Patient needs a walker to treat with  the following condition Weakness      12/29/20 [facility] Follow up.   Specialty: Home Health Services Why: Services will be rendered within 48 hours of discharge, weather permitting. Agency will call patient upon discharge.  Contact information: [place] [address] [phone]        unlisted Follow up.   Why: Patient provided description and contact information for outpatient therapists in [place] and surrounding areas.  Contact information: unlisted       Theora Gianotti, NP Follow up on 01/07/2021.   Specialties: Nurse Practitioner, Cardiology, Radiology Why: Appointment 2:20PM on 01/07/21 to assess cardiac clearance for ECT Contact information: [place] [address] [phone]               Follow-up recommendations:  Activity:  as tolerated Diet:  low sodium heart healthy diet  Comments:  Walker provided at discharge per PT recommendations. Home health PT also set up prior to discharge. 30-day scripts with 1 refill sent to [facility] per patient request. He also noted an interest in pursuing ECT for treatment of depression as this has worked well in the past. Appointment made with cardiology 01/07/21 at 2:20PM to assess cardiac clearance for procedure. Also informed that [facility] could be an option should he not be cleared for ECT. Patient provided with handouts for psychiatrists and psychologists that accept [facility] for outpatient follow-up. He declined resources for partial hospitalization program.   Signed: [name], MD 12/29/2020, 9:07 AM

## 2020-12-29 NOTE — Progress Notes (Signed)
Patient discharged per MD order. Verbalized discharge information provided by Probation officer. Denied suicidal thoughts upon discharge.

## 2020-12-29 NOTE — Progress Notes (Signed)
Recreation Therapy Notes      Date: 12/29/2020  Time: 9:30 am   Location: Craft room     Behavioral response: N/A   Intervention Topic: Problem Solving    Discussion/Intervention: Patient did not attend group.   Clinical Observations/Feedback:  Patient did not attend group.   Sorrel Cassetta LRT/CTRS          Caydn Justen 12/29/2020 11:20 AM

## 2020-12-29 NOTE — Progress Notes (Signed)
Recreation Therapy Notes  INPATIENT RECREATION TR PLAN  Patient Details Name: Jeremy Reese MRN: [id number] DOB: 1951-11-23 Today's Date: 12/29/2020  Rec Therapy Plan Treatment times per week: at least 3 Estimated Length of Stay: 5-7 days TR Treatment/Interventions: Group participation (Comment)  Discharge Criteria Pt will be discharged from therapy if:: Discharged Treatment plan/goals/alternatives discussed and agreed upon by:: Patient/family  Discharge Summary Short term goals set: Patient will successfully identify 2 ways of making healthy decisions post d/c within 5 recreation therapy group sessions Short term goals met: Complete Progress toward goals comments: Groups attended Which groups?: Self-esteem,Stress management,Goal setting,Communication Reason goals not met: N/A Therapeutic equipment acquired: N/A Reason patient discharged from therapy: Discharge from hospital Pt/family agrees with progress & goals achieved: Yes Date patient discharged from therapy: 12/29/20   Yedidya Duddy 12/29/2020, 11:23 AM

## 2020-12-29 NOTE — Plan of Care (Signed)
Patient is out of bed and visible in the milieu. Alert and oriented x4. Cooperative and reporting that he is being discharged today but also reporting that he would like to spend a couple of more days here. Denied SI/HI/AVH. Patient ate his breakfast and received AM medications. Staff offered support and encouragements. Safety monitored as expected.

## 2020-12-29 NOTE — BHH Suicide Risk Assessment (Signed)
[facility] Discharge Suicide Risk Assessment   Principal Problem: MDD (major depressive disorder), recurrent severe, without psychosis ([place]) Discharge Diagnoses: Principal Problem:   MDD (major depressive disorder), recurrent severe, without psychosis ([place]) Active Problems:   Hyperlipidemia   Chronic atrial fibrillation (HCC)   Chronic diastolic congestive heart failure (HCC)   Flatback syndrome of thoracolumbar region   Hypertension   Suicide attempt ([place])   Total Time spent with patient: 30 minutes  Musculoskeletal: Strength & Muscle Tone: within normal limits Gait & Station: ambulates with walker Patient leans: Front  Psychiatric Specialty Exam: Review of Systems  Constitutional: Positive for fatigue. Negative for appetite change.  HENT: Negative for rhinorrhea and sore throat.   Eyes: Negative for photophobia and visual disturbance.  Respiratory: Negative for cough and shortness of breath.   Cardiovascular: Negative for chest pain and palpitations.  Gastrointestinal: Negative for constipation, diarrhea, nausea and vomiting.  Endocrine: Negative for cold intolerance and heat intolerance.  Genitourinary: Negative for difficulty urinating and dysuria.  Musculoskeletal: Positive for arthralgias and back pain.  Skin: Negative for rash and wound.  Allergic/Immunologic: Negative for food allergies and immunocompromised state.  Neurological: Positive for weakness. Negative for dizziness.  Hematological: Negative for adenopathy. Does not bruise/bleed easily.  Psychiatric/Behavioral: Positive for dysphoric mood. Negative for hallucinations and suicidal ideas. The patient is not nervous/anxious.     Blood pressure 122/77, pulse 80, temperature 98.1 F (36.7 C), temperature source Oral, resp. rate 17, height 5\' 11"  (1.803 m), weight 98 kg, SpO2 100 %.Body mass index is 30.13 kg/m.  General Appearance: Fairly Groomed  Engineer, water::  Good  Speech:  Clear and Coherent and Normal Rate   Volume:  Normal  Mood:  Depressed  Affect:  Congruent  Thought Process:  Coherent and Linear  Orientation:  Full (Time, Place, and Person)  Thought Content:  Logical  Suicidal Thoughts:  No  Homicidal Thoughts:  No  Memory:  Immediate;   Fair  Judgement:  Fair  Insight:  Fair  Psychomotor Activity:  Normal  Concentration:  Fair  Recall:  [employer] of Knowledge:Fair  Language: Fair  Akathisia:  Negative  Handed:  Right  AIMS (if indicated):     Assets:  Communication Skills Desire for Improvement Financial Resources/Insurance Housing Intimacy Leisure Time Social Support Transportation  Sleep:  Number of Hours: 9  Cognition: WNL  ADL's:  Intact   Mental Status Per Nursing Assessment::   On Admission:  Self-harm behaviors  Demographic Factors:  Male, Age 69 or older and Caucasian  Loss Factors: NA  Historical Factors: Prior suicide attempts  Risk Reduction Factors:   Sense of responsibility to family, Religious beliefs about death, Living with another person, especially a relative, Positive social support, Positive therapeutic relationship and Positive coping skills or problem solving skills  Continued Clinical Symptoms:  Depression:   Recent sense of peace/wellbeing Previous Psychiatric Diagnoses and Treatments Medical Diagnoses and Treatments/Surgeries  Cognitive Features That Contribute To Risk:  None    Suicide Risk:  Mild:  Suicidal ideation of limited frequency, intensity, duration, and specificity.  There are no identifiable plans, no associated intent, mild dysphoria and related symptoms, good self-control (both objective and subjective assessment), few other risk factors, and identifiable protective factors, including available and accessible social support.   [facility] Follow up.   Specialty: Home Health Services Why: Services will be rendered within 48 hours of discharge, weather permitting.  Agency will call patient  upon discharge.  Contact information: [place] [address] [phone]        unlisted Follow up.   Why: Patient provided description and contact information for outpatient therapists in [place] and surrounding areas.  Contact information: unlisted       Theora Gianotti, NP Follow up on 01/07/2021.   Specialties: Nurse Practitioner, Cardiology, Radiology Why: Appointment 2:20PM on 01/07/21 to assess cardiac clearance for ECT Contact information: [place] [address] [phone]               Plan Of Care/Follow-up recommendations:  Activity:  as tolerated Diet:  low sodium heart healthy diet  [name], MD 12/29/2020, 9:04 AM

## 2020-12-29 NOTE — Plan of Care (Signed)
  Problem: Decision Making Goal: STG - Patient will successfully identify 2 ways of making healthy decisions post d/c within 5 recreation therapy group sessions Description: STG - Patient will successfully identify 2 ways of making healthy decisions post d/c within 5 recreation therapy group sessions Outcome: Completed/Met

## 2020-12-30 ENCOUNTER — Telehealth (HOSPITAL_COMMUNITY): Payer: Self-pay | Admitting: Licensed Clinical Social Worker

## 2021-01-07 ENCOUNTER — Ambulatory Visit: Payer: Medicare HMO | Admitting: Nurse Practitioner

## 2021-01-13 ENCOUNTER — Encounter: Payer: Self-pay | Admitting: Internal Medicine

## 2021-02-23 ENCOUNTER — Encounter: Payer: Self-pay | Admitting: *Deleted

## 2021-03-04 ENCOUNTER — Encounter: Payer: Self-pay | Admitting: Internal Medicine

## 2021-03-04 ENCOUNTER — Telehealth: Payer: Self-pay | Admitting: *Deleted

## 2021-03-04 ENCOUNTER — Other Ambulatory Visit: Payer: Self-pay

## 2021-03-04 ENCOUNTER — Ambulatory Visit: Payer: Medicare HMO | Admitting: Internal Medicine

## 2021-03-04 VITALS — BP 130/88 | HR 102 | Ht 65.25 in | Wt 246.2 lb

## 2021-03-04 DIAGNOSIS — K649 Unspecified hemorrhoids: Secondary | ICD-10-CM

## 2021-03-04 DIAGNOSIS — Z1211 Encounter for screening for malignant neoplasm of colon: Secondary | ICD-10-CM

## 2021-03-04 DIAGNOSIS — K5909 Other constipation: Secondary | ICD-10-CM | POA: Diagnosis not present

## 2021-03-04 DIAGNOSIS — Z7901 Long term (current) use of anticoagulants: Secondary | ICD-10-CM | POA: Diagnosis not present

## 2021-03-04 MED ORDER — SUTAB 1479-225-188 MG PO TABS: ORAL_TABLET | ORAL | 0 refills | Status: AC

## 2021-03-04 NOTE — Progress Notes (Signed)
Patient ID: Jeremy Reese, male   DOB: 1951/07/16, 70 y.o.   MRN: [id number] HPI: Jeremy Reese is a 70 year old male with a past medical history of A. fib/flutter, prior TIA, on Eliquis, CAD status post three-vessel CABG in October 2021, scoliosis and spinal stenosis/disc disease, anxiety and depression who is seen to evaluate constipation and for colorectal cancer screening.  He is here alone today.  He reports that he has had constipation may be more noticeable over the last 6 months but present for years.  He is using MiraLAX on a regular basis and this has improved constipation.  Previously he was having a bowel movement about every 3 days and with MiraLAX he has a stool every day.  Bowel movements improve if he eats high-fiber foods like spinach and pinto beans.  Stools for the most part are semisoft.  He does have intermittent issues with hemorrhoids with itching and burning.  He will occasionally see scant blood with wiping.  No abdominal pain.  He has never had a colonoscopy.  Paternal grandmother had colon cancer but he notes that this was caught early and she lived into her 29s.  He takes Eliquis which she has been on for approximately 10 years after having a TIA.  Denies upper GI hepatobiliary complaint  Past Medical History:  Diagnosis Date  . Atrial flutter, paroxysmal ([place])   . Carotid artery disease without cerebral infarction ([place])   . Coronary artery disease 2011  . Flatback syndrome of thoracolumbar region   . H/O calcium pyrophosphate deposition disease (CPPD)   . History of TIA (transient ischemic attack)   . Hyperlipidemia   . Paroxysmal A-fib ([place])   . Spinal stenosis of lumbar region without neurogenic claudication     Past Surgical History:  Procedure Laterality Date  . BILATERAL CARPAL TUNNEL RELEASE    . CARDIAC CATHETERIZATION    . CIRCUMCISION, NON-NEWBORN    . CLIPPING OF ATRIAL APPENDAGE N/A 09/18/2020   Procedure: CLIPPING OF ATRIAL APPENDAGE USING  1 ATRICURE LAA  EXCLUSION SYSTEM;  Surgeon: Ivin Poot, MD;  Location: [facility];  Service: Open Heart Surgery;  Laterality: N/A;  . CORONARY ANGIOPLASTY WITH STENT PLACEMENT  2011  . CORONARY ARTERY BYPASS GRAFT N/A 09/18/2020   Procedure: CORONARY ARTERY BYPASS GRAFTING (CABG)X 3, ON PUMP, USING LEFT INTERAL MAMMARY ARTERY AND ENDOSCOPICALLY HARVESTED RIGHT GREATER SAPHENOUS VEIN. LIMA TO LAD, SVG TO OM, SVG TO PD;  Surgeon: Ivin Poot, MD;  Location: [facility];  Service: Open Heart Surgery;  Laterality: N/A;  . ENDOVEIN HARVEST OF GREATER SAPHENOUS VEIN Right 09/18/2020   Procedure: ENDOVEIN HARVEST OF GREATER SAPHENOUS VEIN;  Surgeon: Ivin Poot, MD;  Location: [facility];  Service: Open Heart Surgery;  Laterality: Right;  . LEFT HEART CATH AND CORONARY ANGIOGRAPHY Left 09/12/2020   Procedure: LEFT HEART CATH AND CORONARY ANGIOGRAPHY;  Surgeon: Minna Merritts, MD;  Location: [facility];  Service: Cardiovascular;  Laterality: Left;  . TEE WITHOUT CARDIOVERSION N/A 09/18/2020   Procedure: TRANSESOPHAGEAL ECHOCARDIOGRAM (TEE);  Surgeon: Prescott Gum, Collier Salina, MD;  Location: [facility];  Service: Open Heart Surgery;  Laterality: N/A;    Outpatient Medications Prior to Visit  Medication Sig Dispense Refill  . aspirin EC 81 MG tablet Take 1 tablet (81 mg total) by mouth daily. 90 tablet 3  . buPROPion (WELLBUTRIN XL) 300 MG 24 hr tablet Take 1 tablet (300 mg total) by mouth daily. 30 tablet 1  . carvedilol (COREG) 25  MG tablet Take 1 tablet (25 mg total) by mouth 2 (two) times daily with a meal. 60 tablet 3  . ELIQUIS 5 MG TABS tablet TAKE ONE TABLET TWICE DAILY 60 tablet 1  . ezetimibe (ZETIA) 10 MG tablet TAKE ONE (1) TABLET EACH DAY 30 tablet 3  . gabapentin (NEURONTIN) 300 MG capsule Take 2 capsules (600 mg total) by mouth 3 (three) times daily. 180 capsule 1  . LORazepam (ATIVAN) 1 MG tablet Take 1 tablet (1 mg total) by mouth 2 (two) times daily as needed for anxiety or sleep. Script  must last 30 days from fill date. 60 tablet 1  . nitroGLYCERIN (NITROSTAT) 0.4 MG SL tablet Place 1 tablet (0.4 mg total) under the tongue every 5 (five) minutes as needed for chest pain. 25 tablet 3  . polyethylene glycol (MIRALAX / GLYCOLAX) 17 g packet Take 17 g by mouth daily.    . rosuvastatin (CRESTOR) 40 MG tablet Take 1 tablet (40 mg total) by mouth daily. 90 tablet 3  . QC STOOL SOFTENER PLS LAXATIVE 8.6-50 MG tablet Take 2 tablets by mouth daily.     No facility-administered medications prior to visit.    Allergies  Allergen Reactions  . Propafenone Anxiety  . Codeine Nausea Only  . Penicillins Rash    Family History  Problem Relation Age of Onset  . Heart disease Mother 96       CABG x 3   . Hypertension Mother   . Hypertension Father   . Heart disease Father   . Lung cancer Father   . Kidney disease Other   . Colon cancer Paternal Grandmother   . Liver disease Neg Hx   . Stomach cancer Neg Hx   . Esophageal cancer Neg Hx   . Pancreatic cancer Neg Hx     Social History   Tobacco Use  . Smoking status: Former Smoker    Packs/day: 0.25    Years: 35.00    Pack years: 8.75    Types: Cigarettes    Quit date: 11/29/1993    Years since quitting: 27.2  . Smokeless tobacco: Never Used  Vaping Use  . Vaping Use: Never used  Substance Use Topics  . Alcohol use: Yes    Alcohol/week: 1.0 standard drink    Types: 1 Cans of beer per week  . Drug use: No    ROS: As per history of present illness, otherwise negative  BP 130/88   Pulse (!) 102   Ht 5' 5.25" (1.657 m)   Wt 246 lb 3.2 oz (111.7 kg)   SpO2 99%   BMI 40.66 kg/m  Gen: awake, alert, NAD HEENT: anicteric CV: RRR, no mrg Pulm: CTA b/l Abd: soft, obese, NT/ND, +BS throughout Ext: no c/c/e Neuro: nonfocal   RELEVANT LABS AND IMAGING: CBC    Component Value Date/Time   WBC 6.2 12/29/2020 0828   RBC 4.20 (L) 12/29/2020 0828   HGB 13.4 12/29/2020 0828   HGB 13.9 09/05/2020 0950   HCT 42.1  12/29/2020 0828   HCT 42.0 09/05/2020 0950   PLT 155 12/29/2020 0828   PLT 165 09/05/2020 0950   MCV 100.2 (H) 12/29/2020 0828   MCV 96 09/05/2020 0950   MCH 31.9 12/29/2020 0828   MCHC 31.8 12/29/2020 0828   RDW 13.9 12/29/2020 0828   RDW 11.6 09/05/2020 0950   LYMPHSABS 1.4 12/11/2020 0632   MONOABS 0.5 12/11/2020 0632   EOSABS 0.3 12/11/2020 0632   BASOSABS 0.0 12/11/2020  67    ASSESSMENT/PLAN: 70 year old male with a past medical history of A. fib/flutter, prior TIA, on Eliquis, CAD status post three-vessel CABG in October 2021, scoliosis and spinal stenosis/disc disease, anxiety and depression who is seen to evaluate constipation and for colorectal cancer screening.  1.  Colon cancer screening --screening colonoscopy recommended.  We discussed the risk, benefits and alternatives and he is agreeable and wishes to proceed  Will hold Eliquis 2 days prior to endoscopic procedures - will instruct when and how to resume after procedure. Benefits and risks of procedure explained including risks of bleeding, perforation, infection, missed lesions, reactions to medications and possible need for hospitalization and surgery for complications. Additional rare but real risk of stroke or other vascular clotting events off Eliquis also explained and need to seek urgent help if any signs of these problems occur. Will communicate by phone or EMR with patient's  prescribing provider to confirm that holding Eliquis is reasonable in this case.   2.  Chronic constipation --MiraLAX has been effective I recommended he use 17 g daily  3.  Symptomatic hemorrhoids --further evaluation at time of colonoscopy.  We can consider therapy including banding if appropriate after colonoscopy     Cc:Cipriano [address], [place],  [place] [address]

## 2021-03-04 NOTE — Telephone Encounter (Signed)
Anticoagulation letter has been sent electronically and by manual fax to Dr Adrian Prows, patient's PCP/prescribing MD at fax [phone]. Phone number to Dr Verita Lamb is [phone]. We will await Dr Suezanne Cheshire response.

## 2021-03-04 NOTE — Patient Instructions (Addendum)
You have been scheduled for a colonoscopy. Please follow written instructions given to you at your visit today.  Please pick up your prep supplies at the pharmacy within the next 1-3 days. If you use inhalers (even only as needed), please bring them with you on the day of your procedure.  Continue Miralax daily.  If you are age 69 or older, your body mass index should be between 23-30. Your Body mass index is 40.66 kg/m. If this is out of the aforementioned range listed, please consider follow up with your Primary Care Provider.  Due to recent changes in healthcare laws, you may see the results of your imaging and laboratory studies on MyChart before your provider has had a chance to review them.  We understand that in some cases there may be results that are confusing or concerning to you. Not all laboratory results come back in the same time frame and the provider may be waiting for multiple results in order to interpret others.  Please give [place] 48 hours in order for your provider to thoroughly review all the results before contacting the office for clarification of your results.

## 2021-03-04 NOTE — Telephone Encounter (Signed)
   Jeremy Reese 20-Jul-1951 [id number]  Dear Dr Ola Spurr:  We have scheduled the above named patient for a(n) colonoscopy procedure. Our records show that (s)he is on anticoagulation therapy.  Please advise as to whether the patient may come off their therapy of Eliquis 2 days prior to their procedure which is scheduled for 05/29/21.  Please fax response to [phone].  Sincerely,    [facility]

## 2021-03-10 NOTE — Telephone Encounter (Signed)
Dr. Blane Ohara office called to advise they redirected the clearance request to Dr. Rockey Situ who is the patient cardiologist. No other information was provided.

## 2021-03-10 NOTE — Telephone Encounter (Signed)
Request for surgical clearance:     Endoscopy Procedure  What type of surgery is being performed?     colonoscopy  When is this surgery scheduled?     05/29/21  What type of clearance is required ?   Pharmacy  Are there any medications that need to be held prior to surgery and how long? Eliquis, 2 days  Practice name and name of physician performing surgery?      [facility]  What is your office phone and fax number?      Phone- [phone]  [phone]  Anesthesia type (None, local, MAC, general) ?       MAC

## 2021-03-11 NOTE — Telephone Encounter (Addendum)
Patient with diagnosis of afib on Eliquis for anticoagulation.    Procedure: colonoscopy Date of procedure: 05/29/21  CHA2DS2-VASc Score = 5  This indicates a 7.2% annual risk of stroke. The patient's score is based upon: CHF History: Yes HTN History: No Diabetes History: No Stroke History: Yes Vascular Disease History: Yes Age Score: 1 Gender Score: 0     CrCl 62 ml/min Platelet count 184  Would recommend holding Eliquis only 1 day prior to procedure due to hx of CVA/TIA. Since MD is requesting a 2 day hold, I will send to Dr. Rockey Situ for his input.

## 2021-03-15 NOTE — Telephone Encounter (Signed)
Two day hold fine Thx TG

## 2021-03-17 NOTE — Telephone Encounter (Signed)
I have spoken to patient to advise that per Dr Rockey Situ, he may hold Eliquis 2 days prior to his upcoming colonoscopy. Patient verbalizes understanding of this information.

## 2021-05-29 ENCOUNTER — Encounter: Payer: Medicare HMO | Admitting: Internal Medicine

## 2021-06-02 DIAGNOSIS — H5213 Myopia, bilateral: Secondary | ICD-10-CM | POA: Diagnosis not present

## 2021-06-15 DIAGNOSIS — Z6841 Body Mass Index (BMI) 40.0 and over, adult: Secondary | ICD-10-CM | POA: Diagnosis not present

## 2021-06-15 DIAGNOSIS — I10 Essential (primary) hypertension: Secondary | ICD-10-CM | POA: Diagnosis not present

## 2021-06-15 DIAGNOSIS — M4800 Spinal stenosis, site unspecified: Secondary | ICD-10-CM | POA: Diagnosis not present

## 2021-06-15 DIAGNOSIS — I4891 Unspecified atrial fibrillation: Secondary | ICD-10-CM | POA: Diagnosis not present

## 2021-06-15 DIAGNOSIS — E785 Hyperlipidemia, unspecified: Secondary | ICD-10-CM | POA: Diagnosis not present

## 2021-06-15 DIAGNOSIS — F32A Depression, unspecified: Secondary | ICD-10-CM | POA: Diagnosis not present

## 2021-06-15 DIAGNOSIS — Z0000 Encounter for general adult medical examination without abnormal findings: Secondary | ICD-10-CM | POA: Diagnosis not present

## 2021-06-15 DIAGNOSIS — I201 Angina pectoris with documented spasm: Secondary | ICD-10-CM | POA: Diagnosis not present

## 2021-06-18 ENCOUNTER — Telehealth: Payer: Self-pay | Admitting: Internal Medicine

## 2021-06-18 NOTE — Telephone Encounter (Signed)
Inbound call from patient requesting a call please.  Has questions about prep medication.

## 2021-06-18 NOTE — Telephone Encounter (Signed)
Patient called concerning Sutab and its precautionary statement that if patient has heart disease, they should inform their physician. Patient states that her had CABG 08/2020, has atrial fibrillation and was recently found to have bilateral complete occluded carotid arteries but is told he is not a surgical candidate.  I explained that typically, these type medications have precautionary warnings due to the fact that they can cause electrolyte imbalance from producing so many bowel movements, but as long as he is getting ample fluid in him throughout the prep time, things should be okay. However, due to his concern regarding colonoscopy preps and his current cardiac status, I advised I would make Dr Hilarie Fredrickson aware.  Of note: patient was given Eliquis clearance from Dr Rockey Situ on 03/15/21.

## 2021-06-21 NOTE — Telephone Encounter (Signed)
Ok to proceed with colonoscopy, other preps would be expected to have similar risks Risks/benefits have been explained to patient at time of his office visit

## 2021-06-23 DIAGNOSIS — M5442 Lumbago with sciatica, left side: Secondary | ICD-10-CM | POA: Diagnosis not present

## 2021-06-23 DIAGNOSIS — M5441 Lumbago with sciatica, right side: Secondary | ICD-10-CM | POA: Diagnosis not present

## 2021-06-23 DIAGNOSIS — R200 Anesthesia of skin: Secondary | ICD-10-CM | POA: Diagnosis not present

## 2021-06-23 DIAGNOSIS — G8929 Other chronic pain: Secondary | ICD-10-CM | POA: Diagnosis not present

## 2021-06-24 ENCOUNTER — Other Ambulatory Visit: Payer: Self-pay | Admitting: Physical Medicine & Rehabilitation

## 2021-06-24 DIAGNOSIS — G8929 Other chronic pain: Secondary | ICD-10-CM

## 2021-06-24 DIAGNOSIS — M545 Low back pain, unspecified: Secondary | ICD-10-CM

## 2021-06-26 ENCOUNTER — Encounter: Payer: Medicare HMO | Admitting: Internal Medicine

## 2021-07-16 ENCOUNTER — Ambulatory Visit
Admission: RE | Admit: 2021-07-16 | Discharge: 2021-07-16 | Disposition: A | Discharge status: Home / Self Care | Payer: Medicare HMO | Source: Ambulatory Visit | Attending: Physical Medicine & Rehabilitation | Admitting: Physical Medicine & Rehabilitation

## 2021-07-16 DIAGNOSIS — M48061 Spinal stenosis, lumbar region without neurogenic claudication: Secondary | ICD-10-CM | POA: Diagnosis not present

## 2021-07-16 DIAGNOSIS — G8929 Other chronic pain: Secondary | ICD-10-CM

## 2021-07-16 DIAGNOSIS — M545 Low back pain, unspecified: Secondary | ICD-10-CM

## 2021-08-04 NOTE — Addendum Note (Signed)
Encounter addended by: Marion Downer on: 08/04/2021 2:15 PM  Actions taken: Imaging Exam ended

## 2021-08-05 DIAGNOSIS — M5442 Lumbago with sciatica, left side: Secondary | ICD-10-CM | POA: Diagnosis not present

## 2021-08-05 DIAGNOSIS — G8929 Other chronic pain: Secondary | ICD-10-CM | POA: Diagnosis not present

## 2021-08-05 DIAGNOSIS — M5441 Lumbago with sciatica, right side: Secondary | ICD-10-CM | POA: Diagnosis not present

## 2021-08-05 DIAGNOSIS — M48062 Spinal stenosis, lumbar region with neurogenic claudication: Secondary | ICD-10-CM | POA: Diagnosis not present

## 2021-08-14 DIAGNOSIS — M48062 Spinal stenosis, lumbar region with neurogenic claudication: Secondary | ICD-10-CM | POA: Diagnosis not present

## 2021-09-08 DIAGNOSIS — M47816 Spondylosis without myelopathy or radiculopathy, lumbar region: Secondary | ICD-10-CM | POA: Diagnosis not present

## 2021-09-08 DIAGNOSIS — M5441 Lumbago with sciatica, right side: Secondary | ICD-10-CM | POA: Diagnosis not present

## 2021-09-08 DIAGNOSIS — M5442 Lumbago with sciatica, left side: Secondary | ICD-10-CM | POA: Diagnosis not present

## 2021-09-08 DIAGNOSIS — M48062 Spinal stenosis, lumbar region with neurogenic claudication: Secondary | ICD-10-CM | POA: Diagnosis not present

## 2021-09-08 DIAGNOSIS — G8929 Other chronic pain: Secondary | ICD-10-CM | POA: Diagnosis not present

## 2021-09-10 DIAGNOSIS — Z125 Encounter for screening for malignant neoplasm of prostate: Secondary | ICD-10-CM | POA: Diagnosis not present

## 2021-09-10 DIAGNOSIS — E782 Mixed hyperlipidemia: Secondary | ICD-10-CM | POA: Diagnosis not present

## 2021-09-10 DIAGNOSIS — I10 Essential (primary) hypertension: Secondary | ICD-10-CM | POA: Diagnosis not present

## 2021-09-10 DIAGNOSIS — F32A Depression, unspecified: Secondary | ICD-10-CM | POA: Diagnosis not present

## 2021-09-10 DIAGNOSIS — I480 Paroxysmal atrial fibrillation: Secondary | ICD-10-CM | POA: Diagnosis not present

## 2021-09-14 ENCOUNTER — Telehealth: Payer: Self-pay | Admitting: Internal Medicine

## 2021-09-14 NOTE — Telephone Encounter (Signed)
Patient called wanting to know whether or not he should discontinue his Eloquis for procedure scheduled this Friday.  Please call and advise.  Thank you.

## 2021-09-14 NOTE — Telephone Encounter (Signed)
Patient was originally cleared in April 2022 to hold Eliquis for procedure. He cancelled his July procedure and rescheduled for 09/18/21. Would you all please provide [place] with an updated clearance? Thanks!    Request for surgical clearance:     Endoscopy Procedure  What type of surgery is being performed?     colonoscopy  When is this surgery scheduled?     09/18/21  What type of clearance is required ?   Pharmacy  Are there any medications that need to be held prior to surgery and how long? Eliquis, 2 days  Practice name and name of physician performing surgery?      [facility]  What is your office phone and fax number?      Phone- [phone]  [phone]  Anesthesia type (None, local, MAC, general) ?       MAC

## 2021-09-14 NOTE — Telephone Encounter (Signed)
Patient with diagnosis of atrial fibrillation on Eliquis for anticoagulation.    Procedure: colonoscopy Date of procedure:09/18/21   CHA2DS2-VASc Score = 6   This indicates a 9.7% annual risk of stroke. The patient's score is based upon: CHF History: 1 HTN History: 1 Diabetes History: 0 Stroke History: 2 Vascular Disease History: 1 Age Score: 1 Gender Score: 0    CrCl 62 (with adjusted body weight) Platelet count 203  Per office protocol, patient can hold Eliquis for 2 days prior to procedure.   Patient will not need bridging with Lovenox (enoxaparin) around procedure.  (Note: this clearance was originally done in April 2022, but procedure moved.  At that time Dr. Rockey Situ cleared patient to hold x 2 days.  Since no significant changes since then, will go with 2 day hold)

## 2021-09-14 NOTE — Telephone Encounter (Signed)
Clinical pharmacist to review Eliquis 

## 2021-09-15 NOTE — Telephone Encounter (Signed)
Patient advised that he has been re-cleared by cardiology to hold Eliquis 2 days prior to his upcoming procedure. Patient verbalizes understanding.

## 2021-09-16 DIAGNOSIS — N3944 Nocturnal enuresis: Secondary | ICD-10-CM | POA: Diagnosis not present

## 2021-09-16 DIAGNOSIS — I10 Essential (primary) hypertension: Secondary | ICD-10-CM | POA: Diagnosis not present

## 2021-09-16 DIAGNOSIS — Z6841 Body Mass Index (BMI) 40.0 and over, adult: Secondary | ICD-10-CM | POA: Diagnosis not present

## 2021-09-16 DIAGNOSIS — F32A Depression, unspecified: Secondary | ICD-10-CM | POA: Diagnosis not present

## 2021-09-16 DIAGNOSIS — E785 Hyperlipidemia, unspecified: Secondary | ICD-10-CM | POA: Diagnosis not present

## 2021-09-16 DIAGNOSIS — I4891 Unspecified atrial fibrillation: Secondary | ICD-10-CM | POA: Diagnosis not present

## 2021-09-16 DIAGNOSIS — R202 Paresthesia of skin: Secondary | ICD-10-CM | POA: Diagnosis not present

## 2021-09-16 DIAGNOSIS — D649 Anemia, unspecified: Secondary | ICD-10-CM | POA: Diagnosis not present

## 2021-09-18 ENCOUNTER — Encounter: Payer: Medicare HMO | Admitting: Internal Medicine

## 2021-09-18 ENCOUNTER — Telehealth: Payer: Self-pay | Admitting: Internal Medicine

## 2021-09-18 NOTE — Telephone Encounter (Signed)
Patient called and is cancelling procedure for this afternoon.  After reading the instructions and the warnings, it "scared" him because he falls in all the categories.  He will reschedule after he does further research.

## 2021-10-02 DIAGNOSIS — M47816 Spondylosis without myelopathy or radiculopathy, lumbar region: Secondary | ICD-10-CM | POA: Diagnosis not present

## 2021-10-13 DIAGNOSIS — M5442 Lumbago with sciatica, left side: Secondary | ICD-10-CM | POA: Diagnosis not present

## 2021-10-13 DIAGNOSIS — G8929 Other chronic pain: Secondary | ICD-10-CM | POA: Diagnosis not present

## 2021-10-13 DIAGNOSIS — M5441 Lumbago with sciatica, right side: Secondary | ICD-10-CM | POA: Diagnosis not present

## 2021-10-13 DIAGNOSIS — M48062 Spinal stenosis, lumbar region with neurogenic claudication: Secondary | ICD-10-CM | POA: Diagnosis not present

## 2022-02-03 IMAGING — CT CT ANGIO CHEST
2 of 7 series · 18 of 46 positions shown · IV contrast (omnipaque)
Comparison: Chest x-ray 08/10/2010

CLINICAL DATA: Coronary artery disease. Preoperative planning prior
to CABG

EXAM:
CT ANGIOGRAPHY CHEST WITH CONTRAST
TECHNIQUE: Multidetector CT imaging of the chest was performed using the
standard protocol during bolus administration of intravenous
contrast. Multiplanar CT image reconstructions and MIPs were
obtained to evaluate the vascular anatomy.
CONTRAST:  75mL OMNIPAQUE IOHEXOL 350 MG/ML SOLN

[Series 7: thins · axial · 0.93mm/px · z∈[-275,+24]mm · 15 of 482 slices shown]
[im 27/482  lung]
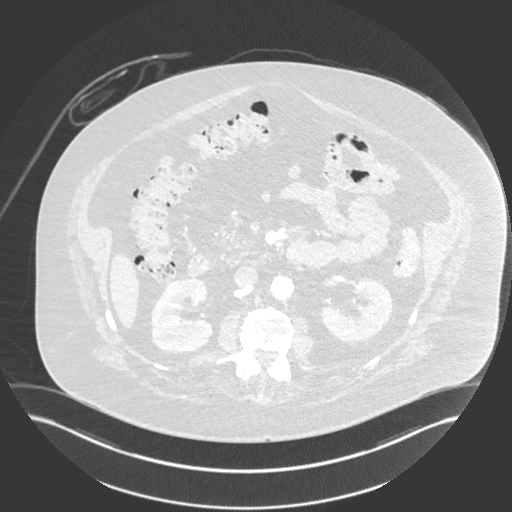
[im 54/482  soft-tissue]
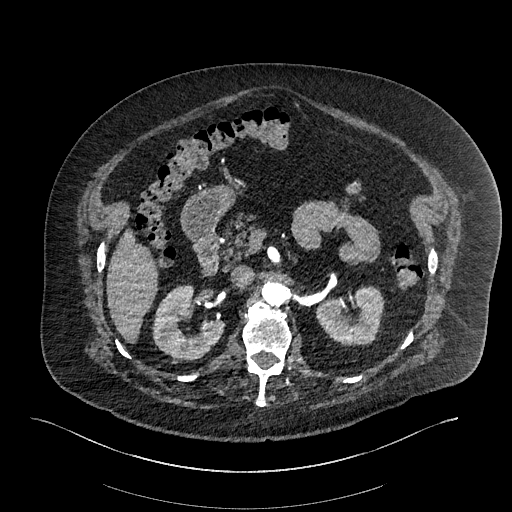
[im 81/482  lung]
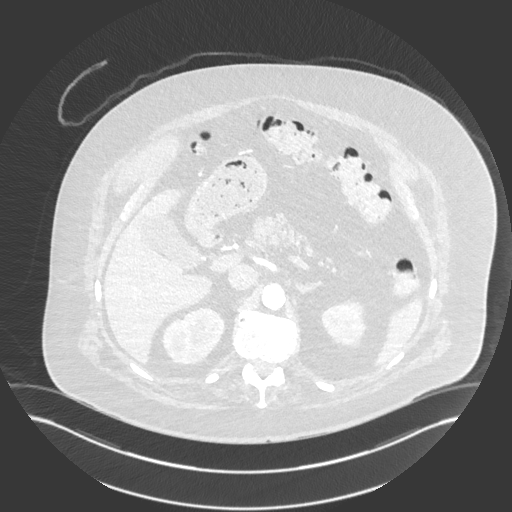
[im 107/482  soft-tissue]
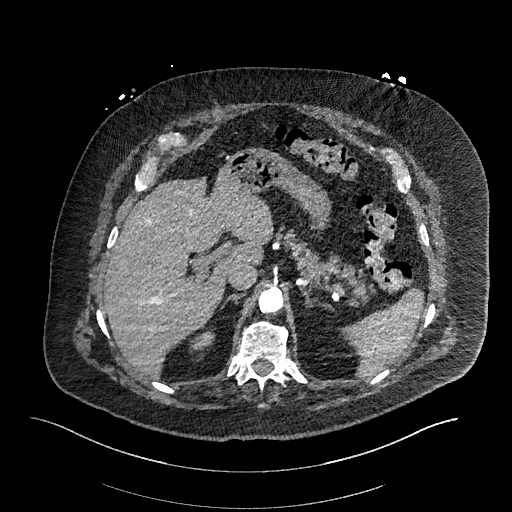
[im 161/482  lung]
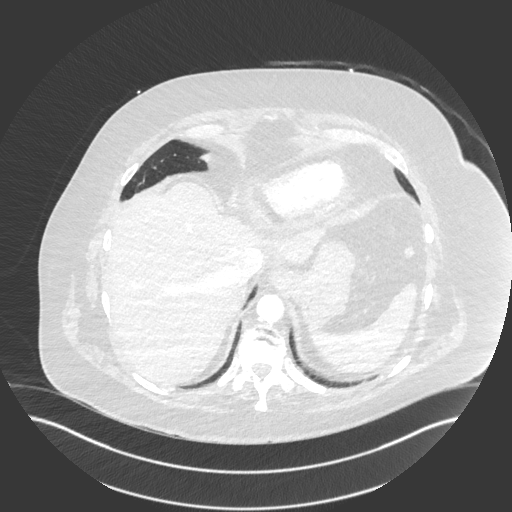
[im 188/482  soft-tissue]
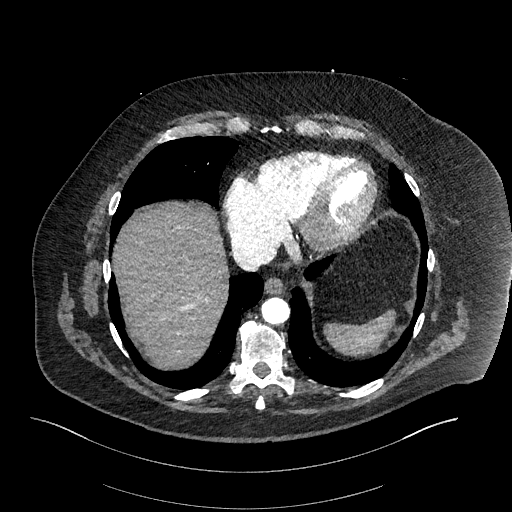
[im 214/482  lung]
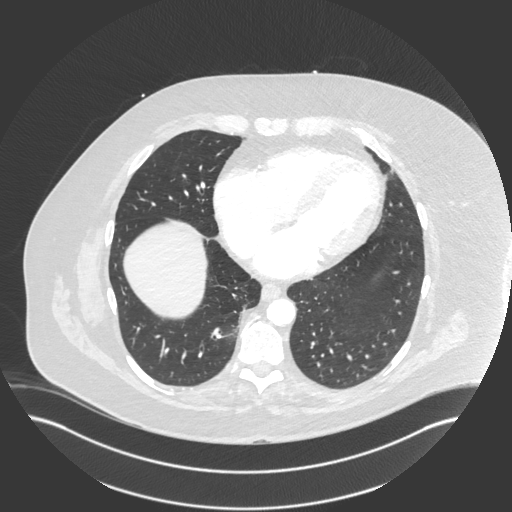
[im 241/482  soft-tissue]
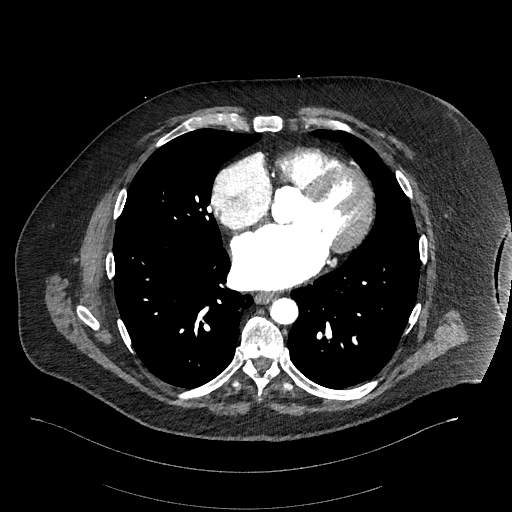
[im 268/482  lung]
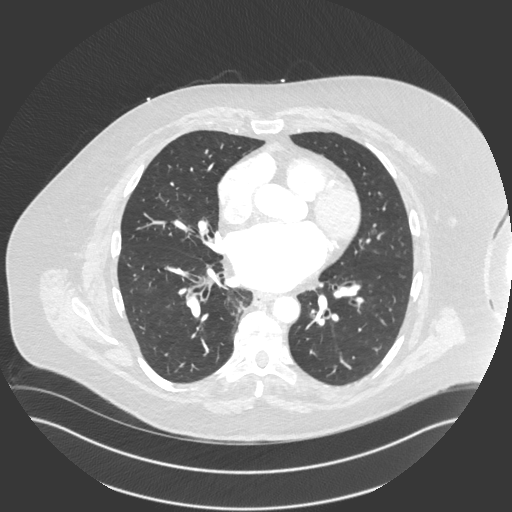
[im 294/482  soft-tissue]
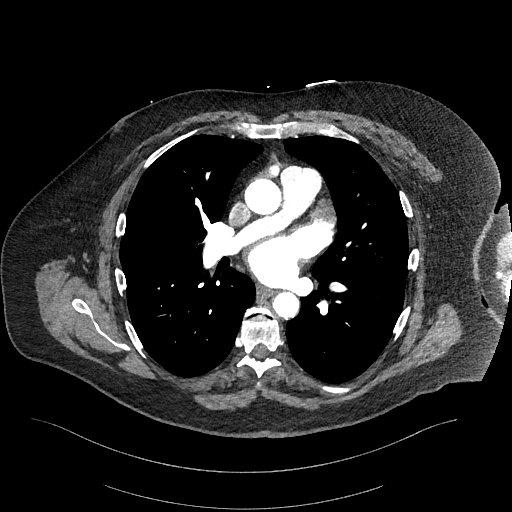
[im 321/482  lung]
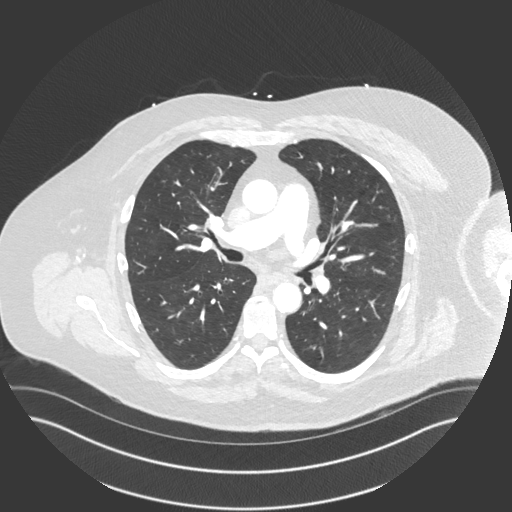
[im 375/482  soft-tissue]
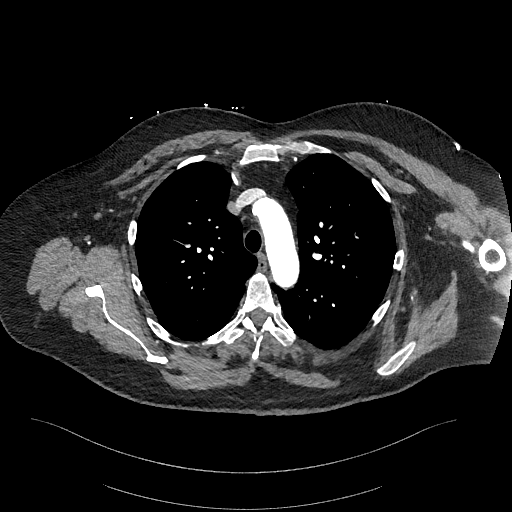
[im 401/482  lung]
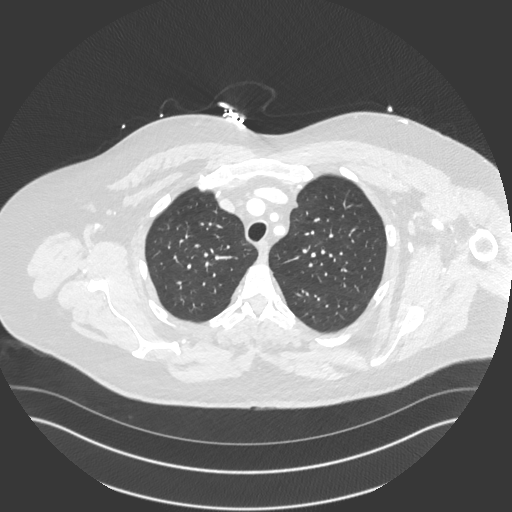
[im 428/482  soft-tissue]
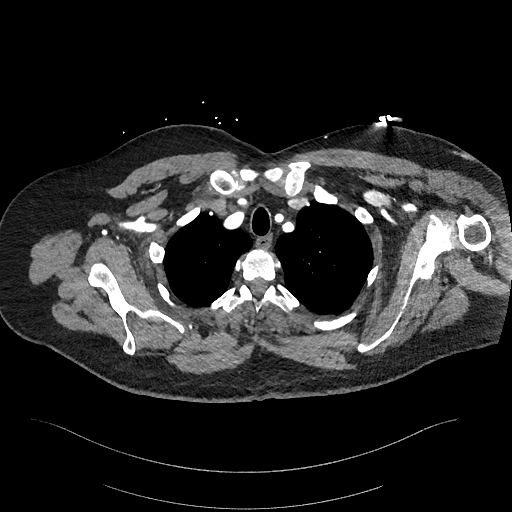
[im 455/482  lung]
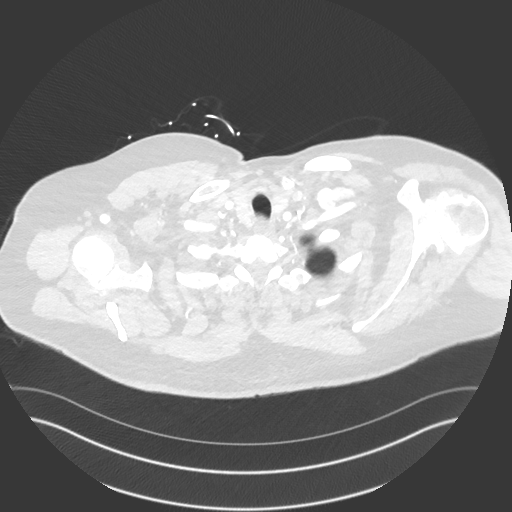

[Series 8: cor · coronal · 0.65mm/px · 3 of 170 slices shown]
[im 43/170  soft-tissue]
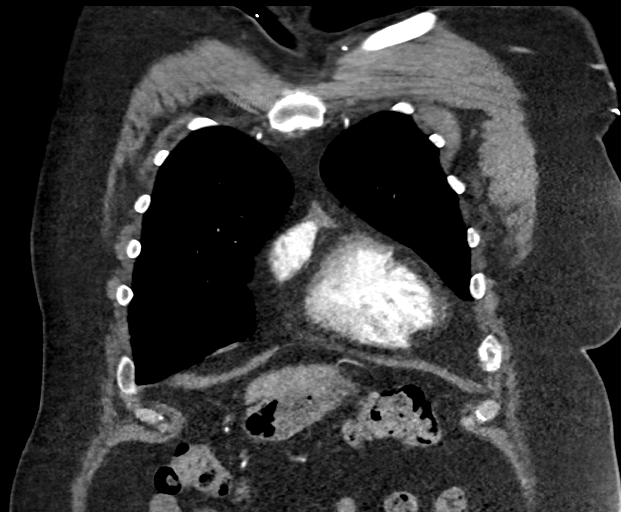
[im 85/170  soft-tissue]
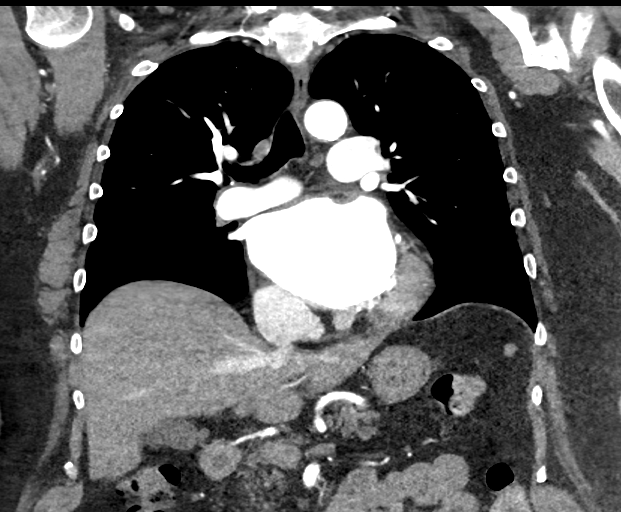
[im 127/170  soft-tissue]
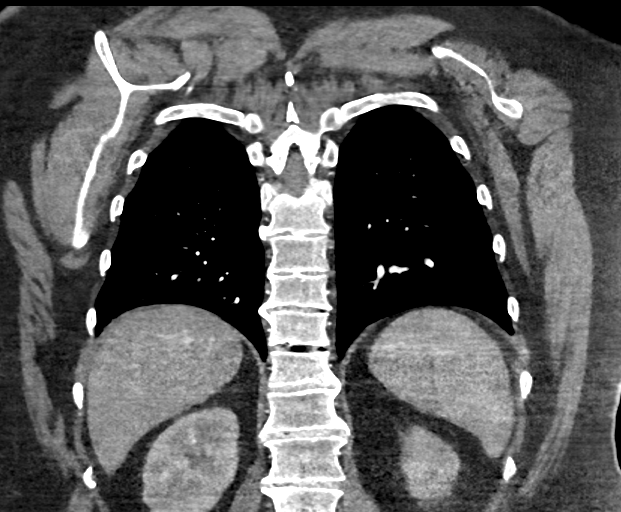

[18 of 46 positions shown; findings below may reference images not displayed]

FINDINGS: Cardiovascular: There is moderate dilation of left atrium and mild
dilation of the right atrium. Overall heart size is upper limits of
normal. No pericardial effusion. Thoracic aorta is normal in course
and caliber without aneurysm or dissection. Three vessel arch
without evidence of stenosis. There are scattered atherosclerotic
calcifications involving the aorta, aortic valve, branch vessels,
and coronary arteries. Central pulmonary vasculature is well
opacified without filling defect to suggest pulmonary embolism.

Mediastinum/Nodes: No enlarged mediastinal, hilar, or axillary lymph
nodes. Thyroid gland, trachea, and esophagus demonstrate no
significant findings.

Lungs/Pleura: Lungs are clear. No pleural effusion or pneumothorax.

Upper Abdomen: Reflux of contrast into the IVC and hepatic veins. No
acute findings within the visualized upper abdomen.

Musculoskeletal: No chest wall abnormality. No acute or significant
osseous findings.

Review of the MIP images confirms the above findings.
IMPRESSION: 1. Negative for thoracic aortic aneurysm or dissection.
2. Findings suggestive of right heart dysfunction.
3. Aortic and coronary artery atherosclerosis. ([id number]).
4. Lungs are clear.

## 2022-02-04 IMAGING — CR DG CHEST 2V
2 series · 2 of 2 positions shown · non-contrast
Comparison: Chest CT 09/15/2020.  Chest x-ray 08/10/2010.

CLINICAL DATA: Chest pain.

EXAM:
CHEST - 2 VIEW

[chest pa]
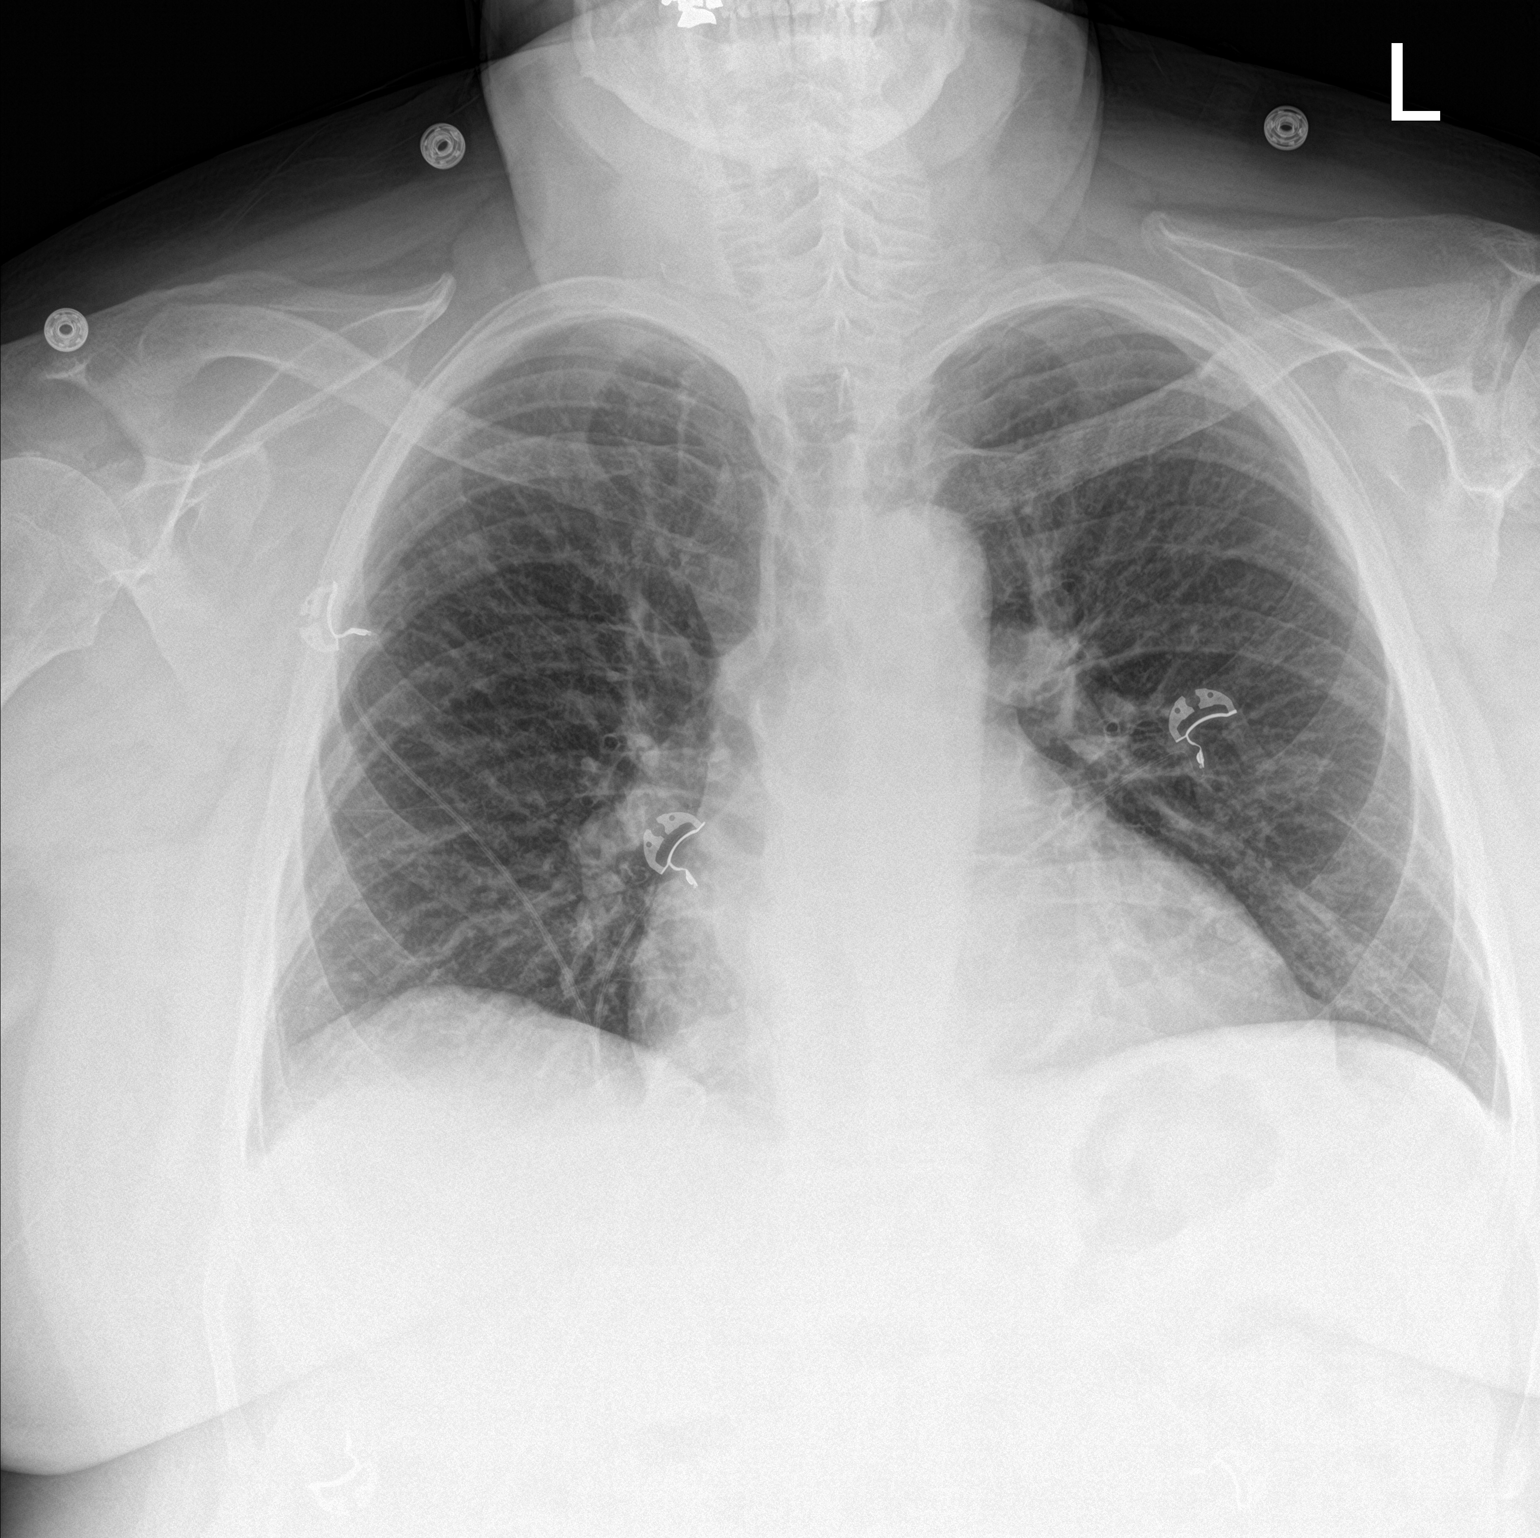

[chest lat]
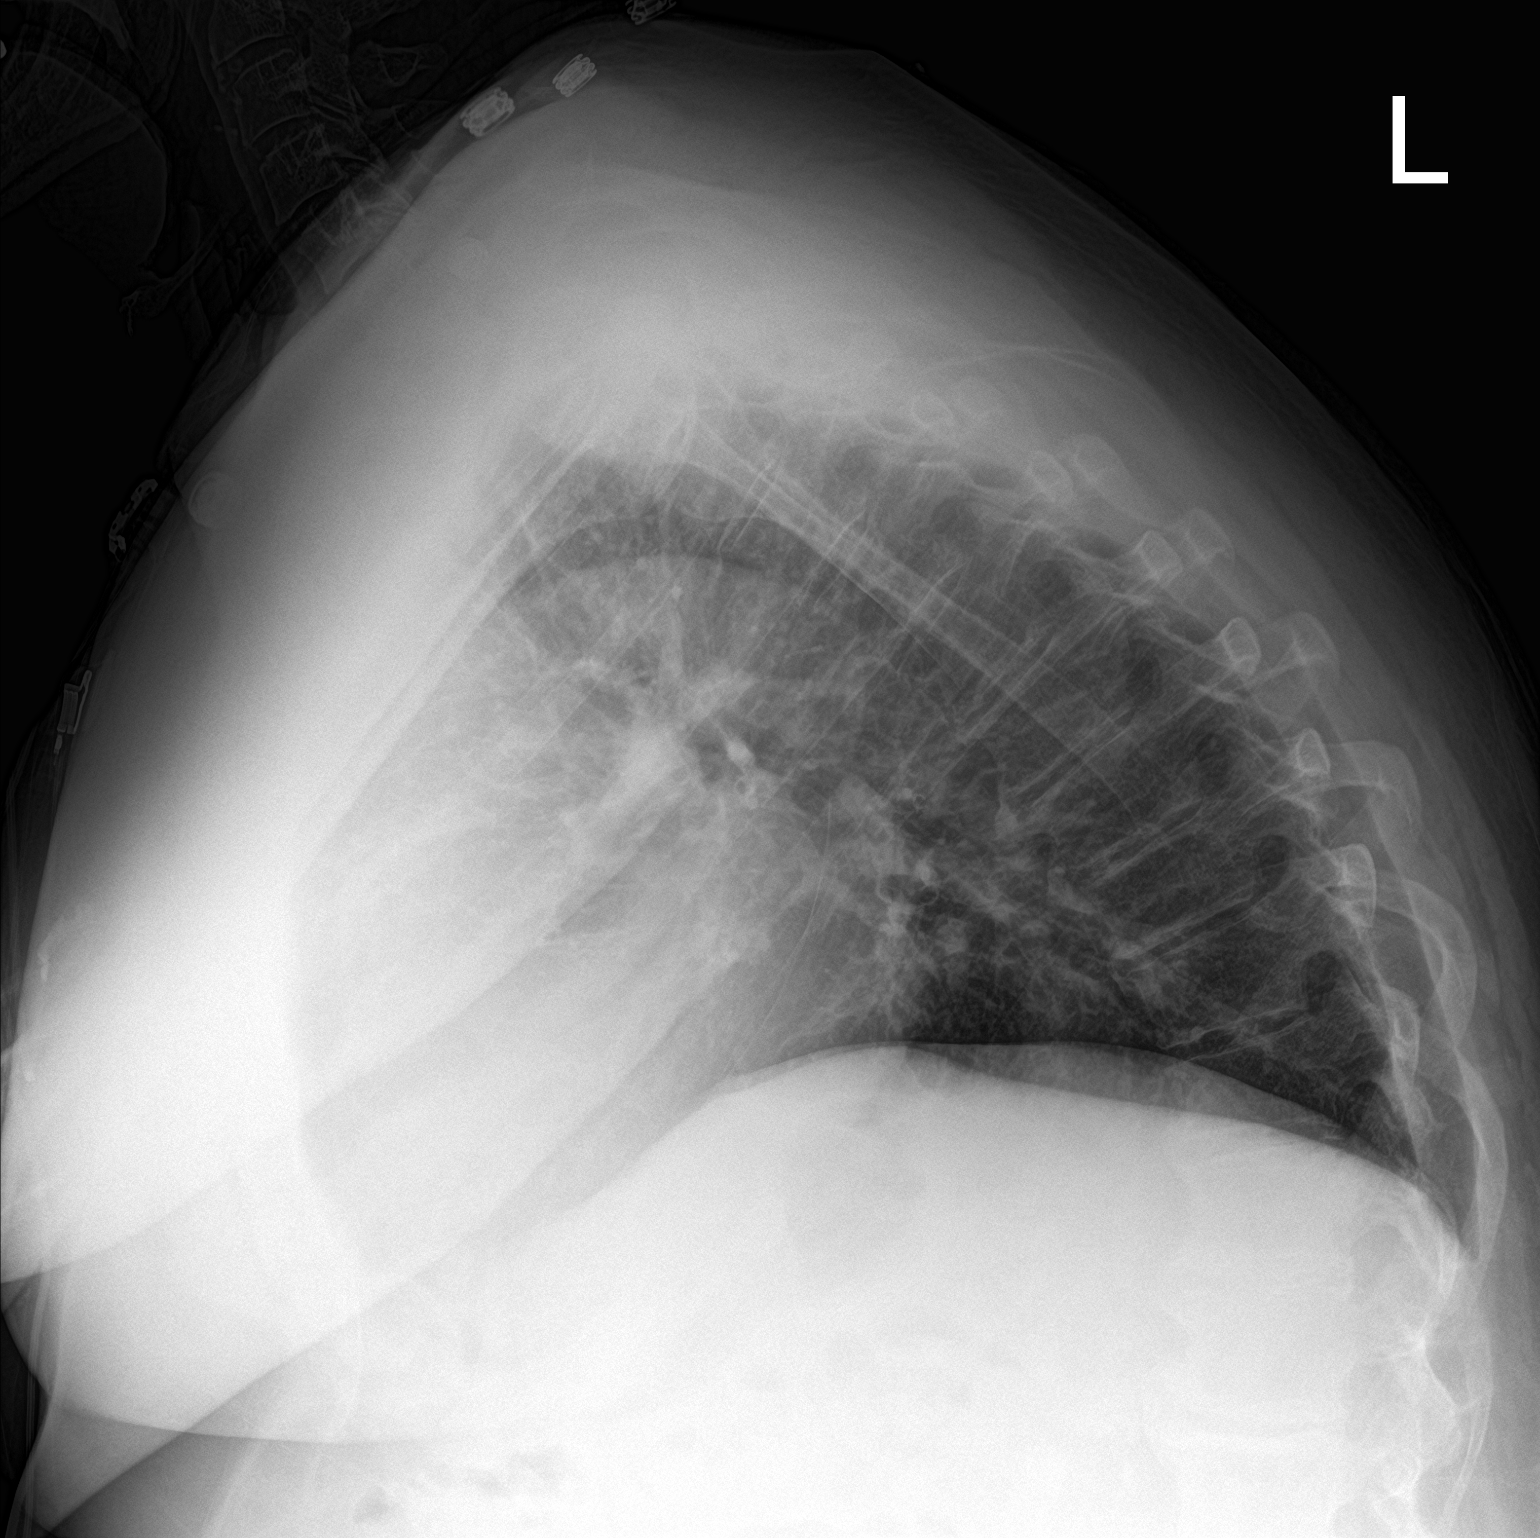

[2 of 2 positions shown; findings below may reference images not displayed]

FINDINGS: Mediastinum and hilar structures normal. Heart size stable. No
pulmonary venous congestion. Low lung volumes. Very mild bilateral
interstitial prominence cannot be excluded. Mild
pneumonitis/interstitial edema cannot be excluded. No pleural
effusion or pneumothorax. Diffuse thoracic spine osteopenia
degenerative change. Kyphosis of the thoracic spine noted.
IMPRESSION: Very mild bilateral interstitial prominence cannot be excluded. Mild
pneumonitis/interstitial edema cannot be excluded.

## 2022-02-24 ENCOUNTER — Encounter: Payer: Self-pay | Admitting: Urology

## 2022-02-24 ENCOUNTER — Ambulatory Visit: Payer: Medicare HMO | Admitting: Urology

## 2022-02-24 VITALS — BP 170/90 | HR 62 | Ht 65.25 in | Wt 240.0 lb

## 2022-02-24 DIAGNOSIS — N5201 Erectile dysfunction due to arterial insufficiency: Secondary | ICD-10-CM

## 2022-02-24 DIAGNOSIS — R351 Nocturia: Secondary | ICD-10-CM

## 2022-02-24 LAB — BLADDER SCAN AMB NON-IMAGING: Scan Result: 0

## 2022-02-24 MED ORDER — SILDENAFIL CITRATE 20 MG PO TABS: ORAL_TABLET | ORAL | 0 refills | Status: AC

## 2022-02-24 NOTE — Progress Notes (Signed)
? ?02/24/2022 ?2:38 PM  ? ?Jeremy Reese ?Feb 15, 1951 ?[id number] ? ?Referring provider: Leonel Ramsay, MD ?[address] ?[place],  [place] [address] ? ?Chief Complaint  ?Patient presents with  ? Nocturia  ? ? ?HPI: ?Jeremy Reese is a 71 y.o. male referred for evaluation of nocturnal enuresis. ? ?States 2 months ago he had 3 episodes of nocturnal enuresis which occurred over 1 month ?States he is a heavy sleeper and was actually dreaming that he was urinating when the episodes occurred ?Has nocturia x3-4 which is not bothersome and he goes back to sleep ?He has had no recurrent episodes and the last 2 months ?No dysuria or gross hematuria ?No bothersome daytime voiding symptoms ?Also complains of difficulty achieving an erection and was inquiring if he would be a candidate for PDE 5 inhibitor therapy ?Organic risk factors include coronary artery disease, carotid artery disease, hyperlipidemia ?Denies chronic nitrate therapy.  He has nitroglycerin but does not have to take ? ? ?PMH: ?Past Medical History:  ?Diagnosis Date  ? Atrial flutter, paroxysmal ([place])   ? Carotid artery disease without cerebral infarction [facility])   ? Coronary artery disease 2011  ? Flatback syndrome of thoracolumbar region   ? H/O calcium pyrophosphate deposition disease (CPPD)   ? History of TIA (transient ischemic attack)   ? Hyperlipidemia   ? Paroxysmal A-fib ([place])   ? Spinal stenosis of lumbar region without neurogenic claudication   ? ? ?Surgical History: ?Past Surgical History:  ?Procedure Laterality Date  ? BILATERAL CARPAL TUNNEL RELEASE    ? CARDIAC CATHETERIZATION    ? CIRCUMCISION, NON-NEWBORN    ? CLIPPING OF ATRIAL APPENDAGE N/A 09/18/2020  ? Procedure: CLIPPING OF ATRIAL APPENDAGE USING 45 ATRICURE LAA  EXCLUSION SYSTEM;  Surgeon: Ivin Poot, MD;  Location: [place];  Service: Open Heart Surgery;  Laterality: N/A;  ? CORONARY ANGIOPLASTY WITH STENT PLACEMENT  2011  ? CORONARY ARTERY BYPASS GRAFT N/A 09/18/2020   ? Procedure: CORONARY ARTERY BYPASS GRAFTING (CABG)X 3, ON PUMP, USING LEFT INTERAL MAMMARY ARTERY AND ENDOSCOPICALLY HARVESTED RIGHT GREATER SAPHENOUS VEIN. LIMA TO LAD, SVG TO OM, SVG TO PD;  Surgeon: Ivin Poot, MD;  Location: [place];  Service: Open Heart Surgery;  Laterality: N/A;  ? ENDOVEIN HARVEST OF GREATER SAPHENOUS VEIN Right 09/18/2020  ? Procedure: ENDOVEIN HARVEST OF GREATER SAPHENOUS VEIN;  Surgeon: Ivin Poot, MD;  Location: [place];  Service: Open Heart Surgery;  Laterality: Right;  ? LEFT HEART CATH AND CORONARY ANGIOGRAPHY Left 09/12/2020  ? Procedure: LEFT HEART CATH AND CORONARY ANGIOGRAPHY;  Surgeon: Minna Merritts, MD;  Location: [place] CV LAB;  Service: Cardiovascular;  Laterality: Left;  ? TEE WITHOUT CARDIOVERSION N/A 09/18/2020  ? Procedure: TRANSESOPHAGEAL ECHOCARDIOGRAM (TEE);  Surgeon: Prescott Gum, Collier Salina, MD;  Location: [place];  Service: Open Heart Surgery;  Laterality: N/A;  ? ? ?Home Medications:  ?Allergies as of 02/24/2022   ? ?   Reactions  ? Propafenone Anxiety  ? Codeine Nausea Only  ? Penicillins Rash  ? ?  ? ?  ?Medication List  ?  ? ?  ? Accurate as of February 24, 2022  2:38 PM. If you have any questions, ask your nurse or doctor.  ?  ?  ? ?  ? ?aspirin EC 81 MG tablet ?Take 1 tablet (81 mg total) by mouth daily. ?  ?buPROPion 300 MG 24 hr tablet ?Commonly known as: WELLBUTRIN XL ?Take 1 tablet (300 mg total)  by mouth daily. ?  ?carvedilol 25 MG tablet ?Commonly known as: COREG ?Take 1 tablet (25 mg total) by mouth 2 (two) times daily with a meal. ?  ?Eliquis 5 MG Tabs tablet ?Generic drug: apixaban ?TAKE ONE TABLET TWICE DAILY ?  ?ezetimibe 10 MG tablet ?Commonly known as: ZETIA ?TAKE ONE (1) TABLET EACH DAY ?  ?gabapentin 300 MG capsule ?Commonly known as: NEURONTIN ?Take 2 capsules (600 mg total) by mouth 3 (three) times daily. ?  ?LORazepam 1 MG tablet ?Commonly known as: ATIVAN ?Take 1 tablet (1 mg total) by mouth 2 (two) times daily as needed for anxiety or  sleep. Script must last 30 days from fill date. ?  ?nitroGLYCERIN 0.4 MG SL tablet ?Commonly known as: NITROSTAT ?Place 1 tablet (0.4 mg total) under the tongue every 5 (five) minutes as needed for chest pain. ?  ?polyethylene glycol 17 g packet ?Commonly known as: MIRALAX / GLYCOLAX ?Take 17 g by mouth daily. ?  ?rosuvastatin 40 MG tablet ?Commonly known as: CRESTOR ?Take 1 tablet (40 mg total) by mouth daily. ?  ?Sutab [phone] MG Tabs ?Generic drug: Sodium Sulfate-Mag Sulfate-KCl ?Use as directed for colonoscopy. MANUFACTURER CODES!! BIN: [id number] PCN: CN GROUP: [id number] MEMBER ID: [id number];KTG AS SECONDARY INSURANCE ;NO PRIOR AUTHORIZATION ?  ? ?  ? ? ?Allergies:  ?Allergies  ?Allergen Reactions  ? Propafenone Anxiety  ? Codeine Nausea Only  ? Penicillins Rash  ? ? ?Family History: ?Family History  ?Problem Relation Age of Onset  ? Heart disease Mother 61  ?     CABG x 3   ? Hypertension Mother   ? Hypertension Father   ? Heart disease Father   ? Lung cancer Father   ? Kidney disease Other   ? Colon cancer Paternal Grandmother   ? Liver disease Neg Hx   ? Stomach cancer Neg Hx   ? Esophageal cancer Neg Hx   ? Pancreatic cancer Neg Hx   ? ? ?Social History:  reports that he quit smoking about 28 years ago. His smoking use included cigarettes. He has a 8.75 pack-year smoking history. He has never used smokeless tobacco. He reports current alcohol use of about 1.0 standard drink per week. He reports that he does not use drugs. ? ? ?Physical Exam: ?BP (!) 170/90   Pulse 62   Ht 5' 5.25" (1.657 m)   Wt 240 lb (108.9 kg)   BMI 39.63 kg/m?   ?Constitutional:  Alert and oriented, No acute distress. ?HEENT: [place] AT, moist mucus membranes.  Trachea midline, no masses. ?Cardiovascular: No clubbing, cyanosis, or edema. ?Respiratory: Normal respiratory effort, no increased work of breathing. ?GU: Prostate 40 g, smooth without nodules ?Psychiatric: Normal mood and affect. ? ?Laboratory  Data: ? ?Urinalysis ?Dipstick/microscopy negative ? ? ?Assessment & Plan:   ? ?1.  Nocturnal enuresis ?3 episodes over 1 month which have not recurred in the last 2 months ?No treatment recommended ? ?2.  Nocturia ?Symptoms are not bothersome ? ?3.  Erectile dysfunction ?No contraindications to PDE 5 inhibitor therapy as long as he does not take sublingual nitrate within 24 hours of dosing which was stressed ?Rx sildenafil 20 mg 1-5 tabs 1 hour prior to intercourse.  He requested a printed description ? ? ?Abbie Sons, MD ? ?[facility] [place] ?[address], [address] ?[place], [place] [address] ?[phone] ? ?

## 2022-02-25 ENCOUNTER — Encounter: Payer: Self-pay | Admitting: Urology

## 2022-02-25 LAB — URINALYSIS, COMPLETE
Bilirubin, UA: NEGATIVE
Glucose, UA: NEGATIVE
Ketones, UA: NEGATIVE
Leukocytes,UA: NEGATIVE
Nitrite, UA: NEGATIVE
RBC, UA: NEGATIVE
Specific Gravity, UA: >1.03 — ABNORMAL HIGH (ref 1.005–1.030)
Urobilinogen, Ur: 1 mg/dL (ref 0.2–1.0)
pH, UA: 6 (ref 5.0–7.5)

## 2022-02-25 LAB — MICROSCOPIC EXAMINATION
Bacteria, UA: NONE SEEN
Epithelial Cells (non renal): NONE SEEN /hpf (ref 0–10)

## 2022-04-20 NOTE — Progress Notes (Unsigned)
Cardiology Office Note  Date:  04/21/2022   ID:  Jeremy Reese, Jeremy Reese [birth year], MRN [id number]  PCP:  Leonel Ramsay, MD   Chief Complaint  Patient presents with   6 month follow up     Patient c/o shortness of breath with over exertion. Medications reviewed by the patient verbally.     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 Lucky Cowboy,  atrial fibrillation dating back to 2011  severe back pain not a surgical candidate per the surgeons given his severe bilateral carotid disease Old CVA on CT scan head in 2015 09/18/2020.  He underwent CABG x 3  who presents for follow-up of his chronic atrial fibrillation, PAD, CAD with recent CABG for left main disease  Last seen in clinic 1/22 Reports doing relatively well in general Uses a walker to get around.  No falls Has a new [place] Shepherd puppy, spends time cleaning up after him, indoor dog  Has appreciated ABD bloated, 3 months, about the same, not getting worse Used to be on Lasix after surgery, this was discontinued by CT surgery he feels  Eliquis price elevated, in donut hole  Lab work reviewed Glucose 110 Total chol 113, LDL 50 Tolerating Zetia and Crestor  EKG personally reviewed by myself on todays visit  shows atrial fibrillation with ventricular rate 70 bpm, no significant ST-T wave changes  Other past medical history reviewed Admitted to the hospital with suicidal ideation November 10, 2020 Inpatient stay in behavioral health Worsening depression  Past cardiac history reviewed cardiac catheterization October 2021 Images very concerning for critical ostial/proximal left circumflex disease estimated 95% or greater LAD with moderate to severe ostial/proximal disease though difficult to visualize, and 60% mid disease  Cath 09/12/2020 Ost Cx to Prox Cx lesion is 95%  stenosed. Ost LAD to Prox LAD lesion is 70% stenosed. Mid LAD lesion is 60% stenosed.  worsening anginal symptoms over the past several weeks with clear escalation of his isosorbide to relieve his pressure Critical ostial left circumflex disease of a large vessel (stent stenosis, heavily calcified), nondominant right, also with disease of the LAD Evaluated for CABG.   Echo: EF 60%, calcified aortic valve  operating room details 09/18/2020.  He underwent CABG x 3 utilizing LIMA to LAD, SVG to OM1, and SVG to PDA.  He had clipping of his LA appendage with a 45 mm Atricure Clip.  In the past he has tried higher dose pravastatin 80 mg and had side effects including fatigue, weakness, muscle ache. He is able to tolerate 40 mg daily.  Carotid ultrasound reviewed with him from 01/14/2015 showing bilateral internal carotid occlusion, no significant change from ultrasound dated June 2015   Prior studies show cardioversion 02/25/2015 Transesophageal echo 08/11/2010 with PFO noted, ejection fraction 50%, left atrium moderately dilated, moderate MR, mild to moderate TR    PMH:   has a past medical history of Atrial flutter, paroxysmal ([place]), Carotid artery disease without cerebral infarction [facility]), Coronary artery disease (2011), Flatback syndrome of thoracolumbar region, H/O calcium pyrophosphate deposition disease (CPPD), History of TIA (transient ischemic attack), Hyperlipidemia, Paroxysmal A-fib ([place]), and Spinal stenosis of lumbar region without neurogenic claudication.  PSH:    Past Surgical History:  Procedure Laterality Date   BILATERAL CARPAL TUNNEL RELEASE     CARDIAC CATHETERIZATION     CIRCUMCISION, NON-NEWBORN  CLIPPING OF ATRIAL APPENDAGE N/A 09/18/2020   Procedure: CLIPPING OF ATRIAL APPENDAGE USING 11 ATRICURE LAA  EXCLUSION SYSTEM;  Surgeon: Ivin Poot, MD;  Location: [place];  Service: Open Heart Surgery;  Laterality: N/A;   CORONARY ANGIOPLASTY WITH STENT PLACEMENT  2011    CORONARY ARTERY BYPASS GRAFT N/A 09/18/2020   Procedure: CORONARY ARTERY BYPASS GRAFTING (CABG)X 3, ON PUMP, USING LEFT INTERAL MAMMARY ARTERY AND ENDOSCOPICALLY HARVESTED RIGHT GREATER SAPHENOUS VEIN. LIMA TO LAD, SVG TO OM, SVG TO PD;  Surgeon: Ivin Poot, MD;  Location: [place];  Service: Open Heart Surgery;  Laterality: N/A;   ENDOVEIN HARVEST OF GREATER SAPHENOUS VEIN Right 09/18/2020   Procedure: ENDOVEIN HARVEST OF GREATER SAPHENOUS VEIN;  Surgeon: Ivin Poot, MD;  Location: [place];  Service: Open Heart Surgery;  Laterality: Right;   LEFT HEART CATH AND CORONARY ANGIOGRAPHY Left 09/12/2020   Procedure: LEFT HEART CATH AND CORONARY ANGIOGRAPHY;  Surgeon: Minna Merritts, MD;  Location: [place] CV LAB;  Service: Cardiovascular;  Laterality: Left;   TEE WITHOUT CARDIOVERSION N/A 09/18/2020   Procedure: TRANSESOPHAGEAL ECHOCARDIOGRAM (TEE);  Surgeon: Prescott Gum, Collier Salina, MD;  Location: [place];  Service: Open Heart Surgery;  Laterality: N/A;    Current Outpatient Medications  Medication Sig Dispense Refill   aspirin EC 81 MG tablet Take 1 tablet (81 mg total) by mouth daily. 90 tablet 3   buPROPion (WELLBUTRIN XL) 300 MG 24 hr tablet Take 1 tablet (300 mg total) by mouth daily. 30 tablet 1   carvedilol (COREG) 25 MG tablet Take 1 tablet (25 mg total) by mouth 2 (two) times daily with a meal. 60 tablet 3   ELIQUIS 5 MG TABS tablet TAKE ONE TABLET TWICE DAILY 60 tablet 1   ezetimibe (ZETIA) 10 MG tablet TAKE ONE (1) TABLET EACH DAY 30 tablet 3   furosemide (LASIX) 20 MG tablet Take 1 tablet (20 mg total) by mouth daily as needed (abdominal swelling). 90 tablet 3   gabapentin (NEURONTIN) 300 MG capsule Take 2 capsules (600 mg total) by mouth 3 (three) times daily. (Patient taking differently: Take 300 mg by mouth 3 (three) times daily.) 180 capsule 1   LORazepam (ATIVAN) 1 MG tablet Take 1 tablet (1 mg total) by mouth 2 (two) times daily as needed for anxiety or sleep. Script must last  30 days from fill date. 60 tablet 1   nitroGLYCERIN (NITROSTAT) 0.4 MG SL tablet Place 1 tablet (0.4 mg total) under the tongue every 5 (five) minutes as needed for chest pain. 25 tablet 3   polyethylene glycol (MIRALAX / GLYCOLAX) 17 g packet Take 17 g by mouth daily.     potassium chloride (KLOR-CON) 10 MEQ tablet Take 1 tablet (10 mEq total) by mouth daily as needed (take when you take your fluid pill). 90 tablet 3   rosuvastatin (CRESTOR) 40 MG tablet Take 1 tablet (40 mg total) by mouth daily. 90 tablet 3   sildenafil (REVATIO) 20 MG tablet Take 1 to 5 tablets 1 hour prior to intercourse. 15 tablet 0   Sodium Sulfate-Mag Sulfate-KCl (SUTAB) [phone] MG TABS Use as directed for colonoscopy. MANUFACTURER CODES!! BIN: [id number] PCN: CN GROUP: [id number] MEMBER ID: [id number];TGG AS SECONDARY INSURANCE ;NO PRIOR AUTHORIZATION 24 tablet 0   No current facility-administered medications for this visit.     Allergies:   Propafenone, Codeine, and Penicillins   Social History:  The patient  reports that he quit smoking about 28 years ago. His  smoking use included cigarettes. He has a 8.75 pack-year smoking history. He has never used smokeless tobacco. He reports current alcohol use of about 1.0 standard drink per week. He reports that he does not use drugs.   Family History:   family history includes Colon cancer in his paternal grandmother; Heart disease in his father; Heart disease (age of onset: 81) in his mother; Hypertension in his father and mother; Kidney disease in an other family member; Lung cancer in his father.    Review of Systems: Review of Systems  Constitutional: Negative.   HENT: Negative.    Respiratory: Negative.    Cardiovascular: Negative.   Gastrointestinal: Negative.   Musculoskeletal: Negative.   Neurological: Negative.   All other systems reviewed and are negative.  PHYSICAL EXAM: VS:  BP (!) 150/90 (BP Location: Left Arm, Patient Position: Sitting, Cuff Size:  Normal)   Pulse 70   Ht '5\' 10"'$  (1.778 m)   Wt 245 lb (111.1 kg)   SpO2 97%   BMI 35.15 kg/m  , BMI Body mass index is 35.15 kg/m. Constitutional:  oriented to person, place, and time. No distress.  HENT:  Head: Grossly normal Eyes:  no discharge. No scleral icterus.  Neck: No JVD, no carotid bruits  Cardiovascular: Regular rate and rhythm, no murmurs appreciated Pulmonary/Chest: Clear to auscultation bilaterally, no wheezes or rails Abdominal: Soft.  no distension.  no tenderness.  Musculoskeletal: Normal range of motion Neurological:  normal muscle tone. Coordination normal. No atrophy Skin: Skin warm and dry Psychiatric: normal affect, pleasant  Recent Labs: No results found for requested labs within last 8760 hours.    Lipid Panel Lab Results  Component Value Date   CHOL 108 09/05/2020   HDL 44 09/05/2020   LDLCALC 48 09/05/2020   TRIG 76 09/05/2020      Wt Readings from Last 3 Encounters:  04/21/22 245 lb (111.1 kg)  02/24/22 240 lb (108.9 kg)  03/04/21 246 lb 3.2 oz (111.7 kg)     ASSESSMENT AND PLAN:  Acute diastolic CHF (congestive heart failure) (HCC) - Appreciating more abdominal distention Recommend he try Lasix 20 mg daily sparingly with potassium 10 mill equivalents Weight relatively stable, no leg edema  Bilateral  carotid artery stenosis  Occluded carotids bilaterally Cholesterol at goal  Coronary artery disease involving native coronary artery of native heart without angina pectoris - CABG 2021 Recommend regular walking program Non-smoker, nondiabetic, cholesterol at goal  Chronic atrial fibrillation (HCC)  On anticoagulation, rate well controlled Tolerating Eliquis.  Rate well controlled off beta-blocker  Mixed hyperlipidemia - Plan: EKG 12-Lead  Crestor 40 mg daily with Zetia Numbers at goal  Morbid obesity We have encouraged continued exercise, careful diet management in an effort to lose weight.   Total encounter time more than  30 minutes Greater than 50% was spent in counseling and coordination of care with the patient    Orders Placed This Encounter  Procedures   EKG 12-Lead     Signed, Esmond Plants, M.D., Ph.D. 04/21/2022  [place], [place]

## 2022-04-21 ENCOUNTER — Ambulatory Visit: Payer: Medicare HMO | Admitting: Cardiovascular Disease

## 2022-04-21 ENCOUNTER — Encounter: Payer: Self-pay | Admitting: Cardiovascular Disease

## 2022-04-21 VITALS — BP 150/90 | HR 70 | Ht 70.0 in | Wt 245.0 lb

## 2022-04-21 DIAGNOSIS — I200 Unstable angina: Secondary | ICD-10-CM

## 2022-04-21 DIAGNOSIS — I6523 Occlusion and stenosis of bilateral carotid arteries: Secondary | ICD-10-CM

## 2022-04-21 DIAGNOSIS — I482 Chronic atrial fibrillation, unspecified: Secondary | ICD-10-CM | POA: Diagnosis not present

## 2022-04-21 DIAGNOSIS — I25110 Atherosclerotic heart disease of native coronary artery with unstable angina pectoris: Secondary | ICD-10-CM | POA: Diagnosis not present

## 2022-04-21 DIAGNOSIS — E785 Hyperlipidemia, unspecified: Secondary | ICD-10-CM

## 2022-04-21 DIAGNOSIS — I5032 Chronic diastolic (congestive) heart failure: Secondary | ICD-10-CM | POA: Diagnosis not present

## 2022-04-21 DIAGNOSIS — I739 Peripheral vascular disease, unspecified: Secondary | ICD-10-CM

## 2022-04-21 MED ORDER — POTASSIUM CHLORIDE ER 10 MEQ PO TBCR: 10.0000 meq | EXTENDED_RELEASE_TABLET | Freq: Every day | ORAL | 3 refills | Status: DC | PRN

## 2022-04-21 MED ORDER — FUROSEMIDE 20 MG PO TABS
20.0000 mg | ORAL_TABLET | Freq: Every day | ORAL | 3 refills | Status: DC | PRN
Start: 2022-04-21 — End: 2023-12-27

## 2022-04-21 NOTE — Patient Instructions (Addendum)
Medication Instructions:  Lasix 20 mg daily as needed (take sparingly) Take with potassium 10 meq PRN For ABD swelling  If you need a refill on your cardiac medications before your next appointment, please call your pharmacy.   Lab work: No new labs needed  Testing/Procedures: No new testing needed  Follow-Up: At [facility], you and your health needs are our priority.  As part of our continuing mission to provide you with exceptional heart care, we have created designated Provider Care Teams.  These Care Teams include your primary Cardiologist (physician) and Advanced Practice Providers (APPs -  Physician Assistants and Nurse Practitioners) who all work together to provide you with the care you need, when you need it.  You will need a follow up appointment in 6 months, APP ok  Providers on your designated Care Team:   Murray Hodgkins, NP Christell Faith, PA-C Cadence Kathlen Mody, [place]  COVID-19 Vaccine Information can be found at: [email or web address].uk For questions related to vaccine distribution or appointments, please email [email or web address] or call [phone].

## 2022-05-01 IMAGING — DX DG CHEST 1V PORT
1 series · 2 of 2 positions shown · non-contrast
Comparison: October 29, 2020

CLINICAL DATA: Shortness of breath

EXAM:
PORTABLE CHEST 1 VIEW

[Series 1: chest ap · 0.14mm/px · 2 of 2 slices shown]
[im 1/2]
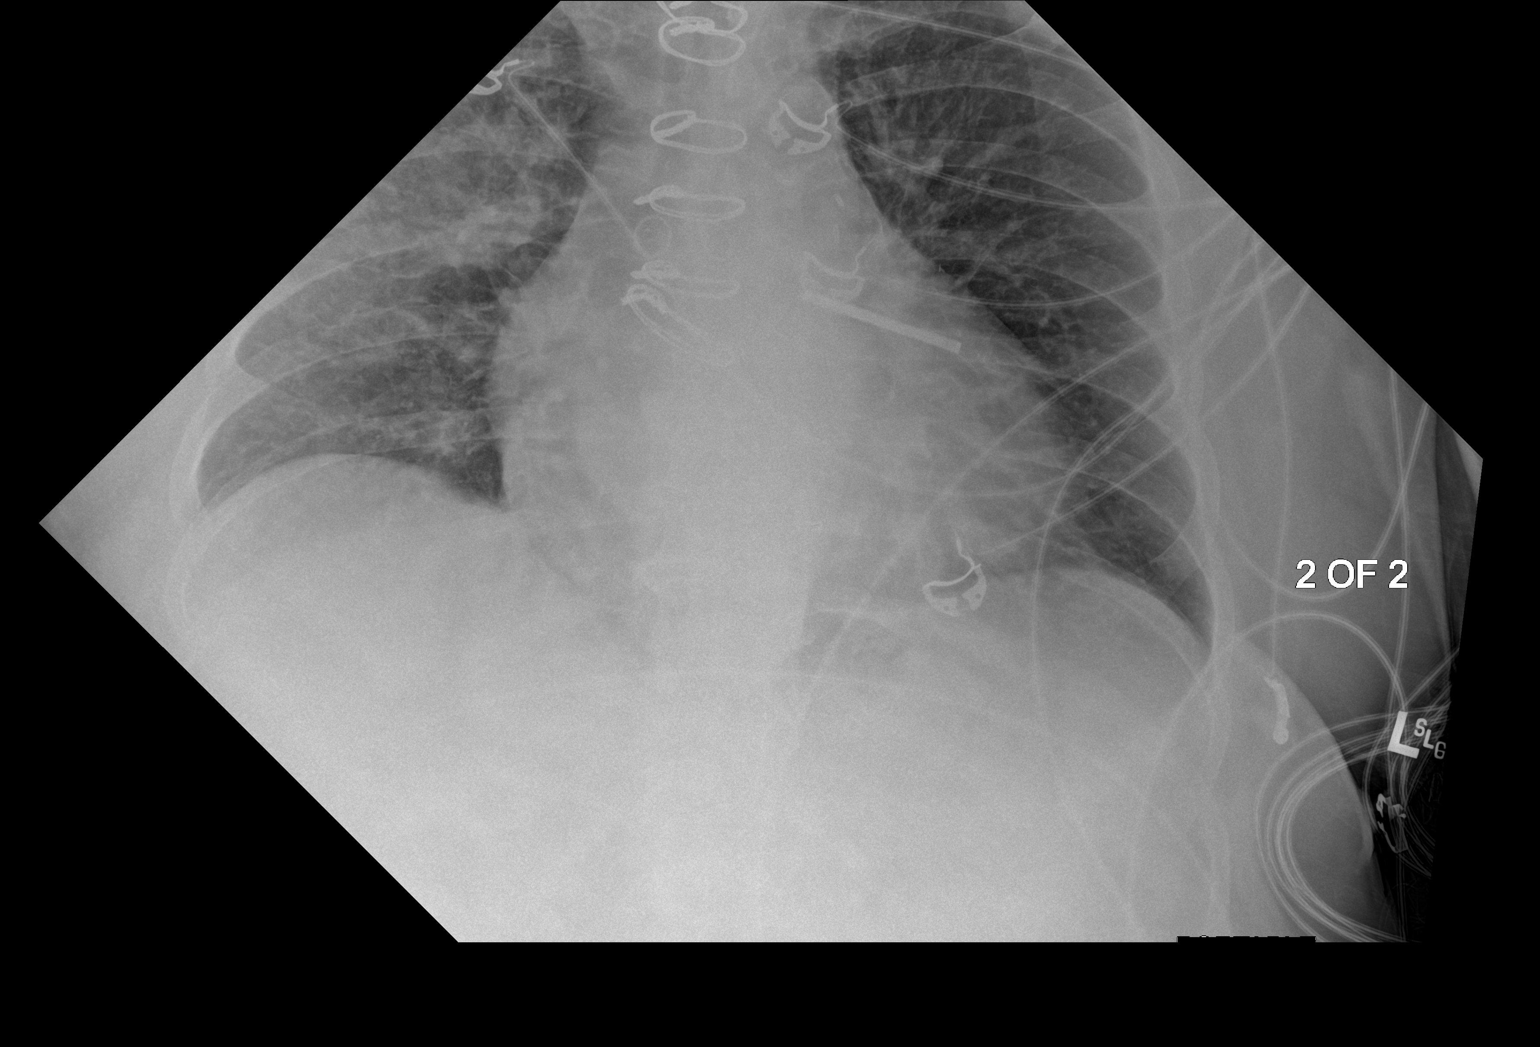
[im 2/2]
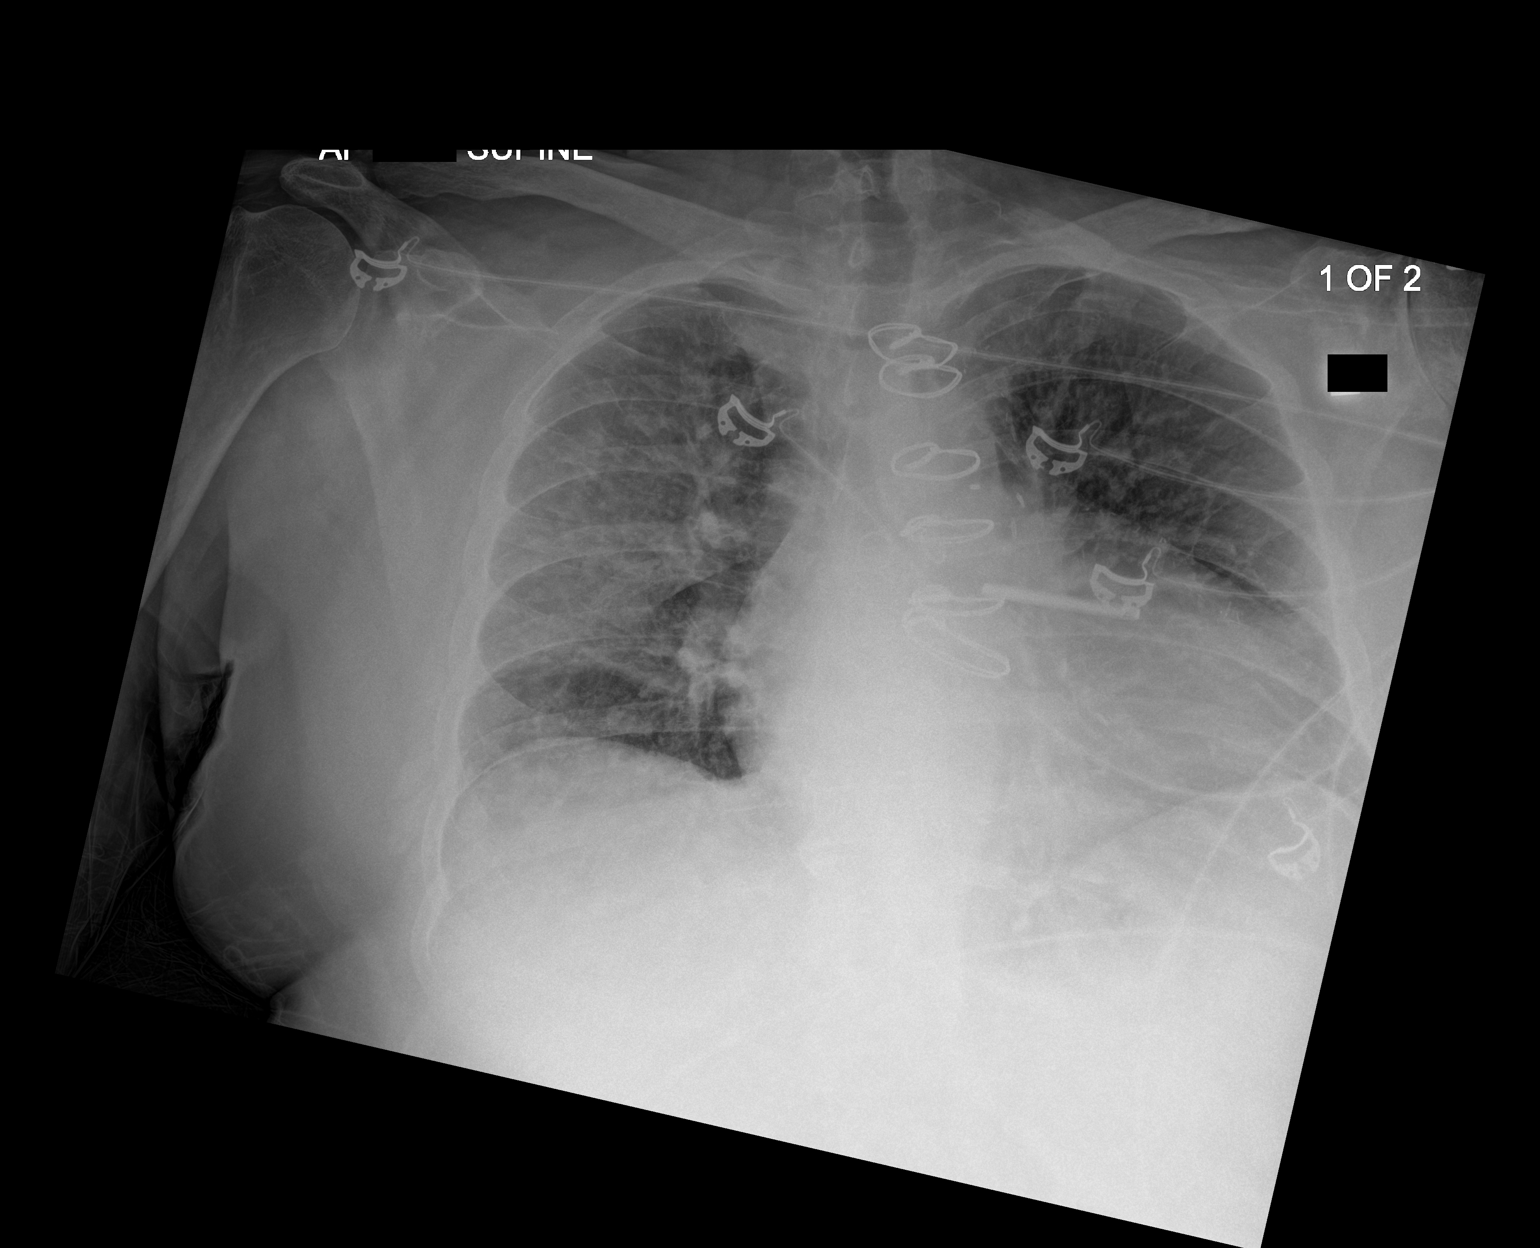

[2 of 2 positions shown; findings below may reference images not displayed]

FINDINGS: Ill-defined airspace opacity is noted in portions of the right upper
lobe and right mid lung region. No consolidation. Left lung clear.

Heart is mildly enlarged with pulmonary venous hypertension. Patient
is status post coronary artery bypass grafting. There is a left
atrial appendage clamp. No adenopathy. No appreciable bone lesions.
IMPRESSION: 1. Ill-defined airspace opacity right upper lobe and right mid lung
regions. Suspect multifocal pneumonia, likely of atypical organism
etiology. Correlation with [id number] status advised given this
appearance.

2. Mild cardiomegaly with pulmonary vascular congestion.
Postoperative changes noted.

## 2022-05-07 LAB — COLOGUARD: COLOGUARD: NEGATIVE

## 2022-05-07 LAB — EXTERNAL GENERIC LAB PROCEDURE: COLOGUARD: NEGATIVE

## 2022-07-14 ENCOUNTER — Ambulatory Visit: Payer: Medicare HMO | Admitting: Dermatology

## 2022-08-10 DIAGNOSIS — E785 Hyperlipidemia, unspecified: Secondary | ICD-10-CM | POA: Diagnosis not present

## 2022-08-10 DIAGNOSIS — F32A Depression, unspecified: Secondary | ICD-10-CM | POA: Diagnosis not present

## 2022-08-10 DIAGNOSIS — D649 Anemia, unspecified: Secondary | ICD-10-CM | POA: Diagnosis not present

## 2022-08-10 DIAGNOSIS — I6523 Occlusion and stenosis of bilateral carotid arteries: Secondary | ICD-10-CM | POA: Diagnosis not present

## 2022-08-10 DIAGNOSIS — I4891 Unspecified atrial fibrillation: Secondary | ICD-10-CM | POA: Diagnosis not present

## 2022-08-10 DIAGNOSIS — I10 Essential (primary) hypertension: Secondary | ICD-10-CM | POA: Diagnosis not present

## 2022-08-10 DIAGNOSIS — Z125 Encounter for screening for malignant neoplasm of prostate: Secondary | ICD-10-CM | POA: Diagnosis not present

## 2022-08-10 DIAGNOSIS — Z6841 Body Mass Index (BMI) 40.0 and over, adult: Secondary | ICD-10-CM | POA: Diagnosis not present

## 2022-08-10 DIAGNOSIS — E669 Obesity, unspecified: Secondary | ICD-10-CM | POA: Diagnosis not present

## 2022-08-17 DIAGNOSIS — I480 Paroxysmal atrial fibrillation: Secondary | ICD-10-CM | POA: Diagnosis not present

## 2022-08-19 DIAGNOSIS — H52223 Regular astigmatism, bilateral: Secondary | ICD-10-CM | POA: Diagnosis not present

## 2022-08-19 DIAGNOSIS — Z135 Encounter for screening for eye and ear disorders: Secondary | ICD-10-CM | POA: Diagnosis not present

## 2022-08-19 DIAGNOSIS — R7309 Other abnormal glucose: Secondary | ICD-10-CM | POA: Diagnosis not present

## 2022-08-19 DIAGNOSIS — H43813 Vitreous degeneration, bilateral: Secondary | ICD-10-CM | POA: Diagnosis not present

## 2022-08-19 DIAGNOSIS — H5213 Myopia, bilateral: Secondary | ICD-10-CM | POA: Diagnosis not present

## 2022-08-19 DIAGNOSIS — H524 Presbyopia: Secondary | ICD-10-CM | POA: Diagnosis not present

## 2022-08-19 DIAGNOSIS — H2513 Age-related nuclear cataract, bilateral: Secondary | ICD-10-CM | POA: Diagnosis not present

## 2022-08-31 DIAGNOSIS — R7989 Other specified abnormal findings of blood chemistry: Secondary | ICD-10-CM | POA: Diagnosis not present

## 2022-08-31 DIAGNOSIS — I10 Essential (primary) hypertension: Secondary | ICD-10-CM | POA: Diagnosis not present

## 2022-09-03 DIAGNOSIS — N179 Acute kidney failure, unspecified: Secondary | ICD-10-CM | POA: Diagnosis not present

## 2022-09-03 DIAGNOSIS — Z6841 Body Mass Index (BMI) 40.0 and over, adult: Secondary | ICD-10-CM | POA: Diagnosis not present

## 2022-09-03 DIAGNOSIS — E669 Obesity, unspecified: Secondary | ICD-10-CM | POA: Diagnosis not present

## 2022-09-03 DIAGNOSIS — I6523 Occlusion and stenosis of bilateral carotid arteries: Secondary | ICD-10-CM | POA: Diagnosis not present

## 2022-09-03 DIAGNOSIS — E785 Hyperlipidemia, unspecified: Secondary | ICD-10-CM | POA: Diagnosis not present

## 2022-09-03 DIAGNOSIS — F32A Depression, unspecified: Secondary | ICD-10-CM | POA: Diagnosis not present

## 2022-09-03 DIAGNOSIS — I10 Essential (primary) hypertension: Secondary | ICD-10-CM | POA: Diagnosis not present

## 2022-09-03 DIAGNOSIS — I4891 Unspecified atrial fibrillation: Secondary | ICD-10-CM | POA: Diagnosis not present

## 2022-09-03 DIAGNOSIS — Z23 Encounter for immunization: Secondary | ICD-10-CM | POA: Diagnosis not present

## 2022-10-04 DIAGNOSIS — K640 First degree hemorrhoids: Secondary | ICD-10-CM | POA: Diagnosis not present

## 2022-10-25 ENCOUNTER — Ambulatory Visit: Payer: Medicare HMO | Attending: Medical | Admitting: Medical

## 2022-10-25 ENCOUNTER — Telehealth: Payer: Self-pay

## 2022-10-25 ENCOUNTER — Encounter: Payer: Self-pay | Admitting: Medical

## 2022-10-25 VITALS — BP 116/70 | HR 70 | Ht 70.0 in | Wt 244.4 lb

## 2022-10-25 DIAGNOSIS — E782 Mixed hyperlipidemia: Secondary | ICD-10-CM | POA: Diagnosis not present

## 2022-10-25 DIAGNOSIS — I25110 Atherosclerotic heart disease of native coronary artery with unstable angina pectoris: Secondary | ICD-10-CM | POA: Diagnosis not present

## 2022-10-25 DIAGNOSIS — I4821 Permanent atrial fibrillation: Secondary | ICD-10-CM | POA: Diagnosis not present

## 2022-10-25 DIAGNOSIS — I5032 Chronic diastolic (congestive) heart failure: Secondary | ICD-10-CM

## 2022-10-25 DIAGNOSIS — I6523 Occlusion and stenosis of bilateral carotid arteries: Secondary | ICD-10-CM

## 2022-10-25 MED ORDER — APIXABAN 5 MG PO TABS: 5.0000 mg | ORAL_TABLET | Freq: Two times a day (BID) | ORAL | 0 refills | Status: AC

## 2022-10-25 NOTE — Telephone Encounter (Signed)
Encounter opened in error

## 2022-10-25 NOTE — Progress Notes (Addendum)
Cardiology Office Note:    Date:  10/25/2022   ID:  Jeremy Reese, DOB [birth year], MRN [id number]  PCP:  Leonel Ramsay, MD  [facility] Cardiologist:  Ida Rogue, MD  [facility] Electrophysiologist:  None   Referring MD: Leonel Ramsay, MD   Chief Complaint: 6 month follow-up  History of Present Illness:    Jeremy Reese is a 71 y.o. male with a hx of CAD s/p remote PCI and CABGx3 in 2021 with left atrial appendage clipping, TIA 2012, bilateral occlusion of ICA monitored by Dr. Lucky Cowboy, permanent Afib, HLD, chronic back pain who presents for 6 month follow-up.   Cardiac cath in 2010 with PCI to the left circumflex. Per previous documentation cardiac cath at that time with 95% proximal circumflex, 60% mid LAD.  Per previous documentation transesophageal echo on 08/11/2010 with PFO, EF 50%, left atrium moderately dilated, moderate MR, mild to moderate TR.  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  He had previous cardioversion 01/2015.  Patient was seen 09/05/2020 in the office reporting worsening angina for the last month.  Patient was set up for cardiac catheterization.  Cardiac cath 09/12/2020 showed 95% stenosis ostial circumflex to proximal circumflex, 70% stenosis ostial LAD to proximal LAD, 60% mid LAD stenosis.  Patient ultimately underwent CABG x 3 and left atrial appendage clipping.  He was last seen 04/22/22 with abdominal distention and recommended he try lasix.   Today, the patient is overall doing well. He reports numbness in his right arm. He had carpal tunnel surgery a long time ago, and this feels similar to that. The pain comes and goes, it is not constant, he plans on seeing PCP for further recommendations. He denies chest pain, shortness of breath, lower leg edema, orthopnea, pnd. He can't walk far  due to back pain. EKG shows rate controlled Afib. He needs refill of Eliquis for 30 days. He takes lasix '20mg'$  as needed for swelling.   Past Medical History:  Diagnosis Date   Atrial flutter, paroxysmal ([place])    Carotid artery disease without cerebral infarction [facility])    Coronary artery disease 2011   Flatback syndrome of thoracolumbar region    H/O calcium pyrophosphate deposition disease (CPPD)    History of TIA (transient ischemic attack)    Hyperlipidemia    Paroxysmal A-fib (HCC)    Spinal stenosis of lumbar region without neurogenic claudication     Past Surgical History:  Procedure Laterality Date   BILATERAL CARPAL TUNNEL RELEASE     CARDIAC CATHETERIZATION     CIRCUMCISION, NON-NEWBORN     CLIPPING OF ATRIAL APPENDAGE N/A 09/18/2020   Procedure: CLIPPING OF ATRIAL APPENDAGE USING 76 ATRICURE LAA  EXCLUSION SYSTEM;  Surgeon: Ivin Poot, MD;  Location: [place];  Service: Open Heart Surgery;  Laterality: N/A;   CORONARY ANGIOPLASTY WITH STENT PLACEMENT  2011   CORONARY ARTERY BYPASS GRAFT N/A 09/18/2020   Procedure: CORONARY ARTERY BYPASS GRAFTING (CABG)X 3, ON PUMP, USING LEFT INTERAL MAMMARY ARTERY AND ENDOSCOPICALLY HARVESTED RIGHT GREATER SAPHENOUS VEIN. LIMA TO LAD, SVG TO OM, SVG TO PD;  Surgeon: Ivin Poot, MD;  Location: [place];  Service: Open Heart Surgery;  Laterality: N/A;   ENDOVEIN HARVEST OF GREATER SAPHENOUS VEIN Right 09/18/2020   Procedure: ENDOVEIN HARVEST OF GREATER SAPHENOUS VEIN;  Surgeon: Ivin Poot, MD;  Location:  [place] OR;  Service: Open Heart Surgery;  Laterality: Right;   LEFT HEART CATH AND CORONARY ANGIOGRAPHY Left 09/12/2020   Procedure: LEFT HEART CATH AND CORONARY ANGIOGRAPHY;  Surgeon: Minna Merritts, MD;  Location: [place] CV [facility];  Service: Cardiovascular;  Laterality: Left;   TEE WITHOUT CARDIOVERSION N/A 09/18/2020   Procedure: TRANSESOPHAGEAL ECHOCARDIOGRAM (TEE);  Surgeon: Prescott Gum, Collier Salina, MD;  Location: [place];  Service:  Open Heart Surgery;  Laterality: N/A;    Current Medications: Current Meds  Medication Sig   aspirin EC 81 MG tablet Take 1 tablet (81 mg total) by mouth daily.   buPROPion (WELLBUTRIN XL) 300 MG 24 hr tablet Take 1 tablet (300 mg total) by mouth daily.   carvedilol (COREG) 25 MG tablet Take 1 tablet (25 mg total) by mouth 2 (two) times daily with a meal.   ezetimibe (ZETIA) 10 MG tablet TAKE ONE (1) TABLET EACH DAY   furosemide (LASIX) 20 MG tablet Take 1 tablet (20 mg total) by mouth daily as needed (abdominal swelling).   gabapentin (NEURONTIN) 300 MG capsule Take 2 capsules (600 mg total) by mouth 3 (three) times daily. (Patient taking differently: Take 300 mg by mouth 3 (three) times daily.)   LORazepam (ATIVAN) 1 MG tablet Take 1 tablet (1 mg total) by mouth 2 (two) times daily as needed for anxiety or sleep. Script must last 30 days from fill date.   nitroGLYCERIN (NITROSTAT) 0.4 MG SL tablet Place 1 tablet (0.4 mg total) under the tongue every 5 (five) minutes as needed for chest pain.   polyethylene glycol (MIRALAX / GLYCOLAX) 17 g packet Take 17 g by mouth daily.   potassium chloride (KLOR-CON) 10 MEQ tablet Take 1 tablet (10 mEq total) by mouth daily as needed (take when you take your fluid pill).   rosuvastatin (CRESTOR) 40 MG tablet Take 1 tablet (40 mg total) by mouth daily.   Sodium Sulfate-Mag Sulfate-KCl (SUTAB) [phone] MG TABS Use as directed for colonoscopy. MANUFACTURER CODES!! BIN: [id number] PCN: CN GROUP: [id number] MEMBER ID: [id number];MEQ AS SECONDARY INSURANCE ;NO PRIOR AUTHORIZATION   [DISCONTINUED] ELIQUIS 5 MG TABS tablet TAKE ONE TABLET TWICE DAILY     Allergies:   Propafenone, Codeine, and Penicillins   Social History   Socioeconomic History   Marital status: Married    Spouse name: Not on file   Number of children: Not on file   Years of education: Not on file   Highest education level: Not on file  Occupational History   Not on file  Tobacco Use    Smoking status: Former    Packs/day: 0.25    Years: 35.00    Total pack years: 8.75    Types: Cigarettes    Quit date: 11/29/1993    Years since quitting: 28.9   Smokeless tobacco: Never  Vaping Use   Vaping Use: Never used  Substance and Sexual Activity   Alcohol use: Yes    Alcohol/week: 1.0 standard drink of alcohol    Types: 1 Cans of beer per week   Drug use: No   Sexual activity: Not Currently  Other Topics Concern   Not on file  Social History Narrative   Not on file   Social Determinants of Health   Financial Resource Strain: Not on file  Food Insecurity: Not on file  Transportation Needs: Not on file  Physical Activity: Not on file  Stress: Not on file  Social Connections: Not on file     Family History:  The patient's family history includes Colon cancer in his paternal grandmother; Heart disease in his father; Heart disease (age of onset: 43) in his mother; Hypertension in his father and mother; Kidney disease in an other family member; Lung cancer in his father. There is no history of Liver disease, Stomach cancer, Esophageal cancer, or Pancreatic cancer.  ROS:   Please see the history of present illness.     All other systems reviewed and are negative.  EKGs/Labs/Other Studies Reviewed:    The following studies were reviewed today:  Echo 08/2020  1. Left ventricular ejection fraction, by estimation, is 60 to 65%. The  left ventricle has normal function. The left ventricle has no regional  wall motion abnormalities. There is moderate left ventricular hypertrophy.  Left ventricular diastolic  parameters are indeterminate.   2. Right ventricular systolic function is normal. The right ventricular  size is normal.   3. Left atrial size was severely dilated.   4. Right atrial size was mildly dilated.   5. The mitral valve is grossly normal. Mild mitral valve regurgitation.  No evidence of mitral stenosis. Moderate mitral annular calcification.   6. The aortic  valve is abnormal. There is severe calcifcation of the  aortic valve. Aortic valve regurgitation is not visualized. Aortic valve  sclerosis/calcification is present, without any evidence of aortic  stenosis. Mean systolic gradient 9 mmHg.   Cardiac cath 08/2020 Ost Cx to Prox Cx lesion is 95% stenosed. Ost LAD to Prox LAD lesion is 70% stenosed. Mid LAD lesion is 60% stenosed.  EKG:  EKG is ordered today.  The ekg ordered today demonstrates NSR 70bpm, LAD, nonspecific T wave changes  Recent Labs: No results found for requested labs within last 365 days.  Recent Lipid Panel    Component Value Date/Time   CHOL 108 09/05/2020 0950   TRIG 76 09/05/2020 0950   HDL 44 09/05/2020 0950   CHOLHDL 2.5 09/05/2020 0950   LDLCALC 48 09/05/2020 0950     Physical Exam:    VS:  BP 116/70 (BP Location: Left Arm, Patient Position: Sitting, Cuff Size: Normal)   Pulse 70   Ht '5\' 10"'$  (1.778 m)   Wt 244 lb 6.4 oz (110.9 kg)   SpO2 97%   BMI 35.07 kg/m     Wt Readings from Last 3 Encounters:  10/25/22 244 lb 6.4 oz (110.9 kg)  04/21/22 245 lb (111.1 kg)  02/24/22 240 lb (108.9 kg)     GEN:  Well nourished, well developed in no acute distress HEENT: Normal NECK: No JVD; + carotid bruits LYMPHATICS: No lymphadenopathy CARDIAC: Irreg IRreg, no murmurs, rubs, gallops RESPIRATORY:  Clear to auscultation without rales, wheezing or rhonchi  ABDOMEN: Soft, non-tender, non-distended MUSCULOSKELETAL:  No edema; No deformity  SKIN: Warm and dry NEUROLOGIC:  Alert and oriented x 3 PSYCHIATRIC:  Normal affect   ASSESSMENT:    1. Chronic diastolic heart failure ([place])   2. Bilateral carotid artery stenosis   3. Coronary artery disease involving native coronary artery of native heart with unstable angina pectoris ([place])   4. Permanent atrial fibrillation ([place])   5. Hyperlipidemia, mixed    PLAN:    In order of problems listed above:  Chronic diastolic heart failure The patient is euvolemic  on exam. He takes lasix '20mg'$  only as needed for swelling. Echo in 2021 showed LVEF 60-65%, no WMA, moderate LVH, severely dilated biatrium, mild MR. Continue Coreg and lasix PRN.  Bilateral carotid artery stenosis Bilateral bruits  on exam today. I will re-check a carotid US. He is on Aspirin and Eliquis. Continue Crestor and Zetia. I recommended he make an appointment with Dr. Lucky Cowboy.   CAD s/p CABG in 2021 The patient denies anginal symptoms. No further ischemic work-up indicated at this time. He reports he has been on both Aspirin and Eliquis for a long time, I won't make any changes today. Continue Aspirin, Eliquis, Zetia, and Crestor.   Permanent Afib EKG shows rate controlled Afib. Continue Eliquis '5mg'$  BID, we will send in a 30 day refill of this. Continue Coreg for rate control.   HLD LDL 50 in 02/2021. Continue Crestor and Zetia.   Disposition: Follow up in 6 month(s) with MD/APP   Signed, Anterio Scheel Ninfa Meeker, PA-C  10/25/2022 4:27 PM    [facility]

## 2022-10-25 NOTE — Patient Instructions (Signed)
Medication Instructions:  Your physician recommends that you continue on your current medications as directed. Please refer to the Current Medication list given to you today.  *If you need a refill on your cardiac medications before your next appointment, please call your pharmacy*   Lab Work: None ordered  If you have labs (blood work) drawn today and your tests are completely normal, you will receive your results only by: [place] (if you have MyChart) OR A paper copy in the mail If you have any lab test that is abnormal or we need to change your treatment, we will call you to review the results.   Testing/Procedures: Your physician has requested that you have a carotid duplex. This test is an ultrasound of the carotid arteries in your neck. It looks at blood flow through these arteries that supply the brain with blood. Allow one hour for this exam. There are no restrictions or special instructions.   Follow-Up: At [facility], you and your health needs are our priority.  As part of our continuing mission to provide you with exceptional heart care, we have created designated Provider Care Teams.  These Care Teams include your primary Cardiologist (physician) and Advanced Practice Providers (APPs -  Physician Assistants and Nurse Practitioners) who all work together to provide you with the care you need, when you need it.  We recommend signing up for the patient portal called "MyChart".  Sign up information is provided on this After Visit Summary.  MyChart is used to connect with patients for Virtual Visits (Telemedicine).  Patients are able to view lab/test results, encounter notes, upcoming appointments, etc.  Non-urgent messages can be sent to your provider as well.   To learn more about what you can do with MyChart, go to [email or web address].    Your next appointment:   6 month(s)  The format for your next appointment:   In Person  Provider:   You may see  Ida Rogue, MD or one of the following Advanced Practice Providers on your designated Care Team:   Murray Hodgkins, NP Christell Faith, PA-C Cadence Kathlen Mody, PA-C Gerrie Nordmann, NP   Important Information About Sugar

## 2022-11-11 ENCOUNTER — Ambulatory Visit: Payer: Medicare HMO | Attending: Medical

## 2022-11-11 DIAGNOSIS — I6523 Occlusion and stenosis of bilateral carotid arteries: Secondary | ICD-10-CM

## 2022-12-03 ENCOUNTER — Encounter: Payer: Self-pay | Admitting: Medical

## 2022-12-03 ENCOUNTER — Ambulatory Visit: Payer: Medicare HMO | Attending: Medical | Admitting: Medical

## 2022-12-03 VITALS — BP 146/78 | HR 71 | Ht 70.0 in | Wt 245.6 lb

## 2022-12-03 DIAGNOSIS — I4821 Permanent atrial fibrillation: Secondary | ICD-10-CM | POA: Diagnosis not present

## 2022-12-03 DIAGNOSIS — I2510 Atherosclerotic heart disease of native coronary artery without angina pectoris: Secondary | ICD-10-CM | POA: Diagnosis not present

## 2022-12-03 DIAGNOSIS — I6523 Occlusion and stenosis of bilateral carotid arteries: Secondary | ICD-10-CM

## 2022-12-03 DIAGNOSIS — E782 Mixed hyperlipidemia: Secondary | ICD-10-CM

## 2022-12-03 DIAGNOSIS — I5032 Chronic diastolic (congestive) heart failure: Secondary | ICD-10-CM

## 2022-12-03 NOTE — Patient Instructions (Addendum)
Medication Instructions:  - Your physician recommends that you continue on your current medications as directed. Please refer to the Current Medication list given to you today.  *If you need a refill on your cardiac medications before your next appointment, please call your pharmacy*   Lab Work: - none ordered  If you have labs (blood work) drawn today and your tests are completely normal, you will receive your results only by: [place] (if you have MyChart) OR A paper copy in the mail If you have any lab test that is abnormal or we need to change your treatment, we will call you to review the results.   Testing/Procedures: - You have been referred to: [facility] (Dr. Lucky Cowboy) Number is [phone]     Follow-Up: At [facility], you and your health needs are our priority.  As part of our continuing mission to provide you with exceptional heart care, we have created designated Provider Care Teams.  These Care Teams include your primary Cardiologist (physician) and Advanced Practice Providers (APPs -  Physician Assistants and Nurse Practitioners) who all work together to provide you with the care you need, when you need it.   Your next appointment:   3 month(s)  The format for your next appointment:   In Person  Provider:   You may see Ida Rogue, MD or one of the following Advanced Practice Providers on your designated Care Team:    Cadence Kathlen Mody, [place]    Other Instructions N/a  Important Information About Sugar

## 2022-12-03 NOTE — Progress Notes (Signed)
Cardiology Office Note:    Date:  12/03/2022   ID:  Jeremy Reese, DOB [birth year], MRN [id number]  PCP:  Jeremy Ramsay, MD  [facility] Cardiologist:  Jeremy Rogue, MD  [facility] Electrophysiologist:  None   Referring MD: Jeremy Ramsay, MD   Chief Complaint: 1-2 month follow-up  History of Present Illness:    Jeremy Reese is a 72 y.o. male with a hx of CAD s/p remote PCI and CABGx3 in 2021 with left atrial appendage clipping, TIA 2012, bilateral occlusion of ICA monitored by Dr. Lucky Reese, permanent Afib, HLD, chronic back pain who presents for 1-2 month follow-up.    Cardiac cath in 2010 with PCI to the left circumflex. Per previous documentation cardiac cath at that time with 95% proximal circumflex, 60% mid LAD.  Per previous documentation transesophageal echo on 08/11/2010 with PFO, EF 50%, left atrium moderately dilated, moderate MR, mild to moderate TR.  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  He had previous cardioversion 01/2015.   Patient was seen 09/05/2020 in the office reporting worsening angina for the last month.  Patient was set up for cardiac catheterization.  Cardiac cath 09/12/2020 showed 95% stenosis ostial circumflex to proximal circumflex, 70% stenosis ostial LAD to proximal LAD, 60% mid LAD stenosis.  Patient ultimately underwent CABG x 3 and left atrial appendage clipping.  Last seen 10/25/22 and was overall doing well. He had b/l bruits on exam and an US of the carotid was ordered. His Eliquis was refilled.   Today, the patient report he has not seen vascular yet, he reports he saw Dr. Lucky Reese many year ago. No chest pain or shortness of breath. He is unable to walk due to back pain. He is in NSR. He is taking Eliquis daily. No swelling on his feet.   Past Medical History:  Diagnosis Date    Atrial flutter, paroxysmal ([place])    Carotid artery disease without cerebral infarction [facility])    Coronary artery disease 2011   Flatback syndrome of thoracolumbar region    H/O calcium pyrophosphate deposition disease (CPPD)    History of TIA (transient ischemic attack)    Hyperlipidemia    Paroxysmal A-fib (HCC)    Spinal stenosis of lumbar region without neurogenic claudication     Past Surgical History:  Procedure Laterality Date   BILATERAL CARPAL TUNNEL RELEASE     CARDIAC CATHETERIZATION     CIRCUMCISION, NON-NEWBORN     CLIPPING OF ATRIAL APPENDAGE N/A 09/18/2020   Procedure: CLIPPING OF ATRIAL APPENDAGE USING 42 ATRICURE LAA  EXCLUSION SYSTEM;  Surgeon: Ivin Poot, MD;  Location: [place];  Service: Open Heart Surgery;  Laterality: N/A;   CORONARY ANGIOPLASTY WITH STENT PLACEMENT  2011   CORONARY ARTERY BYPASS GRAFT N/A 09/18/2020   Procedure: CORONARY ARTERY BYPASS GRAFTING (CABG)X 3, ON PUMP, USING LEFT INTERAL MAMMARY ARTERY AND ENDOSCOPICALLY HARVESTED RIGHT GREATER SAPHENOUS VEIN. LIMA TO LAD, SVG TO OM, SVG TO PD;  Surgeon: Ivin Poot, MD;  Location: [place];  Service: Open Heart Surgery;  Laterality: N/A;   ENDOVEIN HARVEST OF GREATER SAPHENOUS VEIN Right 09/18/2020   Procedure: ENDOVEIN HARVEST OF GREATER SAPHENOUS VEIN;  Surgeon: Ivin Poot, MD;  Location: [place];  Service: Open Heart Surgery;  Laterality: Right;   LEFT HEART CATH AND CORONARY ANGIOGRAPHY Left 09/12/2020   Procedure:  LEFT HEART CATH AND CORONARY ANGIOGRAPHY;  Surgeon: Minna Merritts, MD;  Location: [place] [facility];  Service: Cardiovascular;  Laterality: Left;   TEE WITHOUT CARDIOVERSION N/A 09/18/2020   Procedure: TRANSESOPHAGEAL ECHOCARDIOGRAM (TEE);  Surgeon: Prescott Gum, Collier Salina, MD;  Location: [place];  Service: Open Heart Surgery;  Laterality: N/A;    Current Medications: Current Meds  Medication Sig   apixaban (ELIQUIS) 5 MG TABS tablet Take 1 tablet (5 mg total) by mouth 2 (two)  times daily.   aspirin EC 81 MG tablet Take 1 tablet (81 mg total) by mouth daily.   buPROPion (WELLBUTRIN XL) 300 MG 24 hr tablet Take 1 tablet (300 mg total) by mouth daily.   carvedilol (COREG) 25 MG tablet Take 1 tablet (25 mg total) by mouth 2 (two) times daily with a meal.   ezetimibe (ZETIA) 10 MG tablet TAKE ONE (1) TABLET EACH DAY   furosemide (LASIX) 20 MG tablet Take 1 tablet (20 mg total) by mouth daily as needed (abdominal swelling).   gabapentin (NEURONTIN) 300 MG capsule Take 2 capsules (600 mg total) by mouth 3 (three) times daily. (Patient taking differently: Take 300 mg by mouth 3 (three) times daily.)   LORazepam (ATIVAN) 1 MG tablet Take 1 tablet (1 mg total) by mouth 2 (two) times daily as needed for anxiety or sleep. Script must last 30 days from fill date.   nitroGLYCERIN (NITROSTAT) 0.4 MG SL tablet Place 1 tablet (0.4 mg total) under the tongue every 5 (five) minutes as needed for chest pain.   polyethylene glycol (MIRALAX / GLYCOLAX) 17 g packet Take 17 g by mouth daily.   potassium chloride (KLOR-CON) 10 MEQ tablet Take 1 tablet (10 mEq total) by mouth daily as needed (take when you take your fluid pill).   rosuvastatin (CRESTOR) 40 MG tablet Take 1 tablet (40 mg total) by mouth daily.   sildenafil (REVATIO) 20 MG tablet Take 1 to 5 tablets 1 hour prior to intercourse.   Sodium Sulfate-Mag Sulfate-KCl (SUTAB) [phone] MG TABS Use as directed for colonoscopy. MANUFACTURER CODES!! BIN: [id number] PCN: CN GROUP: [id number] MEMBER ID: [id number];LHT AS SECONDARY INSURANCE ;NO PRIOR AUTHORIZATION     Allergies:   Propafenone, Codeine, and Penicillins   Social History   Socioeconomic History   Marital status: Married    Spouse name: Not on file   Number of children: Not on file   Years of education: Not on file   Highest education level: Not on file  Occupational History   Not on file  Tobacco Use   Smoking status: Former    Packs/day: 0.25    Years: 35.00     Total pack years: 8.75    Types: Cigarettes    Quit date: 11/29/1993    Years since quitting: 29.0   Smokeless tobacco: Never  Vaping Use   Vaping Use: Never used  Substance and Sexual Activity   Alcohol use: Yes    Alcohol/week: 1.0 standard drink of alcohol    Types: 1 Cans of beer per week   Drug use: No   Sexual activity: Not Currently  Other Topics Concern   Not on file  Social History Narrative   Not on file   Social Determinants of Health   Financial Resource Strain: Not on file  Food Insecurity: Not on file  Transportation Needs: Not on file  Physical Activity: Not on file  Stress: Not on file  Social Connections: Not on file     Family  History: The patient's family history includes Colon cancer in his paternal grandmother; Heart disease in his father; Heart disease (age of onset: 64) in his mother; Hypertension in his father and mother; Kidney disease in an other family member; Lung cancer in his father. There is no history of Liver disease, Stomach cancer, Esophageal cancer, or Pancreatic cancer.  ROS:   Please see the history of present illness.     All other systems reviewed and are negative.  EKGs/Labs/Other Studies Reviewed:    The following studies were reviewed today:  Echo 08/2020  1. Left ventricular ejection fraction, by estimation, is 60 to 65%. The  left ventricle has normal function. The left ventricle has no regional  wall motion abnormalities. There is moderate left ventricular hypertrophy.  Left ventricular diastolic  parameters are indeterminate.   2. Right ventricular systolic function is normal. The right ventricular  size is normal.   3. Left atrial size was severely dilated.   4. Right atrial size was mildly dilated.   5. The mitral valve is grossly normal. Mild mitral valve regurgitation.  No evidence of mitral stenosis. Moderate mitral annular calcification.   6. The aortic valve is abnormal. There is severe calcifcation of the  aortic  valve. Aortic valve regurgitation is not visualized. Aortic valve  sclerosis/calcification is present, without any evidence of aortic  stenosis. Mean systolic gradient 9 mmHg.    Cardiac cath 08/2020 Ost Cx to Prox Cx lesion is 95% stenosed. Ost LAD to Prox LAD lesion is 70% stenosed. Mid LAD lesion is 60% stenosed.  EKG:  EKG is ordered today.  The ekg ordered today demonstrates Afib, 71bpm, septal q waves, no ST/T wave changes  Recent Labs: No results found for requested labs within last 365 days.  Recent Lipid Panel    Component Value Date/Time   CHOL 108 09/05/2020 0950   TRIG 76 09/05/2020 0950   HDL 44 09/05/2020 0950   CHOLHDL 2.5 09/05/2020 0950   LDLCALC 48 09/05/2020 0950    Physical Exam:    VS:  BP (!) 146/78 (BP Location: Left Arm, Patient Position: Sitting, Cuff Size: Normal)   Pulse 71   Ht '5\' 10"'$  (1.778 m)   Wt 245 lb 9.6 oz (111.4 kg)   SpO2 99%   BMI 35.24 kg/m     Wt Readings from Last 3 Encounters:  12/03/22 245 lb 9.6 oz (111.4 kg)  10/25/22 244 lb 6.4 oz (110.9 kg)  04/21/22 245 lb (111.1 kg)     GEN:  Well nourished, well developed in no acute distress HEENT: Normal NECK: No JVD; No carotid bruits LYMPHATICS: No lymphadenopathy CARDIAC: Irreg IRreg, no murmurs, rubs, gallops RESPIRATORY:  Clear to auscultation without rales, wheezing or rhonchi  ABDOMEN: Soft, non-tender, non-distended MUSCULOSKELETAL:  No edema; No deformity  SKIN: Warm and dry NEUROLOGIC:  Alert and oriented x 3 PSYCHIATRIC:  Normal affect   ASSESSMENT:    1. Chronic diastolic heart failure ([place])   2. Bilateral carotid artery stenosis   3. Permanent atrial fibrillation ([place])   4. Coronary artery disease involving native coronary artery of native heart without angina pectoris   5. Hyperlipidemia, mixed    PLAN:    In order of problems listed above:  Chronic diastolic heart failure The patient is euvolemic on exam today. The patient takes lasix '20mg'$  as needed for  swelling. Echo in 2021 showed LVEF 60-65%, no WMA, moderate LVH, severely dilated, mildly dilated RA, mild MR, aortic valve sclerosis/calcification. Continue Coreg  and lasix PRN.  B/l Carotid artery stenosis US carotids showed showed total occlusion of the right ICA, ECA>50% stenosis, left sided was not scanned. I will refer back to [facility] for management. Continue Aspirin and Crestor.  CAD s/p CABG in 2021 The patient denies chest pain. No further ischemic work-up indicated at this time. HE has been on both ASA and Eliquis for a long time. Continue Aspirin, Coreg, Zetia and Crestor.   Permanent Afib EKG shows rate controlled Afib. Continue Eliquis '5mg'$  BID for stroke ppx. Continue Coreg for rate control.   HLD LDL 50 win 02/2021. Continue Crestor and Zetia.   Disposition: Follow up in 3 month(s) with MD/APP     Signed, Jaequan Propes Ninfa Meeker, PA-C  12/03/2022 1:07 PM    [facility]

## 2022-12-08 NOTE — Addendum Note (Signed)
Addended by: James Ivanoff D on: 12/08/2022 08:16 AM   Modules accepted: Orders

## 2023-01-12 ENCOUNTER — Ambulatory Visit: Payer: Medicare HMO | Admitting: Dermatology

## 2023-01-26 ENCOUNTER — Ambulatory Visit: Payer: Medicare HMO | Admitting: Dermatology

## 2023-01-26 DIAGNOSIS — L409 Psoriasis, unspecified: Secondary | ICD-10-CM | POA: Diagnosis not present

## 2023-01-26 DIAGNOSIS — Z7189 Other specified counseling: Secondary | ICD-10-CM

## 2023-01-26 DIAGNOSIS — L821 Other seborrheic keratosis: Secondary | ICD-10-CM

## 2023-01-26 DIAGNOSIS — Z79899 Other long term (current) drug therapy: Secondary | ICD-10-CM | POA: Diagnosis not present

## 2023-01-26 DIAGNOSIS — L820 Inflamed seborrheic keratosis: Secondary | ICD-10-CM | POA: Diagnosis not present

## 2023-01-26 MED ORDER — MOMETASONE FUROATE 0.1 % EX CREA: TOPICAL_CREAM | CUTANEOUS | 3 refills | Status: DC

## 2023-01-26 NOTE — Progress Notes (Signed)
   New Patient Visit  Subjective  Jeremy Reese is a 72 y.o. male who presents for the following: Skin Problem (The patient has spots on his face to be evaluated, some may be new or changing and the patient has concerns that these could be cancer. ). Patient c/o rash on his face for several months, he think it could be psoriasis, family hx of psoriasis. Positive for joint pain for several years  The following portions of the chart were reviewed this encounter and updated as appropriate:   Tobacco  Allergies  Meds  Problems  Med Hx  Surg Hx  Fam Hx     Review of Systems:  No other skin or systemic complaints except as noted in HPI or Assessment and Plan.  Objective  Well appearing patient in no apparent distress; mood and affect are within normal limits.  A focused examination was performed including face. Relevant physical exam findings are noted in the Assessment and Plan.  left cheek/mandible x 3 (3) Stuck-on, waxy, tan-brown papules --Discussed benign etiology and prognosis.   right postauricular Erythema and scale   Assessment & Plan  Inflamed seborrheic keratosis (3) left cheek/mandible x 3 Symptomatic, irritating, patient would like treated.  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 It is associated with an increased risk of inflammatory bowel disease, heart disease, non-alcoholic fatty liver disease, and depression.  Treatments include light and laser treatments; topical medications; and systemic medications including oral and injectables.   Start Mometasone cream apply to affected skin once a day 5 days a week  We will send a referral to Rheumatologist  mometasone (ELOCON) 0.1 % cream - right postauricular Apply to affected skin once a day 5 days a week  Related Procedures Ambulatory referral to Rheumatology  Seborrheic Keratoses - Stuck-on, waxy, tan-brown papules and/or plaques  - Benign-appearing - Discussed benign etiology and prognosis. - Observe - Call for any changes  Return in about 3 months (around 04/26/2023) for SKs, psoriasis .  IMarye Round, CMA, am acting as scribe for Sarina Ser, MD .  Documentation: I have reviewed the above documentation for accuracy and completeness, and I agree with the above.  Sarina Ser, MD

## 2023-01-26 NOTE — Patient Instructions (Addendum)
 Cryotherapy Aftercare  Wash gently with soap and water everyday.   Apply Vaseline and Band-Aid daily until healed. Due to recent changes in healthcare laws, you may see results of your pathology and/or laboratory studies on MyChart before the doctors have had a chance to review them. We understand that in some cases there may be results that are confusing or concerning to you. Please understand that not all results are received at the same time and often the doctors may need to interpret multiple results in order to provide you with the best plan of care or course of treatment. Therefore, we ask that you please give us 2 business days to thoroughly review all your results before contacting the office for clarification. Should we see a critical lab result, you will be contacted sooner.   If You Need Anything After Your Visit  If you have any questions or concerns for your doctor, please call our main line at [phone] and press option 4 to reach your doctor's medical assistant. If no one answers, please leave a voicemail as directed and we will return your call as soon as possible. Messages left after 4 pm will be answered the following business day.   You may also send us a message via MyChart. We typically respond to MyChart messages within 1-2 business days.  For prescription refills, please ask your pharmacy to contact our office. Our fax number is [phone].  If you have an urgent issue when the clinic is closed that cannot wait until the next business day, you can page your doctor at the number below.    Please note that while we do our best to be available for urgent issues outside of office hours, we are not available 24/7.   If you have an urgent issue and are unable to reach us, you may choose to seek medical care at your doctor's office, retail clinic, urgent care center, or emergency room.  If you have a medical emergency, please immediately call 911 or go to the emergency  department.  Pager Numbers  - Dr. Kowalski: [phone]  - Dr. Moye: [phone]  - Dr. Stewart: [phone]  In the event of inclement weather, please call our main line at [phone] for an update on the status of any delays or closures.  Dermatology Medication Tips: Please keep the boxes that topical medications come in in order to help keep track of the instructions about where and how to use these. Pharmacies typically print the medication instructions only on the boxes and not directly on the medication tubes.   If your medication is too expensive, please contact our office at [phone] option 4 or send us a message through MyChart.   We are unable to tell what your co-pay for medications will be in advance as this is different depending on your insurance coverage. However, we may be able to find a substitute medication at lower cost or fill out paperwork to get insurance to cover a needed medication.   If a prior authorization is required to get your medication covered by your insurance company, please allow us 1-2 business days to complete this process.  Drug prices often vary depending on where the prescription is filled and some pharmacies may offer cheaper prices.  The website www.goodrx.com contains coupons for medications through different pharmacies. The prices here do not account for what the cost may be with help from insurance (it may be cheaper with your insurance), but the website can give you the   price if you did not use any insurance.  - You can print the associated coupon and take it with your prescription to the pharmacy.  - You may also stop by our office during regular business hours and pick up a GoodRx coupon card.  - If you need your prescription sent electronically to a different pharmacy, notify our office through [place] MyChart or by phone at [phone] option 4.     Si Usted Necesita Algo Despus de Su Visita  Tambin puede enviarnos un  mensaje a travs de MyChart. Por lo general respondemos a los mensajes de MyChart en el transcurso de 1 a 2 das hbiles.  Para renovar recetas, por favor pida a su farmacia que se ponga en contacto con nuestra oficina. Nuestro nmero de fax es el [phone].  Si tiene un asunto urgente cuando la clnica est cerrada y que no puede esperar hasta el siguiente da hbil, puede llamar/localizar a su doctor(a) al nmero que aparece a continuacin.   Por favor, tenga en cuenta que aunque hacemos todo lo posible para estar disponibles para asuntos urgentes fuera del horario de oficina, no estamos disponibles las 24 horas del da, los 7 das de la semana.   Si tiene un problema urgente y no puede comunicarse con nosotros, puede optar por buscar atencin mdica  en el consultorio de su doctor(a), en una clnica privada, en un centro de atencin urgente o en una sala de emergencias.  Si tiene una emergencia mdica, por favor llame inmediatamente al 911 o vaya a la sala de emergencias.  Nmeros de bper  - Dr. Kowalski: [phone]  - Dra. Moye: [phone]  - Dra. Stewart: [phone]  En caso de inclemencias del tiempo, por favor llame a nuestra lnea principal al [phone] para una actualizacin sobre el estado de cualquier retraso o cierre.  Consejos para la medicacin en dermatologa: Por favor, guarde las cajas en las que vienen los medicamentos de uso tpico para ayudarle a seguir las instrucciones sobre dnde y cmo usarlos. Las farmacias generalmente imprimen las instrucciones del medicamento slo en las cajas y no directamente en los tubos del medicamento.   Si su medicamento es muy caro, por favor, pngase en contacto con nuestra oficina llamando al [phone] y presione la opcin 4 o envenos un mensaje a travs de MyChart.   No podemos decirle cul ser su copago por los medicamentos por adelantado ya que esto es diferente dependiendo de la cobertura de su seguro. Sin embargo,  es posible que podamos encontrar un medicamento sustituto a menor costo o llenar un formulario para que el seguro cubra el medicamento que se considera necesario.   Si se requiere una autorizacin previa para que su compaa de seguros cubra su medicamento, por favor permtanos de 1 a 2 das hbiles para completar este proceso.  Los precios de los medicamentos varan con frecuencia dependiendo del lugar de dnde se surte la receta y alguna farmacias pueden ofrecer precios ms baratos.  El sitio web www.goodrx.com tiene cupones para medicamentos de diferentes farmacias. Los precios aqu no tienen en cuenta lo que podra costar con la ayuda del seguro (puede ser ms barato con su seguro), pero el sitio web puede darle el precio si no utiliz ningn seguro.  - Puede imprimir el cupn correspondiente y llevarlo con su receta a la farmacia.  - Tambin puede pasar por nuestra oficina durante el horario de atencin regular y recoger una tarjeta de cupones de GoodRx.  - Si necesita que   su receta se enve electrnicamente a una farmacia diferente, informe a nuestra oficina a travs de MyChart de [place] o por telfono llamando al [phone] y presione la opcin 4.  

## 2023-02-02 ENCOUNTER — Encounter: Payer: Self-pay | Admitting: Dermatology

## 2023-02-15 DIAGNOSIS — E785 Hyperlipidemia, unspecified: Secondary | ICD-10-CM | POA: Diagnosis not present

## 2023-02-15 DIAGNOSIS — E669 Obesity, unspecified: Secondary | ICD-10-CM | POA: Diagnosis not present

## 2023-02-15 DIAGNOSIS — I129 Hypertensive chronic kidney disease with stage 1 through stage 4 chronic kidney disease, or unspecified chronic kidney disease: Secondary | ICD-10-CM | POA: Diagnosis not present

## 2023-02-15 DIAGNOSIS — F32A Depression, unspecified: Secondary | ICD-10-CM | POA: Diagnosis not present

## 2023-02-15 DIAGNOSIS — I6529 Occlusion and stenosis of unspecified carotid artery: Secondary | ICD-10-CM | POA: Diagnosis not present

## 2023-02-15 DIAGNOSIS — I4891 Unspecified atrial fibrillation: Secondary | ICD-10-CM | POA: Diagnosis not present

## 2023-02-15 DIAGNOSIS — N189 Chronic kidney disease, unspecified: Secondary | ICD-10-CM | POA: Diagnosis not present

## 2023-02-15 DIAGNOSIS — R7303 Prediabetes: Secondary | ICD-10-CM | POA: Diagnosis not present

## 2023-02-15 DIAGNOSIS — Z0000 Encounter for general adult medical examination without abnormal findings: Secondary | ICD-10-CM | POA: Diagnosis not present

## 2023-02-22 ENCOUNTER — Encounter (INDEPENDENT_AMBULATORY_CARE_PROVIDER_SITE_OTHER): Payer: Self-pay | Admitting: Vascular Surgery

## 2023-02-22 ENCOUNTER — Ambulatory Visit (INDEPENDENT_AMBULATORY_CARE_PROVIDER_SITE_OTHER): Payer: Medicare HMO | Admitting: Vascular Surgery

## 2023-02-22 VITALS — BP 185/122 | HR 76 | Resp 18 | Ht 71.0 in | Wt 247.0 lb

## 2023-02-22 DIAGNOSIS — E785 Hyperlipidemia, unspecified: Secondary | ICD-10-CM | POA: Diagnosis not present

## 2023-02-22 DIAGNOSIS — I6523 Occlusion and stenosis of bilateral carotid arteries: Secondary | ICD-10-CM | POA: Diagnosis not present

## 2023-02-22 DIAGNOSIS — I10 Essential (primary) hypertension: Secondary | ICD-10-CM | POA: Diagnosis not present

## 2023-02-22 NOTE — Progress Notes (Signed)
Subjective:    Patient ID: Jeremy Reese, male    DOB: 1951/06/22, 72 y.o.   MRN: [id number] Chief Complaint  Patient presents with   New Patient (Initial Visit)    np.  per referral. consult. bilateral carotid stenosis. US carotids(12.14.23) showed showed total occlusion of the right ICA, ECA>50% stenosis, left sided was not scanned. I will refer back to [facility] for management. referred by furth, cadence.      Jeremy Reese is a 72 year old male who is referred by Kathlen Mody, PA-C in regards to carotid artery stenosis.  The patient had a carotid artery duplex on 11/11/2022 which was not completely completed due to notes of issues with pain from the patient.  The right side was evaluated but the left was not.  The right noted a ICA occlusion.  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  The left vertebral artery was noted as the dominant vessel.  The patient denies any TIA or amaurosis fugax-like symptoms.  His largest issues are with his back area with difficulty with ambulation.    Review of Systems  Musculoskeletal:  Positive for gait problem.  All other systems reviewed and are negative.      Objective:   Physical Exam Vitals reviewed.  HENT:     Head: Normocephalic.  Neck:     Vascular: No carotid bruit.  Cardiovascular:     Rate and Rhythm: Normal rate.     Pulses:          Radial pulses are 1+ on the right side and 1+ on the left side.  Pulmonary:     Effort: Pulmonary effort is normal.  Skin:    General: Skin is warm and dry.  Neurological:     Mental Status: He is alert and oriented to person, place, and time.     Gait: Gait abnormal.  Psychiatric:        Mood and Affect: Mood normal.        Behavior: Behavior normal.        Thought Content: Thought content normal.        Judgment: Judgment normal.     BP (!) 185/122 (BP Location: Left Arm)   Pulse 76   Resp 18   Ht 5\' 11"   (1.803 m)   Wt 247 lb (112 kg)   BMI 34.45 kg/m   Past Medical History:  Diagnosis Date   Atrial flutter, paroxysmal (HCC)    Carotid artery disease without cerebral infarction [facility])    Coronary artery disease 2011   Flatback syndrome of thoracolumbar region    H/O calcium pyrophosphate deposition disease (CPPD)    History of TIA (transient ischemic attack)    Hyperlipidemia    Paroxysmal A-fib (HCC)    Spinal stenosis of lumbar region without neurogenic claudication     Social History   Socioeconomic History   Marital status: Married    Spouse name: Not on file   Number of children: Not on file   Years of education: Not on file   Highest education level: Not on file  Occupational History   Not on file  Tobacco Use   Smoking status: Former    Packs/day: 0.25    Years: 35.00    Additional pack years: 0.00    Total pack years: 8.75    Types: Cigarettes    Quit date: 11/29/1993    Years since quitting: 29.2  Smokeless tobacco: Never  Vaping Use   Vaping Use: Never used  Substance and Sexual Activity   Alcohol use: Yes    Alcohol/week: 1.0 standard drink of alcohol    Types: 1 Cans of beer per week   Drug use: No   Sexual activity: Not Currently  Other Topics Concern   Not on file  Social History Narrative   Not on file   Social Determinants of Health   Financial Resource Strain: Not on file  Food Insecurity: Not on file  Transportation Needs: Not on file  Physical Activity: Not on file  Stress: Not on file  Social Connections: Not on file  Intimate Partner Violence: Not on file    Past Surgical History:  Procedure Laterality Date   BILATERAL CARPAL [place], NON-NEWBORN     CLIPPING OF ATRIAL APPENDAGE N/A 09/18/2020   Procedure: CLIPPING OF ATRIAL APPENDAGE USING 45 ATRICURE LAA  [place];  Surgeon: Ivin Poot, MD;  Location: [place];  Service: Open Heart Surgery;  Laterality: N/A;    CORONARY ANGIOPLASTY WITH STENT PLACEMENT  2011   CORONARY ARTERY BYPASS GRAFT N/A 09/18/2020   Procedure: CORONARY ARTERY BYPASS GRAFTING (CABG)X 3, ON PUMP, USING LEFT INTERAL MAMMARY ARTERY AND ENDOSCOPICALLY HARVESTED RIGHT GREATER SAPHENOUS VEIN. LIMA TO LAD, SVG TO OM, SVG TO PD;  Surgeon: Ivin Poot, MD;  Location: [place];  Service: Open Heart Surgery;  Laterality: N/A;   ENDOVEIN HARVEST OF GREATER SAPHENOUS VEIN Right 09/18/2020   Procedure: ENDOVEIN HARVEST OF GREATER SAPHENOUS VEIN;  Surgeon: Ivin Poot, MD;  Location: [place];  Service: Open Heart Surgery;  Laterality: Right;   LEFT HEART CATH AND CORONARY ANGIOGRAPHY Left 09/12/2020   Procedure: LEFT HEART CATH AND CORONARY ANGIOGRAPHY;  Surgeon: Minna Merritts, MD;  Location: [place] [facility];  Service: Cardiovascular;  Laterality: Left;   TEE WITHOUT CARDIOVERSION N/A 09/18/2020   Procedure: TRANSESOPHAGEAL ECHOCARDIOGRAM (TEE);  Surgeon: Prescott Gum, Collier Salina, MD;  Location: [place];  Service: Open Heart Surgery;  Laterality: N/A;    Family History  Problem Relation Age of Onset   Heart disease Mother 60       CABG x 3    Hypertension Mother    Hypertension Father    Heart disease Father    Lung cancer Father    Kidney disease Other    Colon cancer Paternal Grandmother    Liver disease Neg Hx    Stomach cancer Neg Hx    Esophageal cancer Neg Hx    Pancreatic cancer Neg Hx     Allergies  Allergen Reactions   Propafenone Anxiety   Codeine Nausea Only   Penicillins Rash       Latest Ref Rng & Units 12/29/2020    8:28 AM 12/26/2020    3:17 PM 12/20/2020   11:42 AM  CBC  WBC 4.0 - 10.5 K/uL 6.2  6.5  7.0   Hemoglobin 13.0 - 17.0 g/dL 13.4  11.7  13.6   Hematocrit 39.0 - 52.0 % 42.1  37.2  41.6   Platelets 150 - 400 K/uL 155  152  146       CMP     Component Value Date/Time   NA 142 12/14/2020 0701   NA 140 09/05/2020 0950   K 3.7 12/14/2020 0701   CL 102 12/14/2020 0701   CO2 30 12/14/2020 0701    GLUCOSE 102 (H)  12/14/2020 0701   BUN 43 (H) 12/14/2020 0701   BUN 13 09/05/2020 0950   CREATININE 1.29 (H) 12/14/2020 0701   CALCIUM 9.4 12/14/2020 0701   PROT 6.7 12/14/2020 0701   ALBUMIN 3.8 12/14/2020 0701   AST 69 (H) 12/14/2020 0701   ALT 98 (H) 12/14/2020 0701   ALKPHOS 57 12/14/2020 0701   BILITOT 1.1 12/14/2020 0701   GFRNONAA >60 12/14/2020 0701   GFRAA 100 09/05/2020 0950     No results found.     Assessment & Plan:   1. Bilateral carotid artery stenosis Based on the CT scan done in 2015 the patient does have noted bilateral internal carotid artery occlusions.  His most recent carotid duplex on 11/11/2022 correlates that with the right ICA however the left side was not scanned.  The previous CTA in 2015 shows that the left vertebral is his dominant.  Based on this I feel will be prudent to evaluate with an updated carotid duplex of both sides in order to evaluate the patient's vertebral arteries.  Currently the patient on adequate medication regiment with statin, aspirin and Eliquis.  2. Primary hypertension Continue antihypertensive medications as already ordered, these medications have been reviewed and there are no changes at this time.  3. Hyperlipidemia, unspecified hyperlipidemia type Continue statin as ordered and reviewed, no changes at this time   Current Outpatient Medications on File Prior to Visit  Medication Sig Dispense Refill   apixaban (ELIQUIS) 5 MG TABS tablet Take 1 tablet (5 mg total) by mouth 2 (two) times daily. 60 tablet 0   aspirin EC 81 MG tablet Take 1 tablet (81 mg total) by mouth daily. 90 tablet 3   buPROPion (WELLBUTRIN XL) 300 MG 24 hr tablet Take 1 tablet (300 mg total) by mouth daily. 30 tablet 1   carvedilol (COREG) 25 MG tablet Take 1 tablet (25 mg total) by mouth 2 (two) times daily with a meal. 60 tablet 3   ezetimibe (ZETIA) 10 MG tablet TAKE ONE (1) TABLET EACH DAY 30 tablet 3   furosemide (LASIX) 20 MG tablet Take 1 tablet  (20 mg total) by mouth daily as needed (abdominal swelling). 90 tablet 3   gabapentin (NEURONTIN) 300 MG capsule Take 2 capsules (600 mg total) by mouth 3 (three) times daily. (Patient taking differently: Take 300 mg by mouth 3 (three) times daily.) 180 capsule 1   LORazepam (ATIVAN) 1 MG tablet Take 1 tablet (1 mg total) by mouth 2 (two) times daily as needed for anxiety or sleep. Script must last 30 days from fill date. 60 tablet 1   mometasone (ELOCON) 0.1 % cream Apply to affected skin once a day 5 days a week 45 g 3   nitroGLYCERIN (NITROSTAT) 0.4 MG SL tablet Place 1 tablet (0.4 mg total) under the tongue every 5 (five) minutes as needed for chest pain. 25 tablet 3   polyethylene glycol (MIRALAX / GLYCOLAX) 17 g packet Take 17 g by mouth daily.     potassium chloride (KLOR-CON) 10 MEQ tablet Take 1 tablet (10 mEq total) by mouth daily as needed (take when you take your fluid pill). 90 tablet 3   rosuvastatin (CRESTOR) 40 MG tablet Take 1 tablet (40 mg total) by mouth daily. 90 tablet 3   sildenafil (REVATIO) 20 MG tablet Take 1 to 5 tablets 1 hour prior to intercourse. 15 tablet 0   Sodium Sulfate-Mag Sulfate-KCl (SUTAB) [phone] MG TABS Use as directed for colonoscopy. MANUFACTURER CODES!! Kara [id number]  PCN: CN GROUP: [id number] MEMBER ID: [id number] AS SECONDARY INSURANCE ;NO PRIOR AUTHORIZATION 24 tablet 0   No current facility-administered medications on file prior to visit.    There are no Patient Instructions on file for this visit. No follow-ups on file.   Kris Hartmann, NP

## 2023-03-01 ENCOUNTER — Other Ambulatory Visit (INDEPENDENT_AMBULATORY_CARE_PROVIDER_SITE_OTHER): Payer: Self-pay | Admitting: Nurse Practitioner

## 2023-03-01 DIAGNOSIS — I6523 Occlusion and stenosis of bilateral carotid arteries: Secondary | ICD-10-CM

## 2023-03-07 NOTE — Progress Notes (Unsigned)
Cardiology Office Note  Date:  03/08/2023   ID:  Reva Boreslexander W Wickey, DOB 1951/06/23, MRN [id number]  PCP:  Mick SellFitzgerald, David P, MD   Chief Complaint  Patient presents with   Follow-up    Patient denies new or acute cardiac problems/concerns today.      HPI:  Mr. Maple HudsonMoser is a pleasant 72 year old gentleman with  CAD, cath 2010, PCI LCX obesity,  history of TIA in 2012,  bilateral occlusion of his internal carotid arteries, monitored by Dr. Wyn Quakerew,  atrial fibrillation dating back to 2011  severe back pain not a surgical candidate per the surgeons given his severe bilateral carotid disease Old CVA on CT scan head in 2015 09/18/2020.  He underwent CABG x 3  who presents for follow-up of his chronic atrial fibrillation, PAD, CAD with recent CABG for left main disease  Last seen by myself in clinic 5/23 Seen by one of our providers January 50,024  Lives with wife at home Uses a walker to get around, no falls Reports that his legs feel weak, feels the Crestor is making him weak Watches TV all day, plays with dog No regular x-ray sinus program  Does not check blood pressure at home Recent blood pressures running high including today initial pressure 200 systolic Rare lasix, denies leg swelling  Labs reviewed A1C 6.5 Total chol 86 CR 1.7 BUN 27  Eliquis price elevated, 45/month  EKG personally reviewed by myself on todays visit  shows atrial fibrillation with ventricular rate 87 bpm, no significant ST-T wave changes  Other past medical history reviewed Admitted to the hospital with suicidal ideation November 10, 2020 Inpatient stay in behavioral health Worsening depression  Past cardiac history reviewed cardiac catheterization October 2021 Images very concerning for critical ostial/proximal left circumflex disease estimated 95% or greater LAD with moderate to severe ostial/proximal disease though difficult to visualize, and 60% mid disease  Cath 09/12/2020 Ost Cx to Prox Cx  lesion is 95% stenosed. Ost LAD to Prox LAD lesion is 70% stenosed. Mid LAD lesion is 60% stenosed.  worsening anginal symptoms over the past several weeks with clear escalation of his isosorbide to relieve his pressure Critical ostial left circumflex disease of a large vessel (stent stenosis, heavily calcified), nondominant right, also with disease of the LAD Evaluated for CABG.   Echo: EF 60%, calcified aortic valve  operating room details 09/18/2020.  He underwent CABG x 3 utilizing LIMA to LAD, SVG to OM1, and SVG to PDA.  He had clipping of his LA appendage with a 45 mm Atricure Clip.  In the past he has tried higher dose pravastatin 80 mg and had side effects including fatigue, weakness, muscle ache. He is able to tolerate 40 mg daily.  Carotid ultrasound reviewed with him from 01/14/2015 showing bilateral internal carotid occlusion, no significant change from ultrasound dated June 2015   Prior studies show cardioversion 02/25/2015 Transesophageal echo 08/11/2010 with PFO noted, ejection fraction 50%, left atrium moderately dilated, moderate MR, mild to moderate TR    PMH:   has a past medical history of Atrial flutter, paroxysmal, Carotid artery disease without cerebral infarction, Coronary artery disease (2011), Flatback syndrome of thoracolumbar region, H/O calcium pyrophosphate deposition disease (CPPD), History of TIA (transient ischemic attack), Hyperlipidemia, Paroxysmal A-fib, and Spinal stenosis of lumbar region without neurogenic claudication.  PSH:    Past Surgical History:  Procedure Laterality Date   BILATERAL CARPAL TUNNEL RELEASE     CARDIAC CATHETERIZATION     CIRCUMCISION, NON-NEWBORN  CLIPPING OF ATRIAL APPENDAGE N/A 09/18/2020   Procedure: CLIPPING OF ATRIAL APPENDAGE USING 45 ATRICURE LAA  [facility];  Surgeon: Kerin Perna, MD;  Location: [facility] OR;  Service: Open Heart Surgery;  Laterality: N/A;   CORONARY ANGIOPLASTY WITH STENT PLACEMENT  2011    CORONARY ARTERY BYPASS GRAFT N/A 09/18/2020   Procedure: CORONARY ARTERY BYPASS GRAFTING (CABG)X 3, ON PUMP, USING LEFT INTERAL MAMMARY ARTERY AND ENDOSCOPICALLY HARVESTED RIGHT GREATER SAPHENOUS VEIN. LIMA TO LAD, SVG TO OM, SVG TO PD;  Surgeon: Kerin Perna, MD;  Location: [facility] OR;  Service: Open Heart Surgery;  Laterality: N/A;   ENDOVEIN HARVEST OF GREATER SAPHENOUS VEIN Right 09/18/2020   Procedure: ENDOVEIN HARVEST OF GREATER SAPHENOUS VEIN;  Surgeon: Kerin Perna, MD;  Location[facility] OR;  Service: Open Heart Surgery;  Laterality: Right;   LEFT HEART CATH AND CORONARY ANGIOGRAPHY Left 09/12/2020   Procedure: LEFT HEART CATH AND CORONARY ANGIOGRAPHY;  Surgeon: Antonieta Iba, MD;  Location: [facility] INVASIVE CV LAB;  Service: Cardiovascular;  Laterality: Left;   TEE WITHOUT CARDIOVERSION N/A 09/18/2020   Procedure: TRANSESOPHAGEAL ECHOCARDIOGRAM (TEE);  Surgeon: Donata Clay, Theron Arista, MD;  Location: [facility] OR;  Service: Open Heart Surgery;  Laterality: N/A;    Current Outpatient Medications  Medication Sig Dispense Refill   apixaban (ELIQUIS) 5 MG TABS tablet Take 1 tablet (5 mg total) by mouth 2 (two) times daily. 60 tablet 0   aspirin EC 81 MG tablet Take 1 tablet (81 mg total) by mouth daily. 90 tablet 3   buPROPion (WELLBUTRIN XL) 300 MG 24 hr tablet Take 1 tablet (300 mg total) by mouth daily. 30 tablet 1   carvedilol (COREG) 25 MG tablet Take 1 tablet (25 mg total) by mouth 2 (two) times daily with a meal. 60 tablet 3   ezetimibe (ZETIA) 10 MG tablet TAKE ONE (1) TABLET EACH DAY 30 tablet 3   furosemide (LASIX) 20 MG tablet Take 1 tablet (20 mg total) by mouth daily as needed (abdominal swelling). 90 tablet 3   gabapentin (NEURONTIN) 300 MG capsule Take 2 capsules (600 mg total) by mouth 3 (three) times daily. (Patient taking differently: Take 300 mg by mouth 3 (three) times daily.) 180 capsule 1   LORazepam (ATIVAN) 1 MG tablet Take 1 tablet (1 mg total) by mouth 2 (two) times daily as  needed for anxiety or sleep. Script must last 30 days from fill date. 60 tablet 1   mometasone (ELOCON) 0.1 % cream Apply to affected skin once a day 5 days a week 45 g 3   nitroGLYCERIN (NITROSTAT) 0.4 MG SL tablet Place 1 tablet (0.4 mg total) under the tongue every 5 (five) minutes as needed for chest pain. 25 tablet 3   polyethylene glycol (MIRALAX / GLYCOLAX) 17 g packet Take 17 g by mouth daily.     potassium chloride (KLOR-CON) 10 MEQ tablet Take 1 tablet (10 mEq total) by mouth daily as needed (take when you take your fluid pill). 90 tablet 3   rosuvastatin (CRESTOR) 40 MG tablet Take 1 tablet (40 mg total) by mouth daily. 90 tablet 3   sildenafil (REVATIO) 20 MG tablet Take 1 to 5 tablets 1 hour prior to intercourse. 15 tablet 0   Sodium Sulfate-Mag Sulfate-KCl (SUTAB) [phone] MG TABS Use as directed for colonoscopy. MANUFACTURER CODES!! BIN: [id number] PCN: CN GROUP: [id number] MEMBER ID: [id number];BMW AS SECONDARY INSURANCE ;NO PRIOR AUTHORIZATION 24 tablet 0   No current facility-administered medications for this visit.  Allergies:   Propafenone, Codeine, and Penicillins   Social History:  The patient  reports that he quit smoking about 29 years ago. His smoking use included cigarettes. He has a 8.75 pack-year smoking history. He has never used smokeless tobacco. He reports current alcohol use of about 1.0 standard drink of alcohol per week. He reports that he does not use drugs.   Family History:   family history includes Colon cancer in his paternal grandmother; Heart disease in his father; Heart disease (age of onset: 5) in his mother; Hypertension in his father and mother; Kidney disease in an other family member; Lung cancer in his father.    Review of Systems: Review of Systems  Constitutional: Negative.   HENT: Negative.    Respiratory: Negative.    Cardiovascular: Negative.   Gastrointestinal: Negative.   Musculoskeletal: Negative.   Neurological: Negative.    All other systems reviewed and are negative.   PHYSICAL EXAM: VS:  BP (!) 190/90   Pulse 87   Ht 5\' 11"  (1.803 m)   Wt 240 lb 3.2 oz (109 kg)   SpO2 98%   BMI 33.50 kg/m  , BMI Body mass index is 33.5 kg/m. Constitutional:  oriented to person, place, and time. No distress.  HENT:  Head: Grossly normal Eyes:  no discharge. No scleral icterus.  Neck: No JVD, no carotid bruits  Cardiovascular: Irregularly irregular,  no murmurs appreciated Pulmonary/Chest: Clear to auscultation bilaterally, no wheezes or rails Abdominal: Soft.  no distension.  no tenderness.  Musculoskeletal: Normal range of motion Neurological:  normal muscle tone. Coordination normal. No atrophy Skin: Skin warm and dry Psychiatric: normal affect, pleasant  Recent Labs: No results found for requested labs within last 365 days.    Lipid Panel Lab Results  Component Value Date   CHOL 108 09/05/2020   HDL 44 09/05/2020   LDLCALC 48 09/05/2020   TRIG 76 09/05/2020      Wt Readings from Last 3 Encounters:  03/08/23 240 lb 3.2 oz (109 kg)  02/22/23 247 lb (112 kg)  12/03/22 245 lb 9.6 oz (111.4 kg)     ASSESSMENT AND PLAN:  Acute diastolic CHF (congestive heart failure) (HCC) - Appears euvolemic on today's visit, taking Lasix as needed Weight relatively stable, no leg edema Denies shortness of breath on exertion  Bilateral  carotid artery stenosis  Occluded carotids bilaterally Followed by vein and vascular Cholesterol at goal  Coronary artery disease involving native coronary artery of native heart without angina pectoris - CABG 2021 Recommend regular walking program, currently walks with a walker Non-smoker, nondiabetic, cholesterol at goal  Chronic atrial fibrillation (HCC)  On anticoagulation, rate well controlled Tolerating Eliquis.  Rate well controlled off beta-blocker  Mixed hyperlipidemia - Plan: EKG 12-Lead  Crestor 40 mg daily with Zetia Feels high-dose Crestor affecting his  leg strength, recommended he decrease dose of Crestor down to 20 with Zetia  Morbid obesity Weight stable but elevated, deconditioned We have encouraged continued exercise, careful diet management in an effort to lose weight.  Essential hypertension Blood pressure markedly elevated on today's visit Recommend he add amlodipine 5 olmesartan 20 combo pill with his Coreg 25 twice daily Elevated with vascular visit Recommend close monitoring of blood pressure at home and call us with numbers  Total encounter time more than 30 minutes Greater than 50% was spent in counseling and coordination of care with the patient    No orders of the defined types were placed in this encounter.  Signed, Dossie Arbour, M.D., Ph.D. 03/08/2023  [facility] [place], [place] [phone]

## 2023-03-08 ENCOUNTER — Ambulatory Visit: Payer: Medicare HMO | Attending: Cardiovascular Disease | Admitting: Cardiovascular Disease

## 2023-03-08 ENCOUNTER — Encounter: Payer: Self-pay | Admitting: Cardiovascular Disease

## 2023-03-08 VITALS — BP 165/90 | HR 87 | Ht 71.0 in | Wt 240.2 lb

## 2023-03-08 DIAGNOSIS — I482 Chronic atrial fibrillation, unspecified: Secondary | ICD-10-CM

## 2023-03-08 DIAGNOSIS — I739 Peripheral vascular disease, unspecified: Secondary | ICD-10-CM | POA: Diagnosis not present

## 2023-03-08 DIAGNOSIS — I5032 Chronic diastolic (congestive) heart failure: Secondary | ICD-10-CM

## 2023-03-08 DIAGNOSIS — I2510 Atherosclerotic heart disease of native coronary artery without angina pectoris: Secondary | ICD-10-CM

## 2023-03-08 DIAGNOSIS — E785 Hyperlipidemia, unspecified: Secondary | ICD-10-CM | POA: Diagnosis not present

## 2023-03-08 DIAGNOSIS — E782 Mixed hyperlipidemia: Secondary | ICD-10-CM | POA: Diagnosis not present

## 2023-03-08 DIAGNOSIS — I6523 Occlusion and stenosis of bilateral carotid arteries: Secondary | ICD-10-CM

## 2023-03-08 MED ORDER — AMLODIPINE-OLMESARTAN 5-20 MG PO TABS: 1.0000 | ORAL_TABLET | Freq: Every day | ORAL | 6 refills | Status: DC

## 2023-03-08 MED ORDER — ROSUVASTATIN CALCIUM 40 MG PO TABS: 20.0000 mg | ORAL_TABLET | Freq: Every day | ORAL | 3 refills | Status: DC

## 2023-03-08 NOTE — Patient Instructions (Addendum)
Medication Instructions:  Reduce the crestor down to 20 mg daily See if strength gets better  Please start amlodipine-olmesartan 5/20 mg daily  Monitor blood pressure at home, goal 130   If you need a refill on your cardiac medications before your next appointment, please call your pharmacy.   Lab work: No new labs needed  Testing/Procedures: No new testing needed  Follow-Up: At [facility], you and your health needs are our priority.  As part of our continuing mission to provide you with exceptional heart care, we have created designated Provider Care Teams.  These Care Teams include your primary Cardiologist (physician) and Advanced Practice Providers (APPs -  Physician Assistants and Nurse Practitioners) who all work together to provide you with the care you need, when you need it.  You will need a follow up appointment in 6 months  Providers on your designated Care Team:   Nicolasa Ducking, NP Eula Listen, PA-C Cadence Fransico Michael, [place]  COVID-19 Vaccine Information can be found at: [email or web address] For questions related to vaccine distribution or appointments, please email [email or web address] or call [phone].   Amlodipine; Olmesartan Tablets What is this medication? AMLODIPINE; OLMESARTAN (am LOE di peen; all mi SAR tan) treats high blood pressure. It works by relaxing blood vessels, which decreases the amount of work the heart has to do. It is a combination of a calcium channel blocker and an ARB. This medicine may be used for other purposes; ask your health care provider or pharmacist if you have questions. COMMON BRAND NAME(S): AZOR What should I tell my care team before I take this medication? They need to know if you have any of these conditions: Heart problems, such as heart failure or aortic stenosis Kidney disease Liver disease An unusual or allergic reaction to amlodipine, olmesartan, other  medications, foods, dyes, or preservatives Pregnant or trying to get pregnant Breast-feeding How should I use this medication? Take this medication by mouth. Take it as directed on the prescription label at the same time every day. You can take it with or without food. If it upsets your stomach, take it with food. Keep taking it unless your care team tells you to stop. Talk to your care team about the use of this medication in children. Special care may be needed. Overdosage: If you think you have taken too much of this medicine contact a poison control center or emergency room at once. NOTE: This medicine is only for you. Do not share this medicine with others. What if I miss a dose? If you miss a dose, take it as soon as you can. If it is almost time for your next dose, take only that dose. Do not take double or extra doses. What may interact with this medication? This medication may interact with the following: Eplerenone Grapefruit Medications used for sleep during surgery Melatonin Potassium supplements Rifampin Salt substitutes Some diuretics St. John's Wort This list may not describe all possible interactions. Give your health care provider a list of all the medicines, herbs, non-prescription drugs, or dietary supplements you use. Also tell them if you smoke, drink alcohol, or use illegal drugs. Some items may interact with your medicine. What should I watch for while using this medication? Visit your care team for regular checks on your progress. Check your blood pressure as directed. Ask your care team what your blood pressure should be and when you should contact them. Women should inform their care team if they wish to become pregnant or think they might  be pregnant. There is a potential for serious side effects to an unborn child. Talk to your care team or pharmacist for more information. You may get drowsy or dizzy. Do not drive, use machinery, or do anything that needs mental  alertness until you know how this medication affects you. Do not sit or stand up quickly, especially if you are an older patient. This reduces the risk of dizzy or fainting spells. Alcohol may interfere with the effect of this medication. Avoid alcoholic drinks. What side effects may I notice from receiving this medication? Side effects that you should report to your care team as soon as possible: Allergic reactions--skin rash, itching, hives, swelling of the face, lips, tongue, or throat Heart attack--pain or tightness in the chest, shoulders, arms, or jaw, nausea, shortness of breath, cold or clammy skin, feeling faint or lightheaded High potassium level--muscle weakness, fast or irregular heartbeat Kidney injury--decrease in the amount of urine, swelling of the ankles, hands, or feet Low blood pressure--dizziness, feeling faint or lightheaded, blurry vision Severe diarrhea Worsening chest pain (angina)--pain, pressure, or tightness in the chest, neck, back, or arms Side effects that usually do not require medical attention (report to your care team if they continue or are bothersome): Constipation Facial flushing, redness Fatigue Headache Nausea Stomach pain Swelling of the ankles, hands, or feet This list may not describe all possible side effects. Call your doctor for medical advice about side effects. You may report side effects to FDA at 1-800-FDA-1088. Where should I keep my medication? Keep out of the reach of children and pets. Store at room temperature between 15 and 30 degrees C (59 and 86 degrees F). Throw away any unused medication after the expiration date. NOTE: This sheet is a summary. It may not cover all possible information. If you have questions about this medicine, talk to your doctor, pharmacist, or health care provider.  2023 Elsevier/Gold Standard (2021-12-10 00:00:00)

## 2023-03-11 ENCOUNTER — Ambulatory Visit (INDEPENDENT_AMBULATORY_CARE_PROVIDER_SITE_OTHER): Payer: Medicare HMO | Admitting: Nurse Practitioner

## 2023-03-11 ENCOUNTER — Ambulatory Visit (INDEPENDENT_AMBULATORY_CARE_PROVIDER_SITE_OTHER): Payer: Medicare HMO

## 2023-03-11 ENCOUNTER — Encounter (INDEPENDENT_AMBULATORY_CARE_PROVIDER_SITE_OTHER): Payer: Self-pay | Admitting: Nurse Practitioner

## 2023-03-11 VITALS — BP 118/60 | HR 50 | Resp 16 | Wt 245.2 lb

## 2023-03-11 DIAGNOSIS — I6523 Occlusion and stenosis of bilateral carotid arteries: Secondary | ICD-10-CM

## 2023-03-11 DIAGNOSIS — E785 Hyperlipidemia, unspecified: Secondary | ICD-10-CM | POA: Diagnosis not present

## 2023-03-11 DIAGNOSIS — I10 Essential (primary) hypertension: Secondary | ICD-10-CM

## 2023-03-16 ENCOUNTER — Other Ambulatory Visit: Payer: Self-pay | Admitting: *Deleted

## 2023-03-23 ENCOUNTER — Telehealth: Payer: Self-pay | Admitting: Cardiovascular Disease

## 2023-03-23 NOTE — Telephone Encounter (Signed)
Pt c/o medication issue:  1. Name of Medication:   amLODipine-olmesartan (AZOR) 5-20 MG tablet    rosuvastatin (CRESTOR) 40 MG tablet    2. How are you currently taking this medication (dosage and times per day)?   Take 1 tablet by mouth daily.    Take 0.5 tablets (20 mg total) by mouth daily.    3. Are you having a reaction (difficulty breathing--STAT)? No  4. What is your medication issue? Pt states that since the above medication change he has been experiencing constipation, fatigue, dizziness and clumsiness. He would like a callback regarding this matter

## 2023-03-23 NOTE — Telephone Encounter (Signed)
Left a message for the pt to call back.  

## 2023-03-24 NOTE — Telephone Encounter (Signed)
Call transferred from operator.  Patient states since starting amlodipine-olmesartan he has had constipation, fatigue, dizziness, and clumsiness. He does not want to take this medication anymore. He is asking if he can stop taking this and go back on rosuvastatin .  Patient states he thought Crestor was prescribed for his blood pressure. Informed patient Crestor is prescribed for cholesterol management, and amlodipine-olmesartan is for BP management.  Patient does not have a list of BP readings. Looking in chart last BP reading on 03/11/23 was 118/60, HR 50. Patient states this was without BP medication. He states elevated BP reading at [facility] on 03/08/23 was due to him going from office to office prior to his appt and he was rushed and stressed.  Will forward to Dr. Mariah Milling to advise on stopping amlodipine-olmesartan.

## 2023-03-24 NOTE — Telephone Encounter (Signed)
Left message for patient to call back  

## 2023-03-24 NOTE — Telephone Encounter (Signed)
Patient is calling back and also wanting to know if he can go back to previous medication. Please advise

## 2023-03-28 NOTE — Telephone Encounter (Signed)
Spoke with patient and informed him of the recommendations by Dr. Mariah Milling as follows:  "Really needs to be monitoring blood pressure at home On recent clinic visit systolic pressure 190 up to 200 even on rechecks Would recommend he try to stay on the pill for now and check blood pressure 2 hours after taking pill in the morning Certainly there are other blood pressure medications that could be used if he is having side effects but we need to know where his blood pressure is running Thx TGollan"  Patient understood with read back

## 2023-04-03 ENCOUNTER — Encounter (INDEPENDENT_AMBULATORY_CARE_PROVIDER_SITE_OTHER): Payer: Self-pay | Admitting: Nurse Practitioner

## 2023-04-03 NOTE — Progress Notes (Signed)
Subjective:    Patient ID: Jeremy Reese, male    DOB: 1951/03/19, 72 y.o.   MRN: [id number] Chief Complaint  Patient presents with   Follow-up    Ultrasound follow up    Jeremy Reese is a 72 year old male that returns for evaluation of his bilateral carotid arteries.  The patient had a carotid artery duplex on 11/11/2022 which was not completely completed due to notes of issues with pain from the patient.  The right side was evaluated but the left was not.  The right noted a ICA occlusion.  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  The left vertebral artery was noted as the dominant vessel.  The patient denies any TIA or amaurosis fugax-like symptoms.  His largest issues are with his back area with difficulty with ambulation.  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  His bilateral vertebral arteries have antegrade flow.  He has normal flow hemodynamics in the bilateral subclavian arteries however there are elevated velocities which suspect are compensatory due to the occlusions.    Review of Systems  Musculoskeletal:  Positive for gait problem.  All other systems reviewed and are negative.      Objective:   Physical Exam Vitals reviewed.  HENT:     Head: Normocephalic.  Neck:     Vascular: No carotid bruit.  Cardiovascular:     Rate and Rhythm: Normal rate.     Pulses:          Radial pulses are 1+ on the right side and 1+ on the left side.  Pulmonary:     Effort: Pulmonary effort is normal.  Skin:    General: Skin is warm and dry.  Neurological:     Mental Status: He is alert and oriented to person, place, and time.     Gait: Gait abnormal.  Psychiatric:        Mood and Affect: Mood normal.        Behavior: Behavior normal.        Thought Content: Thought content normal.        Judgment: Judgment normal.     BP  118/60 (BP Location: Left Arm)   Pulse (!) 50   Resp 16   Wt 245 lb 3.2 oz (111.2 kg)   BMI 34.20 kg/m   Past Medical History:  Diagnosis Date   Atrial flutter, paroxysmal (HCC)    Carotid artery disease without cerebral infarction [facility])    Coronary artery disease 2011   Flatback syndrome of thoracolumbar region    H/O calcium pyrophosphate deposition disease (CPPD)    History of TIA (transient ischemic attack)    Hyperlipidemia    Paroxysmal A-fib (HCC)    Spinal stenosis of lumbar region without neurogenic claudication     Social History   Socioeconomic History   Marital status: Married    Spouse name: Not on file   Number of children: Not on file   Years of education: Not on file   Highest education level: Not on file  Occupational History   Not on file  Tobacco Use   Smoking status: Former    Packs/day: 0.25    Years: 35.00    Additional pack years: 0.00    Total pack years: 8.75    Types: Cigarettes    Quit date: 11/29/1993    Years  since quitting: 29.3   Smokeless tobacco: Never  Vaping Use   Vaping Use: Never used  Substance and Sexual Activity   Alcohol use: Yes    Alcohol/week: 1.0 standard drink of alcohol    Types: 1 Cans of beer per week   Drug use: No   Sexual activity: Not Currently  Other Topics Concern   Not on file  Social History Narrative   Not on file   Social Determinants of Health   Financial Resource Strain: Not on file  Food Insecurity: Not on file  Transportation Needs: Not on file  Physical Activity: Not on file  Stress: Not on file  Social Connections: Not on file  Intimate Partner Violence: Not on file    Past Surgical History:  Procedure Laterality Date   BILATERAL CARPAL TUNNEL RELEASE     CARDIAC CATHETERIZATION     CIRCUMCISION, NON-NEWBORN     CLIPPING OF ATRIAL APPENDAGE N/A 09/18/2020   Procedure: CLIPPING OF ATRIAL APPENDAGE USING 45 ATRICURE LAA  EXCLUSION SYSTEM;  Surgeon: Kerin Perna, MD;  Location: [facility] OR;   Service: Open Heart Surgery;  Laterality: N/A;   CORONARY ANGIOPLASTY WITH STENT PLACEMENT  2011   CORONARY ARTERY BYPASS GRAFT N/A 09/18/2020   Procedure: CORONARY ARTERY BYPASS GRAFTING (CABG)X 3, ON PUMP, USING LEFT INTERAL MAMMARY ARTERY AND ENDOSCOPICALLY HARVESTED RIGHT GREATER SAPHENOUS VEIN. LIMA TO LAD, SVG TO OM, SVG TO PD;  Surgeon: Kerin Perna, MD;  Location: [facility] OR;  Service: Open Heart Surgery;  Laterality: N/A;   ENDOVEIN HARVEST OF GREATER SAPHENOUS VEIN Right 09/18/2020   Procedure: ENDOVEIN HARVEST OF GREATER SAPHENOUS VEIN;  Surgeon: Kerin Perna, MD;  Location: [facility] OR;  Service: Open Heart Surgery;  Laterality: Right;   LEFT HEART CATH AND CORONARY ANGIOGRAPHY Left 09/12/2020   Procedure: LEFT HEART CATH AND CORONARY ANGIOGRAPHY;  Surgeon: Antonieta Iba, MD;  Location: [facility] INVASIVE CV LAB;  Service: Cardiovascular;  Laterality: Left;   TEE WITHOUT CARDIOVERSION N/A 09/18/2020   Procedure: TRANSESOPHAGEAL ECHOCARDIOGRAM (TEE);  Surgeon: Donata Clay, Theron Arista, MD;  Location: [facility] OR;  Service: Open Heart Surgery;  Laterality: N/A;    Family History  Problem Relation Age of Onset   Heart disease Mother 10       CABG x 3    Hypertension Mother    Hypertension Father    Heart disease Father    Lung cancer Father    Kidney disease Other    Colon cancer Paternal Grandmother    Liver disease Neg Hx    Stomach cancer Neg Hx    Esophageal cancer Neg Hx    Pancreatic cancer Neg Hx     Allergies  Allergen Reactions   Propafenone Anxiety   Codeine Nausea Only   Penicillins Rash       Latest Ref Rng & Units 12/29/2020    8:28 AM 12/26/2020    3:17 PM 12/20/2020   11:42 AM  CBC  WBC 4.0 - 10.5 K/uL 6.2  6.5  7.0   Hemoglobin 13.0 - 17.0 g/dL 16.1  09.6  04.5   Hematocrit 39.0 - 52.0 % 42.1  37.2  41.6   Platelets 150 - 400 K/uL 155  152  146       CMP     Component Value Date/Time   NA 142 12/14/2020 0701   NA 140 09/05/2020 0950   K 3.7 12/14/2020 0701    CL 102 12/14/2020 0701   CO2 30 12/14/2020 0701  GLUCOSE 102 (H) 12/14/2020 0701   BUN 43 (H) 12/14/2020 0701   BUN 13 09/05/2020 0950   CREATININE 1.29 (H) 12/14/2020 0701   CALCIUM 9.4 12/14/2020 0701   PROT 6.7 12/14/2020 0701   ALBUMIN 3.8 12/14/2020 0701   AST 69 (H) 12/14/2020 0701   ALT 98 (H) 12/14/2020 0701   ALKPHOS 57 12/14/2020 0701   BILITOT 1.1 12/14/2020 0701   GFRNONAA >60 12/14/2020 0701   GFRAA 100 09/05/2020 0950     No results found.     Assessment & Plan:   1. Bilateral carotid artery stenosis Sunday confirmed occlusion of his bilateral internal carotid arteries.  Currently there is no role for revascularization.  We will continue to monitor the patient's state of his vertebral arteries.  It is noted that his left is more dominant.  Currently the patient on adequate medication regiment with statin, aspirin and Eliquis. Patient will return in 1 year. 2. Primary hypertension Continue antihypertensive medications as already ordered, these medications have been reviewed and there are no changes at this time.  3. Hyperlipidemia, unspecified hyperlipidemia type Continue statin as ordered and reviewed, no changes at this time   Current Outpatient Medications on File Prior to Visit  Medication Sig Dispense Refill   amLODipine-olmesartan (AZOR) 5-20 MG tablet Take 1 tablet by mouth daily. 30 tablet 6   apixaban (ELIQUIS) 5 MG TABS tablet Take 1 tablet (5 mg total) by mouth 2 (two) times daily. 60 tablet 0   aspirin EC 81 MG tablet Take 1 tablet (81 mg total) by mouth daily. 90 tablet 3   buPROPion (WELLBUTRIN XL) 300 MG 24 hr tablet Take 1 tablet (300 mg total) by mouth daily. 30 tablet 1   carvedilol (COREG) 25 MG tablet Take 1 tablet (25 mg total) by mouth 2 (two) times daily with a meal. 60 tablet 3   ezetimibe (ZETIA) 10 MG tablet TAKE ONE (1) TABLET EACH DAY 30 tablet 3   furosemide (LASIX) 20 MG tablet Take 1 tablet (20 mg total) by mouth daily as needed  (abdominal swelling). 90 tablet 3   gabapentin (NEURONTIN) 300 MG capsule Take 2 capsules (600 mg total) by mouth 3 (three) times daily. (Patient taking differently: Take 300 mg by mouth 3 (three) times daily.) 180 capsule 1   LORazepam (ATIVAN) 1 MG tablet Take 1 tablet (1 mg total) by mouth 2 (two) times daily as needed for anxiety or sleep. Script must last 30 days from fill date. 60 tablet 1   mometasone (ELOCON) 0.1 % cream Apply to affected skin once a day 5 days a week 45 g 3   nitroGLYCERIN (NITROSTAT) 0.4 MG SL tablet Place 1 tablet (0.4 mg total) under the tongue every 5 (five) minutes as needed for chest pain. 25 tablet 3   polyethylene glycol (MIRALAX / GLYCOLAX) 17 g packet Take 17 g by mouth daily.     potassium chloride (KLOR-CON) 10 MEQ tablet Take 1 tablet (10 mEq total) by mouth daily as needed (take when you take your fluid pill). 90 tablet 3   rosuvastatin (CRESTOR) 40 MG tablet Take 0.5 tablets (20 mg total) by mouth daily. 90 tablet 3   sildenafil (REVATIO) 20 MG tablet Take 1 to 5 tablets 1 hour prior to intercourse. 15 tablet 0   Sodium Sulfate-Mag Sulfate-KCl (SUTAB) [phone] MG TABS Use as directed for colonoscopy. MANUFACTURER CODES!! BIN: [id number] PCN: CN GROUP: [id number] MEMBER ID: [id number];[employer] AS SECONDARY INSURANCE ;NO PRIOR AUTHORIZATION 24 tablet 0  No current facility-administered medications on file prior to visit.    There are no Patient Instructions on file for this visit. No follow-ups on file.   Kris Hartmann, NP

## 2023-04-04 DIAGNOSIS — M19041 Primary osteoarthritis, right hand: Secondary | ICD-10-CM | POA: Diagnosis not present

## 2023-04-04 DIAGNOSIS — M47816 Spondylosis without myelopathy or radiculopathy, lumbar region: Secondary | ICD-10-CM | POA: Diagnosis not present

## 2023-04-04 DIAGNOSIS — M19042 Primary osteoarthritis, left hand: Secondary | ICD-10-CM | POA: Diagnosis not present

## 2023-04-04 DIAGNOSIS — L409 Psoriasis, unspecified: Secondary | ICD-10-CM | POA: Diagnosis not present

## 2023-04-28 ENCOUNTER — Ambulatory Visit: Payer: Medicare HMO | Admitting: Dermatology

## 2023-05-01 NOTE — Progress Notes (Unsigned)
Cardiology Office Note  Date:  05/02/2023   ID:  Jeremy Reese, Jeremy Reese 05/14/1951, MRN [id number]  PCP:  Jeremy Sell, MD   Chief Complaint  Patient presents with   6 month follow up     Patient c/o fatigue more than usual. Medications reviewed by the patient verbally.     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 Wyn Quaker,  atrial fibrillation dating back to 2011  severe back pain not a surgical candidate per the surgeons given his severe bilateral carotid disease Old CVA on CT scan head in 2015 09/18/2020.  He underwent CABG x 3  who presents for follow-up of his chronic atrial fibrillation, PAD, CAD with CABG for left main disease  Last seen by myself in clinic April 2024 Blood pressure was elevated Amlodipine olmesartan 5/20 mg daily was added Reported having constipation on the new medication We had recommend close monitoring of blood pressure  May be having side effects on Azor, takes in the Am Wakes 2 Am Fatigue in the Am after taking his morning medications  In the donut hole, Eliquis is expensive  No regular exercise program, limited by arthritides and back pain Watches TV, plays with the dog  Denies significant lower extremity edema  Labs reviewed A1C 6.5 Total chol 86 CR 1.7 BUN 27  EKG personally reviewed by myself on todays visit  shows atrial fibrillation with ventricular rate 71 bpm, no significant ST-T wave changes  Other past medical history reviewed Admitted to the hospital with suicidal ideation November 10, 2020 Inpatient stay in behavioral health Worsening depression  Past cardiac history reviewed cardiac catheterization October 2021 Images very concerning for critical ostial/proximal left circumflex disease estimated 95% or greater LAD with moderate to severe ostial/proximal disease though  difficult to visualize, and 60% mid disease  Cath 09/12/2020 Ost Cx to Prox Cx lesion is 95% stenosed. Ost LAD to Prox LAD lesion is 70% stenosed. Mid LAD lesion is 60% stenosed.  worsening anginal symptoms over the past several weeks with clear escalation of his isosorbide to relieve his pressure Critical ostial left circumflex disease of a large vessel (stent stenosis, heavily calcified), nondominant right, also with disease of the LAD Evaluated for CABG.   Echo: EF 60%, calcified aortic valve  operating room details 09/18/2020.  He underwent CABG x 3 utilizing LIMA to LAD, SVG to OM1, and SVG to PDA.  He had clipping of his LA appendage with a 45 mm Atricure Clip.  In the past he has tried higher dose pravastatin 80 mg and had side effects including fatigue, weakness, muscle ache. He is able to tolerate 40 mg daily.  Carotid ultrasound reviewed with him from 01/14/2015 showing bilateral internal carotid occlusion, no significant change from ultrasound dated June 2015   Prior studies show cardioversion 02/25/2015 Transesophageal echo 08/11/2010 with PFO noted, ejection fraction 50%, left atrium moderately dilated, moderate MR, mild to moderate TR    PMH:   has a past medical history of Atrial flutter, paroxysmal (HCC), Carotid artery disease without cerebral infarction [facility]), Coronary artery disease (2011), Flatback syndrome of thoracolumbar region, H/O calcium pyrophosphate deposition disease (CPPD), History of TIA (transient ischemic attack), Hyperlipidemia, Paroxysmal A-fib (HCC), and Spinal stenosis of lumbar region without neurogenic claudication.  PSH:    Past Surgical History:  Procedure Laterality Date   BILATERAL CARPAL  TUNNEL RELEASE     CARDIAC CATHETERIZATION     CIRCUMCISION, NON-NEWBORN     CLIPPING OF ATRIAL APPENDAGE N/A 09/18/2020   Procedure: CLIPPING OF ATRIAL APPENDAGE USING 45 ATRICURE LAA  EXCLUSION SYSTEM;  Surgeon: Kerin Perna, MD;  Location[facility] OR;   Service: Open Heart Surgery;  Laterality: N/A;   CORONARY ANGIOPLASTY WITH STENT PLACEMENT  2011   CORONARY ARTERY BYPASS GRAFT N/A 09/18/2020   Procedure: CORONARY ARTERY BYPASS GRAFTING (CABG)X 3, ON PUMP, USING LEFT INTERAL MAMMARY ARTERY AND ENDOSCOPICALLY HARVESTED RIGHT GREATER SAPHENOUS VEIN. LIMA TO LAD, SVG TO OM, SVG TO PD;  Surgeon: Kerin Perna, MD;  Location: [facility] OR;  Service: Open Heart Surgery;  Laterality: N/A;   ENDOVEIN HARVEST OF GREATER SAPHENOUS VEIN Right 09/18/2020   Procedure: ENDOVEIN HARVEST OF GREATER SAPHENOUS VEIN;  Surgeon: Kerin Perna, MD;  Location: [facility] OR;  Service: Open Heart Surgery;  Laterality: Right;   LEFT HEART CATH AND CORONARY ANGIOGRAPHY Left 09/12/2020   Procedure: LEFT HEART CATH AND CORONARY ANGIOGRAPHY;  Surgeon: Antonieta Iba, MD;  Location: [facility] INVASIVE CV LAB;  Service: Cardiovascular;  Laterality: Left;   TEE WITHOUT CARDIOVERSION N/A 09/18/2020   Procedure: TRANSESOPHAGEAL ECHOCARDIOGRAM (TEE);  Surgeon: Donata Clay, Theron Arista, MD;  Location: [facility] OR;  Service: Open Heart Surgery;  Laterality: N/A;    Current Outpatient Medications  Medication Sig Dispense Refill   amLODipine-olmesartan (AZOR) 5-20 MG tablet Take 1 tablet by mouth daily. 30 tablet 6   apixaban (ELIQUIS) 5 MG TABS tablet Take 1 tablet (5 mg total) by mouth 2 (two) times daily. 60 tablet 0   aspirin EC 81 MG tablet Take 1 tablet (81 mg total) by mouth daily. 90 tablet 3   buPROPion (WELLBUTRIN XL) 300 MG 24 hr tablet Take 1 tablet (300 mg total) by mouth daily. 30 tablet 1   carvedilol (COREG) 25 MG tablet Take 1 tablet (25 mg total) by mouth 2 (two) times daily with a meal. 60 tablet 3   dabigatran (PRADAXA) 150 MG CAPS capsule Take 1 capsule (150 mg total) by mouth 2 (two) times daily. 60 capsule 3   ezetimibe (ZETIA) 10 MG tablet TAKE ONE (1) TABLET EACH DAY 30 tablet 3   furosemide (LASIX) 20 MG tablet Take 1 tablet (20 mg total) by mouth daily as needed (abdominal  swelling). 90 tablet 3   gabapentin (NEURONTIN) 300 MG capsule Take 2 capsules (600 mg total) by mouth 3 (three) times daily. (Patient taking differently: Take 300 mg by mouth 3 (three) times daily.) 180 capsule 1   LORazepam (ATIVAN) 1 MG tablet Take 1 tablet (1 mg total) by mouth 2 (two) times daily as needed for anxiety or sleep. Script must last 30 days from fill date. 60 tablet 1   mometasone (ELOCON) 0.1 % cream Apply to affected skin once a day 5 days a week 45 g 3   nitroGLYCERIN (NITROSTAT) 0.4 MG SL tablet Place 1 tablet (0.4 mg total) under the tongue every 5 (five) minutes as needed for chest pain. 25 tablet 3   polyethylene glycol (MIRALAX / GLYCOLAX) 17 g packet Take 17 g by mouth daily.     potassium chloride (KLOR-CON) 10 MEQ tablet Take 1 tablet (10 mEq total) by mouth daily as needed (take when you take your fluid pill). 90 tablet 3   rosuvastatin (CRESTOR) 40 MG tablet Take 0.5 tablets (20 mg total) by mouth daily. 90 tablet 3   sildenafil (REVATIO) 20 MG  tablet Take 1 to 5 tablets 1 hour prior to intercourse. 15 tablet 0   Sodium Sulfate-Mag Sulfate-KCl (SUTAB) 320 398 0169 MG TABS Use as directed for colonoscopy. MANUFACTURER CODES!! BIN: [id number] PCN: CN GROUP: [id number] MEMBER ID: [id number];VHQ AS SECONDARY INSURANCE ;NO PRIOR AUTHORIZATION 24 tablet 0   No current facility-administered medications for this visit.     Allergies:   Propafenone, Codeine, and Penicillins   Social History:  The patient  reports that he quit smoking about 29 years ago. His smoking use included cigarettes. He has a 8.75 pack-year smoking history. He has never used smokeless tobacco. He reports current alcohol use of about 1.0 standard drink of alcohol per week. He reports that he does not use drugs.   Family History:   family history includes Colon cancer in his paternal grandmother; Heart disease in his father; Heart disease (age of onset: 56) in his mother; Hypertension in his father and mother;  Kidney disease in an other family member; Lung cancer in his father.    Review of Systems: Review of Systems  Constitutional: Negative.   HENT: Negative.    Respiratory: Negative.    Cardiovascular: Negative.   Gastrointestinal: Negative.   Musculoskeletal: Negative.   Neurological: Negative.   All other systems reviewed and are negative.   PHYSICAL EXAM: VS:  BP (!) 140/80 (BP Location: Left Arm, Patient Position: Sitting, Cuff Size: Normal)   Pulse 71   Ht 5\' 11"  (1.803 m)   Wt 248 lb 2 oz (112.5 kg)   SpO2 98%   BMI 34.61 kg/m  , BMI Body mass index is 34.61 kg/m. Constitutional:  oriented to person, place, and time. No distress.  HENT:  Head: Grossly normal Eyes:  no discharge. No scleral icterus.  Neck: No JVD, no carotid bruits  Cardiovascular: Irregularly irregular, no murmurs appreciated Trace lower extremity edema Pulmonary/Chest: Clear to auscultation bilaterally, no wheezes or rails Abdominal: Soft.  no distension.  no tenderness.  Musculoskeletal: Normal range of motion Neurological:  normal muscle tone. Coordination normal. No atrophy Skin: Skin warm and dry Psychiatric: normal affect, pleasant   Recent Labs: No results found for requested labs within last 365 days.    Lipid Panel Lab Results  Component Value Date   CHOL 108 09/05/2020   HDL 44 09/05/2020   LDLCALC 48 09/05/2020   TRIG 76 09/05/2020      Wt Readings from Last 3 Encounters:  05/02/23 248 lb 2 oz (112.5 kg)  03/11/23 245 lb 3.2 oz (111.2 kg)  03/08/23 240 lb 3.2 oz (109 kg)     ASSESSMENT AND PLAN:  Acute diastolic CHF (congestive heart failure) (HCC) - Appears euvolemic on today's visit, taking Lasix as needed Weight relatively stable, minimal leg edema, likely exacerbated from sitting Recommend compression hose, continue Lasix  Bilateral  carotid artery stenosis  Occluded carotids bilaterally Followed by vein and vascular Cholesterol at goal  Coronary artery disease  involving native coronary artery of native heart without angina pectoris - CABG 2021 Non-smoker, nondiabetic, cholesterol at goal Recommend walking program as tolerated though is limited secondary to arthritides  Chronic atrial fibrillation (HCC)  On anticoagulation, rate well controlled Tolerating Eliquis.  Rate well controlled off beta-blocker Will try prescription of Pradaxa through GoodRx.com ($68) to see if this is cheaper than Eliquis as he is in the donut hole  Mixed hyperlipidemia - Plan: EKG 12-Lead  Crestor 40 mg daily with Zetia Cholesterol at goal  Morbid obesity Weight stable but elevated, deconditioned Recommend low  carbohydrate diet for weight loss Would likely qualify for [facility]  Essential hypertension Blood pressure is well controlled on today's visit. No changes made to the medications.   Total encounter time more than 30 minutes Greater than 50% was spent in counseling and coordination of care with the patient    Orders Placed This Encounter  Procedures   EKG 12-Lead     Signed, Dossie Arbour, M.D., Ph.D. 05/02/2023  [facility] [place], [place] [phone]

## 2023-05-02 ENCOUNTER — Encounter: Payer: Self-pay | Admitting: Cardiovascular Disease

## 2023-05-02 ENCOUNTER — Ambulatory Visit: Payer: Medicare HMO | Attending: Cardiovascular Disease | Admitting: Cardiovascular Disease

## 2023-05-02 VITALS — BP 140/80 | HR 71 | Ht 71.0 in | Wt 248.1 lb

## 2023-05-02 DIAGNOSIS — I5032 Chronic diastolic (congestive) heart failure: Secondary | ICD-10-CM

## 2023-05-02 DIAGNOSIS — I6523 Occlusion and stenosis of bilateral carotid arteries: Secondary | ICD-10-CM

## 2023-05-02 DIAGNOSIS — E785 Hyperlipidemia, unspecified: Secondary | ICD-10-CM

## 2023-05-02 DIAGNOSIS — E782 Mixed hyperlipidemia: Secondary | ICD-10-CM

## 2023-05-02 DIAGNOSIS — I2510 Atherosclerotic heart disease of native coronary artery without angina pectoris: Secondary | ICD-10-CM | POA: Diagnosis not present

## 2023-05-02 DIAGNOSIS — I739 Peripheral vascular disease, unspecified: Secondary | ICD-10-CM

## 2023-05-02 DIAGNOSIS — I482 Chronic atrial fibrillation, unspecified: Secondary | ICD-10-CM | POA: Diagnosis not present

## 2023-05-02 MED ORDER — DABIGATRAN ETEXILATE MESYLATE 150 MG PO CAPS: 150.0000 mg | ORAL_CAPSULE | Freq: Two times a day (BID) | ORAL | 3 refills | Status: DC

## 2023-05-02 NOTE — Patient Instructions (Addendum)
Medication Instructions:  Ask total care of price for pradaxa 150 mg twice a day  If the Pradaxa is affordable, please call us back so we can discontinue the Eliquis.   If you need a refill on your cardiac medications before your next appointment, please call your pharmacy.   Lab work: No new labs needed  Testing/Procedures: No new testing needed  Follow-Up: At [facility], you and your health needs are our priority.  As part of our continuing mission to provide you with exceptional heart care, we have created designated Provider Care Teams.  These Care Teams include your primary Cardiologist (physician) and Advanced Practice Providers (APPs -  Physician Assistants and Nurse Practitioners) who all work together to provide you with the care you need, when you need it.  You will need a follow up appointment in 6 months  Providers on your designated Care Team:   Nicolasa Ducking, NP Eula Listen, PA-C Cadence Fransico Michael, [place]  COVID-19 Vaccine Information can be found at: [email or web address] For questions related to vaccine distribution or appointments, please email [email or web address] or call [phone].

## 2023-08-08 DIAGNOSIS — Z0100 Encounter for examination of eyes and vision without abnormal findings: Secondary | ICD-10-CM | POA: Diagnosis not present

## 2023-09-02 ENCOUNTER — Other Ambulatory Visit: Payer: Self-pay | Admitting: Cardiovascular Disease

## 2023-09-05 ENCOUNTER — Telehealth: Payer: Self-pay | Admitting: Cardiovascular Disease

## 2023-09-05 NOTE — Telephone Encounter (Signed)
The patient stated that he would not qualify for the Eliquis assistance and that this has been discussed with Dr. Mariah Milling previously.   Message sent to the provider.

## 2023-09-05 NOTE — Telephone Encounter (Signed)
Pt c/o medication issue:  1. Name of Medication: apixaban (ELIQUIS) 5 MG TABS tablet   2. How are you currently taking this medication (dosage and times per day)?    3. Are you having a reaction (difficulty breathing--STAT)? no  4. What is your medication issue? Patient calling to see what other options are there. Unable to afford the medication. Please advise

## 2023-09-07 NOTE — Telephone Encounter (Signed)
Called patient and left a message for a call back

## 2023-09-07 NOTE — Telephone Encounter (Signed)
Patient is returning call. Requesting call back 

## 2023-09-08 NOTE — Telephone Encounter (Signed)
Called and spoke with patient. Patient states that he has never applied for Eliquis patient assistance. Patient states that he  would like to apply and requested that the form be mailed to his home. Form mailed per patient request.

## 2023-09-14 DIAGNOSIS — H9313 Tinnitus, bilateral: Secondary | ICD-10-CM | POA: Diagnosis not present

## 2023-09-14 DIAGNOSIS — H903 Sensorineural hearing loss, bilateral: Secondary | ICD-10-CM | POA: Diagnosis not present

## 2023-10-18 ENCOUNTER — Ambulatory Visit: Payer: Medicare HMO | Admitting: Dermatology

## 2023-10-18 DIAGNOSIS — R238 Other skin changes: Secondary | ICD-10-CM | POA: Diagnosis not present

## 2023-10-18 DIAGNOSIS — L821 Other seborrheic keratosis: Secondary | ICD-10-CM | POA: Diagnosis not present

## 2023-10-18 DIAGNOSIS — L570 Actinic keratosis: Secondary | ICD-10-CM

## 2023-10-18 DIAGNOSIS — L820 Inflamed seborrheic keratosis: Secondary | ICD-10-CM

## 2023-10-18 DIAGNOSIS — W908XXA Exposure to other nonionizing radiation, initial encounter: Secondary | ICD-10-CM

## 2023-10-18 DIAGNOSIS — I872 Venous insufficiency (chronic) (peripheral): Secondary | ICD-10-CM

## 2023-10-18 DIAGNOSIS — L817 Pigmented purpuric dermatosis: Secondary | ICD-10-CM | POA: Diagnosis not present

## 2023-10-18 DIAGNOSIS — L578 Other skin changes due to chronic exposure to nonionizing radiation: Secondary | ICD-10-CM | POA: Diagnosis not present

## 2023-10-18 DIAGNOSIS — Z79899 Other long term (current) drug therapy: Secondary | ICD-10-CM

## 2023-10-18 DIAGNOSIS — D692 Other nonthrombocytopenic purpura: Secondary | ICD-10-CM

## 2023-10-18 DIAGNOSIS — Z7189 Other specified counseling: Secondary | ICD-10-CM

## 2023-10-18 DIAGNOSIS — L409 Psoriasis, unspecified: Secondary | ICD-10-CM | POA: Diagnosis not present

## 2023-10-18 MED ORDER — CALCIPOTRIENE 0.005 % EX CREA: TOPICAL_CREAM | CUTANEOUS | 0 refills | Status: DC

## 2023-10-18 NOTE — Patient Instructions (Addendum)
STASIS DERMATITIS with Shamberg's purpura Exam: Erythematous, scaly patches involving the ankle and distal lower leg with associated lower leg edema.  Chronic and persistent condition with duration or expected duration over one year. Condition is symptomatic/ bothersome to patient. Not currently at goal.  Stasis in the legs causes chronic leg swelling, which may result in itchy or painful rashes, skin discoloration, skin texture changes, and sometimes ulceration.  Recommend daily graduated compression hose/stockings- easiest to put on first thing in morning, remove at bedtime.  Elevate legs as much as possible. Avoid salt/sodium rich foods.  Due to recent changes in healthcare laws, you may see results of your pathology and/or laboratory studies on MyChart before the doctors have had a chance to review them. We understand that in some cases there may be results that are confusing or concerning to you. Please understand that not all results are received at the same time and often the doctors may need to interpret multiple results in order to provide you with the best plan of care or course of treatment. Therefore, we ask that you please give [place] 2 business days to thoroughly review all your results before contacting the office for clarification. Should we see a critical lab result, you will be contacted sooner.   If You Need Anything After Your Visit  If you have any questions or concerns for your doctor, please call our main line at [phone] and press option 4 to reach your doctor's medical assistant. If no one answers, please leave a voicemail as directed and we will return your call as soon as possible. Messages left after 4 pm will be answered the following business day.   You may also send [place] a message via MyChart. We typically respond to MyChart messages within 1-2 business days.  For prescription refills, please ask your pharmacy to contact our office. Our fax number is [phone].  If  you have an urgent issue when the clinic is closed that cannot wait until the next business day, you can page your doctor at the number below.    Please note that while we do our best to be available for urgent issues outside of office hours, we are not available 24/7.   If you have an urgent issue and are unable to reach [place], you may choose to seek medical care at your doctor's office, retail clinic, urgent care center, or emergency room.  If you have a medical emergency, please immediately call 911 or go to the emergency department.  Pager Numbers  - Dr. Gwen Pounds: [phone]  - Dr. Roseanne Reno: [phone]  - Dr. Katrinka Blazing: [phone]   In the event of inclement weather, please call our main line at [phone] for an update on the status of any delays or closures.  Dermatology Medication Tips: Please keep the boxes that topical medications come in in order to help keep track of the instructions about where and how to use these. Pharmacies typically print the medication instructions only on the boxes and not directly on the medication tubes.   If your medication is too expensive, please contact our office at [phone] option 4 or send [place] a message through MyChart.   We are unable to tell what your co-pay for medications will be in advance as this is different depending on your insurance coverage. However, we may be able to find a substitute medication at lower cost or fill out paperwork to get insurance to cover a needed medication.   If a prior authorization is required  to get your medication covered by your insurance company, please allow [place] 1-2 business days to complete this process.  Drug prices often vary depending on where the prescription is filled and some pharmacies may offer cheaper prices.  The website www.goodrx.com contains coupons for medications through different pharmacies. The prices here do not account for what the cost may be with help from insurance (it may be  cheaper with your insurance), but the website can give you the price if you did not use any insurance.  - You can print the associated coupon and take it with your prescription to the pharmacy.  - You may also stop by our office during regular business hours and pick up a GoodRx coupon card.  - If you need your prescription sent electronically to a different pharmacy, notify our office through [facility] or by phone at [phone] option 4.     Si Usted Necesita Algo Despus de Su Visita  Tambin puede enviarnos un mensaje a travs de Clinical cytogeneticist. Por lo general respondemos a los mensajes de MyChart en el transcurso de 1 a 2 das hbiles.  Para renovar recetas, por favor pida a su farmacia que se ponga en contacto con nuestra oficina. Annie Sable de fax es [place] [phone].  Si tiene un asunto urgente cuando la clnica est cerrada y que no puede esperar hasta el siguiente da hbil, puede llamar/localizar a su doctor(a) al nmero que aparece a continuacin.   Por favor, tenga en cuenta que aunque hacemos todo lo posible para estar disponibles para asuntos urgentes fuera del horario de [place], no estamos disponibles las 24 horas del da, los 7 [address]  [address] de la [place].   Si tiene un problema urgente y no puede comunicarse con nosotros, puede optar por buscar atencin mdica  en el consultorio de su doctor(a), en una clnica privada, en un centro de atencin urgente o en una sala de emergencias.  Si tiene Engineer, drilling, por favor llame inmediatamente al 911 o vaya a la sala de emergencias.  Nmeros de bper  - Dr. Gwen Pounds: [phone]  - Dra. Roseanne Reno: [phone]  - Dr. Katrinka Blazing: [phone]   En caso de inclemencias del tiempo, por favor llame a Lacy Duverney principal al [phone] para una actualizacin sobre el [place] de cualquier retraso o cierre.  Consejos para la medicacin en dermatologa: Por favor, guarde las cajas en las que vienen los medicamentos de uso  tpico para ayudarle a seguir las instrucciones sobre dnde y cmo usarlos. Las farmacias generalmente imprimen las instrucciones del medicamento slo en las cajas y no directamente en los tubos del [place].   Si su medicamento es muy caro, por favor, pngase en contacto con Rolm Gala llamando al [phone] y presione la opcin 4 o envenos un mensaje a travs de Clinical cytogeneticist.   No podemos decirle cul ser su copago por los medicamentos por adelantado ya que esto es diferente dependiendo de la cobertura de su seguro. Sin embargo, es posible que podamos encontrar un medicamento sustituto a Audiological scientist un formulario para que el seguro cubra el medicamento que se considera necesario.   Si se requiere una autorizacin previa para que su compaa de seguros [place] su medicamento, por favor permtanos de 1 a 2 das hbiles para completar [address]  Los precios de los medicamentos varan con frecuencia dependiendo del Environmental consultant de dnde se surte la receta y alguna farmacias pueden ofrecer precios ms baratos.  El sitio web www.goodrx.com tiene cupones para medicamentos de  diferentes farmacias. Los precios aqu no tienen en cuenta lo que podra costar con la ayuda del seguro (puede ser ms barato con su seguro), pero el sitio web puede darle el precio si no utiliz Tourist information centre manager.  - Puede imprimir el cupn correspondiente y llevarlo con su receta a la farmacia.  - Tambin puede pasar por nuestra oficina durante el horario de atencin regular y Education officer, museum una tarjeta de cupones de GoodRx.  - Si necesita que su receta se enve electrnicamente a una farmacia diferente, informe a nuestra oficina a travs de MyChart de [place] o por telfono llamando al [phone] y presione la opcin 4.

## 2023-10-18 NOTE — Progress Notes (Signed)
Follow-Up Visit   Subjective  Jeremy Reese is a 72 y.o. male who presents for the following: Psoriasis, postauricular area, pt states psoriasis has been clear and he no longer needs to use Mometasone 0.1% cream. Pt concerned about a lesion behind the L ear that he would like checked today. Irregular dark discoloration on the lower legs, pt would like to discuss treatment.     The following portions of the chart were reviewed this encounter and updated as appropriate: medications, allergies, medical history  Review of Systems:  No other skin or systemic complaints except as noted in HPI or Assessment and Plan.  Objective  Well appearing patient in no apparent distress; mood and affect are within normal limits.   A focused examination was performed of the following areas: the face, neck, ears, arms, and legs.   Relevant exam findings are noted in the Assessment and Plan.  R ear x 1 Erythematous thin papules/macules with gritty scale.   R forearm x 1 Erythematous stuck-on, waxy papule or plaque    Assessment & Plan   Cartilage papule on the L post ear -  Benign-appearing.  Observation.  Call clinic for new or changing lesions.  Recommend daily use of broad spectrum spf 30+ sunscreen to sun-exposed areas.   PSORIASIS Exam: Postauricular clear today. 0% BSA.  Chronic condition with duration or expected duration over one year. Currently well-controlled.  Psoriasis is a chronic non-curable, but treatable genetic/hereditary disease that may have other systemic features affecting other organ systems such as joints (Psoriatic Arthritis). It is associated with an increased risk of inflammatory bowel disease, heart disease, non-alcoholic fatty liver disease, and depression.  Treatments include light and laser treatments; topical medications; and systemic medications including oral and injectables.  Treatment Plan: Start Calcipotriene cream to aa's QD 7d/wk PRN flares, and  Mometasone 0.05% cream to aa's QD 5d/wk PRN.   STASIS DERMATITIS with Shamberg's purpura Exam: Erythematous, scaly patches involving the ankle and distal lower leg with associated lower leg edema.  Chronic and persistent condition with duration or expected duration over one year. Condition is symptomatic/ bothersome to patient. Not currently at goal.  Stasis in the legs causes chronic leg swelling, which may result in itchy or painful rashes, skin discoloration, skin texture changes, and sometimes ulceration.  Recommend daily graduated compression hose/stockings- easiest to put on first thing in morning, remove at bedtime.  Elevate legs as much as possible. Avoid salt/sodium rich foods.  Treatment Plan: Recommend graduated compression stockings daily.  AK (actinic keratosis) R ear x 1  Actinic keratoses are precancerous spots that appear secondary to cumulative UV radiation exposure/sun exposure over time. They are chronic with expected duration over 1 year. A portion of actinic keratoses will progress to squamous cell carcinoma of the skin. It is not possible to reliably predict which spots will progress to skin cancer and so treatment is recommended to prevent development of skin cancer.  Recommend daily broad spectrum sunscreen SPF 30+ to sun-exposed areas, reapply every 2 hours as needed.  Recommend staying in the shade or wearing long sleeves, sun glasses (UVA+UVB protection) and wide brim hats (4-inch brim around the entire circumference of the hat). Call for new or changing lesions.   Destruction of lesion - R ear x 1 Complexity: simple   Destruction method: cryotherapy   Informed consent: discussed and consent obtained   Timeout:  patient name, date of birth, surgical site, and procedure verified Lesion destroyed using liquid nitrogen: Yes  Region frozen until ice ball extended beyond lesion: Yes   Outcome: patient tolerated procedure well with no complications   Post-procedure  details: wound care instructions given    Inflamed seborrheic keratosis R forearm x 1  Symptomatic, irritating, patient would like treated.   Destruction of lesion - R forearm x 1 Complexity: simple   Destruction method: cryotherapy   Informed consent: discussed and consent obtained   Timeout:  patient name, date of birth, surgical site, and procedure verified Lesion destroyed using liquid nitrogen: Yes   Region frozen until ice ball extended beyond lesion: Yes   Outcome: patient tolerated procedure well with no complications   Post-procedure details: wound care instructions given    SEBORRHEIC KERATOSIS - Stuck-on, waxy, tan-brown papules and/or plaques  - Benign-appearing - Discussed benign etiology and prognosis. - Observe - Call for any changes  Purpura - Chronic; persistent and recurrent.  Treatable, but not curable. - Violaceous macules and patches - Benign - Related to trauma, age, sun damage and/or use of blood thinners, chronic use of topical and/or oral steroids - Observe - Can use OTC arnica containing moisturizer such as Dermend Bruise Formula if desired - Call for worsening or other concerns  Return in about 1 year (around 10/17/2024) for psoraisis and AK follow up.  Maylene Roes, CMA, am acting as scribe for Armida Sans, MD .   Documentation: I have reviewed the above documentation for accuracy and completeness, and I agree with the above.  Armida Sans, MD

## 2023-10-20 ENCOUNTER — Telehealth: Payer: Self-pay | Admitting: Cardiovascular Disease

## 2023-10-20 NOTE — Telephone Encounter (Signed)
Pt c/o medication issue:  1. Name of Medication:  rosuvastatin (CRESTOR) 40 MG tablet  2. How are you currently taking this medication (dosage and times per day)?  Patient states he takes 3 halves daily  3. Are you having a reaction (difficulty breathing--STAT)?   4. What is your medication issue?   Patient states this medication is causing extreme weakness and he can hardly put one foot in front of the other. He would like to know if he can be switched to a different medication. Please advise.

## 2023-10-20 NOTE — Telephone Encounter (Signed)
The patient called complaining of bilateral leg muscle pain and weakness, which he believes is related to rosuvastatin. He stated that he previously took pravastatin and experienced similar symptoms. The patient is inquiring if the MD recommends an alternative medication and mentioned that he would be willing to try an injectable medication if needed  Will forward to MD for recommendations

## 2023-10-21 NOTE — Telephone Encounter (Signed)
Called and spoke with patient. Informed patient of the following from Dr. Mariah Milling.  Several options for treatment of cholesterol He could try half dose of Crestor 20 mg daily to see if that causes less side effects Alternatively we could start Repatha 140 subcu every 2 weeks Thx TGollan   Patient verbalizes understanding. Patient states that he would like to try decreasing the Crestor in half and see if his symptoms improve.

## 2023-10-23 ENCOUNTER — Encounter: Payer: Self-pay | Admitting: Dermatology

## 2023-10-31 ENCOUNTER — Other Ambulatory Visit: Payer: Self-pay | Admitting: Cardiovascular Disease

## 2023-11-01 ENCOUNTER — Ambulatory Visit: Payer: Medicare HMO | Admitting: Cardiovascular Disease

## 2023-11-09 ENCOUNTER — Ambulatory Visit: Payer: Medicare HMO | Admitting: Medical

## 2023-11-09 NOTE — Progress Notes (Unsigned)
Cardiology Office Note:    Date:  11/09/2023   ID:  BUKHARI SERVEDIO, DOB 07-09-1951, MRN [id number]  PCP:  Mick Sell, MD  [facility] Cardiologist:  Julien Nordmann, MD  [facility] Electrophysiologist:  None   Referring MD: Mick Sell, MD   Chief Complaint: ***  History of Present Illness:    Jeremy Reese is a 72 y.o. male with a hx of CAD s/p remote PCI and CABGx3 in 2021 with left atrial appendage clipping, TIA 2012, bilateral occlusion of ICA monitored by Dr. Wyn Quaker, permanent Afib, HLD, chronic back pain who presents for 1-2 month follow-up.    Cardiac cath in 2010 with PCI to the left circumflex. Per previous documentation cardiac cath at that time with 95% proximal circumflex, 60% mid LAD.  Per previous documentation transesophageal echo on 08/11/2010 with PFO, EF 50%, left atrium moderately dilated, moderate MR, mild to moderate TR.  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  He had previous cardioversion 01/2015.   Patient was seen 09/05/2020 in the office reporting worsening angina for the last month.  Patient was set up for cardiac catheterization.  Cardiac cath 09/12/2020 showed 95% stenosis ostial circumflex to proximal circumflex, 70% stenosis ostial LAD to proximal LAD, 60% mid LAD stenosis.  Patient ultimately underwent CABG x 3 and left atrial appendage clipping.  Carotid ultrasound in April 2024 showed total occlusion of the right ICA, left carotid with total occlusion of the left ICA, antegrade flow of vertebrals, normal flow and subclavian arteries.   The patient was last seen in June 2024 and blood pressure was elevated so amlodipine and olmesartan were added.  Patient had swelling on exam, compression hose was recommended.  Today,   Past Medical History:  Diagnosis Date   Atrial flutter,  paroxysmal (HCC)    Carotid artery disease without cerebral infarction [facility])    Coronary artery disease 2011   Flatback syndrome of thoracolumbar region    H/O calcium pyrophosphate deposition disease (CPPD)    History of TIA (transient ischemic attack)    Hyperlipidemia    Paroxysmal A-fib (HCC)    Spinal stenosis of lumbar region without neurogenic claudication     Past Surgical History:  Procedure Laterality Date   BILATERAL CARPAL TUNNEL RELEASE     CARDIAC CATHETERIZATION     CIRCUMCISION, NON-NEWBORN     CLIPPING OF ATRIAL APPENDAGE N/A 09/18/2020   Procedure: CLIPPING OF ATRIAL APPENDAGE USING 45 ATRICURE LAA  EXCLUSION SYSTEM;  Surgeon: Kerin Perna, MD;  Location: [facility] OR;  Service: Open Heart Surgery;  Laterality: N/A;   CORONARY ANGIOPLASTY WITH STENT PLACEMENT  2011   CORONARY ARTERY BYPASS GRAFT N/A 09/18/2020   Procedure: CORONARY ARTERY BYPASS GRAFTING (CABG)X 3, ON PUMP, USING LEFT INTERAL MAMMARY ARTERY AND ENDOSCOPICALLY HARVESTED RIGHT GREATER SAPHENOUS VEIN. LIMA TO LAD, SVG TO OM, SVG TO PD;  Surgeon: Kerin Perna, MD;  Location: [facility] OR;  Service: Open Heart Surgery;  Laterality: N/A;   ENDOVEIN HARVEST OF GREATER SAPHENOUS VEIN Right 09/18/2020   Procedure: ENDOVEIN HARVEST OF GREATER SAPHENOUS VEIN;  Surgeon: Kerin Perna, MD;  Location: [facility] OR;  Service: Open Heart Surgery;  Laterality: Right;   LEFT HEART CATH AND CORONARY ANGIOGRAPHY Left 09/12/2020   Procedure: LEFT HEART CATH AND CORONARY ANGIOGRAPHY;  Surgeon: Antonieta Iba, MD;  Location: [facility] INVASIVE  CV LAB;  Service: Cardiovascular;  Laterality: Left;   TEE WITHOUT CARDIOVERSION N/A 09/18/2020   Procedure: TRANSESOPHAGEAL ECHOCARDIOGRAM (TEE);  Surgeon: Donata Clay, Theron Arista, MD;  Location: [facility] OR;  Service: Open Heart Surgery;  Laterality: N/A;    Current Medications: No outpatient medications have been marked as taking for the 11/09/23 encounter (Appointment) with Fransico Michael, Noheli Melder H, PA-C.      Allergies:   Propafenone, Codeine, and Penicillins   Social History   Socioeconomic History   Marital status: Married    Spouse name: Not on file   Number of children: Not on file   Years of education: Not on file   Highest education level: Not on file  Occupational History   Not on file  Tobacco Use   Smoking status: Former    Current packs/day: 0.00    Average packs/day: 0.3 packs/day for 35.0 years (8.8 ttl pk-yrs)    Types: Cigarettes    Start date: 11/29/1958    Quit date: 11/29/1993    Years since quitting: 29.9   Smokeless tobacco: Never  Vaping Use   Vaping status: Never Used  Substance and Sexual Activity   Alcohol use: Yes    Alcohol/week: 1.0 standard drink of alcohol    Types: 1 Cans of beer per week   Drug use: No   Sexual activity: Not Currently  Other Topics Concern   Not on file  Social History Narrative   Not on file   Social Determinants of Health   Financial Resource Strain: Low Risk  (02/15/2023)   Received from [facility], [facility]   Overall Financial Resource Strain (CARDIA)    Difficulty of Paying Living Expenses: Not hard at all  Food Insecurity: No Food Insecurity (02/15/2023)   Received from [facility], [facility]   Hunger Vital Sign    Worried About Running Out of Food in the Last Year: Never true    Ran Out of Food in the Last Year: Never true  Transportation Needs: No Transportation Needs (02/15/2023)   Received from [facility], [facility]   [facility] - Transportation    In the past 12 months, has lack of transportation kept you from medical appointments or from getting medications?: No    Lack of Transportation (Non-Medical): No  Physical Activity: Not on file  Stress: Not on file  Social Connections: Not on file     Family History: The patient's ***family history includes Colon cancer in his paternal grandmother; Heart  disease in his father; Heart disease (age of onset: 79) in his mother; Hypertension in his father and mother; Kidney disease in an other family member; Lung cancer in his father. There is no history of Liver disease, Stomach cancer, Esophageal cancer, or Pancreatic cancer.  ROS:   Please see the history of present illness.    *** All other systems reviewed and are negative.  EKGs/Labs/Other Studies Reviewed:    The following studies were reviewed today: ***  EKG:  EKG is *** ordered today.  The ekg ordered today demonstrates ***  Recent Labs: No results found for requested labs within last 365 days.  Recent Lipid Panel    Component Value Date/Time   CHOL 108 09/05/2020 0950   TRIG 76 09/05/2020 0950   HDL 44 09/05/2020 0950   CHOLHDL 2.5 09/05/2020 0950   LDLCALC 48 09/05/2020 0950     Risk Assessment/Calculations:   {Does this patient have ATRIAL  FIBRILLATION?[phone]}   Physical Exam:    VS:  There were no vitals taken for this visit.    Wt Readings from Last 3 Encounters:  05/02/23 248 lb 2 oz (112.5 kg)  03/11/23 245 lb 3.2 oz (111.2 kg)  03/08/23 240 lb 3.2 oz (109 kg)     GEN: *** Well nourished, well developed in no acute distress HEENT: Normal NECK: No JVD; No carotid bruits LYMPHATICS: No lymphadenopathy CARDIAC: ***RRR, no murmurs, rubs, gallops RESPIRATORY:  Clear to auscultation without rales, wheezing or rhonchi  ABDOMEN: Soft, non-tender, non-distended MUSCULOSKELETAL:  No edema; No deformity  SKIN: Warm and dry NEUROLOGIC:  Alert and oriented x 3 PSYCHIATRIC:  Normal affect   ASSESSMENT:    No diagnosis found. PLAN:    In order of problems listed above:  ***  Disposition: Follow up {follow up:15908} with ***   Shared Decision Making/Informed Consent   {Are you ordering a CV Procedure (e.g. stress test, cath, DCCV, TEE, etc)?   Press F2        :[id number]}    Signed, Ellery Meroney David Stall, PA-C  11/09/2023 1:03 PM    [facility]

## 2023-12-07 NOTE — Progress Notes (Deleted)
 Cardiology Office Note:    Date:  12/07/2023   ID:  DEMONTRAE GILBERT, DOB Dec 16, 1950, MRN [id number]  PCP:  Epifanio Alm SQUIBB, MD  [facility] Cardiologist:  Evalene Lunger, MD  [facility] Electrophysiologist:  None   Referring MD: Epifanio Alm SQUIBB, MD   Chief Complaint: ***  History of Present Illness:    Jeremy Reese is a 73 y.o. male with a hx of CAD s/p remote PCI and CABGx3 in 2021 with left atrial appendage clipping, TIA 2012, bilateral occlusion of ICA monitored by Dr. Marea, permanent Afib, HLD, chronic back pain who presents for 1-2 month follow-up.    Cardiac cath in 2010 with PCI to the left circumflex. Per previous documentation cardiac cath at that time with 95% proximal circumflex, 60% mid LAD.  Per previous documentation transesophageal echo on 08/11/2010 with PFO, EF 50%, left atrium moderately dilated, moderate MR, mild to moderate TR.  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  He had previous cardioversion 01/2015.   Patient was seen 09/05/2020 in the office reporting worsening angina for the last month.  Patient was set up for cardiac catheterization.  Cardiac cath 09/12/2020 showed 95% stenosis ostial circumflex to proximal circumflex, 70% stenosis ostial LAD to proximal LAD, 60% mid LAD stenosis.  Patient ultimately underwent CABG x 3 and left atrial appendage clipping.  Patient was last seen in June 2024 and was overall stable from a cardiac perspective.  Pradaxa  was tried in place of Eliquis  as he was in the donut hole.  Today,  Past Medical History:  Diagnosis Date   Atrial flutter, paroxysmal (HCC)    Carotid artery disease without cerebral infarction [facility])    Coronary artery disease 2011   Flatback syndrome of thoracolumbar region    H/O calcium  pyrophosphate deposition disease (CPPD)     History of TIA (transient ischemic attack)    Hyperlipidemia    Paroxysmal A-fib (HCC)    Spinal stenosis of lumbar region without neurogenic claudication     Past Surgical History:  Procedure Laterality Date   BILATERAL CARPAL TUNNEL RELEASE     CARDIAC CATHETERIZATION     CIRCUMCISION, NON-NEWBORN     CLIPPING OF ATRIAL APPENDAGE N/A 09/18/2020   Procedure: CLIPPING OF ATRIAL APPENDAGE USING 45 ATRICURE LAA  EXCLUSION SYSTEM;  Surgeon: Fleeta Hanford Coy, MD;  Location: [facility] OR;  Service: Open Heart Surgery;  Laterality: N/A;   CORONARY ANGIOPLASTY WITH STENT PLACEMENT  2011   CORONARY ARTERY BYPASS GRAFT N/A 09/18/2020   Procedure: CORONARY ARTERY BYPASS GRAFTING (CABG)X 3, ON PUMP, USING LEFT INTERAL MAMMARY ARTERY AND ENDOSCOPICALLY HARVESTED RIGHT GREATER SAPHENOUS VEIN. LIMA TO LAD, SVG TO OM, SVG TO PD;  Surgeon: Fleeta Hanford Coy, MD;  Location[facility] OR;  Service: Open Heart Surgery;  Laterality: N/A;   ENDOVEIN HARVEST OF GREATER SAPHENOUS VEIN Right 09/18/2020   Procedure: ENDOVEIN HARVEST OF GREATER SAPHENOUS VEIN;  Surgeon: Fleeta Hanford Coy, MD;  Location: [facility] OR;  Service: Open Heart Surgery;  Laterality: Right;   LEFT HEART CATH AND CORONARY ANGIOGRAPHY Left 09/12/2020   Procedure: LEFT HEART CATH AND CORONARY ANGIOGRAPHY;  Surgeon: Lunger Evalene PARAS, MD;  Location: [facility] INVASIVE CV LAB;  Service: Cardiovascular;  Laterality: Left;   TEE WITHOUT CARDIOVERSION N/A 09/18/2020   Procedure: TRANSESOPHAGEAL ECHOCARDIOGRAM (TEE);  Surgeon: Fleeta Hanford, Coy, MD;  Location: [facility] OR;  Service: Open Heart Surgery;  Laterality: N/A;    Current Medications: No outpatient medications have been marked as taking for the 12/08/23 encounter (Appointment) with [place], Gemini Beaumier H, PA-C.     Allergies:   Propafenone, Codeine, and Penicillins   Social History   Socioeconomic History   Marital status: Married    Spouse name: Not on file   Number of children: Not on file   Years of education: Not on  file   Highest education level: Not on file  Occupational History   Not on file  Tobacco Use   Smoking status: Former    Current packs/day: 0.00    Average packs/day: 0.3 packs/day for 35.0 years (8.8 ttl pk-yrs)    Types: Cigarettes    Start date: 11/29/1958    Quit date: 11/29/1993    Years since quitting: 30.0   Smokeless tobacco: Never  Vaping Use   Vaping status: Never Used  Substance and Sexual Activity   Alcohol use: Yes    Alcohol/week: 1.0 standard drink of alcohol    Types: 1 Cans of beer per week   Drug use: No   Sexual activity: Not Currently  Other Topics Concern   Not on file  Social History Narrative   Not on file   Social Drivers of Health   Financial Resource Strain: Low Risk  (02/15/2023)   Received [facility], [facility]   Overall Financial Resource Strain (CARDIA)    Difficulty of Paying Living Expenses: Not hard at all  Food Insecurity: No Food Insecurity (02/15/2023)   Received from [facility], [facility]   Hunger Vital Sign    Worried About Running Out of Food in the Last Year: Never true    Ran Out of Food in the Last Year: Never true  Transportation Needs: No Transportation Needs (02/15/2023)   Received from [facility], [facility]   [facility] - Transportation    In the past 12 months, has lack of transportation kept you from medical appointments or from getting medications?: No    Lack of Transportation (Non-Medical): No  Physical Activity: Not on file  Stress: Not on file  Social Connections: Not on file     Family History: The patient's ***family history includes Colon cancer in his paternal grandmother; Heart disease in his father; Heart disease (age of onset: 66) in his mother; Hypertension in his father and mother; Kidney disease in an other family member; Lung cancer in his father. There is no history of Liver disease, Stomach  cancer, Esophageal cancer, or Pancreatic cancer.  ROS:   Please see the history of present illness.    *** All other systems reviewed and are negative.  EKGs/Labs/Other Studies Reviewed:    The following studies were reviewed today: ***  EKG:  EKG is *** ordered today.  The ekg ordered today demonstrates ***  Recent Labs: No results found for requested labs within last 365 days.  Recent Lipid Panel    Component Value Date/Time   CHOL 108 09/05/2020 0950   TRIG 76 09/05/2020 0950   HDL 44 09/05/2020 0950   CHOLHDL 2.5 09/05/2020 0950   LDLCALC 48 09/05/2020 0950     Risk Assessment/Calculations:   {Does this patient have ATRIAL FIBRILLATION?:[phone]}   Physical Exam:    VS:  There were no vitals taken for this visit.    Wt Readings from Last 3 Encounters:  05/02/23 248 lb 2  oz (112.5 kg)  03/11/23 245 lb 3.2 oz (111.2 kg)  03/08/23 240 lb 3.2 oz (109 kg)     GEN: *** Well nourished, well developed in no acute distress HEENT: Normal NECK: No JVD; No carotid bruits LYMPHATICS: No lymphadenopathy CARDIAC: ***RRR, no murmurs, rubs, gallops RESPIRATORY:  Clear to auscultation without rales, wheezing or rhonchi  ABDOMEN: Soft, non-tender, non-distended MUSCULOSKELETAL:  No edema; No deformity  SKIN: Warm and dry NEUROLOGIC:  Alert and oriented x 3 PSYCHIATRIC:  Normal affect   ASSESSMENT:    No diagnosis found. PLAN:    In order of problems listed above:  ***  Disposition: Follow up {follow up:15908} with ***   Shared Decision Making/Informed Consent   {Are you ordering a CV Procedure (e.g. stress test, cath, DCCV, TEE, etc)?   Press F2        :[id number]}    Signed, Majid Mccravy VEAR Fishman, PA-C  12/07/2023 8:54 AM    [facility]

## 2023-12-08 ENCOUNTER — Ambulatory Visit: Payer: Medicare HMO | Admitting: Medical

## 2023-12-27 ENCOUNTER — Ambulatory Visit: Payer: Medicare HMO | Attending: Medical | Admitting: Medical

## 2023-12-27 ENCOUNTER — Encounter: Payer: Self-pay | Admitting: Medical

## 2023-12-27 VITALS — BP 122/66 | HR 61 | Ht 71.0 in | Wt 247.6 lb

## 2023-12-27 DIAGNOSIS — I4821 Permanent atrial fibrillation: Secondary | ICD-10-CM | POA: Diagnosis not present

## 2023-12-27 DIAGNOSIS — I6523 Occlusion and stenosis of bilateral carotid arteries: Secondary | ICD-10-CM | POA: Diagnosis not present

## 2023-12-27 DIAGNOSIS — I2510 Atherosclerotic heart disease of native coronary artery without angina pectoris: Secondary | ICD-10-CM

## 2023-12-27 DIAGNOSIS — I5032 Chronic diastolic (congestive) heart failure: Secondary | ICD-10-CM | POA: Diagnosis not present

## 2023-12-27 DIAGNOSIS — R600 Localized edema: Secondary | ICD-10-CM | POA: Diagnosis not present

## 2023-12-27 MED ORDER — FUROSEMIDE 20 MG PO TABS: 20.0000 mg | ORAL_TABLET | ORAL | 3 refills | Status: DC

## 2023-12-27 NOTE — Progress Notes (Unsigned)
Cardiology Office Note:    Date:  12/27/2023   ID:  Jeremy, Reese [birth year], MRN [id number]  PCP:  Mick Sell, MD  [facility] Cardiologist:  Julien Nordmann, MD  [facility] Electrophysiologist:  None   Referring MD: Mick Sell, MD   Chief Complaint: 6 month follow-up  History of Present Illness:    Jeremy Reese is a 73 y.o. male with a hx of CAD s/p remote PCI and CABGx3 in 2021 with left atrial appendage clipping, TIA 2012, bilateral occlusion of ICA monitored by Dr. Wyn Quaker, permanent Afib, HLD, chronic back pain who presents for 1-2 month follow-up.    Cardiac cath in 2010 with PCI to the left circumflex. Per previous documentation cardiac cath at that time with 95% proximal circumflex, 60% mid LAD.  Per previous documentation transesophageal echo on 08/11/2010 with PFO, EF 50%, left atrium moderately dilated, moderate MR, mild to moderate TR.  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  He had previous cardioversion 01/2015.   Patient was seen 09/05/2020 in the office reporting worsening angina for the last month.  Patient was set up for cardiac catheterization.  Cardiac cath 09/12/2020 showed 95% stenosis ostial circumflex to proximal circumflex, 70% stenosis ostial LAD to proximal LAD, 60% mid LAD stenosis.  Patient ultimately underwent CABG x 3 and left atrial appendage clipping.  Patient was last seen in June 2024 and was stable from a cardiac perspective.    Today, the patient is in rate controlled afib. He has been ok since the last visit. He has chronic back pain. He uses a walker most of the time. He denies chest pain, shortness of breath, orthopnea, pnd, lightheadedness, dizziness, palpitations. He feels week a lot, which he thinks is from the gabapentin. He has lower leg edema on exam. He has  incontinence and does not take lasix.   Past Medical History:  Diagnosis Date   Atrial flutter, paroxysmal (HCC)    Carotid artery disease without cerebral infarction [facility])    Coronary artery disease 2011   Flatback syndrome of thoracolumbar region    H/O calcium pyrophosphate deposition disease (CPPD)    History of TIA (transient ischemic attack)    Hyperlipidemia    Paroxysmal A-fib (HCC)    Spinal stenosis of lumbar region without neurogenic claudication     Past Surgical History:  Procedure Laterality Date   BILATERAL CARPAL TUNNEL RELEASE     CARDIAC CATHETERIZATION     CIRCUMCISION, NON-NEWBORN     CLIPPING OF ATRIAL APPENDAGE N/A 09/18/2020   Procedure: CLIPPING OF ATRIAL APPENDAGE USING 45 ATRICURE LAA  EXCLUSION SYSTEM;  Surgeon: Kerin Perna, MD;  Location: [facility] OR;  Service: Open Heart Surgery;  Laterality: N/A;   CORONARY ANGIOPLASTY WITH STENT PLACEMENT  2011   CORONARY ARTERY BYPASS GRAFT N/A 09/18/2020   Procedure: CORONARY ARTERY BYPASS GRAFTING (CABG)X 3, ON PUMP, USING LEFT INTERAL MAMMARY ARTERY AND ENDOSCOPICALLY HARVESTED RIGHT GREATER SAPHENOUS VEIN. LIMA TO LAD, SVG TO OM, SVG TO PD;  Surgeon: Kerin Perna, MD;  Location: [facility] OR;  Service: Open Heart Surgery;  Laterality: N/A;   ENDOVEIN HARVEST OF GREATER SAPHENOUS VEIN Right 09/18/2020   Procedure: ENDOVEIN HARVEST OF GREATER SAPHENOUS VEIN;  Surgeon: Kerin Perna, MD;  [facility] OR;  Service: Open Heart Surgery;  Laterality: Right;   LEFT HEART CATH  AND CORONARY ANGIOGRAPHY Left 09/12/2020   Procedure: LEFT HEART CATH AND CORONARY ANGIOGRAPHY;  Surgeon: Antonieta Iba, MD;  Location: [facility] INVASIVE CV LAB;  Service: Cardiovascular;  Laterality: Left;   TEE WITHOUT CARDIOVERSION N/A 09/18/2020   Procedure: TRANSESOPHAGEAL ECHOCARDIOGRAM (TEE);  Surgeon: Donata Clay, Theron Arista, MD;  Location: [facility] OR;  Service: Open Heart Surgery;  Laterality: N/A;    Current Medications: Current Meds  Medication Sig    amLODipine-olmesartan (AZOR) 5-20 MG tablet TAKE 1 TABLET BY MOUTH ONCE DAILY   apixaban (ELIQUIS) 5 MG TABS tablet Take 1 tablet (5 mg total) by mouth 2 (two) times daily.   aspirin EC 81 MG tablet Take 1 tablet (81 mg total) by mouth daily.   buPROPion (WELLBUTRIN XL) 300 MG 24 hr tablet Take 1 tablet (300 mg total) by mouth daily.   calcipotriene (DOVONOX) 0.005 % cream Apply to aa's psoriasis QD PRN flares.   carvedilol (COREG) 25 MG tablet Take 1 tablet (25 mg total) by mouth 2 (two) times daily with a meal.   dabigatran (PRADAXA) 150 MG CAPS capsule Take 1 capsule (150 mg total) by mouth 2 (two) times daily.   ezetimibe (ZETIA) 10 MG tablet TAKE ONE (1) TABLET EACH DAY   gabapentin (NEURONTIN) 300 MG capsule Take 2 capsules (600 mg total) by mouth 3 (three) times daily. (Patient taking differently: Take 300 mg by mouth 3 (three) times daily.)   LORazepam (ATIVAN) 1 MG tablet Take 1 tablet (1 mg total) by mouth 2 (two) times daily as needed for anxiety or sleep. Script must last 30 days from fill date.   mometasone (ELOCON) 0.1 % cream Apply to affected skin once a day 5 days a week   nitroGLYCERIN (NITROSTAT) 0.4 MG SL tablet Place 1 tablet (0.4 mg total) under the tongue every 5 (five) minutes as needed for chest pain.   polyethylene glycol (MIRALAX / GLYCOLAX) 17 g packet Take 17 g by mouth daily.   potassium chloride (KLOR-CON) 10 MEQ tablet Take 1 tablet (10 mEq total) by mouth daily as needed (take when you take your fluid pill).   rosuvastatin (CRESTOR) 40 MG tablet Take 0.5 tablets (20 mg total) by mouth daily.   sildenafil (REVATIO) 20 MG tablet Take 1 to 5 tablets 1 hour prior to intercourse.   Sodium Sulfate-Mag Sulfate-KCl (SUTAB) [phone] MG TABS Use as directed for colonoscopy. MANUFACTURER CODES!! BIN: [id number] PCN: CN GROUP: [id number] MEMBER ID: [id number];[employer] AS SECONDARY INSURANCE ;NO PRIOR AUTHORIZATION   [DISCONTINUED] furosemide (LASIX) 20 MG tablet Take 1 tablet (20  mg total) by mouth daily as needed (abdominal swelling).     Allergies:   Propafenone, Codeine, and Penicillins   Social History   Socioeconomic History   Marital status: Married    Spouse name: Not on file   Number of children: Not on file   Years of education: Not on file   Highest education level: Not on file  Occupational History   Not on file  Tobacco Use   Smoking status: Former    Current packs/day: 0.00    Average packs/day: 0.3 packs/day for 35.0 years (8.8 ttl pk-yrs)    Types: Cigarettes    Start date: 11/29/1958    Quit date: 11/29/1993    Years since quitting: 30.0   Smokeless tobacco: Never  Vaping Use   Vaping status: Never Used  Substance and Sexual Activity   Alcohol use: Yes    Alcohol/week: 1.0 standard drink of alcohol    Types:  1 Cans of beer per week   Drug use: No   Sexual activity: Not Currently  Other Topics Concern   Not on file  Social History Narrative   Not on file   Social Drivers of Health   Financial Resource Strain: Low Risk  (02/15/2023)   Received from [facility], [facility]   Overall Financial Resource Strain (CARDIA)    Difficulty of Paying Living Expenses: Not hard at all  Food Insecurity: No Food Insecurity (02/15/2023)   Received from [facility], [facility]   Hunger Vital Sign    Worried About Running Out of Food in the Last Year: Never true    Ran Out of Food in the Last Year: Never true  Transportation Needs: No Transportation Needs (02/15/2023)   Received from [facility], [facility]   [facility] - Transportation    In the past 12 months, has lack of transportation kept you from medical appointments or from getting medications?: No    Lack of Transportation (Non-Medical): No  Physical Activity: Not on file  Stress: Not on file  Social Connections: Not on file     Family History: The patient's family history  includes Colon cancer in his paternal grandmother; Heart disease in his father; Heart disease (age of onset: 70) in his mother; Hypertension in his father and mother; Kidney disease in an other family member; Lung cancer in his father. There is no history of Liver disease, Stomach cancer, Esophageal cancer, or Pancreatic cancer.  ROS:   Please see the history of present illness.     All other systems reviewed and are negative.  EKGs/Labs/Other Studies Reviewed:    The following studies were reviewed today:  Echo 08/2020  1. Left ventricular ejection fraction, by estimation, is 60 to 65%. The  left ventricle has normal function. The left ventricle has no regional  wall motion abnormalities. There is moderate left ventricular hypertrophy.  Left ventricular diastolic  parameters are indeterminate.   2. Right ventricular systolic function is normal. The right ventricular  size is normal.   3. Left atrial size was severely dilated.   4. Right atrial size was mildly dilated.   5. The mitral valve is grossly normal. Mild mitral valve regurgitation.  No evidence of mitral stenosis. Moderate mitral annular calcification.   6. The aortic valve is abnormal. There is severe calcifcation of the  aortic valve. Aortic valve regurgitation is not visualized. Aortic valve  sclerosis/calcification is present, without any evidence of aortic  stenosis. Mean systolic gradient 9 mmHg.    Cardiac cath 08/2020 Ost Cx to Prox Cx lesion is 95% stenosed. Ost LAD to Prox LAD lesion is 70% stenosed. Mid LAD lesion is 60% stenosed.  EKG:  EKG is ordered today.  The ekg ordered today demonstrates Afib 61bpm, nonspecific T wave changes  Recent Labs: No results found for requested labs within last 365 days.  Recent Lipid Panel    Component Value Date/Time   CHOL 108 09/05/2020 0950   TRIG 76 09/05/2020 0950   HDL 44 09/05/2020 0950   CHOLHDL 2.5 09/05/2020 0950   LDLCALC 48 09/05/2020 0950    Physical  Exam:    VS:  BP 122/66 (BP Location: Left Arm, Patient Position: Sitting, Cuff Size: Normal)   Pulse 61   Ht 5\' 11"  (1.803 m)   Wt 247 lb 9.6 oz (112.3 kg)   SpO2 94%   BMI 34.53  kg/m     Wt Readings from Last 3 Encounters:  12/27/23 247 lb 9.6 oz (112.3 kg)  05/02/23 248 lb 2 oz (112.5 kg)  03/11/23 245 lb 3.2 oz (111.2 kg)     GEN:  Well nourished, well developed in no acute distress HEENT: Normal NECK: No JVD; No carotid bruits LYMPHATICS: No lymphadenopathy CARDIAC: RRR, no murmurs, rubs, gallops RESPIRATORY:  Clear to auscultation without rales, wheezing or rhonchi  ABDOMEN: Soft, non-tender, non-distended MUSCULOSKELETAL:  1-2+ lower leg edema; No deformity  SKIN: Warm and dry NEUROLOGIC:  Alert and oriented x 3 PSYCHIATRIC:  Normal affect   ASSESSMENT:    1. Chronic diastolic heart failure (HCC)   2. Lower leg edema   3. Permanent atrial fibrillation (HCC)   4. Bilateral carotid artery stenosis   5. Coronary artery disease involving native coronary artery of native heart without angina pectoris    PLAN:    In order of problems listed above:  Chronic diastolic heart failure/LLE Patient does not take lasix due to incontinence. He has 1-2+ lower leg edema on exam, which he is unsure if it's new or old. I recommended he take lasix 20mg  three times weekly.. Continue Coreg. I will check BMET and BNP. Last echo 2021 showed LVEF 60-65%, moderate LVH. I will update an echo.   Permanent Afib Unsure if he is on Eliquis or Pradaxa. He is also on ASA. We will call pharmacy to clarify.  B/l carotid artery stenosis This is followed by vascular surgery.  CAD s/p CABG in 2021 The patient denies chest pain. No further ischemic work-up. He is on both ASA and Eliquis. Continue ASA, Coreg, Zetia and Crestor.   Disposition: Follow up in 6 months with MD/APP    Signed, Dajon Lazar David Stall, PA-C  12/27/2023 4:27 PM    [facility]

## 2023-12-27 NOTE — Patient Instructions (Signed)
Medication Instructions:  Your physician recommends the following medication changes.  INCREASE: Lasix 20 mg to 3 times weekly (example; Tuesday, Thursday, Friday)   I will contact the pharmacy to verify if you are taking Eliquis or Pradaxa   *If you need a refill on your cardiac medications before your next appointment, please call your pharmacy*   Lab Work: Your provider would like for you to return to have the following labs drawn: (Lipid< CMP, CBC, TSH, BNP).   Please go to [facility] [address] [address], [place] [address] You do not need an appointment.  They are open from 8 am- 4:30 pm.  Lunch from 1:00 pm- 2:00 pm You will need to be fasting.   Testing/Procedures: No test ordered today    Follow-Up: At [facility], you and your health needs are our priority.  As part of our continuing mission to provide you with exceptional heart care, we have created designated Provider Care Teams.  These Care Teams include your primary Cardiologist (physician) and Advanced Practice Providers (APPs -  Physician Assistants and Nurse Practitioners) who all work together to provide you with the care you need, when you need it.  We recommend signing up for the patient portal called "MyChart".  Sign up information is provided on this After Visit Summary.  MyChart is used to connect with patients for Virtual Visits (Telemedicine).  Patients are able to view lab/test results, encounter notes, upcoming appointments, etc.  Non-urgent messages can be sent to your provider as well.   To learn more about what you can do with MyChart, go to [email or web address].    Your next appointment:   6 month(s)  Provider:   You may see Julien Nordmann, MD or one of the following Advanced Practice Providers on your designated Care Team:   Nicolasa Ducking, NP Eula Listen, PA-C Cadence Fransico Michael, PA-C Charlsie Quest, NP Carlos Levering, NP

## 2023-12-28 ENCOUNTER — Telehealth: Payer: Self-pay

## 2023-12-28 DIAGNOSIS — R600 Localized edema: Secondary | ICD-10-CM

## 2023-12-28 NOTE — Telephone Encounter (Signed)
Called the patient to verify whether he is taking Eliquis or Pradaxa. The patient confirmed he is only taking Eliquis. I will forward this to the PA to make aware and  update medication list. The patient also made aware that Cadence would like to order an ECHO for lower leg edema. The patient verbalized understanding. The order has been placed, and a message has been sent to scheduling to arrange the date and time.

## 2023-12-29 DIAGNOSIS — I2510 Atherosclerotic heart disease of native coronary artery without angina pectoris: Secondary | ICD-10-CM | POA: Diagnosis not present

## 2023-12-29 DIAGNOSIS — M4800 Spinal stenosis, site unspecified: Secondary | ICD-10-CM | POA: Diagnosis not present

## 2023-12-29 DIAGNOSIS — Z23 Encounter for immunization: Secondary | ICD-10-CM | POA: Diagnosis not present

## 2023-12-29 DIAGNOSIS — R7303 Prediabetes: Secondary | ICD-10-CM | POA: Diagnosis not present

## 2023-12-29 DIAGNOSIS — Z0000 Encounter for general adult medical examination without abnormal findings: Secondary | ICD-10-CM | POA: Diagnosis not present

## 2023-12-29 DIAGNOSIS — Z8673 Personal history of transient ischemic attack (TIA), and cerebral infarction without residual deficits: Secondary | ICD-10-CM | POA: Diagnosis not present

## 2023-12-29 DIAGNOSIS — E785 Hyperlipidemia, unspecified: Secondary | ICD-10-CM | POA: Diagnosis not present

## 2023-12-29 DIAGNOSIS — Z125 Encounter for screening for malignant neoplasm of prostate: Secondary | ICD-10-CM | POA: Diagnosis not present

## 2023-12-29 DIAGNOSIS — Z1331 Encounter for screening for depression: Secondary | ICD-10-CM | POA: Diagnosis not present

## 2023-12-29 DIAGNOSIS — F32A Depression, unspecified: Secondary | ICD-10-CM | POA: Diagnosis not present

## 2023-12-29 DIAGNOSIS — I4891 Unspecified atrial fibrillation: Secondary | ICD-10-CM | POA: Diagnosis not present

## 2023-12-29 DIAGNOSIS — N189 Chronic kidney disease, unspecified: Secondary | ICD-10-CM | POA: Diagnosis not present

## 2024-01-07 ENCOUNTER — Other Ambulatory Visit: Payer: Self-pay | Admitting: Cardiovascular Disease

## 2024-01-09 ENCOUNTER — Other Ambulatory Visit: Payer: Self-pay | Admitting: Cardiovascular Disease

## 2024-01-12 ENCOUNTER — Encounter: Payer: Self-pay | Admitting: Emergency Medicine

## 2024-01-12 ENCOUNTER — Other Ambulatory Visit: Payer: Self-pay

## 2024-01-12 ENCOUNTER — Ambulatory Visit: Payer: Medicare HMO

## 2024-01-12 ENCOUNTER — Emergency Department: Payer: Medicare HMO

## 2024-01-12 DIAGNOSIS — I4891 Unspecified atrial fibrillation: Secondary | ICD-10-CM | POA: Diagnosis not present

## 2024-01-12 DIAGNOSIS — Z7901 Long term (current) use of anticoagulants: Secondary | ICD-10-CM | POA: Diagnosis not present

## 2024-01-12 DIAGNOSIS — R531 Weakness: Secondary | ICD-10-CM | POA: Diagnosis not present

## 2024-01-12 DIAGNOSIS — R0602 Shortness of breath: Secondary | ICD-10-CM | POA: Insufficient documentation

## 2024-01-12 DIAGNOSIS — I10 Essential (primary) hypertension: Secondary | ICD-10-CM | POA: Diagnosis not present

## 2024-01-12 DIAGNOSIS — I517 Cardiomegaly: Secondary | ICD-10-CM | POA: Diagnosis not present

## 2024-01-12 DIAGNOSIS — W19XXXA Unspecified fall, initial encounter: Secondary | ICD-10-CM | POA: Insufficient documentation

## 2024-01-12 DIAGNOSIS — I2510 Atherosclerotic heart disease of native coronary artery without angina pectoris: Secondary | ICD-10-CM | POA: Diagnosis not present

## 2024-01-12 DIAGNOSIS — R0989 Other specified symptoms and signs involving the circulatory and respiratory systems: Secondary | ICD-10-CM | POA: Diagnosis not present

## 2024-01-12 DIAGNOSIS — S199XXA Unspecified injury of neck, initial encounter: Secondary | ICD-10-CM | POA: Diagnosis not present

## 2024-01-12 DIAGNOSIS — S0990XA Unspecified injury of head, initial encounter: Secondary | ICD-10-CM | POA: Diagnosis not present

## 2024-01-12 DIAGNOSIS — R55 Syncope and collapse: Secondary | ICD-10-CM | POA: Diagnosis not present

## 2024-01-12 LAB — BASIC METABOLIC PANEL
Anion gap: 11 (ref 5–15)
BUN: 25 mg/dL — ABNORMAL HIGH (ref 8–23)
CO2: 23 mmol/L (ref 22–32)
Calcium: 9.3 mg/dL (ref 8.9–10.3)
Chloride: 104 mmol/L (ref 98–111)
Creatinine, Ser: 1.58 mg/dL — ABNORMAL HIGH (ref 0.61–1.24)
GFR, Estimated: 46 mL/min — ABNORMAL LOW (ref >60)
Glucose, Bld: 115 mg/dL — ABNORMAL HIGH (ref 70–99)
Potassium: 4.5 mmol/L (ref 3.5–5.1)
Sodium: 138 mmol/L (ref 135–145)

## 2024-01-12 LAB — CBC
HCT: 37.8 % — ABNORMAL LOW (ref 39.0–52.0)
Hemoglobin: 11.9 g/dL — ABNORMAL LOW (ref 13.0–17.0)
MCH: 32.7 pg (ref 26.0–34.0)
MCHC: 31.5 g/dL (ref 30.0–36.0)
MCV: 103.8 fL — ABNORMAL HIGH (ref 80.0–100.0)
Platelets: 92 10*3/uL — ABNORMAL LOW (ref 150–400)
RBC: 3.64 MIL/uL — ABNORMAL LOW (ref 4.22–5.81)
RDW: 12.4 % (ref 11.5–15.5)
WBC: 6.1 10*3/uL (ref 4.0–10.5)
nRBC: 0 % (ref 0.0–0.2)

## 2024-01-12 LAB — PROTIME-INR
INR: 1.6 — ABNORMAL HIGH (ref 0.8–1.2)
Prothrombin Time: 19.5 s — ABNORMAL HIGH (ref 11.4–15.2)

## 2024-01-12 LAB — TROPONIN I (HIGH SENSITIVITY): Troponin I (High Sensitivity): 18 ng/L — ABNORMAL HIGH (ref <18)

## 2024-01-12 NOTE — ED Triage Notes (Signed)
Patient reports syncopal episode 30 minutes ago while walking to his room, fell on his knees denies pain to them. Is on eliquis but denies hitting his head during the fall. Reports feeling normal all day up until passing out. Denies chest pain, SHOB.

## 2024-01-13 ENCOUNTER — Emergency Department
Admission: EM | Admit: 2024-01-13 | Discharge: 2024-01-13 | Disposition: A | Discharge status: Home / Self Care | Payer: Medicare HMO | Attending: Emergency Medicine | Admitting: Emergency Medicine

## 2024-01-13 ENCOUNTER — Emergency Department: Payer: Medicare HMO

## 2024-01-13 DIAGNOSIS — R55 Syncope and collapse: Secondary | ICD-10-CM

## 2024-01-13 DIAGNOSIS — W19XXXA Unspecified fall, initial encounter: Secondary | ICD-10-CM

## 2024-01-13 DIAGNOSIS — S199XXA Unspecified injury of neck, initial encounter: Secondary | ICD-10-CM | POA: Diagnosis not present

## 2024-01-13 DIAGNOSIS — S0990XA Unspecified injury of head, initial encounter: Secondary | ICD-10-CM | POA: Diagnosis not present

## 2024-01-13 LAB — URINALYSIS, W/ REFLEX TO CULTURE (INFECTION SUSPECTED)
Bacteria, UA: NONE SEEN
Bilirubin Urine: NEGATIVE
Glucose, UA: NEGATIVE mg/dL
Ketones, ur: NEGATIVE mg/dL
Leukocytes,Ua: NEGATIVE
Nitrite: NEGATIVE
Protein, ur: >=300 mg/dL — AB
Specific Gravity, Urine: 1.014 (ref 1.005–1.030)
Squamous Epithelial / HPF: 0 /[HPF] (ref 0–5)
pH: 5 (ref 5.0–8.0)

## 2024-01-13 LAB — TROPONIN I (HIGH SENSITIVITY): Troponin I (High Sensitivity): 21 ng/L — ABNORMAL HIGH (ref <18)

## 2024-01-13 LAB — BRAIN NATRIURETIC PEPTIDE: B Natriuretic Peptide: 166.9 pg/mL — ABNORMAL HIGH (ref 0.0–100.0)

## 2024-01-13 MED ORDER — ACETAMINOPHEN 500 MG PO TABS
1000.0000 mg | ORAL_TABLET | Freq: Once | ORAL | Status: AC
  Administered 2024-01-13: 1000 mg via ORAL
  Filled 2024-01-13: qty 2

## 2024-01-13 MED ORDER — LACTATED RINGERS IV BOLUS
500.0000 mL | Freq: Once | INTRAVENOUS | Status: AC
  Administered 2024-01-13: 500 mL via INTRAVENOUS

## 2024-01-13 NOTE — ED Provider Notes (Signed)
[facility] Provider Note    Event Date/Time   First MD Initiated Contact with Patient 01/13/24 0008     (approximate)   History   Loss of Consciousness   HPI  Jeremy Reese is a 73 y.o. male with history of paroxysmal a flutter on Eliquis, history of chronic back pain, CAD, hyperlipidemia, presenting with syncopal episode.  Patient states that he was lifting something heavy, felt lightheaded, had a bit of shortness of breath, passed out.  Found himself on his knees, head was in a chair.  States no urinary symptoms, no nausea, vomiting, diarrhea, chest pain, shortness of breath.  States that he is chronic back pain that is not changed compared to prior.  Is constipated.  No unilateral calf swelling or tenderness.  No other infectious symptoms.  Was supposed to see his cardiologist today but had to cancel because his wife was sick.  On independent chart review he was evaluated by cardiology on 28 January, has chronic back pain and uses walker most of the time.  Does feel weak a lot and thinks this is secondary to the gabapentin.  He is not on Lasix.  Had an echo done in 2021 that showed an EF of 60-65%.     Physical Exam   Triage Vital Signs: ED Triage Vitals  Encounter Vitals Group     BP 01/12/24 2219 (!) 176/101     Systolic BP Percentile --      Diastolic BP Percentile --      Pulse Rate 01/12/24 2215 (!) 105     Resp 01/12/24 2215 16     Temp 01/12/24 2219 97.7 F (36.5 C)     Temp Source 01/12/24 2219 Oral     SpO2 01/12/24 2215 95 %     Weight 01/12/24 2216 246 lb 14.6 oz (112 kg)     Height 01/12/24 2216 5\' 11"  (1.803 m)     Head Circumference --      Peak Flow --      Pain Score 01/12/24 2216 0     Pain Loc --      Pain Education --      Exclude from Growth Chart --     Most recent vital signs: Vitals:   01/13/24 0200 01/13/24 0230  BP: (!) 170/85 (!) 166/89  Pulse: (!) 54 (!) 59  Resp:  18  Temp:  97.6 F (36.4 C)  SpO2:  100% 100%     General: Awake, no distress.  CV:  Good peripheral perfusion.  Resp:  Normal effort.  Clear Abd:  No distention.  Soft nontender Other:  No palpable skull deformities or tenderness, no midline cervical tenderness.  Range of motion of extremities are intact, he does have bilateral lower extremity edema that appears symmetrical.   ED Results / Procedures / Treatments   Labs (all labs ordered are listed, but only abnormal results are displayed) Labs Reviewed  BASIC METABOLIC PANEL - Abnormal; Notable for the following components:      Result Value   Glucose, Bld 115 (*)    BUN 25 (*)    Creatinine, Ser 1.58 (*)    GFR, Estimated 46 (*)    All other components within normal limits  CBC - Abnormal; Notable for the following components:   RBC 3.64 (*)    Hemoglobin 11.9 (*)    HCT 37.8 (*)    MCV 103.8 (*)    Platelets 92 (*)  All other components within normal limits  PROTIME-INR - Abnormal; Notable for the following components:   Prothrombin Time 19.5 (*)    INR 1.6 (*)    All other components within normal limits  URINALYSIS, W/ REFLEX TO CULTURE (INFECTION SUSPECTED) - Abnormal; Notable for the following components:   Color, Urine YELLOW (*)    APPearance CLEAR (*)    Hgb urine dipstick MODERATE (*)    Protein, ur >=300 (*)    All other components within normal limits  BRAIN NATRIURETIC PEPTIDE - Abnormal; Notable for the following components:   B Natriuretic Peptide 166.9 (*)    All other components within normal limits  TROPONIN I (HIGH SENSITIVITY) - Abnormal; Notable for the following components:   Troponin I (High Sensitivity) 18 (*)    All other components within normal limits  TROPONIN I (HIGH SENSITIVITY) - Abnormal; Notable for the following components:   Troponin I (High Sensitivity) 21 (*)    All other components within normal limits     EKG  Atrial fibrillation rate of 59 with PVC, normal QRS, normal QTc, T wave flattening in aVF, no  ischemic ST elevation, not significant change compared to prior   RADIOLOGY CT head on my interpretation without acute intracranial hemorrhage   PROCEDURES:  Critical Care performed: No  Procedures   MEDICATIONS ORDERED IN ED: Medications  lactated ringers bolus 500 mL (0 mLs Intravenous Stopped 01/13/24 0247)  acetaminophen (TYLENOL) tablet 1,000 mg (1,000 mg Oral Given 01/13/24 0253)     IMPRESSION / MDM / ASSESSMENT AND PLAN / ED COURSE  I reviewed the triage vital signs and the nursing notes.                              Differential diagnosis includes, but is not limited to, consider ACS, arrhythmia, electrolyte derangements, underlying infection such as UTI, mild dehydration, for his fall, his mental status is baseline, but given that he is on a blood thinner, will get a CT head as well as cervical spine.  Also get a chest x-ray.  Will give him a little bit of fluids.  Patient's presentation is most consistent with acute presentation with potential threat to life or bodily function.  Independent review of labs and imaging below.  Patient had no recurrence of syncope, not lightheaded.  We are able to get him up but he was having his chronic back pain.  He says no lightheadedness when he was up.  I believe he is safe for outpatient management.  Considered but no indication for inpatient admission at this time.  Strict return precautions given.  Discharge.  Clinical Course as of 01/13/24 0453  Fri Jan 13, 2024  0224 CT Head Wo Contrast IMPRESSION: 1. No acute intracranial abnormality. 2. No acute fracture in the cervical spine.   [TT]  0224 DG Chest 2 View IMPRESSION: Cardiomegaly with mild central congestion.   [TT]  0308 Independent review of labs, troponins are mildly elevated but stable, UA not consistent with UTI, no leukocytosis, creatinine is mildly elevated compared to prior, electrolytes not severely deranged.  CT head and neck without acute pathology, no focal  consolidation on chest x-ray. [TT]    Clinical Course User Index [TT] Jodie Echevaria Franchot Erichsen, MD     FINAL CLINICAL IMPRESSION(S) / ED DIAGNOSES   Final diagnoses:  Syncope, unspecified syncope type  Fall, initial encounter     Rx / DC Orders  ED Discharge Orders     None        Note:  This document was prepared using Dragon voice recognition software and may include unintentional dictation errors.    Claybon Jabs, MD 01/13/24 [phone]

## 2024-01-13 NOTE — Discharge Instructions (Signed)
Please contact your cardiologist to schedule an earlier follow-up appointment.

## 2024-01-13 NOTE — ED Notes (Signed)
This RN went to ambulate patient. Patient was barley able to stand due to back pain, but denied any dizziness. Patient did eventually stand and was able to take a couple steps with a walker and this RN to assist. MD made aware.

## 2024-01-24 DIAGNOSIS — I129 Hypertensive chronic kidney disease with stage 1 through stage 4 chronic kidney disease, or unspecified chronic kidney disease: Secondary | ICD-10-CM | POA: Diagnosis not present

## 2024-01-24 DIAGNOSIS — E1122 Type 2 diabetes mellitus with diabetic chronic kidney disease: Secondary | ICD-10-CM | POA: Diagnosis not present

## 2024-01-24 DIAGNOSIS — F32A Depression, unspecified: Secondary | ICD-10-CM | POA: Diagnosis not present

## 2024-01-24 DIAGNOSIS — E785 Hyperlipidemia, unspecified: Secondary | ICD-10-CM | POA: Diagnosis not present

## 2024-01-24 DIAGNOSIS — Z6834 Body mass index (BMI) 34.0-34.9, adult: Secondary | ICD-10-CM | POA: Diagnosis not present

## 2024-01-24 DIAGNOSIS — N189 Chronic kidney disease, unspecified: Secondary | ICD-10-CM | POA: Diagnosis not present

## 2024-01-24 DIAGNOSIS — E669 Obesity, unspecified: Secondary | ICD-10-CM | POA: Diagnosis not present

## 2024-01-24 DIAGNOSIS — I6523 Occlusion and stenosis of bilateral carotid arteries: Secondary | ICD-10-CM | POA: Diagnosis not present

## 2024-01-24 DIAGNOSIS — I4891 Unspecified atrial fibrillation: Secondary | ICD-10-CM | POA: Diagnosis not present

## 2024-02-01 ENCOUNTER — Ambulatory Visit: Payer: Medicare HMO | Attending: Medical

## 2024-02-01 ENCOUNTER — Ambulatory Visit (INDEPENDENT_AMBULATORY_CARE_PROVIDER_SITE_OTHER): Admitting: Internal Medicine

## 2024-02-01 ENCOUNTER — Ambulatory Visit: Payer: Self-pay

## 2024-02-01 ENCOUNTER — Encounter: Payer: Self-pay | Admitting: Internal Medicine

## 2024-02-01 VITALS — BP 150/96 | HR 67 | Ht 70.5 in | Wt 244.4 lb

## 2024-02-01 DIAGNOSIS — I2510 Atherosclerotic heart disease of native coronary artery without angina pectoris: Secondary | ICD-10-CM

## 2024-02-01 DIAGNOSIS — R55 Syncope and collapse: Secondary | ICD-10-CM | POA: Diagnosis not present

## 2024-02-01 DIAGNOSIS — I4821 Permanent atrial fibrillation: Secondary | ICD-10-CM | POA: Diagnosis not present

## 2024-02-01 DIAGNOSIS — I10 Essential (primary) hypertension: Secondary | ICD-10-CM | POA: Diagnosis not present

## 2024-02-01 NOTE — Progress Notes (Unsigned)
 Cardiology Office Note:  .   Date:  02/02/2024  ID:  Jeremy Reese, DOB 1951/04/20, MRN [id number] PCP: Mick Sell, MD  [facility] Providers Cardiologist:  Julien Nordmann, MD     History of Present Illness: .   Jeremy Reese is a 73 y.o. male with history of coronary artery disease status post remote PCI and subsequent three-vessel CABG in 2021 with concurrent left atrial appendage clipping, permanent atrial fibrillation with TIA in 2012 and known occlusion of both internal carotid arteries, hypertension, hyperlipidemia, type 2 diabetes mellitus, and chronic back pain, who is seen for urgent evaluation of dizziness and inability to tolerate echocardiogram today.  He reports about 2 months of dizziness that he describes as feeling off balance.  He is concerned that he is overmedicated.  On 01/11/2023, he suddenly became dizzy while walking in his home and fell to the ground.  The next thing he remembers is being loaded into the ambulance.  He estimates that he was not conscious (or at least unable to recall events) for about 20 minutes.  He did not have any preceding chest pain or palpitations.  He subsequently saw his PCP, Jeremy Reese, who agreed that Jeremy Reese may be overmedicated.  However, no medication changes were made.  Today, Jeremy Reese reports that he continues to feel somewhat dizzy at times though he has not passed out or fallen again.  He denies chest pain, shortness of breath, and edema.  Home blood pressures are typically 130/80 to 150/90.  Of the medications that he is on, he is most concerned that rosuvastatin could be causing some of his symptoms as he has not tolerated higher doses of this medication nor therapy with pravastatin in the past.  He was referred for echocardiogram today but was unable to move forward with the exam because he could not lie on his left side or raise his left arm due to shoulder pain and decreased range of motion.  ROS: See  HPI  Studies Reviewed: Marland Kitchen   ECG (01/11/2023): Atrial fibrillation with PVC versus aberrancy, slow ventricular response, and nonspecific T wave abnormality.  Risk Assessment/Calculations:    CHA2DS2-VASc Score = 6   This indicates a 9.7% annual risk of stroke. The patient's score is based upon: CHF History: 0 HTN History: 1 Diabetes History: 1 Stroke History: 2 Vascular Disease History: 1 Age Score: 1 Gender Score: 0         Physical Exam:   VS:  BP (!) 150/96   Pulse 67   Ht 5' 10.5" (1.791 m)   Wt 244 lb 6.4 oz (110.9 kg)   SpO2 97%   BMI 34.57 kg/m    Wt Readings from Last 3 Encounters:  02/01/24 244 lb 6.4 oz (110.9 kg)  01/12/24 246 lb 14.6 oz (112 kg)  12/27/23 247 lb 9.6 oz (112.3 kg)    General:  NAD. Neck: No JVD or HJR. Lungs: Clear to auscultation bilaterally without wheezes or crackles. Heart: Irregularly irregular rhythm with 2/6 systolic murmur. Abdomen: Soft, nontender, nondistended. Extremities: 1+ pretibial edema.  ASSESSMENT AND PLAN: .    Syncope: Jeremy Reese reports a single episode of syncope as well as frequent dizziness that he describes primarily as gait instability over the last 1 to 2 months.  ED workup at the time of his syncopal episode was largely unrevealing though mild increase in creatinine was noted suggesting that he may have been mildly volume depleted.  High-sensitivity troponin I was negligible  at 18 -> 21.  BNP was mildly elevated at 167.  Mild anemia with hemoglobin of 11.9 also noted.  His sudden dizziness and collapsed to the ground with unconsciousness for potentially up to 20 minutes is concerning for potential cardiac cause of his syncope.  I have recommended obtaining a 14-day event monitor (Zio AT).  Echocardiogram would also be helpful, though Jeremy Reese does not wish to move forward with this because of pain in his left shoulder.  He also does not believe that he would tolerate a cardiac MRI well due to claustrophobia.  Though his  ventricular rates are borderline low today, I will defer de-escalation of his carvedilol given his elevated blood pressure.  Based on results of event monitor, consultation with electrophysiology may be helpful.  We will pursue a 2-week statin holiday to see if some of his neurolyse malaise and dizziness improve, as Jeremy Reese is concerned that rosuvastatin may be driving some of his symptoms.  Permanent atrial fibrillation: Jeremy Reese notes that he felt better in the remote past while on dofetilide.  However, he has been classified as having permanent atrial fibrillation, making it unlikely that durable maintenance of sinus rhythm can be achieved.  Nonetheless, he may benefit from EP consultation in the future.  Continue anticoagulation with apixaban as long as he does not have more frequent falls or declining hemoglobin.  Coronary artery disease: No angina reported.  Negligible troponin elevation noted during recent ED visit, which is nonspecific.  Defer further workup at this time.  Continue secondary prevention, albeit with statin holiday to see if his symptoms improve.  Hypertension: Blood pressure moderately elevated today.  However, I am reluctant to make any medication changes at this time given the patient's symptoms.  I also worry that aggressive lowering of his blood pressure may worsen symptoms with his known occlusions of both carotid arteries.    Dispo: Follow-up in 6 weeks with Jeremy Reese or APP.  Signed, Yvonne Kendall, MD

## 2024-02-01 NOTE — Patient Instructions (Signed)
 Medication Instructions:  Your physician recommends the following medication changes.  Hold Rosuvastatin for 14 days, then call our office with an update regarding your symptoms.   *If you need a refill on your cardiac medications before your next appointment, please call your pharmacy*   Lab Work: No labs ordered today    Testing/Procedures: Your physician has recommended that you wear a 14 day Zio AT Live monitor.   This monitor is a medical device that records the heart's electrical activity. Doctors most often use these monitors to diagnose arrhythmias. Arrhythmias are problems with the speed or rhythm of the heartbeat. The monitor is a small device applied to your chest. You can wear one while you do your normal daily activities. While wearing this monitor if you have any symptoms to push the button and record what you felt. Once you have worn this monitor for the period of time provider prescribed (Usually 14 days), you will return the monitor device in the postage paid box/bag. Once it is returned they will download the data collected and provide [place] with a report which the provider will then review and we will call you with those results.   Important tips:  Avoid showering during the first 24 hours of wearing the monitor. Avoid excessive sweating to help maximize wear time. Do not submerge the device, no hot tubs, and no swimming pools. Keep any lotions or oils away from the patch. After 24 hours you may shower with the patch on. Take brief showers with your back facing the shower head.  Do not remove patch once it has been placed because that will interrupt data and decrease adhesive wear time. Push the button when you have any symptoms and write down what you were feeling. Once you have completed wearing your monitor, remove and place into box which has postage paid and place in your outgoing mailbox.  If for some reason you have misplaced your box then call our office and we can  provide another box and/or mail it off for you. Keep the transmitter within 10 feet at all times.  Expect a welcome phone call within 24-48 hrs of application from Zio.  This call will include your copay information, so please answer any unknown phone calls while wearing Zio (it could also be important information about your heart) The envelope to return Zio is in the back of the transmitter. Removal instructions are on the last page of the symptom diary.  Place the patch sticky side up inside the transmitter and the symptom diary inside the envelope to return on your last wear day inside your mailbox or any USPS mailbox.    Follow-Up: At [facility], you and your health needs are our priority.  As part of our continuing mission to provide you with exceptional heart care, we have created designated Provider Care Teams.  These Care Teams include your primary Cardiologist (physician) and Advanced Practice Providers (APPs -  Physician Assistants and Nurse Practitioners) who all work together to provide you with the care you need, when you need it.  We recommend signing up for the patient portal called "MyChart".  Sign up information is provided on this After Visit Summary.  MyChart is used to connect with patients for Virtual Visits (Telemedicine).  Patients are able to view lab/test results, encounter notes, upcoming appointments, etc.  Non-urgent messages can be sent to your provider as well.   To learn more about what you can do with MyChart, go to [email or web address].  Your next appointment:   6 week(s)  Provider:   Julien Nordmann, MD

## 2024-02-02 ENCOUNTER — Encounter: Payer: Self-pay | Admitting: Internal Medicine

## 2024-02-02 DIAGNOSIS — I4821 Permanent atrial fibrillation: Secondary | ICD-10-CM | POA: Insufficient documentation

## 2024-02-02 DIAGNOSIS — R55 Syncope and collapse: Secondary | ICD-10-CM | POA: Insufficient documentation

## 2024-02-06 ENCOUNTER — Telehealth: Payer: Self-pay | Admitting: Cardiovascular Disease

## 2024-02-06 NOTE — Telephone Encounter (Signed)
 Duplicated

## 2024-02-06 NOTE — Telephone Encounter (Signed)
 Patient is returning phone call.

## 2024-02-06 NOTE — Telephone Encounter (Signed)
 Call placed back to the patient. The patient stated that he has been having weakness. He was seen in the office on 3/5 when he was asked to hold the rosuvastatin but this has not helped.   The patient feels like he is being over medicated. He stated that his blood pressure normally runs 150/90's. He stated that he feels weak when he ambulates but denies any pain. This has been going on for two months.   He stated that he felt like this before after his cath in 2021 and it was a certain medication that was making him feel poorly but he does not remember what the medication was.

## 2024-02-06 NOTE — Telephone Encounter (Signed)
 Pt c/o medication issue:  1. Name of Medication:   rosuvastatin (CRESTOR) 40 MG tablet    2. How are you currently taking this medication (dosage and times per day)? Been holding since 02/02/24   3. Are you having a reaction (difficulty breathing--STAT)? No   4. What is your medication issue? Pt called in stating he has been feeling fatigue, weak, and dizzy since holding this medication. He asked if he should go back on it. Please advise.

## 2024-02-06 NOTE — Telephone Encounter (Signed)
 STAT if patient feels like he/she is going to faint   1. Are you feeling dizzy, lightheaded, or faint right now? dizzy    2. Have you passed out?  2 weeks ago (If yes move to .SYNCOPE[facility])   3. Do you have any other symptoms? Very tired and fatigued   4. Have you checked your HR and BP (record if available)? 150/90

## 2024-02-06 NOTE — Telephone Encounter (Signed)
 Left a message for the patient to call back.

## 2024-02-06 NOTE — Telephone Encounter (Signed)
 Left message for pt to call.

## 2024-02-06 NOTE — Telephone Encounter (Signed)
 Patient is returning RN's call.   He states he feels worse since stopping the rosuvastatin to the point of hardly being able to put one foot in front of the other.   He states he remembers being put on a medication after having a stent placed back in 2021 causing this same reaction that resolved after stopping.   Please advise.

## 2024-02-09 ENCOUNTER — Telehealth: Payer: Self-pay | Admitting: Cardiovascular Disease

## 2024-02-09 NOTE — Telephone Encounter (Signed)
 Pt is returning nurses' call.

## 2024-02-09 NOTE — Telephone Encounter (Signed)
 Pt returning nurse's call. Please advise

## 2024-02-09 NOTE — Telephone Encounter (Signed)
 The patient was calling in reference to his prior phone call. This had been sent to Dr. Mariah Milling. He stated that he feels like he is getting overmedicated. Please refer to previous phone call from 02/06/24 for detail.

## 2024-02-09 NOTE — Telephone Encounter (Signed)
 Left a message for the patient to call back.

## 2024-02-09 NOTE — Telephone Encounter (Signed)
 Pt states that he thinks that he is taking too much medication. He would like for them to be reviewed and also like a c/b regarding Monitor that has yet to be received. Please advise

## 2024-02-10 NOTE — Telephone Encounter (Signed)
 Called patient and notified him of the following from Dr. Mariah Milling.  I have reviewed the notes from Dr. Okey Dupre who he saw on March 3  I also looked at his current medications  I do not see a reason for his symptoms  Wonder if he should talk with Dr. Sampson Goon to get home PT  Appears he has a follow-up appointment with me for further discussion  Thx  TGollan   Patient verbalizes understanding.

## 2024-02-27 ENCOUNTER — Other Ambulatory Visit: Payer: Self-pay | Admitting: Physician Assistant

## 2024-02-27 ENCOUNTER — Ambulatory Visit
Admission: RE | Admit: 2024-02-27 | Discharge: 2024-02-27 | Disposition: A | Discharge status: Home / Self Care | Source: Ambulatory Visit | Attending: Physician Assistant | Admitting: Physician Assistant

## 2024-02-27 DIAGNOSIS — M7989 Other specified soft tissue disorders: Secondary | ICD-10-CM | POA: Insufficient documentation

## 2024-02-27 DIAGNOSIS — I480 Paroxysmal atrial fibrillation: Secondary | ICD-10-CM | POA: Diagnosis not present

## 2024-03-12 ENCOUNTER — Other Ambulatory Visit (INDEPENDENT_AMBULATORY_CARE_PROVIDER_SITE_OTHER): Payer: Self-pay | Admitting: Nurse Practitioner

## 2024-03-12 ENCOUNTER — Telehealth: Payer: Self-pay | Admitting: Cardiovascular Disease

## 2024-03-12 ENCOUNTER — Ambulatory Visit: Attending: Internal Medicine | Admitting: Internal Medicine

## 2024-03-12 DIAGNOSIS — R55 Syncope and collapse: Secondary | ICD-10-CM

## 2024-03-12 DIAGNOSIS — I6523 Occlusion and stenosis of bilateral carotid arteries: Secondary | ICD-10-CM

## 2024-03-12 NOTE — Telephone Encounter (Signed)
 I called patient to see if he wanted to come in sooner to see the PA. Patient decline but states that he is suppose to be getting a heart monitor. Patient would like a call back on the status of the monitor. Please advise

## 2024-03-12 NOTE — Telephone Encounter (Signed)
 Spoke with Zio rep and orders are not crossing over for this patient. We are looking into this and will update as soon as we get more information.

## 2024-03-12 NOTE — Telephone Encounter (Signed)
 Called Zio rep and he searched system and there is no monitor for this patient. He recommends that the nurse call the patient to see if they received one. If so then ask for the serial number which is on the box. Advised that order was placed and everything from what I can see it was done correctly.

## 2024-03-12 NOTE — Telephone Encounter (Signed)
 Spoke with patient to let him know we will send monitor and if has not received one by Monday then give us  a call back. He verbalized understanding

## 2024-03-13 ENCOUNTER — Encounter (INDEPENDENT_AMBULATORY_CARE_PROVIDER_SITE_OTHER): Payer: Self-pay | Admitting: Nurse Practitioner

## 2024-03-13 ENCOUNTER — Ambulatory Visit (INDEPENDENT_AMBULATORY_CARE_PROVIDER_SITE_OTHER): Payer: Medicare HMO

## 2024-03-13 ENCOUNTER — Ambulatory Visit (INDEPENDENT_AMBULATORY_CARE_PROVIDER_SITE_OTHER): Payer: Medicare HMO | Admitting: Nurse Practitioner

## 2024-03-13 VITALS — BP 176/74 | HR 57 | Resp 16 | Wt 236.4 lb

## 2024-03-13 DIAGNOSIS — I6523 Occlusion and stenosis of bilateral carotid arteries: Secondary | ICD-10-CM

## 2024-03-13 DIAGNOSIS — I10 Essential (primary) hypertension: Secondary | ICD-10-CM | POA: Diagnosis not present

## 2024-03-13 DIAGNOSIS — E785 Hyperlipidemia, unspecified: Secondary | ICD-10-CM | POA: Diagnosis not present

## 2024-03-13 NOTE — Telephone Encounter (Signed)
 Late entry:  03/12/24 Manual enrollment completed and spoke with Zio Rep.

## 2024-03-14 ENCOUNTER — Encounter (INDEPENDENT_AMBULATORY_CARE_PROVIDER_SITE_OTHER): Payer: Self-pay | Admitting: Nurse Practitioner

## 2024-03-14 NOTE — Progress Notes (Signed)
 Subjective:    Patient ID: Jeremy Reese, male    DOB: Jul 13, 1951, 73 y.o.   MRN: [id number] Chief Complaint  Patient presents with   Follow-up    37yr carotid follow up    The patient is seen for follow up evaluation of carotid stenosis. The carotid stenosis followed by ultrasound.   The patient denies amaurosis fugax. There is no recent history of TIA symptoms or focal motor deficits. There is no prior documented CVA.  He does endorse having a recent syncopal event at the beginning of March.  He notes he was walking from his kitchen to his living room and passed out and had to call EMS as his wife was not able to arouse him easily.  He also notes that he has upcoming workup by cardiology in the form of a Zio patch once he receives it.  The patient is taking enteric-coated aspirin 81 mg daily.  There is no history of migraine headaches. There is no history of seizures.  No recent shortening of the patient's walking distance or new symptoms consistent with claudication.  No history of rest pain symptoms. No new ulcers or wounds of the lower extremities have occurred.  There is no history of DVT, PE or superficial thrombophlebitis. No documented recent episodes of angina or shortness of breath documented.   Today carotid duplex was not completed as it was extremely painful for the patient.  Per his request it was stopped at the midportion.    Review of Systems  Neurological:  Positive for syncope.  All other systems reviewed and are negative.      Objective:   Physical Exam Vitals reviewed.  HENT:     Head: Normocephalic.  Cardiovascular:     Rate and Rhythm: Normal rate. Rhythm irregular.  Pulmonary:     Effort: Pulmonary effort is normal.  Musculoskeletal:     Right lower leg: Edema present.     Left lower leg: Edema present.  Skin:    General: Skin is warm and dry.  Neurological:     Mental Status: He is alert and oriented to person, place, and time.   Psychiatric:        Mood and Affect: Mood normal.        Behavior: Behavior normal.        Thought Content: Thought content normal.        Judgment: Judgment normal.     BP (!) 176/74   Pulse (!) 57   Resp 16   Wt 236 lb 6.4 oz (107.2 kg)   BMI 33.44 kg/m   Past Medical History:  Diagnosis Date   Atrial flutter, paroxysmal (HCC)    Carotid artery disease without cerebral infarction [facility])    Coronary artery disease 2011   Flatback syndrome of thoracolumbar region    H/O calcium pyrophosphate deposition disease (CPPD)    History of TIA (transient ischemic attack)    Hyperlipidemia    Paroxysmal A-fib (HCC)    Spinal stenosis of lumbar region without neurogenic claudication     Social History   Socioeconomic History   Marital status: Married    Spouse name: Not on file   Number of children: Not on file   Years of education: Not on file   Highest education level: Not on file  Occupational History   Not on file  Tobacco Use   Smoking status: Former    Current packs/day: 0.00    Average packs/day: 0.3 packs/day for 35.0  years (8.8 ttl pk-yrs)    Types: Cigarettes    Start date: 11/29/1958    Quit date: 11/29/1993    Years since quitting: 30.3   Smokeless tobacco: Never  Vaping Use   Vaping status: Never Used  Substance and Sexual Activity   Alcohol use: Yes    Alcohol/week: 1.0 standard drink of alcohol    Types: 1 Cans of beer per week   Drug use: No   Sexual activity: Not Currently  Other Topics Concern   Not on file  Social History Narrative   Not on file   Social Drivers of Health   Financial Resource Strain: Low Risk  (01/24/2024)   Received from [facility]   Overall Financial Resource Strain (CARDIA)    Difficulty of Paying Living Expenses: Not hard at all  Food Insecurity: No Food Insecurity (01/24/2024)   Received from [facility]   Hunger Vital Sign    Worried About Running Out of Food in the Last Year: Never  true    Ran Out of Food in the Last Year: Never true  Transportation Needs: No Transportation Needs (01/24/2024)   Received from [facility]    In the past 12 months, has lack of transportation kept you from medical appointments or from getting medications?: No    Lack of Transportation (Non-Medical): No  Physical Activity: Not on file  Stress: Not on file  Social Connections: Not on file  Intimate Partner Violence: Not on file    Past Surgical History:  Procedure Laterality Date   BILATERAL CARPAL TUNNEL RELEASE     CARDIAC CATHETERIZATION     CIRCUMCISION, NON-NEWBORN     CLIPPING OF ATRIAL APPENDAGE N/A 09/18/2020   Procedure: CLIPPING OF ATRIAL APPENDAGE USING 45 ATRICURE LAA  EXCLUSION SYSTEM;  Surgeon: Kerin Perna, MD;  Location: [facility] OR;  Service: Open Heart Surgery;  Laterality: N/A;   CORONARY ANGIOPLASTY WITH STENT PLACEMENT  2011   CORONARY ARTERY BYPASS GRAFT N/A 09/18/2020   Procedure: CORONARY ARTERY BYPASS GRAFTING (CABG)X 3, ON PUMP, USING LEFT INTERAL MAMMARY ARTERY AND ENDOSCOPICALLY HARVESTED RIGHT GREATER SAPHENOUS VEIN. LIMA TO LAD, SVG TO OM, SVG TO PD;  Surgeon: Kerin Perna, MD;  Location: [facility] OR;  Service: Open Heart Surgery;  Laterality: N/A;   ENDOVEIN HARVEST OF GREATER SAPHENOUS VEIN Right 09/18/2020   Procedure: ENDOVEIN HARVEST OF GREATER SAPHENOUS VEIN;  Surgeon: Kerin Perna, MD;  Location: [facility] OR;  Service: Open Heart Surgery;  Laterality: Right;   LEFT HEART CATH AND CORONARY ANGIOGRAPHY Left 09/12/2020   Procedure: LEFT HEART CATH AND CORONARY ANGIOGRAPHY;  Surgeon: Antonieta Iba, MD;  Location: [facility] INVASIVE CV LAB;  Service: Cardiovascular;  Laterality: Left;   TEE WITHOUT CARDIOVERSION N/A 09/18/2020   Procedure: TRANSESOPHAGEAL ECHOCARDIOGRAM (TEE);  Surgeon: Donata Clay, Theron Arista, MD;  Location: [facility] OR;  Service: Open Heart Surgery;  Laterality: N/A;    Family History  Problem Relation Age of  Onset   Heart disease Mother 46       CABG x 3    Hypertension Mother    Hypertension Father    Heart disease Father    Lung cancer Father    Kidney disease Other    Colon cancer Paternal Grandmother    Liver disease Neg Hx    Stomach cancer Neg Hx    Esophageal cancer Neg Hx    Pancreatic cancer Neg Hx  Allergies  Allergen Reactions   Propafenone Anxiety   Codeine Nausea Only   Penicillins Rash       Latest Ref Rng & Units 01/12/2024   10:19 PM 12/29/2020    8:28 AM 12/26/2020    3:17 PM  CBC  WBC 4.0 - 10.5 K/uL 6.1  6.2  6.5   Hemoglobin 13.0 - 17.0 g/dL 16.1  09.6  04.5   Hematocrit 39.0 - 52.0 % 37.8  42.1  37.2   Platelets 150 - 400 K/uL 92  155  152       CMP     Component Value Date/Time   NA 138 01/12/2024 2219   NA 140 09/05/2020 0950   K 4.5 01/12/2024 2219   CL 104 01/12/2024 2219   CO2 23 01/12/2024 2219   GLUCOSE 115 (H) 01/12/2024 2219   BUN 25 (H) 01/12/2024 2219   BUN 13 09/05/2020 0950   CREATININE 1.58 (H) 01/12/2024 2219   CALCIUM 9.3 01/12/2024 2219   PROT 6.7 12/14/2020 0701   ALBUMIN 3.8 12/14/2020 0701   AST 69 (H) 12/14/2020 0701   ALT 98 (H) 12/14/2020 0701   ALKPHOS 57 12/14/2020 0701   BILITOT 1.1 12/14/2020 0701   GFRNONAA 46 (L) 01/12/2024 2219     No results found.     Assessment & Plan:   1. Bilateral carotid artery occlusion (Primary) Today the patient was unable to tolerate having the full carotid ultrasound done.  However given his recent syncopal episode, and his known history of bilateral internal carotid artery occlusions, I think it would be prudent to evaluate for possible steal symptoms that may have caused the syncopal event.  I have the patient undergo CT angiogram and he will return to review results.   2. Primary hypertension Continue antihypertensive medications as already ordered, these medications have been reviewed and there are no changes at this time.  3. Hyperlipidemia, unspecified hyperlipidemia  type Continue statin as ordered and reviewed, no changes at this time   Current Outpatient Medications on File Prior to Visit  Medication Sig Dispense Refill   amLODipine-olmesartan (AZOR) 5-20 MG tablet TAKE 1 TABLET BY MOUTH ONCE DAILY 90 tablet 0   apixaban (ELIQUIS) 5 MG TABS tablet Take 1 tablet (5 mg total) by mouth 2 (two) times daily. 60 tablet 0   aspirin EC 81 MG tablet Take 1 tablet (81 mg total) by mouth daily. 90 tablet 3   buPROPion (WELLBUTRIN XL) 300 MG 24 hr tablet Take 1 tablet (300 mg total) by mouth daily. 30 tablet 1   calcipotriene (DOVONOX) 0.005 % cream Apply to aa's psoriasis QD PRN flares. 60 g 0   carvedilol (COREG) 25 MG tablet Take 1 tablet (25 mg total) by mouth 2 (two) times daily with a meal. 60 tablet 3   ezetimibe (ZETIA) 10 MG tablet TAKE ONE (1) TABLET EACH DAY 30 tablet 3   furosemide (LASIX) 20 MG tablet Take 1 tablet (20 mg total) by mouth 3 (three) times a week. 36 tablet 3   gabapentin (NEURONTIN) 300 MG capsule Take 2 capsules (600 mg total) by mouth 3 (three) times daily. (Patient taking differently: Take 300 mg by mouth 3 (three) times daily.) 180 capsule 1   LORazepam (ATIVAN) 1 MG tablet Take 1 tablet (1 mg total) by mouth 2 (two) times daily as needed for anxiety or sleep. Script must last 30 days from fill date. 60 tablet 1   mometasone (ELOCON) 0.1 % cream Apply to  affected skin once a day 5 days a week 45 g 3   nitroGLYCERIN (NITROSTAT) 0.4 MG SL tablet Place 1 tablet (0.4 mg total) under the tongue every 5 (five) minutes as needed for chest pain. 25 tablet 3   polyethylene glycol (MIRALAX / GLYCOLAX) 17 g packet Take 17 g by mouth daily.     potassium chloride (KLOR-CON) 10 MEQ tablet TAKE ONE TABLET BY MOUTH EVERY DAY AS NEEDED **TAKE WHEN YOU TAKE YOUR FUROSEMIDE (FLUID PILL)** 90 tablet 2   rosuvastatin (CRESTOR) 40 MG tablet Take 0.5 tablets (20 mg total) by mouth daily. 90 tablet 3   traMADol (ULTRAM) 50 MG tablet Take by mouth at bedtime.      sildenafil (REVATIO) 20 MG tablet Take 1 to 5 tablets 1 hour prior to intercourse. (Patient not taking: Reported on 03/13/2024) 15 tablet 0   Sodium Sulfate-Mag Sulfate-KCl (SUTAB) 1479-225-188 MG TABS Use as directed for colonoscopy. MANUFACTURER CODES!! BIN: [id number] PCN: [id number] GROUP: [id number] MEMBER ID: [id number];[employer] AS SECONDARY INSURANCE ;NO PRIOR AUTHORIZATION (Patient not taking: Reported on 03/13/2024) 24 tablet 0   No current facility-administered medications on file prior to visit.    There are no Patient Instructions on file for this visit. No follow-ups on file.   Fae Blossom E Rolin Schult, NP

## 2024-03-19 DIAGNOSIS — R55 Syncope and collapse: Secondary | ICD-10-CM | POA: Diagnosis not present

## 2024-03-22 ENCOUNTER — Telehealth: Payer: Self-pay | Admitting: Cardiovascular Disease

## 2024-03-22 ENCOUNTER — Ambulatory Visit: Admission: RE | Admit: 2024-03-22 | Source: Ambulatory Visit

## 2024-03-22 NOTE — Telephone Encounter (Signed)
 Spoke with Jeremy Reese who stated he has already spoken to the CT department and rescheduled for 04/06/24.

## 2024-03-22 NOTE — Telephone Encounter (Signed)
  1. Is this related to a heart monitor you are wearing?  (If the patient says no, please ask     if they are caling about ICD/pacemaker.) yes  2. What is your issue?? Pt is having CT scan today at 2:30 can he have heart monitor on and do test  Please route to covering RN/CMA/RMA for results. Route to monitor technicians or your monitor tech representative for your site for any technical concerns

## 2024-03-24 ENCOUNTER — Other Ambulatory Visit: Payer: Self-pay | Admitting: Cardiovascular Disease

## 2024-03-26 ENCOUNTER — Other Ambulatory Visit: Payer: Self-pay | Admitting: Emergency Medicine

## 2024-03-26 ENCOUNTER — Telehealth (INDEPENDENT_AMBULATORY_CARE_PROVIDER_SITE_OTHER): Payer: Self-pay | Admitting: Vascular Surgery

## 2024-03-26 ENCOUNTER — Telehealth: Payer: Self-pay | Admitting: Cardiovascular Disease

## 2024-03-26 MED ORDER — CARVEDILOL 25 MG PO TABS
12.5000 mg | ORAL_TABLET | Freq: Two times a day (BID) | ORAL | 3 refills | Status: AC
Start: 2024-03-26 — End: ?

## 2024-03-26 NOTE — Telephone Encounter (Signed)
 [employer] is calling about an alert. Transferred to triage.

## 2024-03-26 NOTE — Telephone Encounter (Signed)
 STAT call from [employer], Alesia:  This pt had a autotrigger for sinus brady. At 4:17 am this morning pt was noted: 40 BPM for 60 seconds.  Strip for this is in [employer].  Pt called by this RN just now and reports feeling "fine" today.  Reports at approx 8-9 pm last night (03/25/24) pt had shooting pain down right arm and hand that lasted approx 1 min then passed.  No other associated s/sx.  Pain given ED precautions and advised to monitor to follow-up.

## 2024-03-26 NOTE — Telephone Encounter (Signed)
 Given prior syncope and recorded bradycardia, I recommend reducing carvedilol  to 12.5 mg BID and completing event monitor.  Sammy Crisp, MD [facility]

## 2024-03-26 NOTE — Telephone Encounter (Signed)
 Called pt and pt spouse lvm for both TCB to schedule for CT results   Fu CT results see JD ONLY

## 2024-03-26 NOTE — Progress Notes (Signed)
 The patient has been notified of the Dr Colby Daub recommendations. Pt verbalized understanding. All questions (if any) were answered.  Pt reminded of upcoming appt and encouraged to call if any concerns or questions arise  "Given prior syncope and recorded bradycardia, I recommend reducing carvedilol  to 12.5 mg BID and completing event monitor.   Sammy Crisp, MD [facility]"  Med changed on the list

## 2024-04-02 ENCOUNTER — Ambulatory Visit: Admitting: Internal Medicine

## 2024-04-06 ENCOUNTER — Ambulatory Visit
Admission: RE | Admit: 2024-04-06 | Discharge: 2024-04-06 | Disposition: A | Discharge status: Home / Self Care | Source: Ambulatory Visit | Attending: Nurse Practitioner

## 2024-04-06 DIAGNOSIS — I6523 Occlusion and stenosis of bilateral carotid arteries: Secondary | ICD-10-CM

## 2024-04-09 ENCOUNTER — Telehealth (INDEPENDENT_AMBULATORY_CARE_PROVIDER_SITE_OTHER): Payer: Self-pay

## 2024-04-09 ENCOUNTER — Ambulatory Visit: Admitting: Cardiovascular Disease

## 2024-04-09 NOTE — Telephone Encounter (Signed)
 Patient left a message stating that he was scheduled for CT on 04/06/24. Patient CT was not completed due to him not able to lay on his back.  Patient appointment will stay as scheduled. I left a message on patient voicemail to return call to the office.

## 2024-04-10 ENCOUNTER — Ambulatory Visit (INDEPENDENT_AMBULATORY_CARE_PROVIDER_SITE_OTHER): Admitting: Vascular Surgery

## 2024-04-10 NOTE — Telephone Encounter (Signed)
 Let's have him come in and discuss with Jeremy Reese since he had syncopal episodes

## 2024-04-10 NOTE — Telephone Encounter (Signed)
 Pt states he has not passed out ever and he is 73 years old.  He also states that now is not the time for him to come in and he will pass on this visit.  I explained that he needed to come in and discuss further treatment options and that this is what the dr's think is in his best interest.  He still states he will call us  in a couple months if he thinks he needs to be seen he is already taken all kinds of medicines and he just does not see what good talking about anything will do.  I let him know to call our office if he needs anything.

## 2024-04-10 NOTE — Telephone Encounter (Signed)
 Pt states he did not have an US  done that he remembers and his back pain will not get any better so should he keep his appt this afternoon or not since he cannot tolerate the tests.

## 2024-04-10 NOTE — Telephone Encounter (Signed)
 We tried to do an US  and he wasn't able to tolerate that.  Does he feel that in time his back pain may improve enough to allow us  to do these scans?

## 2024-04-13 ENCOUNTER — Encounter: Payer: Self-pay | Admitting: Medical

## 2024-04-13 ENCOUNTER — Ambulatory Visit: Attending: Medical | Admitting: Medical

## 2024-04-13 VITALS — BP 130/60 | HR 76 | Resp 16 | Ht 70.0 in | Wt 239.8 lb

## 2024-04-13 DIAGNOSIS — I2510 Atherosclerotic heart disease of native coronary artery without angina pectoris: Secondary | ICD-10-CM | POA: Diagnosis not present

## 2024-04-13 DIAGNOSIS — E782 Mixed hyperlipidemia: Secondary | ICD-10-CM

## 2024-04-13 DIAGNOSIS — I4821 Permanent atrial fibrillation: Secondary | ICD-10-CM

## 2024-04-13 DIAGNOSIS — I5032 Chronic diastolic (congestive) heart failure: Secondary | ICD-10-CM

## 2024-04-13 DIAGNOSIS — I10 Essential (primary) hypertension: Secondary | ICD-10-CM

## 2024-04-13 DIAGNOSIS — R55 Syncope and collapse: Secondary | ICD-10-CM

## 2024-04-13 MED ORDER — ROSUVASTATIN CALCIUM 40 MG PO TABS: 20.0000 mg | ORAL_TABLET | Freq: Every day | ORAL | Status: DC

## 2024-04-13 MED ORDER — ROSUVASTATIN CALCIUM 20 MG PO TABS: 20.0000 mg | ORAL_TABLET | Freq: Every day | ORAL | 3 refills | Status: AC

## 2024-04-13 NOTE — Progress Notes (Signed)
 Cardiology Office Note:  .   Date:  04/18/2024  ID:  Jeremy Reese, DOB 08/31/1951, MRN [id number] PCP: Eartha Gold, MD  [facility] Providers Cardiologist:  Belva Boyden, MD {    History of Present Illness: .   Jeremy Reese is a 73 y.o. male with a hx of CAD s/p remote PCI and CABGx3 in 2021 with left atrial appendage clipping, TIA 2012, bilateral occlusion of ICA monitored by Dr. Vonna Guardian, permanent Afib, HLD, chronic back pain who presents for 1-2 month follow-up.    Cardiac cath in 2010 with PCI to the left circumflex. Per previous documentation cardiac cath at that time with 95% proximal circumflex, 60% mid LAD.  Per previous documentation transesophageal echo on 08/11/2010 with PFO, EF 50%, left atrium moderately dilated, moderate MR, mild to moderate TR.  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  He had previous cardioversion 01/2015.   Patient was seen 09/05/2020 in the office reporting worsening angina for the last month.  Patient was set up for cardiac catheterization.  Cardiac cath 09/12/2020 showed 95% stenosis ostial circumflex to proximal circumflex, 70% stenosis ostial LAD to proximal LAD, 60% mid LAD stenosis.  Patient ultimately underwent CABG x 3 and left atrial appendage clipping.  The patient was last seen  01/2024 for syncope he described as gait instability. Found to have mlid dehydration, BNP 167, mild anemia. Heart monitor was ordered. Statin holiday was pursued and echo was ordered.   Today, the patient has a heart monitor on and needs it taken off. He denies any further syncope. Vascular is looking into steal syndrome and carotid disease. He denies chest pain or SOB. He has occasional spasm in his chest. He reports  lower leg edema with weeping. Says he can't do an echocardiogram because he can't lay on  his left for 40 minutes. He says he is unable to lay back. He sleeps in a recliner, all due to back pain. He normally takes lasix  20mg  daily. Being off the statin made no difference.    Studies Reviewed: Aaron Aas   EKG Interpretation Date/Time:  Friday Apr 13 2024 14:56:54 EDT Ventricular Rate:  74 PR Interval:    QRS Duration:  98 QT Interval:  400 QTC Calculation: 444 R Axis:   -14  Text Interpretation: Atrial fibrillation When compared with ECG of 12-Jan-2024 22:20, No significant change was found Confirmed by Gennaro Khat, Adna Nofziger ([id number]) on 04/18/2024 1:03:08 PM    US  carotid 02/2024 Summary:  Right Carotid: Evidence consistent with a total occlusion of the right  ICA. The                 ECA appears >50% stenosed. Patient became too uncomfortable  lying                on his back following insonation of ICA. Patient did not  want to                 continue the exam. Vertebral arteries were not insonnated  at the                 time the patient ended the exam.       *See table(s) above for measurements and observations.      Echo 08/2020  1. Left ventricular ejection fraction, by estimation, is 60 to 65%. The  left ventricle has normal function. The left ventricle has no regional  wall motion abnormalities. There is moderate left ventricular hypertrophy.  Left ventricular diastolic  parameters are indeterminate.   2. Right ventricular systolic function is normal. The right ventricular  size is normal.   3. Left atrial size was severely dilated.   4. Right atrial size was mildly dilated.   5. The mitral valve is grossly normal. Mild mitral valve regurgitation.  No evidence of mitral stenosis. Moderate mitral annular calcification.   6. The aortic valve is abnormal. There is severe calcifcation of the  aortic valve. Aortic valve regurgitation is not visualized. Aortic valve  sclerosis/calcification is present, without any evidence of aortic  stenosis. Mean systolic gradient 9  mmHg.    Cardiac cath 08/2020 Ost Cx to Prox Cx lesion is 95% stenosed. Ost LAD to Prox LAD lesion is 70% stenosed. Mid LAD lesion is 60% stenosed.     Physical Exam:   VS:  BP 130/60 (BP Location: Left Arm, Patient Position: Sitting, Cuff Size: Large)   Pulse 76   Resp 16   Ht 5\' 10"  (1.778 m)   Wt 239 lb 12.8 oz (108.8 kg)   SpO2 97%   BMI 34.41 kg/m    Wt Readings from Last 3 Encounters:  04/13/24 239 lb 12.8 oz (108.8 kg)  03/13/24 236 lb 6.4 oz (107.2 kg)  02/01/24 244 lb 6.4 oz (110.9 kg)    GEN: Well nourished, well developed in no acute distress NECK: No JVD; No carotid bruits CARDIAC: RRR, no murmurs, rubs, gallops RESPIRATORY:  Clear to auscultation without rales, wheezing or rhonchi  ABDOMEN: Soft, non-tender, non-distended EXTREMITIES:  No edema; No deformity   ASSESSMENT AND PLAN: .    Syncope Patient reported syncope and dizziness that he described as gait instability. He us  undergoing carotid work up for steal syndrome by vascular surgery. CTA neck has been ordered. He denies further dizziness or syncope. He needs to mail in the heart monitor. He is unable to do an echo due to chronic left shoulder and back pain and is unable to lay on the left side. He would also not tolerate cMRI due to claustrophobia. He was given a statin holiday without changes, we will restart this.    Lower leg edema HFpEF Patient has 1-2+ lower leg edema, says it's worse than prior. I will increase lasix  to 20mg  BID x 3 days, then back down to 20mg  daily. May need higher lasix  dose at baseline. Can reassess at follow-up.   Permanent Afib EKG shows rate control Afib. May need to see EP in the future, based off heart monitor results. Continue Eliquis  5mg  BID. Continue Coreg  for rate control.   CAD No chest pain reported. No further work-up at this time. Continue ASA, Coreg  and Zetia .   HLD LDL 29. Continue Crestor  20mg  daily.        Dispo: Follow-up in 3 months  Signed, Christia Coaxum Rebekah Canada, PA-C

## 2024-04-13 NOTE — Patient Instructions (Addendum)
 Medication Instructions:  Your physician recommends the following medication changes.  INCREASE: Lasix  20 mg by mouth twice day for 3 days, then decrease back to 20 mg daily  RESTART: Rosuvastatin  20 mg by mouth daily   *If you need a refill on your cardiac medications before your next appointment, please call your pharmacy*  Lab Work: No labs ordered today   Testing/Procedures: No test ordered today   Follow-Up: At [facility], you and your health needs are our priority.  As part of our continuing mission to provide you with exceptional heart care, our providers are all part of one team.  This team includes your primary Cardiologist (physician) and Advanced Practice Providers or APPs (Physician Assistants and Nurse Practitioners) who all work together to provide you with the care you need, when you need it.  Your next appointment:   3 month(s)  Provider:   You may see Timothy Gollan, MD or one of the following Advanced Practice Providers on your designated Care Team:   Laneta Pintos, NP Gildardo Labrador, PA-C Varney Gentleman, PA-C Cadence [employer], PA-C Ronald Cockayne, NP Morey Ar, NP    We recommend signing up for the patient portal called "MyChart".  Sign up information is provided on this After Visit Summary.  MyChart is used to connect with patients for Virtual Visits (Telemedicine).  Patients are able to view lab/test results, encounter notes, upcoming appointments, etc.  Non-urgent messages can be sent to your provider as well.   To learn more about what you can do with MyChart, go to [email or web address].

## 2024-04-17 ENCOUNTER — Encounter (INDEPENDENT_AMBULATORY_CARE_PROVIDER_SITE_OTHER): Payer: Self-pay

## 2024-04-17 ENCOUNTER — Telehealth: Payer: Self-pay

## 2024-04-17 DIAGNOSIS — I5032 Chronic diastolic (congestive) heart failure: Secondary | ICD-10-CM

## 2024-04-17 DIAGNOSIS — R600 Localized edema: Secondary | ICD-10-CM

## 2024-04-17 NOTE — Telephone Encounter (Signed)
 Pt made aware and ECHO order placed   Furth, [employer], PA-C  [place], Josphine Nims Cc: Chapman Commodore, RN Yes sitting up completely. Ok well we can try with you. Thank you!  Gabriellah [name], can we let the patient know we can try to get an echo sitting up and see if he is willing to try?

## 2024-04-18 ENCOUNTER — Telehealth: Payer: Self-pay

## 2024-04-18 DIAGNOSIS — I6523 Occlusion and stenosis of bilateral carotid arteries: Secondary | ICD-10-CM

## 2024-04-18 NOTE — Telephone Encounter (Signed)
 Referral order placed   Furth, Cadence H, PA-C  Chapman Commodore, RN Patient needs to go back to vascular to discuss carotid US  please

## 2024-04-27 ENCOUNTER — Ambulatory Visit: Attending: Medical

## 2024-04-27 DIAGNOSIS — I5032 Chronic diastolic (congestive) heart failure: Secondary | ICD-10-CM | POA: Diagnosis not present

## 2024-04-27 DIAGNOSIS — R600 Localized edema: Secondary | ICD-10-CM

## 2024-05-01 LAB — ECHOCARDIOGRAM COMPLETE
AR max vel: 0.94 cm2
AV Area VTI: 0.86 cm2
AV Area mean vel: 0.94 cm2
AV Mean grad: 13 mmHg
AV Peak grad: 22.9 mmHg
Ao pk vel: 2.4 m/s
Est EF: >55
S' Lateral: 2.7 cm
Single Plane A4C EF: 57 %

## 2024-05-02 ENCOUNTER — Ambulatory Visit: Payer: Self-pay | Admitting: Medical

## 2024-05-04 ENCOUNTER — Telehealth: Payer: Self-pay

## 2024-05-04 ENCOUNTER — Other Ambulatory Visit: Payer: Self-pay

## 2024-05-04 DIAGNOSIS — I482 Chronic atrial fibrillation, unspecified: Secondary | ICD-10-CM

## 2024-05-04 DIAGNOSIS — R55 Syncope and collapse: Secondary | ICD-10-CM

## 2024-05-04 NOTE — Telephone Encounter (Signed)
 Called patient after receiving final report from ZIO monitor that showed 100 burden afib, and noted having 36 pauses.   Spoke with DOD (Dr.End) recommended to decrease Carvedilol , and EP referral.   After speaking with patient and patient wife, they were cutting the Rosuvastatin  in half, and continued taking Carvedilol  25 mg BID. I did speak again with DOD who recommended to start taking Carvedilol  12.5 mg BID, they both verbalized understanding. They will also start back taking whole tablet of Rosuvastatin  per last OV with PA who recommended to continue 20 mg.   EP referral was placed.

## 2024-05-08 ENCOUNTER — Telehealth: Payer: Self-pay | Admitting: Cardiovascular Disease

## 2024-05-08 NOTE — Telephone Encounter (Signed)
 Patient is calling to cancel his appointment with Dr. Marven Slimmer for 05/09/24. Patient stated he is too dizzy to drive, then proceeded to state that he would like Dr. Gollan to know that he "feels fine and has no chest pain". Patient would like someone to go over his results via telephone. Please advise.

## 2024-05-08 NOTE — Telephone Encounter (Addendum)
 Spoke with patient who cancelled  EP consult with Dr. Marven Slimmer due to being too dizzy to drive;  asked pt if he had anyone that could drive him to his appointment, but then pt stated "I really don't feel that bad, I'm tired of being doctored to death" ; pt asked if Dr. Marven Slimmer could just go over the heart monitor test with him over the phone; I explained that consults need to be in person and that it couldn't be done over the phone; encouraged him to reschedule due to preliminary report showing 32 pauses during the time he wore his heart monitor and that Dr. Marven Slimmer could discuss in depth when the pauses occurred; pt states he isn't sure he trusted the heart monitor; he said that he didn't want to keep the appointment and that if he began to feel worse he would call us ..

## 2024-05-09 ENCOUNTER — Institutional Professional Consult (permissible substitution): Admitting: Cardiology

## 2024-05-10 ENCOUNTER — Telehealth: Payer: Self-pay | Admitting: *Deleted

## 2024-05-10 NOTE — Telephone Encounter (Signed)
 Zio report received showing monitor was returned unused. Notified Dr. Colby Daub nurse Buelah Carmel RN.

## 2024-05-22 ENCOUNTER — Ambulatory Visit: Admitting: Cardiovascular Disease

## 2024-05-23 ENCOUNTER — Ambulatory Visit: Attending: Medical | Admitting: Medical

## 2024-05-23 ENCOUNTER — Ambulatory Visit (INDEPENDENT_AMBULATORY_CARE_PROVIDER_SITE_OTHER): Admitting: Cardiology

## 2024-05-23 ENCOUNTER — Encounter: Payer: Self-pay | Admitting: Medical

## 2024-05-23 VITALS — BP 144/72 | HR 60 | Ht 70.0 in | Wt 235.6 lb

## 2024-05-23 VITALS — BP 163/72 | HR 62 | Ht 70.0 in | Wt 235.6 lb

## 2024-05-23 DIAGNOSIS — R600 Localized edema: Secondary | ICD-10-CM

## 2024-05-23 DIAGNOSIS — I2510 Atherosclerotic heart disease of native coronary artery without angina pectoris: Secondary | ICD-10-CM

## 2024-05-23 DIAGNOSIS — Z79899 Other long term (current) drug therapy: Secondary | ICD-10-CM | POA: Diagnosis not present

## 2024-05-23 DIAGNOSIS — I4821 Permanent atrial fibrillation: Secondary | ICD-10-CM | POA: Diagnosis not present

## 2024-05-23 DIAGNOSIS — E782 Mixed hyperlipidemia: Secondary | ICD-10-CM | POA: Diagnosis not present

## 2024-05-23 DIAGNOSIS — R55 Syncope and collapse: Secondary | ICD-10-CM

## 2024-05-23 DIAGNOSIS — I5032 Chronic diastolic (congestive) heart failure: Secondary | ICD-10-CM | POA: Diagnosis not present

## 2024-05-23 NOTE — Progress Notes (Unsigned)
 Cardiology Office Note   Date:  05/23/2024  ID:  Odas, Ozer 06/09/1951, MRN [id number] PCP: Epifanio Alm SQUIBB, MD  [facility] Providers Cardiologist:  Evalene Lunger, MD Electrophysiologist:  OLE ONEIDA HOLTS, MD   History of Present Illness EVONTE PRESTAGE is a 73 y.o. male  with a hx of CAD s/p remote PCI and CABGx3 in 2021 with left atrial appendage clipping, TIA 2012 with bilateral occlusion of ICA monitored by Dr. Marea, permanent Afib, HLD, chronic back pain who presents for 1-2 month follow-up.    Cardiac cath in 2010 with PCI to the left circumflex. Per previous documentation cardiac cath at that time with 95% proximal circumflex, 60% mid LAD.  Per previous documentation transesophageal echo on 08/11/2010 with PFO, EF 50%, left atrium moderately dilated, moderate MR, mild to moderate TR.  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  He had previous cardioversion 01/2015.   Patient was seen 09/05/2020 in the office reporting worsening angina for the last month.  Patient was set up for cardiac catheterization.  Cardiac cath 09/12/2020 showed 95% stenosis ostial circumflex to proximal circumflex, 70% stenosis ostial LAD to proximal LAD, 60% mid LAD stenosis.  Patient ultimately underwent CABG x 3 and left atrial appendage clipping.  The patient was seen  01/2024 for syncope he described as gait instability. Found to have mlid dehydration, BNP 167, mild anemia. Heart monitor was ordered. Statin holiday was pursued and echo was ordered.   The patient was last seen 04/13/24 and denies any further syncope. He was to undergo carotid work-up for steal syndrome by vascular surgery. Lasix  was increased to 3 days for lower leg edema.  Today, the patient reports he is taking lasix  20mg  daily. He tried taking twice daily, but  urinated until 9pm. He denies chest pain and shortness of breath. Patient uses a Rollator. Still has swelling, but says it's improving.  Studies Reviewed EKG Interpretation Date/Time:  Wednesday May 23 2024 15:19:17 EDT Ventricular Rate:  60 PR Interval:    QRS Duration:  94 QT Interval:  422 QTC Calculation: 422 R Axis:   -19  Text Interpretation: Atrial fibrillation When compared with ECG of 13-Apr-2024 14:56, T wave inversion now evident in Inferior leads Confirmed by Franchester, Winslow Ederer ([id number]) on 05/23/2024 3:30:29 PM    Echo 04/2024  1. Left ventricular ejection fraction, by estimation, is >55%. The left  ventricle has normal function. Left ventricular endocardial border not  optimally defined to evaluate regional wall motion. There is mild left  ventricular hypertrophy. Left  ventricular diastolic parameters are indeterminate.   2. Pulmonary artery pressure is at least mildly elevated (RVSP 30-35 mmHg  plus central venous/right atrial pressure). Right ventricular systolic  function is moderately reduced. The right ventricular size is moderately  enlarged.   3. Left atrial size was severely dilated.   4. The mitral valve was not well visualized. Mild mitral valve  regurgitation. Moderate mitral annular calcification.   5. Tricuspid valve regurgitation is moderate to severe.   6. The aortic valve has an indeterminant number of cusps. There is mild  calcification of the aortic valve. There is moderate thickening of the  aortic valve. Aortic valve regurgitation is not visualized. Mild aortic  valve stenosis. Aortic valve mean  gradient measures 13.0 mmHg.    Echo 08/2020  1. Left ventricular ejection fraction, by  estimation, is 60 to 65%. The  left ventricle has normal function. The left ventricle has no regional  wall motion abnormalities. There is moderate left ventricular hypertrophy.  Left ventricular diastolic  parameters are indeterminate.   2. Right ventricular systolic  function is normal. The right ventricular  size is normal.   3. Left atrial size was severely dilated.   4. Right atrial size was mildly dilated.   5. The mitral valve is grossly normal. Mild mitral valve regurgitation.  No evidence of mitral stenosis. Moderate mitral annular calcification.   6. The aortic valve is abnormal. There is severe calcifcation of the  aortic valve. Aortic valve regurgitation is not visualized. Aortic valve  sclerosis/calcification is present, without any evidence of aortic  stenosis. Mean systolic gradient 9 mmHg.   Physical Exam VS:  BP (!) 144/72   Pulse 60   Ht 5' 10 (1.778 m)   Wt 235 lb 9.6 oz (106.9 kg)   SpO2 93%   BMI 33.81 kg/m        Wt Readings from Last 3 Encounters:  05/23/24 235 lb 9.6 oz (106.9 kg)  05/23/24 235 lb 9.6 oz (106.9 kg)  04/13/24 239 lb 12.8 oz (108.8 kg)    GEN: Well nourished, well developed in no acute distress NECK: No JVD; No carotid bruits CARDIAC: RRR, no murmurs, rubs, gallops RESPIRATORY:  Clear to auscultation without rales, wheezing or rhonchi  ABDOMEN: Soft, non-tender, non-distended EXTREMITIES:  2+ lower leg edema; No deformity   ASSESSMENT AND PLAN  Lower leg edema HFpEF Echo showed LVEF>55%, mild LVH, mildly elevated RVSP 30-27mmHG, moderately reduced RVSF, severely dilated LA, mild MR, mod to sever TR, mild AS. Patient has no h/o lung disease, may be from volume overload. Patient started lasix  20mg  daily at the last visit and volume status is improving but he he still volume up. He tried to increase lasix  but could not tolerate amount of urination. BMET today. We will continue lasix  20mg  daily at this point. Can look to add GDMT at follow-up.   Syncope Episode described as gait instability. He denies pre-syncope or syncope. We are still awaiting heart monitor. He is undergoing work-up for steal syndrome by V[facility].  Permanent Afib Continue Eliquis  5mg  BID for stroke ppx. Continue Coreg  for rate control.    CAD The patient denies anginal symptoms. Continue ASA, Coreg  and Zetia .   HLD LDL 29. Continue Crestor  20mg  daily.         Dispo:3 month follow-up  Signed, Raeonna Milo VEAR Fishman, PA-C

## 2024-05-23 NOTE — Patient Instructions (Signed)
 Medication Instructions:   Your physician recommends that you continue on your current medications as directed. Please refer to the Current Medication list given to you today.   *If you need a refill on your cardiac medications before your next appointment, please call your pharmacy*  Lab Work:  Your provider would like for you to have following labs drawn today BMET.    If you have labs (blood work) drawn today and your tests are completely normal, you will receive your results only by: MyChart Message (if you have MyChart) OR A paper copy in the mail If you have any lab test that is abnormal or we need to change your treatment, we will call you to review the results.  Testing/Procedures:  No test ordered today   Follow-Up: At [facility], you and your health needs are our priority.  As part of our continuing mission to provide you with exceptional heart care, our providers are all part of one team.  This team includes your primary Cardiologist (physician) and Advanced Practice Providers or APPs (Physician Assistants and Nurse Practitioners) who all work together to provide you with the care you need, when you need it.  Your next appointment:    3 month(s)  Provider:   You may see Timothy Gollan, MD or one of the following Advanced Practice Providers on your designated Care Team:    Cadence Rewey, PA-C

## 2024-05-23 NOTE — Progress Notes (Signed)
 Electrophysiology Office Note:    Date:  05/23/2024   ID:  Jeremy Reese, DOB 06/03/1951, MRN [id number]  [facility] Cardiologist:  Evalene Lunger, MD  [facility] Electrophysiologist:  OLE ONEIDA HOLTS, MD   Referring MD: Mady Bruckner, MD   Chief Complaint: Abnormal heart monitor  History of Present Illness:    [name] is a 73 year old man who I am seeing today for an evaluation of an abnormal heart monitor at the request of Jeremy Reese.  The patient saw Jeremy Apr 13, 2024.  He has a history of coronary disease with prior CABG/LAA in 2021C.  Also with TIA, permanent atrial fibrillation, hyperlipidemia and back pain.  He had a syncopal episode prior to a March 2025 appointment.  A heart monitor was ordered.  Because of severe back pain he is unable to lay on his left side or back for prolonged periods.  At the appointment with Jeremy he reported that his syncopal episode was secondary to dizziness and gait instability.  He was on Eliquis  for stroke prophylaxis.  Today he describes a syncopal episode.  He tells me that he was in his recliner in his living room.  He got up to go to the kitchen.  He had a short period where he felt lightheaded and then lost consciousness.  It took him quite a while to regain full consciousness.  He went to the emergency department where he was evaluated.  No clear cause of his syncope was identified and he was discharged.  He has not had another episode since leaving the hospital.     Their past medical, social and family history was reviewed.   ROS:   Please see the history of present illness.    All other systems reviewed and are negative.  EKGs/Labs/Other Studies Reviewed:    The following studies were reviewed today:  May 01, 2024 echo EF greater than 55% Severely dilated left atrium Mild MR Moderate MAC Moderate to severe tricuspid regurgitation  Apr 01, 2024 ZIO monitor personally reviewed Heart rate 32-1 74, average  60 3 nonsustained ventricular tachycardia episodes, longest 12 beats with an average rate of 114 bpm 100% A-fib burden 36 pauses, longest 4.1 seconds.  Pauses were nocturnal.  No symptom triggered episodes corresponded to pauses.      Physical Exam:    VS:  BP (!) 163/72   Pulse 62   Ht 5' 10 (1.778 m)   Wt 235 lb 9.6 oz (106.9 kg)   SpO2 95%   BMI 33.81 kg/m     Wt Readings from Last 3 Encounters:  05/23/24 235 lb 9.6 oz (106.9 kg)  05/23/24 235 lb 9.6 oz (106.9 kg)  04/13/24 239 lb 12.8 oz (108.8 kg)     GEN: no distress.  Elderly. CARD: Irregularly irregular, No MRG RESP: No IWOB. CTAB.        ASSESSMENT AND PLAN:    1. Permanent atrial fibrillation (HCC)   2. Syncope, unspecified syncope type     #Syncope Unclear cause.  I suspect he had orthostatic hypotension leading to syncope event this event occurred after standing up from a seated position.  I do not think it was secondary to pauses given his pauses or nocturnal and are maximum 4.1 seconds on the ZIO monitor.  I have reviewed the pathophysiology of syncope during this visit.  I reviewed the likely causes of his syncope.  I have encouraged him to take transitioned in position slowly.   #Permanent atrial fibrillation  Continue Eliquis  for stroke prophylaxis  Follow up as needed with EP.     Signed, Ole DASEN. Cindie, MD, [facility], [facility] 05/23/2024 5:56 PM    Electrophysiology [facility]

## 2024-05-23 NOTE — Patient Instructions (Signed)

## 2024-05-24 ENCOUNTER — Ambulatory Visit: Payer: Self-pay | Admitting: Medical

## 2024-05-24 LAB — BASIC METABOLIC PANEL WITH GFR
BUN/Creatinine Ratio: 18 (ref 10–24)
BUN: 25 mg/dL (ref 8–27)
CO2: 20 mmol/L (ref 20–29)
Calcium: 9.8 mg/dL (ref 8.6–10.2)
Chloride: 102 mmol/L (ref 96–106)
Creatinine, Ser: 1.38 mg/dL — ABNORMAL HIGH (ref 0.76–1.27)
Glucose: 92 mg/dL (ref 70–99)
Potassium: 5.5 mmol/L — ABNORMAL HIGH (ref 3.5–5.2)
Sodium: 140 mmol/L (ref 134–144)
eGFR: 54 mL/min/{1.73_m2} — ABNORMAL LOW (ref >59)

## 2024-05-29 ENCOUNTER — Other Ambulatory Visit (INDEPENDENT_AMBULATORY_CARE_PROVIDER_SITE_OTHER): Payer: Self-pay | Admitting: Nurse Practitioner

## 2024-05-29 DIAGNOSIS — I6523 Occlusion and stenosis of bilateral carotid arteries: Secondary | ICD-10-CM

## 2024-05-31 ENCOUNTER — Encounter (INDEPENDENT_AMBULATORY_CARE_PROVIDER_SITE_OTHER)

## 2024-05-31 ENCOUNTER — Ambulatory Visit (INDEPENDENT_AMBULATORY_CARE_PROVIDER_SITE_OTHER): Admitting: Nurse Practitioner

## 2024-06-15 ENCOUNTER — Encounter: Payer: Self-pay | Admitting: Cardiology

## 2024-06-18 ENCOUNTER — Telehealth: Payer: Self-pay | Admitting: Cardiovascular Disease

## 2024-06-18 NOTE — Telephone Encounter (Signed)
 Patient stated he received another Zio T heart monitor and wants a call back to discuss next steps.

## 2024-06-19 ENCOUNTER — Ambulatory Visit: Payer: Self-pay | Admitting: *Deleted

## 2024-06-19 ENCOUNTER — Other Ambulatory Visit (INDEPENDENT_AMBULATORY_CARE_PROVIDER_SITE_OTHER): Payer: Self-pay

## 2024-06-19 ENCOUNTER — Other Ambulatory Visit: Payer: Self-pay | Admitting: *Deleted

## 2024-06-19 DIAGNOSIS — I4821 Permanent atrial fibrillation: Secondary | ICD-10-CM

## 2024-06-19 NOTE — Telephone Encounter (Signed)
 Spoke with patient and reviewed that we were trying to get his monitor uploaded to our system and so he may receive heart monitors and advised to mail them back.

## 2024-06-19 NOTE — Telephone Encounter (Signed)
 Report reviewed at patients appointment.

## 2024-06-20 ENCOUNTER — Ambulatory Visit: Payer: Self-pay | Admitting: Internal Medicine

## 2024-06-20 DIAGNOSIS — I4821 Permanent atrial fibrillation: Secondary | ICD-10-CM | POA: Diagnosis not present

## 2024-06-20 NOTE — Progress Notes (Signed)
 Erroneous encounter

## 2024-06-27 DIAGNOSIS — I4821 Permanent atrial fibrillation: Secondary | ICD-10-CM | POA: Diagnosis not present

## 2024-07-03 ENCOUNTER — Encounter (INDEPENDENT_AMBULATORY_CARE_PROVIDER_SITE_OTHER): Payer: Self-pay | Admitting: Vascular Surgery

## 2024-07-03 ENCOUNTER — Ambulatory Visit (INDEPENDENT_AMBULATORY_CARE_PROVIDER_SITE_OTHER)

## 2024-07-03 ENCOUNTER — Ambulatory Visit (INDEPENDENT_AMBULATORY_CARE_PROVIDER_SITE_OTHER): Admitting: Vascular Surgery

## 2024-07-03 VITALS — BP 180/81 | HR 51 | Resp 16 | Ht 70.0 in | Wt 237.4 lb

## 2024-07-03 DIAGNOSIS — E785 Hyperlipidemia, unspecified: Secondary | ICD-10-CM

## 2024-07-03 DIAGNOSIS — I6523 Occlusion and stenosis of bilateral carotid arteries: Secondary | ICD-10-CM

## 2024-07-03 DIAGNOSIS — I480 Paroxysmal atrial fibrillation: Secondary | ICD-10-CM

## 2024-07-03 DIAGNOSIS — I10 Essential (primary) hypertension: Secondary | ICD-10-CM | POA: Diagnosis not present

## 2024-07-03 NOTE — Assessment & Plan Note (Signed)
 lipid control important in reducing the progression of atherosclerotic disease. Continue statin therapy

## 2024-07-03 NOTE — Assessment & Plan Note (Signed)
 blood pressure control important in reducing the progression of atherosclerotic disease. On appropriate oral medications.

## 2024-07-03 NOTE — Progress Notes (Signed)
 MRN : [id number]  Jeremy Reese is a 73 y.o. (Nov 06, 1951) male who presents with chief complaint of  Chief Complaint  Patient presents with   Follow-up    LS 03/13/24 Carotid + Consult Bilateral Carotid Stenosis ref.[place]  .  History of Present Illness: Patient returns today in follow up of bilateral carotid artery occlusions.  He was seen by our office earlier this year.  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  His ultrasound today was again consistent with bilateral carotid artery occlusions.  Both vertebral arteries are fairly normal-appearing with antegrade flow.  He seems to be doing reasonably well.  He was recently prescribed physical therapy and wanted to make sure that was okay from a vascular surgery standpoint.  He remains on Eliquis  and aspirin  as well as Crestor .  Current Outpatient Medications  Medication Sig Dispense Refill   amLODipine -olmesartan  (AZOR ) 5-20 MG tablet TAKE 1 TABLET BY MOUTH DAILY 90 tablet 0   apixaban  (ELIQUIS ) 5 MG TABS tablet Take 1 tablet (5 mg total) by mouth 2 (two) times daily. 60 tablet 0   aspirin  EC 81 MG tablet Take 1 tablet (81 mg total) by mouth daily. 90 tablet 3   buPROPion  (WELLBUTRIN  XL) 300 MG 24 hr tablet Take 1 tablet (300 mg total) by mouth daily. 30 tablet 1   calcipotriene  (DOVONOX) 0.005 % cream Apply to aa's psoriasis QD PRN flares. 60 g 0   carvedilol  (COREG ) 25 MG tablet Take 0.5 tablets (12.5 mg total) by mouth 2 (two) times daily with a meal. 60 tablet 3   ezetimibe  (ZETIA ) 10 MG tablet TAKE ONE (1) TABLET EACH DAY 30 tablet 3   furosemide  (LASIX ) 20 MG tablet Take 1 tablet (20 mg total) by mouth 3 (three) times a week. 36 tablet 3   gabapentin  (NEURONTIN ) 300 MG capsule Take 2 capsules (600 mg total) by mouth 3 (three) times daily. (Patient taking differently: Take 300 mg by mouth 3 (three) times daily.) 180 capsule 1    LORazepam  (ATIVAN ) 1 MG tablet Take 1 tablet (1 mg total) by mouth 2 (two) times daily as needed for anxiety or sleep. Script must last 30 days from fill date. 60 tablet 1   mometasone  (ELOCON ) 0.1 % cream Apply to affected skin once a day 5 days a week 45 g 3   nitroGLYCERIN  (NITROSTAT ) 0.4 MG SL tablet Place 1 tablet (0.4 mg total) under the tongue every 5 (five) minutes as needed for chest pain. 25 tablet 3   polyethylene glycol (MIRALAX  / GLYCOLAX ) 17 g packet Take 17 g by mouth daily.     potassium chloride  (KLOR-CON ) 10 MEQ tablet TAKE ONE TABLET BY MOUTH EVERY DAY AS NEEDED **TAKE WHEN YOU TAKE YOUR FUROSEMIDE  (FLUID PILL)** 90 tablet 2   rosuvastatin  (CRESTOR ) 20 MG tablet Take 1 tablet (20 mg total) by mouth daily. 90 tablet 3   sildenafil  (REVATIO ) 20 MG tablet Take 1 to 5 tablets 1 hour prior to intercourse. 15 tablet 0   Sodium Sulfate-Mag Sulfate-KCl (SUTAB ) 1479-225-188 MG TABS Use as directed for colonoscopy. MANUFACTURER CODES!! BIN: [id number] PCN: CN GROUP: [id number] MEMBER ID: [id number];MLW AS SECONDARY INSURANCE ;NO PRIOR AUTHORIZATION 24 tablet 0   traMADol  (ULTRAM ) 50 MG tablet Take by mouth at bedtime.     No current facility-administered medications for this visit.    Past Medical History:  Diagnosis Date   Atrial  flutter, paroxysmal (HCC)    Carotid artery disease without cerebral infarction [facility])    Coronary artery disease 2011   Flatback syndrome of thoracolumbar region    H/O calcium  pyrophosphate deposition disease (CPPD)    History of TIA (transient ischemic attack)    Hyperlipidemia    Paroxysmal A-fib (HCC)    Spinal stenosis of lumbar region without neurogenic claudication     Past Surgical History:  Procedure Laterality Date   BILATERAL CARPAL TUNNEL RELEASE     CARDIAC CATHETERIZATION     CIRCUMCISION, NON-NEWBORN     CLIPPING OF ATRIAL APPENDAGE N/A 09/18/2020   Procedure: CLIPPING OF ATRIAL APPENDAGE USING 45 ATRICURE LAA  EXCLUSION SYSTEM;  Surgeon:  Fleeta Hanford Coy, MD;  Location: [facility] OR;  Service: Open Heart Surgery;  Laterality: N/A;   CORONARY ANGIOPLASTY WITH STENT PLACEMENT  2011   CORONARY ARTERY BYPASS GRAFT N/A 09/18/2020   Procedure: CORONARY ARTERY BYPASS GRAFTING (CABG)X 3, ON PUMP, USING LEFT INTERAL MAMMARY ARTERY AND ENDOSCOPICALLY HARVESTED RIGHT GREATER SAPHENOUS VEIN. LIMA TO LAD, SVG TO OM, SVG TO PD;  Surgeon: Fleeta Hanford Coy, MD;  Location: [facility] OR;  Service: Open Heart Surgery;  Laterality: N/A;   ENDOVEIN HARVEST OF GREATER SAPHENOUS VEIN Right 09/18/2020   Procedure: ENDOVEIN HARVEST OF GREATER SAPHENOUS VEIN;  Surgeon: Fleeta Hanford Coy, MD;  Location: [facility] OR;  Service: Open Heart Surgery;  Laterality: Right;   LEFT HEART CATH AND CORONARY ANGIOGRAPHY Left 09/12/2020   Procedure: LEFT HEART CATH AND CORONARY ANGIOGRAPHY;  Surgeon: Perla Evalene PARAS, MD;  Location: [facility] INVASIVE CV LAB;  Service: Cardiovascular;  Laterality: Left;   TEE WITHOUT CARDIOVERSION N/A 09/18/2020   Procedure: TRANSESOPHAGEAL ECHOCARDIOGRAM (TEE);  Surgeon: Fleeta Hanford, Coy, MD;  Location: [facility] OR;  Service: Open Heart Surgery;  Laterality: N/A;     Social History   Tobacco Use   Smoking status: Former    Current packs/day: 0.00    Average packs/day: 0.3 packs/day for 35.0 years (8.8 ttl pk-yrs)    Types: Cigarettes    Start date: 11/29/1958    Quit date: 11/29/1993    Years since quitting: 30.6   Smokeless tobacco: Never  Vaping Use   Vaping status: Never Used  Substance Use Topics   Alcohol use: Yes    Alcohol/week: 1.0 standard drink of alcohol    Types: 1 Cans of beer per week   Drug use: No       Family History  Problem Relation Age of Onset   Heart disease Mother 54       CABG x 3    Hypertension Mother    Hypertension Father    Heart disease Father    Lung cancer Father    Kidney disease Other    Colon cancer Paternal Grandmother    Liver disease Neg Hx    Stomach cancer Neg Hx    Esophageal cancer Neg Hx     Pancreatic cancer Neg Hx      Allergies  Allergen Reactions   Propafenone Anxiety   Codeine Nausea Only   Penicillins Rash     REVIEW OF SYSTEMS (Negative unless checked)  Constitutional: [] Weight loss  [] Fever  [] Chills Cardiac: [] Chest pain   [] Chest pressure   [x] Palpitations   [] Shortness of breath when laying flat   [] Shortness of breath at rest   [x] Shortness of breath with exertion. Vascular:  [] Pain in legs with walking   [] Pain in legs at rest   [] Pain in legs when  laying flat   [] Claudication   [] Pain in feet when walking  [] Pain in feet at rest  [] Pain in feet when laying flat   [] History of DVT   [] Phlebitis   [] Swelling in legs   [] Varicose veins   [] Non-healing ulcers Pulmonary:   [] Uses home oxygen   [] Productive cough   [] Hemoptysis   [] Wheeze  [] COPD   [] Asthma Neurologic:  [] Dizziness  [] Blackouts   [] Seizures   [] History of stroke   [] History of TIA  [] Aphasia   [] Temporary blindness   [] Dysphagia   [] Weakness or numbness in arms   [x] Weakness or numbness in legs Musculoskeletal:  [] Arthritis   [] Joint swelling   [x] Joint pain   [x] Low back pain Hematologic:  [] Easy bruising  [] Easy bleeding   [] Hypercoagulable state   [] Anemic   Gastrointestinal:  [] Blood in stool   [] Vomiting blood  [] Gastroesophageal reflux/heartburn   [] Abdominal pain Genitourinary:  [] Chronic kidney disease   [] Difficult urination  [] Frequent urination  [] Burning with urination   [] Hematuria Skin:  [] Rashes   [] Ulcers   [] Wounds Psychological:  [] History of anxiety   []  History of major depression.  Physical Examination  BP (!) 180/81 (BP Location: Right Arm, Patient Position: Sitting, Cuff Size: Normal)   Pulse (!) 51   Resp 16   Ht 5' 10 (1.778 m)   Wt 237 lb 6.4 oz (107.7 kg)   BMI 34.06 kg/m  Gen:  WD/WN, NAD Head: [place]/AT, No temporalis wasting. Ear/Nose/Throat: Hearing grossly intact, nares w/o erythema or drainage Eyes: Conjunctiva clear. Sclera non-icteric Neck: Supple.  Trachea  midline Pulmonary:  Good air movement, no use of accessory muscles.  Cardiac: RRR, no JVD Vascular:  Vessel Right Left  Radial Palpable Palpable           Musculoskeletal: M/S 5/5 throughout.  No deformity or atrophy.  Walks with a walker with significant kyphosis.  Mild lower extremity edema. Neurologic: Sensation grossly intact in extremities.  Symmetrical.  Speech is fluent.  Psychiatric: Judgment intact, Mood & affect appropriate for pt's clinical situation. Dermatologic: No rashes or ulcers noted.  No cellulitis or open wounds.      Labs Recent Results (from the past 2160 hours)  ECHOCARDIOGRAM COMPLETE     Status: None   Collection Time: 04/27/24  2:35 PM  Result Value Ref Range   AR max vel 0.94 cm2   AV Peak grad 22.9 mmHg   Ao pk vel 2.40 m/s   S' Lateral 2.70 cm   AV Area VTI 0.86 cm2   AV Mean grad 13.0 mmHg   Single Plane A4C EF 57.0 %   AV Area mean vel 0.94 cm2   Est EF >55   Basic metabolic panel with GFR     Status: Abnormal   Collection Time: 05/23/24  3:52 PM  Result Value Ref Range   Glucose 92 70 - 99 mg/dL   BUN 25 8 - 27 mg/dL   Creatinine, Ser 8.61 (H) 0.76 - 1.27 mg/dL   eGFR 54 (L) >40 fO/fpw/8.26   BUN/Creatinine Ratio 18 10 - 24   Sodium 140 134 - 144 mmol/L   Potassium 5.5 (H) 3.5 - 5.2 mmol/L   Chloride 102 96 - 106 mmol/L   CO2 20 20 - 29 mmol/L   Calcium  9.8 8.6 - 10.2 mg/dL    Radiology LONG TERM MONITOR-LIVE TELEMETRY (3-14 DAYS) Result Date: 06/20/2024   Geoffry report is scanned separately under media tab (date 03/18/2024).   The patient was  monitored for 14 days.   The predominant rhythm was atrial fibrillation (100% burden) with an average ventricular rate of 60 bpm (range 32-174 bpm).   There were rare PVC's.   Three episodes of nonsustained ventricular tachycardia occurred, lasting up to 12 beats with a maximum rate of 174 bpm.   36 pauses of 3 seconds or longer were observed, lasting up to 4.1 seconds.   Patient triggered events  correspond to atrial fibrillation (ventricular rate 50-82 bpm). Persistent atrial fibrillation with pauses of up to 4.1 seconds.  Three episodes of NSVT also noted, as detailed above.    Assessment/Plan  Essential hypertension blood pressure control important in reducing the progression of atherosclerotic disease. On appropriate oral medications.   Paroxysmal A-fib (HCC) On anticoagulation  Carotid stenosis His ultrasound today was again consistent with bilateral carotid artery occlusions.  Both vertebral arteries are fairly normal-appearing with antegrade flow.  We again discussed that really for chronic total occlusions of the carotid artery, medical management alone is generally recommended.  We will follow this on an annual basis with duplex.  Continue his current medical regimen.  Hyperlipidemia lipid control important in reducing the progression of atherosclerotic disease. Continue statin therapy    Selinda Gu, MD  07/03/2024 4:54 PM    This note was created with Dragon medical transcription system.  Any errors from dictation are purely unintentional

## 2024-07-03 NOTE — Assessment & Plan Note (Signed)
On anticoagulation 

## 2024-07-03 NOTE — Assessment & Plan Note (Signed)
 His ultrasound today was again consistent with bilateral carotid artery occlusions.  Both vertebral arteries are fairly normal-appearing with antegrade flow.  We again discussed that really for chronic total occlusions of the carotid artery, medical management alone is generally recommended.  We will follow this on an annual basis with duplex.  Continue his current medical regimen.

## 2024-07-09 ENCOUNTER — Other Ambulatory Visit: Payer: Self-pay | Admitting: Medical

## 2024-07-09 NOTE — Telephone Encounter (Signed)
 Lasix 20mg  daily

## 2024-07-12 ENCOUNTER — Other Ambulatory Visit: Payer: Self-pay | Admitting: Cardiovascular Disease

## 2024-07-17 ENCOUNTER — Ambulatory Visit: Admitting: Cardiovascular Disease

## 2024-08-20 NOTE — Progress Notes (Unsigned)
 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 Marea,  atrial fibrillation dating back to 2011  severe back pain not a surgical candidate per the surgeons given his severe bilateral carotid disease Old CVA on CT scan head in 2015 09/18/2020 s/p CABG x 3  Diabetes type 2 who presents for follow-up of his permanent atrial fibrillation, PAD, CAD with CABG for left main disease  Last seen by myself in clinic 04/2023 Seen by EP June 2025 Episode of syncope, no clear cause Suspected orthostasis Zio monitor with nocturnal pauses  Reports having chronic back pain, immobility Prior MRI with multilevel degenerative  arthritis Sedentary at home Chronic swelling right leg, minimal on the left Unable to wear compression hose, they are too tight  Echo 04/27/24 reviewed on today's visit EF >55 Severe LA size Moderate to severe TR  Taking lasix  40 mg daily  Lab work reviewed A1c 6.7 Total cholesterol 92 LDL 29 Creatinine 1.3, hemoglobin 12.4  Blood pressure low today 108 systolic  In the donut hole, Eliquis  is expensive  EKG personally reviewed by myself on todays visit EKG Interpretation Date/Time:  Tuesday August 21 2024 15:25:01 EDT Ventricular Rate:  68 PR Interval:    QRS Duration:  96 QT Interval:  420 QTC Calculation: 446 R Axis:   -19  Text Interpretation: Atrial fibrillation with premature ventricular or aberrantly conducted complexes Minimal voltage criteria for LVH, may be normal variant ( R in aVL ) When compared with ECG of 23-May-2024 15:19, No significant change  was found Confirmed by Perla Lye [phone]) on 08/21/2024 3:31:53 PM    Other past medical history reviewed Admitted to the hospital with suicidal ideation November 10, 2020 Inpatient stay in behavioral health Worsening depression  Past cardiac history reviewed cardiac catheterization October 2021 Images very concerning for critical ostial/proximal left circumflex disease estimated 95% or greater LAD with moderate to severe ostial/proximal disease though difficult to visualize, and 60% mid disease  Cath 09/12/2020 Ost Cx to Prox Cx lesion is 95% stenosed. Ost LAD to Prox LAD lesion is 70% stenosed. Mid LAD lesion is 60% stenosed.  worsening anginal symptoms over the past several weeks with clear escalation of his isosorbide  to relieve his pressure Critical ostial left circumflex disease of a large vessel (stent stenosis, heavily calcified), nondominant right, also with disease of the LAD Evaluated for CABG.   Echo: EF 60%, calcified aortic valve  operating room details 09/18/2020.  He underwent CABG x 3 utilizing LIMA to LAD, SVG to OM1, and SVG to PDA.  He had clipping of his LA appendage with a 45 mm Atricure Clip.  In the past he has tried higher dose pravastatin  80 mg and had side effects including fatigue, weakness, muscle ache. He is able to tolerate 40 mg daily.  Carotid ultrasound reviewed with him from 01/14/2015 showing bilateral internal carotid occlusion, no significant change from ultrasound dated June 2015   Prior studies show cardioversion 02/25/2015 Transesophageal echo 08/11/2010 with PFO noted, ejection fraction 50%, left atrium moderately dilated, moderate MR, mild to  moderate TR    PMH:   has a past medical history of Atrial flutter, paroxysmal (HCC), Carotid artery disease without cerebral infarction, Coronary artery disease (2011), Flatback syndrome of thoracolumbar region, H/O calcium  pyrophosphate deposition disease (CPPD), History of TIA (transient ischemic  attack), Hyperlipidemia, Paroxysmal A-fib (HCC), and Spinal stenosis of lumbar region without neurogenic claudication.  PSH:    Past Surgical History:  Procedure Laterality Date   BILATERAL CARPAL TUNNEL RELEASE     CARDIAC CATHETERIZATION     CIRCUMCISION, NON-NEWBORN     CLIPPING OF ATRIAL APPENDAGE N/A 09/18/2020   Procedure: CLIPPING OF ATRIAL APPENDAGE USING 45 ATRICURE LAA  EXCLUSION SYSTEM;  Surgeon: Fleeta Hanford Coy, MD;  Location: [facility] OR;  Service: Open Heart Surgery;  Laterality: N/A;   CORONARY ANGIOPLASTY WITH STENT PLACEMENT  2011   CORONARY ARTERY BYPASS GRAFT N/A 09/18/2020   Procedure: CORONARY ARTERY BYPASS GRAFTING (CABG)X 3, ON PUMP, USING LEFT INTERAL MAMMARY ARTERY AND ENDOSCOPICALLY HARVESTED RIGHT GREATER SAPHENOUS VEIN. LIMA TO LAD, SVG TO OM, SVG TO PD;  Surgeon: Fleeta Hanford Coy, MD;  Location: [facility] OR;  Service: Open Heart Surgery;  Laterality: N/A;   ENDOVEIN HARVEST OF GREATER SAPHENOUS VEIN Right 09/18/2020   Procedure: ENDOVEIN HARVEST OF GREATER SAPHENOUS VEIN;  Surgeon: Fleeta Hanford Coy, MD;  Location: [facility] OR;  Service: Open Heart Surgery;  Laterality: Right;   LEFT HEART CATH AND CORONARY ANGIOGRAPHY Left 09/12/2020   Procedure: LEFT HEART CATH AND CORONARY ANGIOGRAPHY;  Surgeon: Perla Evalene PARAS, MD;  Location: [facility] INVASIVE CV LAB;  Service: Cardiovascular;  Laterality: Left;   TEE WITHOUT CARDIOVERSION N/A 09/18/2020   Procedure: TRANSESOPHAGEAL ECHOCARDIOGRAM (TEE);  Surgeon: Fleeta Hanford, Coy, MD;  Location: [facility] OR;  Service: Open Heart Surgery;  Laterality: N/A;    Current Outpatient Medications  Medication Sig Dispense Refill   amLODipine -olmesartan  (AZOR ) 5-20 MG tablet TAKE 1 TABLET BY MOUTH DAILY 90 tablet 0   apixaban  (ELIQUIS ) 5 MG TABS tablet Take 1 tablet (5 mg total) by mouth 2 (two) times daily. 60 tablet 0   aspirin  EC 81 MG tablet Take 1 tablet (81 mg total) by mouth daily. 90 tablet 3   buPROPion  (WELLBUTRIN  XL) 300 MG 24 hr tablet Take 1  tablet (300 mg total) by mouth daily. 30 tablet 1   calcipotriene  (DOVONOX) 0.005 % cream Apply to aa's psoriasis QD PRN flares. 60 g 0   carvedilol  (COREG ) 25 MG tablet Take 0.5 tablets (12.5 mg total) by mouth 2 (two) times daily with a meal. 60 tablet 3   ezetimibe  (ZETIA ) 10 MG tablet TAKE ONE (1) TABLET EACH DAY 30 tablet 3   furosemide  (LASIX ) 20 MG tablet TAKE 1 TABLET BY MOUTH THREE TIMES A WEEK AS DIRECTED. 36 tablet 3   gabapentin  (NEURONTIN ) 300 MG capsule Take 2 capsules (600 mg total) by mouth 3 (three) times daily. (Patient taking differently: Take 300 mg by mouth 3 (three) times daily.) 180 capsule 1   LORazepam  (ATIVAN ) 1 MG tablet Take 1 tablet (1 mg total) by mouth 2 (two) times daily as needed for anxiety or sleep. Script must last 30 days from fill date. 60 tablet 1   mometasone  (ELOCON ) 0.1 % cream Apply to affected skin once a day 5 days a week 45 g 3   nitroGLYCERIN  (NITROSTAT ) 0.4 MG SL tablet Place 1 tablet (0.4 mg total) under the tongue every 5 (five) minutes as needed for chest pain. 25 tablet 3   polyethylene glycol (MIRALAX  /  GLYCOLAX ) 17 g packet Take 17 g by mouth daily.     potassium chloride  (KLOR-CON ) 10 MEQ tablet TAKE ONE TABLET BY MOUTH EVERY DAY AS NEEDED **TAKE WHEN YOU TAKE YOUR FUROSEMIDE  (FLUID PILL)** 90 tablet 2   rosuvastatin  (CRESTOR ) 20 MG tablet Take 1 tablet (20 mg total) by mouth daily. 90 tablet 3   sildenafil  (REVATIO ) 20 MG tablet Take 1 to 5 tablets 1 hour prior to intercourse. 15 tablet 0   Sodium Sulfate-Mag Sulfate-KCl (SUTAB ) 1479-225-188 MG TABS Use as directed for colonoscopy. MANUFACTURER CODES!! BIN: [id number] PCN: CN GROUP: [id number] MEMBER ID: [id number];MLW AS SECONDARY INSURANCE ;NO PRIOR AUTHORIZATION 24 tablet 0   traMADol  (ULTRAM ) 50 MG tablet Take by mouth at bedtime.     No current facility-administered medications for this visit.    Allergies:   Propafenone, Codeine, and Penicillins   Social History:  The patient  reports  that he quit smoking about 30 years ago. His smoking use included cigarettes. He started smoking about 65 years ago. He has a 8.8 pack-year smoking history. He has never used smokeless tobacco. He reports current alcohol use of about 1.0 standard drink of alcohol per week. He reports that he does not use drugs.   Family History:   family history includes Colon cancer in his paternal grandmother; Heart disease in his father; Heart disease (age of onset: 65) in his mother; Hypertension in his father and mother; Kidney disease in an other family member; Lung cancer in his father.    Review of Systems: Review of Systems  Constitutional: Negative.   HENT: Negative.    Respiratory: Negative.    Cardiovascular: Negative.   Gastrointestinal: Negative.   Musculoskeletal: Negative.   Neurological: Negative.   All other systems reviewed and are negative.   PHYSICAL EXAM: VS:  BP 108/72 (BP Location: Left Arm, Patient Position: Sitting, Cuff Size: Normal)   Pulse 68   Ht 5' 10 (1.778 m)   Wt 240 lb (108.9 kg)   SpO2 97%   BMI 34.44 kg/m  , BMI Body mass index is 34.44 kg/m. Constitutional:  oriented to person, place, and time. No distress.  HENT:  Head: Grossly normal Eyes:  no discharge. No scleral icterus.  Neck: No JVD, no carotid bruits  Cardiovascular: Irregularly irregular, no murmurs appreciated, trace lower extremity edema on the right with brawny discoloration, healing blisters Pulmonary/Chest: Clear to auscultation bilaterally, no wheezes or rails Abdominal: Soft.  no distension.  no tenderness.  Musculoskeletal: Normal range of motion Neurological:  normal muscle tone. Coordination normal. No atrophy Skin: Skin warm and dry Psychiatric: normal affect, pleasant  Recent Labs: 01/12/2024: B Natriuretic Peptide 166.9; Hemoglobin 11.9; Platelets 92 05/23/2024: BUN 25; Creatinine, Ser 1.38; Potassium 5.5; Sodium 140    Lipid Panel Lab Results  Component Value Date   CHOL 108  09/05/2020   HDL 44 09/05/2020   LDLCALC 48 09/05/2020   TRIG 76 09/05/2020      Wt Readings from Last 3 Encounters:  08/21/24 240 lb (108.9 kg)  07/03/24 237 lb 6.4 oz (107.7 kg)  05/23/24 235 lb 9.6 oz (106.9 kg)     ASSESSMENT AND PLAN:  Acute diastolic CHF (congestive heart failure) (HCC)  Reports he is taking Lasix  40 daily unless he has a meeting Unable to fit compression hose - Recommend he try Velcro Ace wrap's, leg elevation - BMP today - Recommend he hold the amlodipine  (given the potential for worsening leg swelling), continue olmesartan  20  Bilateral  carotid artery stenosis  Occluded carotids bilaterally Followed by vein and vascular Cholesterol at goal  Coronary artery disease involving native coronary artery of native heart with chronic stable angina pectoris - CABG 2021 Non-smoker, nondiabetic, cholesterol at goal Denies anginal symptoms  Chronic atrial fibrillation (HCC)  On anticoagulation, rate well controlled Tolerating Eliquis .  Rate well controlled off beta-blocker -Unclear if he is a candidate for patient assistance Could try prescription of Pradaxa  through GoodRx.com ($60) to see if this is cheaper than Eliquis  as he is in the donut hole, reports his cost is $147 per month for Eliquis   Mixed hyperlipidemia - Plan: EKG 12-Lead  Crestor  40 mg daily with Zetia  Cholesterol at goal  Morbid obesity We have encouraged continued exercise, careful diet management in an effort to lose weight.  Essential hypertension Changes Azor  as above, hold amlodipine  given leg swelling   Orders Placed This Encounter  Procedures   EKG 12-Lead     Signed, Velinda Lunger, M.D., Ph.D. 08/21/2024  [facility] [place], [place] [phone]

## 2024-08-21 ENCOUNTER — Encounter: Payer: Self-pay | Admitting: Cardiovascular Disease

## 2024-08-21 ENCOUNTER — Ambulatory Visit: Attending: Cardiovascular Disease | Admitting: Cardiovascular Disease

## 2024-08-21 VITALS — BP 108/72 | HR 68 | Ht 70.0 in | Wt 240.0 lb

## 2024-08-21 DIAGNOSIS — R55 Syncope and collapse: Secondary | ICD-10-CM | POA: Diagnosis not present

## 2024-08-21 DIAGNOSIS — Z79899 Other long term (current) drug therapy: Secondary | ICD-10-CM | POA: Diagnosis not present

## 2024-08-21 DIAGNOSIS — I4821 Permanent atrial fibrillation: Secondary | ICD-10-CM

## 2024-08-21 DIAGNOSIS — R600 Localized edema: Secondary | ICD-10-CM

## 2024-08-21 DIAGNOSIS — I2510 Atherosclerotic heart disease of native coronary artery without angina pectoris: Secondary | ICD-10-CM

## 2024-08-21 DIAGNOSIS — E782 Mixed hyperlipidemia: Secondary | ICD-10-CM | POA: Diagnosis not present

## 2024-08-21 DIAGNOSIS — I5032 Chronic diastolic (congestive) heart failure: Secondary | ICD-10-CM | POA: Diagnosis not present

## 2024-08-21 MED ORDER — DABIGATRAN ETEXILATE MESYLATE 150 MG PO CAPS: 150.0000 mg | ORAL_CAPSULE | Freq: Two times a day (BID) | ORAL | 0 refills | Status: DC

## 2024-08-21 MED ORDER — OLMESARTAN MEDOXOMIL 20 MG PO TABS: 20.0000 mg | ORAL_TABLET | Freq: Every day | ORAL | 3 refills | Status: AC

## 2024-08-21 NOTE — Patient Instructions (Addendum)
 Medication Instructions:   Please stop the azor  (amlodipine  can sometimes cause leg swelling)  Start olmesartan  20 mg daily  Please research Pradaxa  150 MG twice daily with good Rx.   If you need a refill on your cardiac medications before your next appointment, please call your pharmacy.   Lab work: [employer] today  Testing/Procedures: No new testing needed  Follow-Up: At [facility], you and your health needs are our priority.  As part of our continuing mission to provide you with exceptional heart care, we have created designated Provider Care Teams.  These Care Teams include your primary Cardiologist (physician) and Advanced Practice Providers (APPs -  Physician Assistants and Nurse Practitioners) who all work together to provide you with the care you need, when you need it.  You will need a follow up appointment in 6 months  Providers on your designated Care Team:   Lonni Meager, NP Bernardino Bring, PA-C Cadence [place], [place]  COVID-19 Vaccine Information can be found at: [email or web address] For questions related to vaccine distribution or appointments, please email [email or web address] or call [phone].

## 2024-08-22 ENCOUNTER — Other Ambulatory Visit: Payer: Self-pay | Admitting: Emergency Medicine

## 2024-08-22 ENCOUNTER — Telehealth: Payer: Self-pay | Admitting: Pharmacy Technician

## 2024-08-22 ENCOUNTER — Other Ambulatory Visit (HOSPITAL_COMMUNITY): Payer: Self-pay

## 2024-08-22 LAB — BASIC METABOLIC PANEL WITH GFR
BUN/Creatinine Ratio: 19 (ref 10–24)
BUN: 28 mg/dL — ABNORMAL HIGH (ref 8–27)
CO2: 23 mmol/L (ref 20–29)
Calcium: 9.4 mg/dL (ref 8.6–10.2)
Chloride: 100 mmol/L (ref 96–106)
Creatinine, Ser: 1.5 mg/dL — ABNORMAL HIGH (ref 0.76–1.27)
Glucose: 87 mg/dL (ref 70–99)
Potassium: 5.4 mmol/L — ABNORMAL HIGH (ref 3.5–5.2)
Sodium: 137 mmol/L (ref 134–144)
eGFR: 49 mL/min/1.73 — ABNORMAL LOW (ref >59)

## 2024-08-22 NOTE — Telephone Encounter (Signed)
 .  Patient Advocate Encounter   The patient was approved for a [employer] grant that will help cover the cost of ELIQUIS  Total amount awarded, 7500.  Effective: 07/23/24 - 07/22/25   APW:[id number] ERW:[id number] Hmnle:[id number] PI:[id number] [employer] ID: [id number]   Pharmacy provided with approval and processing information. Patient informed via mychart

## 2024-08-23 ENCOUNTER — Telehealth: Payer: Self-pay | Admitting: Pharmacy Technician

## 2024-08-23 ENCOUNTER — Other Ambulatory Visit (HOSPITAL_COMMUNITY): Payer: Self-pay

## 2024-08-23 NOTE — Telephone Encounter (Signed)
 Jeremy Reese

## 2024-09-03 ENCOUNTER — Ambulatory Visit: Payer: Self-pay | Admitting: Cardiovascular Disease

## 2024-10-17 ENCOUNTER — Ambulatory Visit: Payer: Medicare HMO | Admitting: Dermatology

## 2024-10-17 DIAGNOSIS — L821 Other seborrheic keratosis: Secondary | ICD-10-CM

## 2024-10-17 DIAGNOSIS — L820 Inflamed seborrheic keratosis: Secondary | ICD-10-CM

## 2024-10-17 DIAGNOSIS — S81811A Laceration without foreign body, right lower leg, initial encounter: Secondary | ICD-10-CM

## 2024-10-17 DIAGNOSIS — L409 Psoriasis, unspecified: Secondary | ICD-10-CM | POA: Diagnosis not present

## 2024-10-17 DIAGNOSIS — Z7189 Other specified counseling: Secondary | ICD-10-CM

## 2024-10-17 DIAGNOSIS — W908XXA Exposure to other nonionizing radiation, initial encounter: Secondary | ICD-10-CM | POA: Diagnosis not present

## 2024-10-17 DIAGNOSIS — D692 Other nonthrombocytopenic purpura: Secondary | ICD-10-CM | POA: Diagnosis not present

## 2024-10-17 DIAGNOSIS — L578 Other skin changes due to chronic exposure to nonionizing radiation: Secondary | ICD-10-CM | POA: Diagnosis not present

## 2024-10-17 DIAGNOSIS — Z79899 Other long term (current) drug therapy: Secondary | ICD-10-CM

## 2024-10-17 MED ORDER — CALCIPOTRIENE 0.005 % EX CREA: TOPICAL_CREAM | CUTANEOUS | 11 refills | Status: AC

## 2024-10-17 MED ORDER — MUPIROCIN 2 % EX OINT: TOPICAL_OINTMENT | CUTANEOUS | 2 refills | Status: AC

## 2024-10-17 MED ORDER — DOXYCYCLINE MONOHYDRATE 100 MG PO CAPS: 100.0000 mg | ORAL_CAPSULE | Freq: Two times a day (BID) | ORAL | 0 refills | Status: AC

## 2024-10-17 MED ORDER — MOMETASONE FUROATE 0.1 % EX CREA: TOPICAL_CREAM | CUTANEOUS | 6 refills | Status: AC

## 2024-10-17 NOTE — Progress Notes (Signed)
 Follow-Up Visit   Subjective  Jeremy Reese is a 73 y.o. male who presents for the following: here for psoriasis follow up. The patient has spots, moles and lesions to be evaluated, some may be new or changing and the patient may have concern these could be cancer.  The following portions of the chart were reviewed this encounter and updated as appropriate: medications, allergies, medical history  Review of Systems:  No other skin or systemic complaints except as noted in HPI or Assessment and Plan.  Objective  Well appearing patient in no apparent distress; mood and affect are within normal limits.  A focused examination was performed of the following areas: B/l legs ,   Relevant exam findings are noted in the Assessment and Plan.  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 1% BSA. Chronic condition with duration or expected duration over one year. Currently well-controlled. Psoriasis is a chronic non-curable, but treatable genetic/hereditary disease that may have other systemic features affecting other organ systems such as joints (Psoriatic Arthritis). It is associated with an increased risk of inflammatory bowel disease, heart disease, non-alcoholic fatty liver disease, and depression.  Treatments include light and laser treatments; topical medications; and systemic medications including oral and injectables. Treatment Plan: Continue Calcipotriene  cream to aa's QD 7d/wk PRN flares, and Mometasone  0.05% cream to aa's QD 5d/wk PRN.   Laceration from dog scratches on right lower leg  with background of edema of right lower leg from heart failure  Will treat to prevent cellulitis  Start mupirocin  2 % ointment apply topically to any crusted sores daily until healed Start doxycycline  100 mg capsule take 1 pill bid for 10 days with food  SEBORRHEIC KERATOSIS - Stuck-on, waxy, tan-brown  papules and/or plaques  - Benign-appearing - Discussed benign etiology and prognosis. - Observe - Call for any changes  Purpura - Chronic; persistent and recurrent.  Treatable, but not curable. - Violaceous macules and patches - Benign - Related to trauma, age, sun damage and/or use of blood thinners, chronic use of topical and/or oral steroids - Observe - Can use OTC arnica containing moisturizer such as Dermend Bruise Formula if desired - Call for worsening or other concerns  ACTINIC DAMAGE - chronic, secondary to cumulative UV radiation exposure/sun exposure over time - diffuse scaly erythematous macules with underlying dyspigmentation - Recommend daily broad spectrum sunscreen SPF 30+ to sun-exposed areas, reapply every 2 hours as needed.  - Recommend staying in the shade or wearing long sleeves, sun glasses (UVA+UVB protection) and wide brim hats (4-inch brim around the entire circumference of the hat). - Call for new or changing lesions.   PSORIASIS   Related Medications calcipotriene  (DOVONOX) 0.005 % cream Apply to aa's psoriasis QD PRN flares. mometasone  (ELOCON ) 0.1 % cream Apply to affected skin once a day 5 days a week LACERATION OF MULTIPLE SITES OF RIGHT LOWER EXTREMITY, INITIAL ENCOUNTER   Related Medications doxycycline  (MONODOX ) 100 MG capsule Take 1 capsule (100 mg total) by mouth 2 (two) times daily. Take with food mupirocin  ointment (BACTROBAN ) 2 % Apply topically to sores at lower legs daily and cover until healed PURPURA   ACTINIC SKIN DAMAGE   COUNSELING AND COORDINATION OF CARE   MEDICATION MANAGEMENT   SEBORRHEIC KERATOSIS   INFLAMED SEBORRHEIC KERATOSIS (4) left clavicle x 4 (4) Symptomatic, irritating, patient would like treated. Destruction of lesion - left clavicle x 4 (4)  Complexity: simple   Destruction method: cryotherapy   Informed consent: discussed and consent obtained   Timeout:  patient name, date of birth, surgical  site, and procedure verified Lesion destroyed using liquid nitrogen: Yes   Region frozen until ice ball extended beyond lesion: Yes   Outcome: patient tolerated procedure well with no complications   Post-procedure details: wound care instructions given     Return in about 1 year (around 10/17/2025) for psoriasis followup.  IEleanor Blush, CMA, am acting as scribe for Alm Rhyme, MD.   Documentation: I have reviewed the above documentation for accuracy and completeness, and I agree with the above.  Alm Rhyme, MD

## 2024-10-17 NOTE — Patient Instructions (Addendum)
 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 Do not lay down for 30 minutes after taking. Be cautious with sun exposure and use good sun protection while on this medication. Pregnant women should not take this medication.       Due to recent changes in healthcare laws, you may see results of your pathology and/or laboratory studies on MyChart before the doctors have had a chance to review them. We understand that in some cases there may be results that are confusing or concerning to you. Please understand that not all results are received at the same time and often the doctors may need to interpret multiple results in order to provide you with the best plan of care or course of treatment. Therefore, we ask that you please give us  2 business days to thoroughly review all your results before contacting the office for clarification. Should we see a critical lab result, you will be contacted sooner.   If You Need Anything After Your Visit  If you have any questions or concerns for your doctor, please call our main line at [phone] and press option 4 to reach your doctor's medical assistant. If no one answers, please leave a voicemail as directed and we will return your call as soon as possible. Messages left after 4 pm will be answered the following business day.   You may also send us  a message via MyChart. We typically respond to MyChart messages within 1-2 business days.  For prescription refills, please ask your pharmacy to contact our office. Our fax number is [phone].  If you have an urgent issue when the clinic is closed that cannot wait until the next business day,  you can page your doctor at the number below.    Please note that while we do our best to be available for urgent issues outside of office hours, we are not available 24/7.   If you have an urgent issue and are unable to reach us , you may choose to seek medical care at your doctor's office, retail clinic, urgent care center, or emergency room.  If you have a medical emergency, please immediately call 911 or go to the emergency department.  Pager Numbers  - Dr. Hester: [phone]  - Dr. Jackquline: [phone]  - Dr. Claudene: [phone]   - Dr. Raymund: [phone]  In the event of inclement weather, please call our main line at [phone] for an update on the status of any delays or closures.  Dermatology Medication Tips: Please keep the boxes that topical medications come in in order to help keep track of the instructions about where and how to use these. Pharmacies typically print the medication instructions only on the boxes and not directly on the medication tubes.   If your medication is too expensive, please contact our office at [phone] option 4 or send us  a message through MyChart.   We are unable to tell what your co-pay for medications will be in advance as this is different depending on your insurance coverage. However, we may be able to find a substitute medication at lower cost or fill out paperwork to get insurance  to cover a needed medication.   If a prior authorization is required to get your medication covered by your insurance company, please allow us  1-2 business days to complete this process.  Drug prices often vary depending on where the prescription is filled and some pharmacies may offer cheaper prices.  The website www.goodrx.com contains coupons for medications through different pharmacies. The prices here do not account for what the cost may be with help from insurance (it may be cheaper with your insurance), but the website can give you the price if  you did not use any insurance.  - You can print the associated coupon and take it with your prescription to the pharmacy.  - You may also stop by our office during regular business hours and pick up a GoodRx coupon card.  - If you need your prescription sent electronically to a different pharmacy, notify our office through [facility] or by phone at [phone] option 4.     Si Usted Necesita Algo Despus de Su Visita  Tambin puede enviarnos un mensaje a travs de Clinical Cytogeneticist. Por lo general respondemos a los mensajes de MyChart en el transcurso de 1 a 2 das hbiles.  Para renovar recetas, por favor pida a su farmacia que se ponga en contacto con nuestra oficina. Randi lakes de fax es [place] [phone].  Si tiene un asunto urgente cuando la clnica est cerrada y que no puede esperar hasta el siguiente da hbil, puede llamar/localizar a su doctor(a) al nmero que aparece a continuacin.   Por favor, tenga en cuenta que aunque hacemos todo lo posible para estar disponibles para asuntos urgentes fuera del horario de [place], no estamos disponibles las 24 horas del da, los 7 [address]  [address] de la [place].   Si tiene un problema urgente y no puede comunicarse con nosotros, puede optar por buscar atencin mdica  en el consultorio de su doctor(a), en una clnica privada, en un centro de atencin urgente o en una sala de emergencias.  Si tiene engineer, drilling, por favor llame inmediatamente al 911 o vaya a la sala de emergencias.  Nmeros de bper  - Dr. Hester: [phone]  - Dra. Jackquline: [phone]  - Dr. Claudene: [phone]  - Dra. Kitts: [phone]  En caso de inclemencias del [place], por favor llame a nuestra lnea principal al [phone] para una actualizacin sobre el estado de cualquier retraso o cierre.  Consejos para la medicacin en dermatologa: Por favor, guarde las cajas en las que vienen los medicamentos de uso tpico para ayudarle a seguir las instrucciones  sobre dnde y cmo usarlos. Las farmacias generalmente imprimen las instrucciones del medicamento slo en las cajas y no directamente en los tubos del [place].   Si su medicamento es muy caro, por favor, pngase en contacto con landry rieger llamando al [phone] y presione la opcin 4 o envenos un mensaje a travs de Clinical Cytogeneticist.   No podemos decirle cul ser su copago por los medicamentos por adelantado ya que esto es diferente dependiendo de la cobertura de su seguro. Sin embargo, es posible que podamos encontrar un medicamento sustituto a audiological scientist un formulario para que el seguro cubra el medicamento que se considera necesario.   Si se requiere una autorizacin previa para que su compaa de seguros cubra su medicamento, por favor permtanos de 1 a 2 das hbiles para completar este proceso.  Los precios de los medicamentos varan con frecuencia dependiendo del environmental consultant de dnde se surte la receta y  alguna farmacias pueden ofrecer precios ms baratos.  El sitio web www.goodrx.com tiene cupones para medicamentos de health and safety inspector. Los precios aqu no tienen en cuenta lo que podra costar con la ayuda del seguro (puede ser ms barato con su seguro), pero el sitio web puede darle el precio si no utiliz tourist information centre manager.  - Puede imprimir el cupn correspondiente y llevarlo con su receta a la farmacia.  - Tambin puede pasar por nuestra oficina durante el horario de atencin regular y education officer, museum una tarjeta de cupones de GoodRx.  - Si necesita que su receta se enve electrnicamente a una farmacia diferente, informe a nuestra oficina a travs de MyChart de [facility] o por telfono llamando al [phone] y presione la opcin 4.

## 2024-10-21 ENCOUNTER — Encounter: Payer: Self-pay | Admitting: Dermatology

## 2024-10-22 ENCOUNTER — Telehealth: Payer: Self-pay

## 2024-10-22 NOTE — Telephone Encounter (Signed)
 Called patient regarding his last visit to ask about spots treated at his visit. No answer. Lm for patient to return my call.

## 2024-11-05 ENCOUNTER — Ambulatory Visit

## 2024-11-12 ENCOUNTER — Telehealth: Payer: Self-pay

## 2024-11-12 ENCOUNTER — Other Ambulatory Visit: Payer: Self-pay | Admitting: Dermatology

## 2024-11-12 DIAGNOSIS — S81811A Laceration without foreign body, right lower leg, initial encounter: Secondary | ICD-10-CM

## 2024-11-12 NOTE — Telephone Encounter (Signed)
 Left message on voicemail to return my call about prescription refill request. Electronic request received from pharmacy for Doxycycline  100 mg po BID x 10 days, but that was sent in for lacerations on the legs and if he needs refills he should upload photos via MyChart of wounds to be reviewed, or schedule an appointment.

## 2024-11-20 ENCOUNTER — Encounter: Payer: Self-pay | Admitting: Dermatology

## 2024-11-20 ENCOUNTER — Ambulatory Visit: Admitting: Dermatology

## 2024-11-20 DIAGNOSIS — L817 Pigmented purpuric dermatosis: Secondary | ICD-10-CM

## 2024-11-20 DIAGNOSIS — L409 Psoriasis, unspecified: Secondary | ICD-10-CM

## 2024-11-20 DIAGNOSIS — L820 Inflamed seborrheic keratosis: Secondary | ICD-10-CM | POA: Diagnosis not present

## 2024-11-20 DIAGNOSIS — S81819A Laceration without foreign body, unspecified lower leg, initial encounter: Secondary | ICD-10-CM | POA: Diagnosis not present

## 2024-11-20 DIAGNOSIS — Z79899 Other long term (current) drug therapy: Secondary | ICD-10-CM

## 2024-11-20 DIAGNOSIS — Z7189 Other specified counseling: Secondary | ICD-10-CM

## 2024-11-20 DIAGNOSIS — I872 Venous insufficiency (chronic) (peripheral): Secondary | ICD-10-CM

## 2024-11-20 NOTE — Progress Notes (Signed)
 "  Follow-Up Visit   Subjective  Jeremy Reese is a 73 y.o. male who presents for the following: patient has a spot on his left cheek the he wants checked. Does not itch, is not painful. Recheck bilateral legs for wounds, currently treating with Calcipotriene  cream and Mometasone  0.05% cream. He did complete Doxycycline  and mupirocin  for his rt lower leg, symptoms improved.  The following portions of the chart were reviewed this encounter and updated as appropriate: medications, allergies, medical history  Review of Systems:  No other skin or systemic complaints except as noted in HPI or Assessment and Plan.  Objective  Well appearing patient in no apparent distress; mood and affect are within normal limits.    A focused examination was performed of the following areas: Face,arms, legs   Relevant exam findings are noted in the Assessment and Plan.  left proximal mandible x 1 Stuck-on, waxy, tan-brown papules and plaques -- Discussed benign etiology and prognosis.         Assessment & Plan  INFLAMED SEBORRHEIC KERATOSIS left proximal mandible x 1 ISK vs wart  Symptomatic, irritating, patient would like treated.  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 Chronic condition with duration or expected duration over one year. Currently well-controlled. Psoriasis is a chronic non-curable, but treatable genetic/hereditary disease that may have other systemic features affecting other organ systems such as  joints (Psoriatic Arthritis). It is associated with an increased risk of inflammatory bowel disease, heart disease, non-alcoholic fatty liver disease, and depression.  Treatments include light and laser treatments; topical medications; and systemic medications including oral and injectables. Treatment Plan: Continue Calcipotriene  cream to aa's QD 7d/wk PRN flares, and Mometasone  0.05% cream to aa's QD 5d/wk PRN.    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 Exam: Erythematous, scaly patches involving the ankle and distal lower leg with associated lower leg edema. Chronic and persistent condition with duration or expected duration over one year. Condition is symptomatic/ bothersome to patient. Not currently at goal. Stasis in the legs causes chronic leg swelling, which may result in itchy or painful rashes, skin discoloration, skin texture changes, and sometimes ulceration.  Recommend daily graduated compression hose/stockings- easiest to put on first thing in morning, remove at bedtime.  Elevate legs as much as possible. Avoid salt/sodium rich foods. Treatment Plan: Applied compression wrap to bilateral bilateral lower legs today in order to decrease edema. He should continue Lasix  prescribed by his primary care physician to decrease leg edema.  He does not like to take this due to making him have to go to the bathroom a lot. Legs cleansed with Puracyn, applied Mupirocin  to sores/crusts on legs, applied sample Zoryve cream all over lower legs. Legs wrapped with Coban wrap in office today Recommend graduated compression stockings daily.  Suggested patient get a donning frame for his graduated compression stockings.  Will make it easier to put on.  Recent lacerations from dog scratches on the lower legs  Much improved after oral antibiotics doxycycline  and topical mupirocin  to decrease risk of cellulitis or  impending cellulitis.  Return in about 2 months (around 01/21/2025) for stasis dermatitis  .  IFay Kirks, CMA, am acting as scribe for Alm Rhyme, MD .   Documentation: I have reviewed the above documentation for accuracy and completeness, and I agree with the above.  Alm Rhyme, MD    "

## 2024-11-20 NOTE — Patient Instructions (Addendum)

## 2025-01-14 ENCOUNTER — Ambulatory Visit: Admitting: Dermatology

## 2025-02-20 ENCOUNTER — Ambulatory Visit: Admitting: Dermatology

## 2025-07-02 ENCOUNTER — Encounter (INDEPENDENT_AMBULATORY_CARE_PROVIDER_SITE_OTHER)

## 2025-07-02 ENCOUNTER — Ambulatory Visit (INDEPENDENT_AMBULATORY_CARE_PROVIDER_SITE_OTHER): Admitting: Vascular Surgery

## 2025-10-21 ENCOUNTER — Ambulatory Visit: Admitting: Dermatology
